# Patient Record
Sex: Female | Born: 1950 | Race: White | Hispanic: No | Smoking: Former smoker
Health system: Southern US, Community
[De-identification: ages and names within clinical notes are randomized; demographics above are authoritative.]

## PROBLEM LIST (undated history)

## (undated) DIAGNOSIS — K5792 Diverticulitis of intestine, part unspecified, without perforation or abscess without bleeding: Secondary | ICD-10-CM

## (undated) DIAGNOSIS — I739 Peripheral vascular disease, unspecified: Secondary | ICD-10-CM

## (undated) DIAGNOSIS — I5033 Acute on chronic diastolic (congestive) heart failure: Secondary | ICD-10-CM

## (undated) DIAGNOSIS — J449 Chronic obstructive pulmonary disease, unspecified: Secondary | ICD-10-CM

## (undated) DIAGNOSIS — Z902 Acquired absence of lung [part of]: Secondary | ICD-10-CM

## (undated) DIAGNOSIS — I10 Essential (primary) hypertension: Secondary | ICD-10-CM

## (undated) DIAGNOSIS — Z9289 Personal history of other medical treatment: Secondary | ICD-10-CM

## (undated) DIAGNOSIS — Z720 Tobacco use: Secondary | ICD-10-CM

## (undated) DIAGNOSIS — J45909 Unspecified asthma, uncomplicated: Secondary | ICD-10-CM

## (undated) DIAGNOSIS — E785 Hyperlipidemia, unspecified: Secondary | ICD-10-CM

## (undated) DIAGNOSIS — I214 Non-ST elevation (NSTEMI) myocardial infarction: Secondary | ICD-10-CM

## (undated) DIAGNOSIS — M797 Fibromyalgia: Secondary | ICD-10-CM

## (undated) DIAGNOSIS — G458 Other transient cerebral ischemic attacks and related syndromes: Secondary | ICD-10-CM

## (undated) DIAGNOSIS — N824 Other female intestinal-genital tract fistulae: Secondary | ICD-10-CM

## (undated) DIAGNOSIS — I639 Cerebral infarction, unspecified: Secondary | ICD-10-CM

## (undated) DIAGNOSIS — I2510 Atherosclerotic heart disease of native coronary artery without angina pectoris: Secondary | ICD-10-CM

## (undated) DIAGNOSIS — K567 Ileus, unspecified: Secondary | ICD-10-CM

## (undated) DIAGNOSIS — Z9889 Other specified postprocedural states: Secondary | ICD-10-CM

## (undated) DIAGNOSIS — K219 Gastro-esophageal reflux disease without esophagitis: Secondary | ICD-10-CM

## (undated) DIAGNOSIS — R9430 Abnormal result of cardiovascular function study, unspecified: Secondary | ICD-10-CM

## (undated) DIAGNOSIS — E119 Type 2 diabetes mellitus without complications: Secondary | ICD-10-CM

## (undated) DIAGNOSIS — F419 Anxiety disorder, unspecified: Secondary | ICD-10-CM

## (undated) DIAGNOSIS — R112 Nausea with vomiting, unspecified: Secondary | ICD-10-CM

## (undated) HISTORY — PX: CORONARY ANGIOPLASTY: SHX604

## (undated) HISTORY — DX: Non-ST elevation (NSTEMI) myocardial infarction: I21.4

## (undated) HISTORY — PX: BACK SURGERY: SHX140

## (undated) HISTORY — DX: Other transient cerebral ischemic attacks and related syndromes: G45.8

## (undated) HISTORY — DX: Acute on chronic diastolic (congestive) heart failure: I50.33

## (undated) HISTORY — PX: OTHER SURGICAL HISTORY: SHX169

## (undated) HISTORY — DX: Ileus, unspecified: K56.7

## (undated) HISTORY — DX: Other female intestinal-genital tract fistulae: N82.4

## (undated) HISTORY — PX: CARPAL TUNNEL RELEASE: SHX101

## (undated) HISTORY — PX: ABDOMINAL HYSTERECTOMY: SHX81

## (undated) HISTORY — PX: LUMBAR SPINE SURGERY: SHX701

---

## 1995-05-03 DIAGNOSIS — Z902 Acquired absence of lung [part of]: Secondary | ICD-10-CM

## 1995-05-03 HISTORY — DX: Acquired absence of lung (part of): Z90.2

## 1996-05-02 HISTORY — PX: OTHER SURGICAL HISTORY: SHX169

## 1997-09-24 ENCOUNTER — Ambulatory Visit (HOSPITAL_COMMUNITY): Admission: RE | Admit: 1997-09-24 | Discharge: 1997-09-24 | Payer: Self-pay | Admitting: Neurosurgery

## 2001-01-02 ENCOUNTER — Ambulatory Visit (HOSPITAL_COMMUNITY): Admission: RE | Admit: 2001-01-02 | Discharge: 2001-01-02 | Payer: Self-pay | Admitting: Family Medicine

## 2001-01-02 ENCOUNTER — Encounter: Payer: Self-pay | Admitting: Family Medicine

## 2002-05-02 DIAGNOSIS — G458 Other transient cerebral ischemic attacks and related syndromes: Secondary | ICD-10-CM

## 2002-05-02 HISTORY — DX: Other transient cerebral ischemic attacks and related syndromes: G45.8

## 2002-05-16 ENCOUNTER — Encounter: Payer: Self-pay | Admitting: Emergency Medicine

## 2002-05-16 ENCOUNTER — Inpatient Hospital Stay (HOSPITAL_COMMUNITY): Admission: EM | Admit: 2002-05-16 | Discharge: 2002-05-17 | Payer: Self-pay | Admitting: Emergency Medicine

## 2002-05-17 ENCOUNTER — Encounter: Payer: Self-pay | Admitting: Pediatrics

## 2002-06-07 ENCOUNTER — Ambulatory Visit (HOSPITAL_COMMUNITY): Admission: RE | Admit: 2002-06-07 | Discharge: 2002-06-07 | Payer: Self-pay | Admitting: Vascular Surgery

## 2002-06-10 ENCOUNTER — Inpatient Hospital Stay (HOSPITAL_COMMUNITY): Admission: RE | Admit: 2002-06-10 | Discharge: 2002-06-12 | Payer: Self-pay | Admitting: Vascular Surgery

## 2005-02-28 ENCOUNTER — Encounter: Admission: RE | Admit: 2005-02-28 | Discharge: 2005-02-28 | Payer: Self-pay | Admitting: Neurosurgery

## 2005-05-19 ENCOUNTER — Ambulatory Visit (HOSPITAL_COMMUNITY): Admission: RE | Admit: 2005-05-19 | Discharge: 2005-05-19 | Payer: Self-pay | Admitting: Family Medicine

## 2005-07-25 ENCOUNTER — Ambulatory Visit (HOSPITAL_COMMUNITY): Admission: RE | Admit: 2005-07-25 | Discharge: 2005-07-25 | Payer: Self-pay | Admitting: Family Medicine

## 2005-08-21 ENCOUNTER — Emergency Department (HOSPITAL_COMMUNITY): Admission: EM | Admit: 2005-08-21 | Discharge: 2005-08-21 | Payer: Self-pay | Admitting: Emergency Medicine

## 2006-05-19 ENCOUNTER — Inpatient Hospital Stay (HOSPITAL_COMMUNITY): Admission: EM | Admit: 2006-05-19 | Discharge: 2006-05-22 | Payer: Self-pay | Admitting: Emergency Medicine

## 2012-08-04 DIAGNOSIS — K5792 Diverticulitis of intestine, part unspecified, without perforation or abscess without bleeding: Secondary | ICD-10-CM

## 2012-08-04 HISTORY — DX: Diverticulitis of intestine, part unspecified, without perforation or abscess without bleeding: K57.92

## 2012-08-06 ENCOUNTER — Other Ambulatory Visit: Payer: Self-pay | Admitting: Physician Assistant

## 2012-08-06 DIAGNOSIS — I214 Non-ST elevation (NSTEMI) myocardial infarction: Secondary | ICD-10-CM

## 2012-08-07 ENCOUNTER — Other Ambulatory Visit: Payer: Self-pay | Admitting: Physician Assistant

## 2012-08-07 ENCOUNTER — Encounter (HOSPITAL_COMMUNITY): Payer: Self-pay | Admitting: Pulmonary Disease

## 2012-08-07 ENCOUNTER — Inpatient Hospital Stay (HOSPITAL_COMMUNITY): Payer: Medicare Other

## 2012-08-07 ENCOUNTER — Inpatient Hospital Stay (HOSPITAL_COMMUNITY)
Admission: AD | Admit: 2012-08-07 | Discharge: 2012-08-12 | DRG: 871 | Disposition: A | Discharge status: Home / Self Care | Payer: Medicare Other | Source: Other Acute Inpatient Hospital | Attending: Internal Medicine | Admitting: Internal Medicine

## 2012-08-07 DIAGNOSIS — Z880 Allergy status to penicillin: Secondary | ICD-10-CM

## 2012-08-07 DIAGNOSIS — N170 Acute kidney failure with tubular necrosis: Secondary | ICD-10-CM | POA: Diagnosis present

## 2012-08-07 DIAGNOSIS — Z8673 Personal history of transient ischemic attack (TIA), and cerebral infarction without residual deficits: Secondary | ICD-10-CM

## 2012-08-07 DIAGNOSIS — I369 Nonrheumatic tricuspid valve disorder, unspecified: Secondary | ICD-10-CM

## 2012-08-07 DIAGNOSIS — N179 Acute kidney failure, unspecified: Secondary | ICD-10-CM | POA: Diagnosis present

## 2012-08-07 DIAGNOSIS — N10 Acute tubulo-interstitial nephritis: Secondary | ICD-10-CM | POA: Diagnosis present

## 2012-08-07 DIAGNOSIS — I214 Non-ST elevation (NSTEMI) myocardial infarction: Secondary | ICD-10-CM | POA: Diagnosis present

## 2012-08-07 DIAGNOSIS — R6521 Severe sepsis with septic shock: Secondary | ICD-10-CM

## 2012-08-07 DIAGNOSIS — J4489 Other specified chronic obstructive pulmonary disease: Secondary | ICD-10-CM | POA: Diagnosis present

## 2012-08-07 DIAGNOSIS — D649 Anemia, unspecified: Secondary | ICD-10-CM | POA: Diagnosis present

## 2012-08-07 DIAGNOSIS — N1330 Unspecified hydronephrosis: Secondary | ICD-10-CM | POA: Diagnosis present

## 2012-08-07 DIAGNOSIS — R6520 Severe sepsis without septic shock: Secondary | ICD-10-CM | POA: Diagnosis present

## 2012-08-07 DIAGNOSIS — Z8711 Personal history of peptic ulcer disease: Secondary | ICD-10-CM

## 2012-08-07 DIAGNOSIS — E871 Hypo-osmolality and hyponatremia: Secondary | ICD-10-CM | POA: Diagnosis present

## 2012-08-07 DIAGNOSIS — Z9861 Coronary angioplasty status: Secondary | ICD-10-CM

## 2012-08-07 DIAGNOSIS — I248 Other forms of acute ischemic heart disease: Secondary | ICD-10-CM | POA: Diagnosis present

## 2012-08-07 DIAGNOSIS — R404 Transient alteration of awareness: Secondary | ICD-10-CM | POA: Diagnosis present

## 2012-08-07 DIAGNOSIS — I129 Hypertensive chronic kidney disease with stage 1 through stage 4 chronic kidney disease, or unspecified chronic kidney disease: Secondary | ICD-10-CM | POA: Diagnosis present

## 2012-08-07 DIAGNOSIS — E119 Type 2 diabetes mellitus without complications: Secondary | ICD-10-CM | POA: Diagnosis present

## 2012-08-07 DIAGNOSIS — I739 Peripheral vascular disease, unspecified: Secondary | ICD-10-CM | POA: Diagnosis present

## 2012-08-07 DIAGNOSIS — D638 Anemia in other chronic diseases classified elsewhere: Secondary | ICD-10-CM | POA: Diagnosis present

## 2012-08-07 DIAGNOSIS — Z902 Acquired absence of lung [part of]: Secondary | ICD-10-CM

## 2012-08-07 DIAGNOSIS — I2489 Other forms of acute ischemic heart disease: Secondary | ICD-10-CM | POA: Diagnosis present

## 2012-08-07 DIAGNOSIS — M545 Low back pain, unspecified: Secondary | ICD-10-CM | POA: Diagnosis present

## 2012-08-07 DIAGNOSIS — IMO0001 Reserved for inherently not codable concepts without codable children: Secondary | ICD-10-CM | POA: Diagnosis present

## 2012-08-07 DIAGNOSIS — Z79899 Other long term (current) drug therapy: Secondary | ICD-10-CM

## 2012-08-07 DIAGNOSIS — Z882 Allergy status to sulfonamides status: Secondary | ICD-10-CM

## 2012-08-07 DIAGNOSIS — I519 Heart disease, unspecified: Secondary | ICD-10-CM | POA: Diagnosis present

## 2012-08-07 DIAGNOSIS — N200 Calculus of kidney: Secondary | ICD-10-CM | POA: Diagnosis present

## 2012-08-07 DIAGNOSIS — J449 Chronic obstructive pulmonary disease, unspecified: Secondary | ICD-10-CM | POA: Diagnosis present

## 2012-08-07 DIAGNOSIS — N189 Chronic kidney disease, unspecified: Secondary | ICD-10-CM | POA: Diagnosis present

## 2012-08-07 DIAGNOSIS — G8929 Other chronic pain: Secondary | ICD-10-CM | POA: Diagnosis present

## 2012-08-07 DIAGNOSIS — E669 Obesity, unspecified: Secondary | ICD-10-CM | POA: Diagnosis present

## 2012-08-07 DIAGNOSIS — Z6830 Body mass index (BMI) 30.0-30.9, adult: Secondary | ICD-10-CM

## 2012-08-07 DIAGNOSIS — J9600 Acute respiratory failure, unspecified whether with hypoxia or hypercapnia: Secondary | ICD-10-CM | POA: Diagnosis present

## 2012-08-07 DIAGNOSIS — Z7982 Long term (current) use of aspirin: Secondary | ICD-10-CM

## 2012-08-07 DIAGNOSIS — A419 Sepsis, unspecified organism: Principal | ICD-10-CM | POA: Diagnosis present

## 2012-08-07 DIAGNOSIS — I2510 Atherosclerotic heart disease of native coronary artery without angina pectoris: Secondary | ICD-10-CM | POA: Diagnosis present

## 2012-08-07 DIAGNOSIS — K219 Gastro-esophageal reflux disease without esophagitis: Secondary | ICD-10-CM | POA: Diagnosis present

## 2012-08-07 DIAGNOSIS — F17200 Nicotine dependence, unspecified, uncomplicated: Secondary | ICD-10-CM | POA: Diagnosis present

## 2012-08-07 DIAGNOSIS — K5732 Diverticulitis of large intestine without perforation or abscess without bleeding: Secondary | ICD-10-CM | POA: Diagnosis present

## 2012-08-07 HISTORY — DX: Chronic obstructive pulmonary disease, unspecified: J44.9

## 2012-08-07 HISTORY — DX: Hyperlipidemia, unspecified: E78.5

## 2012-08-07 HISTORY — DX: Non-ST elevation (NSTEMI) myocardial infarction: I21.4

## 2012-08-07 HISTORY — DX: Tobacco use: Z72.0

## 2012-08-07 HISTORY — DX: Cerebral infarction, unspecified: I63.9

## 2012-08-07 HISTORY — DX: Essential (primary) hypertension: I10

## 2012-08-07 HISTORY — DX: Fibromyalgia: M79.7

## 2012-08-07 HISTORY — DX: Peripheral vascular disease, unspecified: I73.9

## 2012-08-07 HISTORY — DX: Atherosclerotic heart disease of native coronary artery without angina pectoris: I25.10

## 2012-08-07 HISTORY — DX: Acquired absence of lung (part of): Z90.2

## 2012-08-07 HISTORY — DX: Diverticulitis of intestine, part unspecified, without perforation or abscess without bleeding: K57.92

## 2012-08-07 LAB — RENAL FUNCTION PANEL
BUN: 58 mg/dL — ABNORMAL HIGH (ref 6–23)
CO2: 15 mEq/L — ABNORMAL LOW (ref 19–32)
Calcium: 7 mg/dL — ABNORMAL LOW (ref 8.4–10.5)
Creatinine, Ser: 4.69 mg/dL — ABNORMAL HIGH (ref 0.50–1.10)
GFR calc Af Amer: 10 mL/min — ABNORMAL LOW (ref >90)
Glucose, Bld: 95 mg/dL (ref 70–99)
Phosphorus: 7 mg/dL — ABNORMAL HIGH (ref 2.3–4.6)
Sodium: 133 mEq/L — ABNORMAL LOW (ref 135–145)

## 2012-08-07 LAB — CBC
HCT: 31.8 % — ABNORMAL LOW (ref 36.0–46.0)
Hemoglobin: 10.5 g/dL — ABNORMAL LOW (ref 12.0–15.0)
MCH: 31.5 pg (ref 26.0–34.0)
MCHC: 33 g/dL (ref 30.0–36.0)
MCV: 95.5 fL (ref 78.0–100.0)
RDW: 14.7 % (ref 11.5–15.5)

## 2012-08-07 LAB — SODIUM, URINE, RANDOM: Sodium, Ur: 53 mEq/L

## 2012-08-07 LAB — VANCOMYCIN, RANDOM: Vancomycin Rm: 15.8 ug/mL

## 2012-08-07 LAB — HEPARIN LEVEL (UNFRACTIONATED): Heparin Unfractionated: 0.19 IU/mL — ABNORMAL LOW (ref 0.30–0.70)

## 2012-08-07 MED ORDER — SODIUM CHLORIDE 0.45 % IV SOLN
INTRAVENOUS | Status: DC
  Administered 2012-08-07: 13:00:00 via INTRAVENOUS

## 2012-08-07 MED ORDER — IPRATROPIUM BROMIDE 0.02 % IN SOLN
0.5000 mg | Freq: Four times a day (QID) | RESPIRATORY_TRACT | Status: DC
  Administered 2012-08-07 – 2012-08-10 (×10): 0.5 mg via RESPIRATORY_TRACT
  Filled 2012-08-07 (×11): qty 2.5

## 2012-08-07 MED ORDER — INSULIN ASPART 100 UNIT/ML ~~LOC~~ SOLN: 0.0000 [IU] | SUBCUTANEOUS | Status: DC

## 2012-08-07 MED ORDER — SODIUM BICARBONATE 8.4 % IV SOLN
INTRAVENOUS | Status: DC
  Administered 2012-08-07 – 2012-08-08 (×2): via INTRAVENOUS
  Filled 2012-08-07 (×3): qty 150

## 2012-08-07 MED ORDER — SIMVASTATIN 20 MG PO TABS
20.0000 mg | ORAL_TABLET | Freq: Every day | ORAL | Status: DC
  Administered 2012-08-07 – 2012-08-11 (×5): 20 mg via ORAL
  Filled 2012-08-07 (×7): qty 1

## 2012-08-07 MED ORDER — ONDANSETRON HCL 4 MG/2ML IJ SOLN: 4.0000 mg | Freq: Four times a day (QID) | INTRAMUSCULAR | Status: DC | PRN

## 2012-08-07 MED ORDER — ASPIRIN EC 325 MG PO TBEC
325.0000 mg | DELAYED_RELEASE_TABLET | Freq: Every day | ORAL | Status: DC
  Administered 2012-08-08 – 2012-08-12 (×5): 325 mg via ORAL
  Filled 2012-08-07 (×5): qty 1

## 2012-08-07 MED ORDER — PANTOPRAZOLE SODIUM 40 MG IV SOLR
40.0000 mg | INTRAVENOUS | Status: DC
  Administered 2012-08-08: 40 mg via INTRAVENOUS
  Filled 2012-08-07 (×3): qty 40

## 2012-08-07 MED ORDER — ACETAMINOPHEN 325 MG PO TABS
650.0000 mg | ORAL_TABLET | ORAL | Status: DC | PRN
  Administered 2012-08-08 – 2012-08-10 (×6): 650 mg via ORAL
  Filled 2012-08-07 (×3): qty 2

## 2012-08-07 MED ORDER — HEPARIN (PORCINE) IN NACL 100-0.45 UNIT/ML-% IJ SOLN
2000.0000 [IU]/h | INTRAMUSCULAR | Status: DC
  Administered 2012-08-07: 1200 [IU]/h via INTRAVENOUS
  Administered 2012-08-08: 1600 [IU]/h via INTRAVENOUS
  Filled 2012-08-07 (×8): qty 250

## 2012-08-07 MED ORDER — SODIUM CHLORIDE 0.9 % IV SOLN
1.0000 g | INTRAVENOUS | Status: DC
  Administered 2012-08-07: 1 g via INTRAVENOUS
  Filled 2012-08-07 (×2): qty 1

## 2012-08-07 MED ORDER — MORPHINE SULFATE 2 MG/ML IJ SOLN
1.0000 mg | INTRAMUSCULAR | Status: DC | PRN
  Filled 2012-08-07: qty 1

## 2012-08-07 MED ORDER — METOPROLOL TARTRATE 25 MG PO TABS
25.0000 mg | ORAL_TABLET | Freq: Two times a day (BID) | ORAL | Status: DC
  Filled 2012-08-07 (×2): qty 1

## 2012-08-07 MED ORDER — ALBUTEROL SULFATE (5 MG/ML) 0.5% IN NEBU
2.5000 mg | INHALATION_SOLUTION | Freq: Four times a day (QID) | RESPIRATORY_TRACT | Status: DC
  Administered 2012-08-07 – 2012-08-10 (×10): 2.5 mg via RESPIRATORY_TRACT
  Filled 2012-08-07 (×11): qty 0.5

## 2012-08-07 MED ORDER — MORPHINE SULFATE 2 MG/ML IJ SOLN
1.0000 mg | INTRAMUSCULAR | Status: DC | PRN
  Administered 2012-08-07: 1 mg via INTRAVENOUS
  Administered 2012-08-08 – 2012-08-09 (×4): 2 mg via INTRAVENOUS
  Filled 2012-08-07 (×6): qty 1

## 2012-08-07 MED ORDER — NITROGLYCERIN 0.4 MG SL SUBL: 0.4000 mg | SUBLINGUAL_TABLET | SUBLINGUAL | Status: DC | PRN

## 2012-08-07 MED ORDER — CIPROFLOXACIN HCL 500 MG PO TABS
500.0000 mg | ORAL_TABLET | Freq: Two times a day (BID) | ORAL | Status: DC
  Filled 2012-08-07 (×2): qty 1

## 2012-08-07 MED ORDER — HEPARIN BOLUS VIA INFUSION
1500.0000 [IU] | Freq: Once | INTRAVENOUS | Status: AC
  Administered 2012-08-07: 1500 [IU] via INTRAVENOUS
  Filled 2012-08-07: qty 1500

## 2012-08-07 NOTE — H&P (Signed)
See Cardiology Consult Note dated 08/06/12 in the shadow chart. This is a 62 y/o F with history of CAD with remote angioplasty, history of CVA/TIA, DM, HTN, HL who was admitted at [facility] 08/05/12 with flank pain & acute renal failure and underwent recent placement of R ureteral stent for treatment of recurrent nephrolithiasis. She subsequently developed a hypoxemia requiring bipap. Cardiac markers showed elevated troponin of 3.3->4.1, with EKG showing some ST depression. She has had low blood pressures as well. She was started on ASA, heparin, BB as blood pressure allows, and statin. She was subsequently transferred to [facility] for ICU care including respiratory management, hypotension, and acute renal failure. She was started on antibiotics at [facility] before arrival for ?sepsis given hypotension, also ?of cardiac involvement. Patient received to 2100 in stable condition.  PCCM has been consulted for assistance. Renal consult requested - BUN/Cr 49/4.5 at [facility] today previously 34/3.3 (was 1.4 in the past). Per today's cardiology rounding note, her echo was done at [facility] and is pending at this time.  Orders written by the [facility] team that saw her today. Javionna Leder PA-C

## 2012-08-07 NOTE — Progress Notes (Signed)
Came to evaluate patient for possible delirium. Pt trying to get out of bed.  Vitals stable.  A&O to place and person. Describes pain in her back and discomfort in the bed.  A:Possible mild ICU delirium 2/2 multiple medical/surgical problems, recent urologic procedure and possible pain from it.  Plan: will give IV morphine- 1-2 mg q3 hrs PRN for pain. - If she gets worse- will consider BZD's as her Qtc is 488 on monitor.

## 2012-08-07 NOTE — Consult Note (Addendum)
Requesting Physician:  [facility] Reason for Consult:  Acute kidney injury HPI: The patient is a 62 y.o. year-old transferred from [facility] for further management of AKI and NSTEMI.  She has a background of NIDDM, HTN, hyperlipidemia, prior CVA'swith no residual, PAD with prior left carotid to subclavian anastamosis in 2004 [facility]) for subclavian steal.    She presented to [facility] on 08/05/12 with acute right flank pain and a creatinine on 1.4 (historical creatinines prior to this not known).  She was found to have right hydronephrosis and underwent placement of a right ureteral stent, bypassing 2 stones, with prurulent urine behind the stones.  Post procedure she developed respiratory distress requiring BIPAP, ECG changes, and troponin elevation consistent with NSTEMI.    She received several fluid boluses for hypotension.  Was on ARB which was discontinued.  Creatinine steadily rose despite ureteral stenting, and was up to 4.5 today, with a bicarb of 16, pH of 7.22, Hb down into the 9's from around 12.5, WBC up to 29,800 . Received several fluid boluses for oliguria and hypotension.  Was treated with 1 dose of vanco, rocephin for 2 days, meropenem and cipro.  No urine cultures were done. Transferred here for further management  Family states pt has never seen a urologist (although had appt last year with [facility] that she did not keep) or a nephrologist.   Past Medical History:  Past Medical History  Diagnosis Date  . CAD (coronary artery disease)   . PVD (peripheral vascular disease)   . CVA (cerebral infarction)     TIAx2  . HTN (hypertension)   . HLD (hyperlipidemia)   . Tobacco abuse   . Diverticulitis 08/04/12  . S/P partial lobectomy of lung 1997    per [name], incidental finding, path c/w benign lesion    Past Surgical History:  Past Surgical History  Procedure Laterality Date  . Hysterectomoy    . Lumbar spine surgery    . Carpal tunnel release      Family  History: No family history on file. Mother died when pt was 62 years old of some sort of kidney problem Social History:  reports that she has been smoking Cigarettes.  She has a 49 pack-year smoking history. She does not have any smokeless tobacco history on file. Her alcohol and drug histories are not on file.  Allergies:  Allergies  Allergen Reactions  . Penicillins Rash  . Sulfa Antibiotics Rash    Home medications: Prior to Admission medications   Medication Sig Start Date End Date Taking? Authorizing Provider  fenofibrate 160 MG tablet TAKE ONE TABLET BY MOUTH ONE TIME DAILY 08/06/12   Ileana Ladd, MD  Cozaar 100 mg/day ASA 325/day Omeprazole 20 mg/day Pravachol 40 mg Qhs  Inpatient medications: . albuterol  2.5 mg Nebulization Q6H  . [START ON 08/08/2012] aspirin EC  325 mg Oral Daily  . insulin aspart  0-9 Units Subcutaneous Q4H  . ipratropium  0.5 mg Nebulization Q6H  . meropenem (MERREM) IV  1 g Intravenous QAC supper  . metoprolol tartrate  25 mg Oral BID  . [START ON 08/08/2012] pantoprazole (PROTONIX) IV  40 mg Intravenous Q24H  . simvastatin  20 mg Oral q1800    Review of Systems Somewhat difficult due to patient discomfort Denies chills, dysuria or change in UOP PTA Flank pain present on adm to [facility] has improved  Dyspneic No abdominal pain, diarrhea or constipation No chronic or acute le swelling No tea or coca  colored or bloody urine  Physical Exam:  Blood pressure 93/64, pulse 56, temperature 97.4 F (36.3 C), temperature source Oral, height 5\' 1"  (1.549 m), weight 68.1 kg (150 lb 2.1 oz).  Gen: Uncomfortable app WF Somewhat hyperpneic Skin: warm, dry Neck veins difficult to visualize Chest: Expiratory wheezes, no definite crackles Heart: regular, s1s2 no audible s3 Abdomen: soft, no focal abdominal tenderness Ext: no appreciable LE edema Neuro: awake, oriented X 3  Keeps eyes close most of the time and doesn't make much eye contact  Labs - all  pending from this admission  From [facility] today: Na 136   K 5.0 Cl 107 CO2 16 BUN 49 Creatinine 4.5  (baseline 4/6 1.4) Lactic acid 1.9 Trop 4.11 WBC 29,800 Hb 9.7 Hct 30.5 Plts 131 ABG 4/7  7.27/102/31 (BIPAP 10/5 3 LPM)  Impression/Plan 62 y.o. year-old WF with a background of NIDDM, HTN, hyperlipidemia, prior CVA'swith no residual, PAD with prior left carotid to subclavian anastamosis in 2004 [facility]) for subclavian steal who presented to [facility] on 08/05/12 with acute right flank pain and a creatinine on 1.4 (historical creatinines prior to this not known).  Right hydronephrosis was treated with ureteral stenting,  bypassing 2 stones, with prurulent urine behind the stones.  Post procedure she developed respiratory distress requiring BIPAP, ECG changes, and troponin elevation consistent with NSTEMI, hypotension (sepsis vs cardiac).  Was on ARB PTA.  Has developed acute renal failure and metabolic acidosis in this setting with creatinine up to 4.5 today  AKI S/p ureteral stent for right hydro Baseline prior to procedures 1.4   More historical creatinines not known Hypotension, outpt ARB, acute NSTEMI with resp failure contributing to hemodynamic component Oliguric Intravascular volume difficult to assess Hyperpneic secondary to anion gap metabolic acidosis No indications as of yet for acute dialysis  REC: Renal ultrasound to assess whether stent has relieved obstruction Urine pro/creat/Na Change IVF to isotonic sodium bicarbonate May require RRT in the next 24 hours if UOP/acidosis/BP do not respond to aggressive Rx with fluids, bicarb, antibiotics. May need central line to assist with volume assessment/monitoring  Thanks for the consult. Will follow with you.   [name],  MD [facility] [phone] pager 08/07/2012, 2:51 PM

## 2012-08-07 NOTE — Progress Notes (Signed)
  Echocardiogram 2D Echocardiogram has been performed.  Georgian Co 08/07/2012, 4:07 PM

## 2012-08-07 NOTE — Progress Notes (Signed)
ANTICOAGULATION CONSULT NOTE - Initial Consult  Pharmacy Consult for Heparin Indication: chest pain/ACS  Allergies  Allergen Reactions  . Penicillins Rash  . Sulfa Antibiotics Rash    Patient Measurements: Height: 5\' 1"  (154.9 cm) Weight: 150 lb 2.1 oz (68.1 kg) IBW/kg (Calculated) : 47.8 Heparin Dosing Weight: 62.3 kg  Vital Signs: Temp: 97.4 F (36.3 C) (04/08 1300) Temp src: Oral (04/08 1300) BP: 91/75 mmHg (04/08 1238)  Labs: No results found for this basename: HGB, HCT, PLT, APTT, LABPROT, INR, HEPARINUNFRC, CREATININE, CKTOTAL, CKMB, TROPONINI,  in the last 72 hours  CrCl is unknown because no creatinine reading has been taken.   Medical History: No past medical history on file.  Medications:  Heparin @ 1200 units/hr  Assessment: 62 y.o. F transferred from [facility] to [facility] with heparin drip already infusing for CP/ACS sx. This morning the patient had a SUBtherapeutic heparin level of 0.19 and around 0830 orders were written for a heparin 1000 unit bolus x 1 followed by an increase in the drip rate to 1200 units/hr (12 ml/hr). Will continue the heparin drip at the current drip rate for now and will enter a heparin level to be drawn around 1700 today (~ 8 hours after bolus and adjustment in drip rate at [facility]).   Goal of Therapy:  Heparin level 0.3-0.7 units/ml Monitor platelets by anticoagulation protocol: Yes   Plan:  1. Continue heparin drip at current rate of 1200 units/hr (12 ml/hr) 2. Daily heparin levels 3. Will continue to monitor for any signs/symptoms of bleeding and will follow up with heparin level in 8 hours after the drip was increased this morning (around 1700 today)  Georgina Pillion, PharmD, BCPS Clinical Pharmacist Pager: [phone] 08/07/2012 1:30 PM

## 2012-08-07 NOTE — Consult Note (Addendum)
PULMONARY  / CRITICAL CARE MEDICINE  Name: Alexa Key MRN: [id number] DOB: 20-Jun-1950    ADMISSION DATE:  08/07/2012 CONSULTATION DATE:  08/07/2012  REFERRING MD :  [name] PRIMARY SERVICE: Bensimhon  CHIEF COMPLAINT:  Sepsis, renal failure  BRIEF PATIENT DESCRIPTION: 62 y/o F admitted to [facility] hospital on 4/5 with lower abdominal pain and UTI.  Found to have diverticulitis on abdominal CT and question of renal calculi and R hydronephrosis.  Underwent ureteral stent placement 4/7.  Post procedure was found to have hypoxia / resp distress requiring bipap, acute renal failure & elevated troponin c/w NSTEMI. Tx to [facility] on 4/8 in setting of NSTEMI, acute renal failure & resp distress.   SIGNIFICANT EVENTS / STUDIES:  4/05 - Admit to [facility] with proximal ureterolithiasis with renal colic 4/06 - CT ABD/Pelvis w/o>>> mild to mod R hydro, multiple calculi, largest 7mm 4/07 - underwent R ureteral stent via cystoscopy 4/08 - Tx to [facility] in setting of NSTEMI, resp fx  LINES / TUBES: PIV  CULTURES: (called [facility] 4/8 with no culture data drawn per lab tech) 4/8 BCx2>>> 4/8 UC>>>  ANTIBIOTICS:  PCN / SULFA ALLERGIC Rocephin 4/6>>>4/8 Vanco >> OSH Cipro 4/7>>>4/8  Meropenem 4/8>>>  HISTORY OF PRESENT ILLNESS:  62 y/o F, smoker, with PMH of HTN, DM, HLD, Fibromyalgia, CVAx2, Bleeding Ulcers, previously on prednisone in late 90's for chronic back pain / inflammatory response, and kidney stones admitted to [facility] on 4/5 with lower abdominal pain and UTI.  CT ABD performed with findings consistent with sigmoid diverticulitis, multiple renal calculi and R hydronephrosis.  Underwent ureteral stent placement 4/7 - noted pus behind obstruction.  Post procedure developed respiratory distress / hypoxia requiring bipap,  elevated troponin consistent with NSTEMI, hypotension and acute renal failure. Of note, required norepi during procedure due to hypotension.  Tx to [facility] on 4/8 in  setting of NSTEMI, acute renal failure & resp distress.     PAST MEDICAL HISTORY :  Past Medical History  Diagnosis Date  . CAD (coronary artery disease)   . PVD (peripheral vascular disease)   . CVA (cerebral infarction)     TIAx2  . HTN (hypertension)   . HLD (hyperlipidemia)   . Tobacco abuse   . Diverticulitis 08/04/12  . S/P partial lobectomy of lung 1997    per [name], incidental finding, path c/w benign lesion   Past Surgical History  Procedure Laterality Date  . Hysterectomoy    . Lumbar spine surgery    . Carpal tunnel release     Prior to Admission medications   Medication Sig Start Date End Date Taking? Authorizing Provider  fenofibrate 160 MG tablet TAKE ONE TABLET BY MOUTH ONE TIME DAILY 08/06/12   Ileana Ladd, MD   Allergies  Allergen Reactions  . Penicillins Rash  . Sulfa Antibiotics Rash    FAMILY HISTORY:  No family history on file. SOCIAL HISTORY:  reports that she has been smoking Cigarettes.  She has a 49 pack-year smoking history. She does not have any smokeless tobacco history on file. Her alcohol and drug histories are not on file.  REVIEW OF SYSTEMS:   Gen: Denies fever, chills, weight change, fatigue, night sweats HEENT: Denies blurred vision, double vision, hearing loss, tinnitus, sinus congestion, rhinorrhea, sore throat, neck stiffness, dysphagia PULM: Denies cough, sputum production, hemoptysis, wheezing.  Reports shortness of breath.  CV: Denies chest pain, edema, orthopnea, paroxysmal nocturnal dyspnea, palpitations GI: Denies nausea, vomiting, diarrhea, hematochezia, melena, constipation,  change in bowel habits.  Complains of abdominal pain, R flank area GU: Denies dysuria, hematuria, polyuria, oliguria, urethral discharge Endocrine: Denies hot or cold intolerance, polyuria, polyphagia or appetite change Derm: Denies rash, dry skin, scaling or peeling skin change Heme: Denies easy bruising, bleeding, bleeding gums Neuro: Denies headache,  numbness, weakness, slurred speech, loss of memory or consciousness   SUBJECTIVE:  C/o flank pain Denies SOB currently  VITAL SIGNS: Temp:  [97.4 F (36.3 C)] 97.4 F (36.3 C) (04/08 1300) Pulse Rate:  [56] 56 (04/08 1420) BP: (91-93)/(64-75) 93/64 mmHg (04/08 1420) Weight:  [150 lb 2.1 oz (68.1 kg)] 150 lb 2.1 oz (68.1 kg) (04/08 1300)  HEMODYNAMICS:    VENTILATOR SETTINGS:    INTAKE / OUTPUT: Intake/Output     04/07 0701 - 04/08 0700 04/08 0701 - 04/09 0700   I.V. (mL/kg)  324 (4.8)   Total Intake(mL/kg)  324 (4.8)   Urine (mL/kg/hr)  180   Total Output   180   Net   +144          PHYSICAL EXAMINATION: General:  wdwn adult female in NAD Neuro:  AAOx4, speech clear, MAE HEENT:  Mm pink/moist, short / thick neck Cardiovascular:  s1s2 rrr, no m/r/g Lungs:  Grunting respirations, lungs clear bilaterally  Abdomen:  Obese/soft, bsx4 active3 Musculoskeletal:  No acute deformities Skin:  Cool, clammy, no edema  LABS: [facility] Labs:  4/8:  Na 136, K 5.0, Cl 107, Cr 4.50, Glu 116, Lactic Acid 1.9 , CK 2966, BNP 1278, WBC 29.8, Hgb 9.7   No results found for this basename: HGB, WBC, PLT, NA, K, CL, CO2, GLUCOSE, BUN, CREATININE, CALCIUM, MG, PHOS, AST, ALT, ALKPHOS, BILITOT, PROT, ALBUMIN, APTT, INR, LATICACIDVEN, TROPONINI, PROCALCITON, PROBNP, O2SATVEN, PHART, PCO2ART, PO2ART,  in the last 168 hours No results found for this basename: GLUCAP,  in the last 168 hours   CXR: 4/8 - right hemi-diaphragm elevation (appears chronic), cardiomegaly, no acute infiltrate  ASSESSMENT / PLAN:  PULMONARY A: Acute Respiratory Distress - baseline tobacco abuse (49 pk yr hx).  Suspect transient decompensation 2/2 progressive ARF and metabolic disarray. Also NSTEMI, consider possible influence sedation given for renal stent placement, Required bipap at [facility].  Currently on [place] O2.  Presumed COPD based on tobacco hx Hx of RLL Lobectomy - per Dr. [name] in 1997 for benign  lesion Restrictive lung disease due to obesity and lobectomy P:   -O2 to support sats 90-95% -BiPap PRN, not requiring on admit -f/u CXR in am -PRN albuterol  CARDIOVASCULAR A:  Severe sepsis/Septic shock- Resolving, in setting NSTEMI, Source appears to be pyelonephritic/complicated UTI.  Noted intra-operative hypotension.  NSTEMI - positive troponin at [facility] 3.3->4.1 P:  -appreciate Cardiology assistance -ASA -no plavix with renal failure -restart Lopressor when BP can tolerate -full dose heparin gtt in setting of NSTEMI, will defer to Dr [name] regarding duration -likely needs L cath, unable at this time due to renal fxn -assess ECHO, EKG -assess cortisol  -abx as per ID section  RENAL A:   Hydronephrosis - secondary to recurrent calculi.  S/P R ureteral stent via cystoscopy 4/7 Acute Renal Failure - Concern for obstructive nephropathy + ATN in setting of ARB, hypotension Anion Gap Acidosis due to renal failure P:   -Nephrology Consult, may require HD this admission but no urgent need now -bicarb gtt 4/8 per nephrology recs -assess renal ultrasound in setting of rising S Cr despite ureteral stent.   -foley, I/O's  GASTROINTESTINAL A:  Sigmoid Diverticulitis - seen on CT at [facility] GERD Hx of Bleeding Ulcers  Chronic R elevated Hemi-Diaphragm P:   -protonix -heart healthy diet diet  HEMATOLOGIC A:   DVT Proph Anemia  P:  -full dose heparin as above  INFECTIOUS A:   Pyelo/UTI  Diverticulitis  P:   -changed to meropenem 4/8, total abx day #3 -assess urine culture (none drawn at [facility]) -assess vanco level as she was receiving at OSH  ENDOCRINE A:   DM    P:   -SSI  NEUROLOGIC A:   Hx of TIA  P:   -supportive care, no acute concerns    I have personally obtained a history, examined the patient, evaluated laboratory and imaging results, formulated the assessment and plan and placed orders.  CRITICAL CARE: The patient is critically  ill with multiple organ systems failure and requires high complexity decision making for assessment and support, frequent evaluation and titration of therapies, application of advanced monitoring technologies and extensive interpretation of multiple databases. Critical Care Time devoted to patient care services described in this note is 60 minutes.   Canary Brim, NP-C [facility] Pgr: [phone] or [phone]   08/07/2012, 2:38 PM  Levy Pupa, MD, PhD 08/07/2012, 3:28 PM [facility] [phone] or if no answer [phone]

## 2012-08-07 NOTE — Progress Notes (Signed)
ANTICOAGULATION CONSULT NOTE - Follow Up Consult  Pharmacy Consult for Heparin Indication: chest pain/ACS  Allergies  Allergen Reactions  . Penicillins Rash  . Sulfa Antibiotics Rash    Patient Measurements: Height: 5\' 1"  (154.9 cm) Weight: 150 lb 2.1 oz (68.1 kg) IBW/kg (Calculated) : 47.8 Heparin Dosing Weight: 62.3kg  Vital Signs: Temp: 97.4 F (36.3 C) (04/08 1521) Temp src: Axillary (04/08 1521) BP: 93/64 mmHg (04/08 1420) Pulse Rate: 56 (04/08 1420)  Labs:  Recent Labs  08/07/12 1625  HGB 10.5*  HCT 31.8*  PLT 144*  HEPARINUNFRC 0.19*  CREATININE 4.69*    Estimated Creatinine Clearance: 10.7 ml/min (by C-G formula based on Cr of 4.69).   Medications:  Heparin 1200 units/hr  Assessment: 62yof continues on heparin for suspected NSTEMI. Heparin level (0.19) remains subtherapeutic - will bolus and increase heparin drip rate. - Hg 10.5, Plts 144 - No significant bleeding reported - No problems with line/infusion per RN  Goal of Therapy:  Heparin level 0.3-0.7 units/ml Monitor platelets by anticoagulation protocol: Yes   Plan:  1. Heparin IV bolus 1500 units x 1 2. Increase heparin drip to 1400 units/hr (14 ml/hr) 3. Check heparin level 8 hours after rate increase   Cleon Dew [phone] 08/07/2012,5:40 PM

## 2012-08-08 ENCOUNTER — Inpatient Hospital Stay (HOSPITAL_COMMUNITY): Payer: Medicare Other

## 2012-08-08 ENCOUNTER — Encounter (HOSPITAL_COMMUNITY): Payer: Self-pay | Admitting: Nurse Practitioner

## 2012-08-08 LAB — URINALYSIS, ROUTINE W REFLEX MICROSCOPIC
Glucose, UA: NEGATIVE mg/dL
Nitrite: NEGATIVE
Specific Gravity, Urine: 1.014 (ref 1.005–1.030)
pH: 5 (ref 5.0–8.0)

## 2012-08-08 LAB — CBC WITH DIFFERENTIAL/PLATELET
Eosinophils Absolute: 0.1 10*3/uL (ref 0.0–0.7)
Eosinophils Relative: 0 % (ref 0–5)
HCT: 29.1 % — ABNORMAL LOW (ref 36.0–46.0)
Lymphocytes Relative: 6 % — ABNORMAL LOW (ref 12–46)
Lymphs Abs: 1.6 10*3/uL (ref 0.7–4.0)
MCH: 31.4 pg (ref 26.0–34.0)
MCV: 94.2 fL (ref 78.0–100.0)
Monocytes Absolute: 0.7 10*3/uL (ref 0.1–1.0)
Platelets: 141 10*3/uL — ABNORMAL LOW (ref 150–400)
RBC: 3.09 MIL/uL — ABNORMAL LOW (ref 3.87–5.11)
RDW: 14.4 % (ref 11.5–15.5)
WBC: 26.7 10*3/uL — ABNORMAL HIGH (ref 4.0–10.5)

## 2012-08-08 LAB — BASIC METABOLIC PANEL
CO2: 17 mEq/L — ABNORMAL LOW (ref 19–32)
Calcium: 6.6 mg/dL — ABNORMAL LOW (ref 8.4–10.5)
Chloride: 97 mEq/L (ref 96–112)
Creatinine, Ser: 4.79 mg/dL — ABNORMAL HIGH (ref 0.50–1.10)
Glucose, Bld: 96 mg/dL (ref 70–99)

## 2012-08-08 LAB — GLUCOSE, CAPILLARY
Glucose-Capillary: 100 mg/dL — ABNORMAL HIGH (ref 70–99)
Glucose-Capillary: 99 mg/dL (ref 70–99)
Glucose-Capillary: 102 mg/dL — ABNORMAL HIGH (ref 70–99)

## 2012-08-08 LAB — RENAL FUNCTION PANEL
Albumin: 2.3 g/dL — ABNORMAL LOW (ref 3.5–5.2)
CO2: 21 mEq/L (ref 19–32)
Chloride: 95 mEq/L — ABNORMAL LOW (ref 96–112)
Creatinine, Ser: 4.58 mg/dL — ABNORMAL HIGH (ref 0.50–1.10)
GFR calc Af Amer: 11 mL/min — ABNORMAL LOW (ref >90)
GFR calc non Af Amer: 9 mL/min — ABNORMAL LOW (ref >90)
Potassium: 4.3 mEq/L (ref 3.5–5.1)
Sodium: 129 mEq/L — ABNORMAL LOW (ref 135–145)

## 2012-08-08 LAB — LIPID PANEL
Cholesterol: 85 mg/dL (ref 0–200)
Total CHOL/HDL Ratio: 21.3 RATIO
VLDL: 64 mg/dL — ABNORMAL HIGH (ref 0–40)

## 2012-08-08 LAB — PROCALCITONIN: Procalcitonin: 159.42 ng/mL

## 2012-08-08 LAB — HEPARIN LEVEL (UNFRACTIONATED): Heparin Unfractionated: 0.38 IU/mL (ref 0.30–0.70)

## 2012-08-08 LAB — URINE CULTURE

## 2012-08-08 LAB — URINE MICROSCOPIC-ADD ON

## 2012-08-08 LAB — CORTISOL: Cortisol, Plasma: 23.6 ug/dL

## 2012-08-08 MED ORDER — CALCIUM ACETATE 667 MG PO CAPS
667.0000 mg | ORAL_CAPSULE | Freq: Three times a day (TID) | ORAL | Status: DC
  Administered 2012-08-08 – 2012-08-10 (×5): 667 mg via ORAL
  Filled 2012-08-08 (×9): qty 1

## 2012-08-08 MED ORDER — HEPARIN BOLUS VIA INFUSION
2000.0000 [IU] | Freq: Once | INTRAVENOUS | Status: AC
  Administered 2012-08-08: 2000 [IU] via INTRAVENOUS
  Filled 2012-08-08: qty 2000

## 2012-08-08 MED ORDER — SODIUM CHLORIDE 0.9 % IV SOLN
500.0000 mg | INTRAVENOUS | Status: DC
  Administered 2012-08-08 – 2012-08-09 (×2): 500 mg via INTRAVENOUS
  Filled 2012-08-08 (×5): qty 0.5

## 2012-08-08 MED ORDER — ASPIRIN 325 MG PO TABS
325.0000 mg | ORAL_TABLET | Freq: Every day | ORAL | Status: DC
  Filled 2012-08-08 (×2): qty 1

## 2012-08-08 MED ORDER — SODIUM BICARBONATE 8.4 % IV SOLN
INTRAVENOUS | Status: DC
  Administered 2012-08-08 – 2012-08-09 (×2): via INTRAVENOUS
  Filled 2012-08-08 (×4): qty 50

## 2012-08-08 MED FILL — Heparin Sodium (Porcine) 100 Unt/ML in Sodium Chloride 0.45%: INTRAMUSCULAR | Qty: 250 | Status: AC

## 2012-08-08 NOTE — Progress Notes (Signed)
ANTICOAGULATION CONSULT NOTE - Follow Up Consult  Pharmacy Consult for Heparin Indication: chest pain/ACS  Allergies  Allergen Reactions  . Penicillins Rash  . Sulfa Antibiotics Rash    Patient Measurements: Height: 5\' 1"  (154.9 cm) Weight: 161 lb (73.029 kg) IBW/kg (Calculated) : 47.8 Heparin Dosing Weight: 62.3, kg  Vital Signs: Temp: 98.2 F (36.8 C) (04/09 2023) Temp src: Axillary (04/09 2023) BP: 130/60 mmHg (04/09 2023) Pulse Rate: 61 (04/09 2045)  Labs:  Recent Labs  08/07/12 1625 08/08/12 0147 08/08/12 1030 08/08/12 2105  HGB 10.5* 9.7*  --   --   HCT 31.8* 29.1*  --   --   PLT 144* 141*  --   --   HEPARINUNFRC 0.19* 0.21* 0.14* 0.38  CREATININE 4.69* 4.79* 4.58*  --     Estimated Creatinine Clearance: 11.3 ml/min (by C-G formula based on Cr of 4.58).   Medications:  Heparin @ 1800 units/hr  Assessment: 62 y.o. F who continues on heparin for ACS sx while awaiting further cardiology evaluation. Heparin level tonight is now therapeutic after a rate increase this afternoon (HL 0.38, goal of 0.3-0.7). Hgb/Hct/Plt slight drop this am. No overt s/sx of bleeding noted. Heparin infusing at the appropriate rate.   Goal of Therapy:  Heparin level 0.3-0.7 units/ml Monitor platelets by anticoagulation protocol: Yes   Plan:  1. Continue heparin drip rate to 1800 units/hr (18 ml/hr) 2. Will continue to monitor for any signs/symptoms of bleeding, continue daily labs  Sheppard Coil PharmD., BCPS Clinical Pharmacist Pager [phone] 08/08/2012 10:02 PM

## 2012-08-08 NOTE — Progress Notes (Signed)
Subjective:  States she feels much better today No pain and not SOB (although still somewhat hyperpneic d/t acidosis)  Objective Vital signs in last 24 hours: Filed Vitals:   08/08/12 0700 08/08/12 0723 08/08/12 0800 08/08/12 0900  BP: 106/54  103/59 137/88  Pulse: 55  55 56  Temp:   97.9 F (36.6 C)   TempSrc:   Oral   Resp: 16  15 16   Height:      Weight:      SpO2: 99% 99% 98% 98%   Weight change:   Intake/Output Summary (Last 24 hours) at 08/08/12 1007 Last data filed at 08/08/12 0800  Gross per 24 hour  Intake   2057 ml  Output    401 ml  Net   1656 ml   Physical Exam:  Blood pressure 137/88, pulse 56, temperature 97.9 F (36.6 C), temperature source Oral, resp. rate 16, height 5\' 1"  (1.549 m), weight 68.1 kg (150 lb 2.1 oz), SpO2 98.00%. More awake and alert Mild hyperpnea No rales No pericardial rub Abdomen soft No CVAT No edema 100 cc yellow urine with sediment in the foley  Labs: Basic Metabolic Panel:  Recent Labs Lab 08/07/12 1625 08/08/12 0147  NA 133* 131*  K 4.9 4.5  CL 101 97  CO2 15* 17*  GLUCOSE 95 96  BUN 58* 64*  CREATININE 4.69* 4.79*  CALCIUM 7.0* 6.6*  PHOS 7.0*  --    Liver Function Tests:  Recent Labs Lab 08/07/12 1625  ALBUMIN 2.3*   No results found for this basename: LIPASE, AMYLASE,  in the last 168 hours No results found for this basename: AMMONIA,  in the last 168 hours CBC:  Recent Labs Lab 08/07/12 1625 08/08/12 0147  WBC 29.4* 26.7*  NEUTROABS  --  24.3*  HGB 10.5* 9.7*  HCT 31.8* 29.1*  MCV 95.5 94.2  PLT 144* 141*  CBG:  Recent Labs Lab 08/07/12 1517 08/07/12 2059 08/08/12 0019 08/08/12 0352 08/08/12 0759  GLUCAP 84 100* 98 106* 99  Results for LANNY, DONOSO (MRN [id number]) as of 08/08/2012 09:58  Ref. Range 08/07/2012 15:00 08/07/2012 17:53  PROTEIN CREATININE RATIO Latest Range: 0.00-0.15   4.45 (H)   Studies/Results: US Renal Port  08/07/2012  *RADIOLOGY REPORT*  Clinical Data:  Recent right  renal stent placement  RENAL/URINARY TRACT ULTRASOUND COMPLETE  Comparison:  None.  Findings:  Right Kidney:  Measures 11.6 cm.  Normal in size and parenchymal echogenicity.  There is a 13 mm stone within the mid pole collecting system of the right kidney.  Right-sided Nephro ureteral stent is in place without evidence for hydronephrosis.  Left Kidney:  Left kidney measures 11.2 cm. A left- sidedpelvocaliectasis noted without evidence for hydronephrosis.  7 mm stone is noted within the midpole.  Bladder:  Collapsed around a Foley catheter balloon.  Other:  Ascites noted in the right lower quadrant.  IMPRESSION:  1.  No evidence for recurrent hydronephrosis after placement of right ureteral stent   Original Report Authenticated By: Signa Kell, M.D.    Dg Chest Port 1 View  08/08/2012  *RADIOLOGY REPORT*  Clinical Data: Shortness of breath, possible edema  PORTABLE CHEST - 1 VIEW  Comparison: Portable chest x-ray of 08/06/2012  Findings: Volume loss at the right lung base and blunting of the right costophrenic angle appears stable most consistent with pleuroparenchymal scarring.  The left lung is clear.  Cardiomegaly is stable.  No acute bony abnormality is seen.  IMPRESSION: Stable cardiomegaly.  Stable scarring at the right lung base.   Original Report Authenticated By: Dwyane Dee, M.D.    Medications: . heparin 1,600 Units/hr (08/08/12 0718)  .  sodium bicarbonate  infusion 1000 mL 75 mL/hr at 08/08/12 0518   . albuterol  2.5 mg Nebulization Q6H  . aspirin EC  325 mg Oral Daily  . insulin aspart  0-9 Units Subcutaneous Q4H  . ipratropium  0.5 mg Nebulization Q6H  . meropenem (MERREM) IV  500 mg Intravenous Q24H  . pantoprazole (PROTONIX) IV  40 mg Intravenous Q24H  . simvastatin  20 mg Oral q1800   . heparin 1,600 Units/hr (08/08/12 0718)  .  sodium bicarbonate  infusion 1000 mL 75 mL/hr at 08/08/12 0518    I  have reviewed scheduled and prn medications.  ASSESSMENT/RECOMMENDATIONS  62  y.o. year-old WF with a background of NIDDM, HTN, hyperlipidemia, prior CVA'swith no residual, PAD with prior left carotid to subclavian anastamosis in 2004 [facility]) for subclavian steal who presented to [facility] on 08/05/12 with acute right flank pain and a creatinine on 1.4 (historical creatinines prior to this not known). Right hydronephrosis was treated with ureteral stenting, bypassing 2 stones, with prurulent urine behind the stones. Post procedure developed respiratory distress requiring BIPAP, ECG changes, and troponin elevation consistent with NSTEMI, hypotension (sepsis vs cardiac). On ARB PTA. Has developed acute renal failure and metabolic acidosis in this setting with creatinine up to 4.5 on admission  AKI  S/p ureteral stent for right hydro  UPC 4.4 gm c/w diab disease Baseline prior to procedures 1.4  More historical creatinines not known  Hypotension, outpt ARB, acute NSTEMI with resp failure contributing to hemodynamic component of AKI FeNA 1.6 c/w ATN Urine output picking up a little Hyperpneic secondary to anion gap metabolic acidosis  No indications for acute dialysis yet Potassium and CO2 improving Repeat renal panel  Metabolic acidosis Continue isotonic bicarb at 75 cc/hour  Mild hyponatremia Likely some free water excess No specific Rx required at this time (Bicarb drip is isotonic so not contributing)  Hyperphosphatemia, hypocalcemia Add phoslo 667 TID with meals If renal recovers should not need long term  Right renal stones with obstruction s/p ureteral stent at outside hosp 4/6 Renal US resolved hydro  Presumed urosepsis On antibiotics (meropenem) Cultures pending (none done prior to ATB's at outside hospital)  NSTEMI Heparin Per cards  Hypotension Resolved No pressors required  Resp distress/COPD/H/O RLL lobectomy/restrictive lung ds Appears stable pulm wise  Anemia Progressive since admit to outside hospital (Hb 12.5 on adm) Check iron studies  and stools   [name], MD [facility] [phone] pager 08/08/2012, 10:07 AM

## 2012-08-08 NOTE — Progress Notes (Signed)
PULMONARY  / CRITICAL CARE MEDICINE  Name: Alexa Key MRN: [id number] DOB: [birth year]    ADMISSION DATE:  08/07/2012 CONSULTATION DATE:  08/07/2012  REFERRING MD :  [name] PRIMARY SERVICE: Bensimhon  CHIEF COMPLAINT:  Sepsis, renal failure  BRIEF PATIENT DESCRIPTION: 62 y/o F admitted to [facility] hospital on 4/5 with lower abdominal pain and UTI.  Found to have diverticulitis on abdominal CT and question of renal calculi and R hydronephrosis.  Underwent ureteral stent placement 4/7.  Post procedure was found to have hypoxia / resp distress requiring bipap, acute renal failure & elevated troponin c/w NSTEMI. Tx to [facility] on 4/8 in setting of NSTEMI, acute renal failure & resp distress.   SIGNIFICANT EVENTS / STUDIES:  4/05 - Admit to [facility] with proximal ureterolithiasis with renal colic 4/06 - CT ABD/Pelvis w/o>>> mild to mod R hydro, multiple calculi, largest 7mm 4/07 - underwent R ureteral stent via cystoscopy 4/08 - Tx to [facility] in setting of NSTEMI, resp fx 4/09 - Persistent oliguria, renal US with no evidence of hydronephrosis after placement of right ureteral stent  LINES / TUBES: PIV  CULTURES: (called [facility] 4/8 with no culture data drawn per lab tech) 4/8 BCx2>>> 4/8 UC>>> PCR neg MRSA  ANTIBIOTICS:  PCN / SULFA ALLERGIC Rocephin 4/6>>>4/8 Vanco >> OSH Cipro 4/7>>>4/8  Meropenem 4/8>>>  HISTORY OF PRESENT ILLNESS:  62 y/o F, smoker, with PMH of HTN, DM, HLD, Fibromyalgia, CVAx2, Bleeding Ulcers, previously on prednisone in late 90's for chronic back pain / inflammatory response, and kidney stones admitted to [facility] on 4/5 with lower abdominal pain and UTI.  CT ABD performed with findings consistent with sigmoid diverticulitis, multiple renal calculi and R hydronephrosis.  Underwent ureteral stent placement 4/7 - noted pus behind obstruction.  Post procedure developed respiratory distress / hypoxia requiring bipap,  elevated troponin consistent with  NSTEMI, hypotension and acute renal failure. Of note, required norepi during procedure due to hypotension.  Tx to [facility] on 4/8 in setting of NSTEMI, acute renal failure & resp distress.   SUBJECTIVE:  C/o flank pain Denies SOB currently Has chronic low back pain that improved with morphine Dis not require BiPAP overnight UOP increasing last 24 h  VITAL SIGNS: Temp:  [97.4 F (36.3 C)-98.1 F (36.7 C)] 97.9 F (36.6 C) (04/09 0800) Pulse Rate:  [44-113] 55 (04/09 0800) Resp:  [13-22] 15 (04/09 0800) BP: (82-122)/(39-90) 103/59 mmHg (04/09 0800) SpO2:  [92 %-100 %] 98 % (04/09 0800) Weight:  [150 lb 2.1 oz (68.1 kg)] 150 lb 2.1 oz (68.1 kg) (04/08 1300)  HEMODYNAMICS:    VENTILATOR SETTINGS:    INTAKE / OUTPUT: Intake/Output     04/08 0701 - 04/09 0700 04/09 0701 - 04/10 0700   P.O. 30    I.V. (mL/kg) 1836 (27) 91 (1.3)   IV Piggyback 100    Total Intake(mL/kg) 1966 (28.9) 91 (1.3)   Urine (mL/kg/hr) 340 60 (0.6)   Stool  1 (0)   Total Output 340 61   Net +1626 +30          PHYSICAL EXAMINATION: General:  wdwn adult female in NAD Neuro:  Alert and oriented x3, speech clear HEENT:  Mm pink/moist, short / thick neck Cardiovascular:  s1s2 rrr, no m/r/g Lungs:  Bilateral lung crackles up to her mid lung fields, shallow breaths, poor respiratory effort but no increase WOB Abdomen:  Obese/soft, bsx4 active3 Musculoskeletal:  No acute deformities Skin:  Warn, no edema  LABS:  Recent Labs Lab 08/07/12 1508 08/07/12 1625 08/08/12 0147  HGB  --  10.5* 9.7*  WBC  --  29.4* 26.7*  PLT  --  144* 141*  NA  --  133* 131*  K  --  4.9 4.5  CL  --  101 97  CO2  --  15* 17*  GLUCOSE  --  95 96  BUN  --  58* 64*  CREATININE  --  4.69* 4.79*  CALCIUM  --  7.0* 6.6*  PHOS  --  7.0*  --   ALBUMIN  --  2.3*  --   PROCALCITON 177.04  --  159.42    Recent Labs Lab 08/07/12 1517 08/07/12 2059 08/08/12 0019 08/08/12 0352 08/08/12 0759  GLUCAP 84 100* 98 106* 99      CXR: 4/8 - right hemi-diaphragm elevation (appears chronic), cardiomegaly, no acute infiltrate  ASSESSMENT / PLAN:  PULMONARY A: Acute Respiratory Distress - baseline tobacco abuse (49 pk yr hx).  Suspect transient decompensation 2/2 progressive ARF and metabolic disarray. Also NSTEMI, consider possible influence sedation given for renal stent placement, Required bipap at [facility].  Pulse Ox 98% on 2L Vera Presumed COPD based on tobacco hx Hx of RLL Lobectomy - per Dr. [name] in 1997 for benign lesion Restrictive lung disease due to obesity and lobectomy P:   -O2 to support sats 90-95% -BiPap PRN, not requiring since admit -f/u CXR -PRN albuterol -bronchodilators q6h  CARDIOVASCULAR A:  Severe sepsis/Septic shock- Resolving, in setting NSTEMI, Source appears to be pyelonephritic/complicated UTI.  Noted intra-operative hypotension.  NSTEMI - positive troponin at [facility] 3.3->4.1. EKG with no q waves, non- specific T wave flattening. 2D echo pending 4/9 ?CAD: likely needs L cath, unable at this time due to renal fxn. LDL of 17, TG 321, TChol 85 P:  -ASA, full dose -Morphine PRN -full dose heparin gtt in setting of NSTEMI, will defer to Dr [name] regarding duration -no plavix with renal failure -restart Lopressor when BP can tolerate, will defer to cardiology  RENAL A:   Hydronephrosis - Resolved. Was secondary to recurrent calculi.  S/P R ureteral stent via cystoscopy 4/7- Renal US with no recurrent hydronephrosis Oliguria- Likely 2/2 to ARF, pyelo- Resolving, UOP increasing this AM Acute Renal Failure - Concern for obstructive nephropathy + ATN in setting of ARB, hypotension- FeNa 1.6%, with uNa of 53, likely intrinsic Anion Gap Acidosis due to renal failure Hyponatremia - Na trended down to 131, likely 2/2 aggressive hydration P:   -Nephrology Consult, may require HD this admission but no urgent need- UOP increasing to ~0.47ml/kg/hr this AM but Cr and BUN trending up  (probably nearing plateau)_ -Continue bicarb gtt per Nephrology, decrease rate to 50-cc/h -foley, I/O's -UA pending -f/u repeat renal function panel  GASTROINTESTINAL A:   Sigmoid Diverticulitis - seen on CT at [facility]- NO abdominal pain, NO signs of bleeding GERD Hx of Bleeding Ulcers  Chronic R elevated Hemi-Diaphragm P:   -protonix -heart healthy diet diet  HEMATOLOGIC A:   DVT Proph Anemia - Normocytic. Likely anemia of chronic disease. Hg drop but stable after aggressive hydration, NO overt bleed.  P:  -full dose heparin as above  INFECTIOUS A:   Pyelo/UTI - s/p R ureteral stent via cystoscopy 4/7- Renal US with no recurrent hydronephrosis -Vanc stopped but level still therapeutic on 4/8  Diverticulitis  Leucocytosis- WBC trending down, no fever Procalcitonin trending down P:   -Continue meropenem, consider narrow next 24h -UA ordered (but after Abx)  ENDOCRINE A:   DM Cortisol 23 P:   -SSI  NEUROLOGIC A:   Hx of TIA  P:   -supportive care, no acute concerns  Today's summary: Continue bicarb drip per Nephrology, UOP slowly increasing. Reassess volume status and cardiac function with recent NSTEMI. Appreciate Cardiology recommendations.   Transfer to telemetry bed 4/9 Levy Pupa, MD, PhD 08/08/2012, 8:24 AM [facility] [phone] or if no answer [phone]

## 2012-08-08 NOTE — Progress Notes (Addendum)
ANTICOAGULATION CONSULT NOTE - Follow Up Consult  Pharmacy Consult for Heparin Indication: chest pain/ACS  Allergies  Allergen Reactions  . Penicillins Rash  . Sulfa Antibiotics Rash    Patient Measurements: Height: 5\' 1"  (154.9 cm) Weight: 150 lb 2.1 oz (68.1 kg) IBW/kg (Calculated) : 47.8 Heparin Dosing Weight: 62.3, kg  Vital Signs: Temp: 97.9 F (36.6 C) (04/09 0800) Temp src: Oral (04/09 0800) BP: 128/65 mmHg (04/09 1100) Pulse Rate: 62 (04/09 1100)  Labs:  Recent Labs  08/07/12 1625 08/08/12 0147 08/08/12 1030  HGB 10.5* 9.7*  --   HCT 31.8* 29.1*  --   PLT 144* 141*  --   HEPARINUNFRC 0.19* 0.21* 0.14*  CREATININE 4.69* 4.79* 4.58*    Estimated Creatinine Clearance: 11 ml/min (by C-G formula based on Cr of 4.58).   Medications:  Heparin @ 1600 units/hr  Assessment: 62 y.o. F who continues on heparin for ACS sx while awaiting further cardiology evaluation. Heparin level this afternoon is SUBtherapeutic after a rate increase this morning (HL 0.14, goal of 0.3-0.7). Hgb/Hct/Plt slight drop. No overt s/sx of bleeding noted. Heparin infusing at the appropriate rate.   The patient is also on Meropenem for empiric UTI/pyelo coverage. SCr 4.58, CrCl~10 ml/min, UOP low. Will adjust Meropenem dose today for reduced renal function.  Goal of Therapy:  Heparin level 0.3-0.7 units/ml Monitor platelets by anticoagulation protocol: Yes   Plan:  1. Heparin bolus of 2000 units x 1 2. Increase heparin drip rate to 1800 units/hr (18 ml/hr) 3. Adjust Meropenem to 500 mg IV every 24 hours 4. Will continue to monitor for any signs/symptoms of bleeding and will follow up with heparin level in 8 hours   Georgina Pillion, PharmD, BCPS Clinical Pharmacist Pager: [phone] 08/08/2012 1:09 PM

## 2012-08-08 NOTE — Progress Notes (Signed)
ANTICOAGULATION CONSULT NOTE Pharmacy Consult for Heparin Indication: chest pain/ACS  Allergies  Allergen Reactions  . Penicillins Rash  . Sulfa Antibiotics Rash    Patient Measurements: Height: 5\' 1"  (154.9 cm) Weight: 150 lb 2.1 oz (68.1 kg) IBW/kg (Calculated) : 47.8 Heparin Dosing Weight: 62.3kg  Vital Signs: Temp: 97.9 F (36.6 C) (04/09 0020) Temp src: Oral (04/09 0020) BP: 89/42 mmHg (04/09 0212) Pulse Rate: 54 (04/09 0212)  Labs:  Recent Labs  08/07/12 1625 08/08/12 0147  HGB 10.5* 9.7*  HCT 31.8* 29.1*  PLT 144* 141*  HEPARINUNFRC 0.19* 0.21*  CREATININE 4.69* 4.79*    Estimated Creatinine Clearance: 10.5 ml/min (by C-G formula based on Cr of 4.79).  Assessment: 62 yo female with NSTEMI for heparin  Goal of Therapy:  Heparin level 0.3-0.7 units/ml Monitor platelets by anticoagulation protocol: Yes   Plan:  Heparin 2000 units IV bolus, then increase heparin 1600 units/hr Check heparin level in 6 hours.   Eddie Candle 08/08/2012,3:10 AM

## 2012-08-09 ENCOUNTER — Inpatient Hospital Stay (HOSPITAL_COMMUNITY): Payer: Medicare Other

## 2012-08-09 LAB — URINE CULTURE: Colony Count: NO GROWTH

## 2012-08-09 LAB — CBC WITH DIFFERENTIAL/PLATELET
Basophils Relative: 0 % (ref 0–1)
Eosinophils Absolute: 0 10*3/uL (ref 0.0–0.7)
Eosinophils Relative: 0 % (ref 0–5)
HCT: 28.8 % — ABNORMAL LOW (ref 36.0–46.0)
Hemoglobin: 9.7 g/dL — ABNORMAL LOW (ref 12.0–15.0)
MCH: 30.9 pg (ref 26.0–34.0)
MCHC: 33.7 g/dL (ref 30.0–36.0)
MCV: 91.7 fL (ref 78.0–100.0)
Monocytes Absolute: 1.2 10*3/uL — ABNORMAL HIGH (ref 0.1–1.0)
Monocytes Relative: 7 % (ref 3–12)

## 2012-08-09 LAB — CBC
HCT: 28.4 % — ABNORMAL LOW (ref 36.0–46.0)
Hemoglobin: 9.6 g/dL — ABNORMAL LOW (ref 12.0–15.0)
MCH: 31.3 pg (ref 26.0–34.0)
MCHC: 33.8 g/dL (ref 30.0–36.0)

## 2012-08-09 LAB — RENAL FUNCTION PANEL
CO2: 26 mEq/L (ref 19–32)
Calcium: 7 mg/dL — ABNORMAL LOW (ref 8.4–10.5)
Creatinine, Ser: 3.69 mg/dL — ABNORMAL HIGH (ref 0.50–1.10)
GFR calc non Af Amer: 12 mL/min — ABNORMAL LOW (ref >90)

## 2012-08-09 LAB — HEPARIN LEVEL (UNFRACTIONATED): Heparin Unfractionated: 0.22 IU/mL — ABNORMAL LOW (ref 0.30–0.70)

## 2012-08-09 MED ORDER — HEPARIN SODIUM (PORCINE) 5000 UNIT/ML IJ SOLN
5000.0000 [IU] | Freq: Three times a day (TID) | INTRAMUSCULAR | Status: DC
  Administered 2012-08-09 – 2012-08-12 (×9): 5000 [IU] via SUBCUTANEOUS
  Filled 2012-08-09 (×12): qty 1

## 2012-08-09 MED ORDER — PANTOPRAZOLE SODIUM 40 MG PO TBEC
40.0000 mg | DELAYED_RELEASE_TABLET | Freq: Every day | ORAL | Status: DC
  Administered 2012-08-09 – 2012-08-12 (×4): 40 mg via ORAL
  Filled 2012-08-09 (×4): qty 1

## 2012-08-09 NOTE — Progress Notes (Signed)
ANTICOAGULATION CONSULT NOTE - Follow Up Consult  Pharmacy Consult for heparin Indication: chest pain/ACS  Labs:  Recent Labs  08/07/12 1625 08/08/12 0147 08/08/12 1030 08/08/12 2105 08/09/12 0540  HGB 10.5* 9.7*  --   --  9.6*  HCT 31.8* 29.1*  --   --  28.4*  PLT 144* 141*  --   --  143*  HEPARINUNFRC 0.19* 0.21* 0.14* 0.38 0.22*  CREATININE 4.69* 4.79* 4.58*  --   --     Assessment: 63yo female now subtherapeutic on heparin after one level at goal; no gtt issues per RN.  Goal of Therapy:  Heparin level 0.3-0.7 units/ml   Plan:  Will increase heparin gtt by 2-3 units/kg/hr to 2000 units/hr and check level in 8hr.  Vernard Gambles, PharmD, BCPS  08/09/2012,6:59 AM

## 2012-08-09 NOTE — Progress Notes (Addendum)
TELEMETRY: Reviewed telemetry pt in NSR: Filed Vitals:   08/08/12 2045 08/09/12 0236 08/09/12 0404 08/09/12 0952  BP:   132/60   Pulse: 61 62 75   Temp:   98.4 F (36.9 C)   TempSrc:   Oral   Resp: 19 21    Height:      Weight:   163 lb (73.936 kg)   SpO2: 99% 99% 98% 98%    Intake/Output Summary (Last 24 hours) at 08/09/12 1644 Last data filed at 08/09/12 1000  Gross per 24 hour  Intake      0 ml  Output   1350 ml  Net  -1350 ml    SUBJECTIVE Patient transferred from [facility] 08/07/12 with septic shock, ARF, and respiratory failure. Troponin elevated to 4 consistent with NSTEMI. Patient denies any chest pain. Breathing well today. Patient reports she is passing gas from her vagina.  LABS: Basic Metabolic Panel:  Recent Labs  14/78/29 1030 08/09/12 0540  NA 129* 139  K 4.3 4.7  CL 95* 101  CO2 21 26  GLUCOSE 100* 105*  BUN 69* 64*  CREATININE 4.58* 3.69*  CALCIUM 6.5* 7.0*  PHOS 7.2* 6.7*   Liver Function Tests:  Recent Labs  08/08/12 1030 08/09/12 0540  ALBUMIN 2.3* 2.3*   CBC:  Recent Labs  08/08/12 0147 08/09/12 0540 08/09/12 1446  WBC 26.7* 17.5* 15.8*  NEUTROABS 24.3*  --  13.4*  HGB 9.7* 9.6* 9.7*  HCT 29.1* 28.4* 28.8*  MCV 94.2 92.5 91.7  PLT 141* 143* 121*   Fasting Lipid Panel:  Recent Labs  08/08/12 0147  CHOL 85  HDL 4*  LDLCALC 17  TRIG 321*  CHOLHDL 21.3   TRadiology/Studies:  Dg Chest 1 View  08/09/2012  *RADIOLOGY REPORT*  Clinical Data: Weakness and shortness of breath.  Evaluate for pulmonary edema.  CHEST - 1 VIEW  Comparison: Chest x-ray 08/08/2012.  Findings: Mild elevation of the right hemidiaphragm is unchanged. Bibasilar opacities (right greater than left), favored to predominately reflect subsegmental atelectasis.  Small right pleural effusion appears slightly increased compared to the prior study. There is cephalization of the pulmonary vasculature and slight indistinctness of the interstitial markings  suggestive of mild pulmonary edema.  Mild cardiomegaly. The patient is rotated to the right on today's exam, resulting in distortion of the mediastinal contours and reduced diagnostic sensitivity and specificity for mediastinal pathology.  Atherosclerosis in the thoracic aorta.  IMPRESSION: 1.  Cardiomegaly with mild interstitial pulmonary edema. 2.  Bibasilar opacities favored to reflect subsegmental atelectasis.  Small right pleural effusion and chronic elevation of the right hemidiaphragm.   Original Report Authenticated By: Trudie Reed, M.D.    US Renal Port  08/07/2012  *RADIOLOGY REPORT*  Clinical Data:  Recent right renal stent placement  RENAL/URINARY TRACT ULTRASOUND COMPLETE  Comparison:  None.  Findings:  Right Kidney:  Measures 11.6 cm.  Normal in size and parenchymal echogenicity.  There is a 13 mm stone within the mid pole collecting system of the right kidney.  Right-sided Nephro ureteral stent is in place without evidence for hydronephrosis.  Left Kidney:  Left kidney measures 11.2 cm. A left- sidedpelvocaliectasis noted without evidence for hydronephrosis.  7 mm stone is noted within the midpole.  Bladder:  Collapsed around a Foley catheter balloon.  Other:  Ascites noted in the right lower quadrant.  IMPRESSION:  1.  No evidence for recurrent hydronephrosis after placement of right ureteral stent   Original Report Authenticated By:  Signa Kell, M.D.    Dg Chest Port 1 View  08/08/2012  *RADIOLOGY REPORT*  Clinical Data: Shortness of breath, possible edema  PORTABLE CHEST - 1 VIEW  Comparison: Portable chest x-ray of 08/06/2012  Findings: Volume loss at the right lung base and blunting of the right costophrenic angle appears stable most consistent with pleuroparenchymal scarring.  The left lung is clear.  Cardiomegaly is stable.  No acute bony abnormality is seen.  IMPRESSION: Stable cardiomegaly.  Stable scarring at the right lung base.   Original Report Authenticated By: Dwyane Dee, M.D.     Echo shows EF 50-55% without regional wall motion abnormality. Valves OK.  PHYSICAL EXAM General: Well developed, well nourished, in no acute distress. Head: Normal Neck: Negative for carotid bruits. JVD not elevated. Lungs: Clear Heart: RRR S1 S2 without murmurs, rubs, or gallops.  Abdomen: Soft, non-tender, non-distended with normoactive bowel sounds. No hepatomegaly.  Extremities: No edema.  Distal pedal pulses are 2+ and equal bilaterally. Neuro: Alert and oriented X 3.   ASSESSMENT AND PLAN: 1. NSTEMI type 2 related to septic shock, hypotension, and ARF. No symptoms of angina. Normal LV function. Patient apparently had a remote PCI. I am unable to locate records of this. No further cardiac evaluation recommended this admission. She is a poor candidate for cardiac cath with renal failure. Consider nuclear stress testing later as an outpatient once acute medical problems resolved. Can DC IV heparin and switch to Sq dose for DVT prophylaxis. Not a candidate for beta blocker due to low pulse. Continue ASA.   2. Acute on chronic renal failure  3. UTI/pyelonephritis s/p ureteral stent.  4. Diverticulitis.   Active Problems:   Pyelonephritis, acute   Septic shock(785.52)   NSTEMI (non-ST elevated myocardial infarction)   Acute respiratory failure   Acute renal failure    Signed, Shaheen Star [place] MD,FACC 08/09/2012 4:54 PM

## 2012-08-09 NOTE — Progress Notes (Addendum)
PULMONARY  / CRITICAL CARE MEDICINE  Name: Alexa Key MRN: [id number] DOB: 21-Nov-1950    ADMISSION DATE:  08/07/2012 CONSULTATION DATE:  08/07/2012  REFERRING MD :  Bensimhon PRIMARY SERVICE: PCCM  CHIEF COMPLAINT:  Sepsis, renal failure  BRIEF PATIENT DESCRIPTION: 62 y/o F admitted to [facility] hospital on 4/5 with lower abdominal pain and UTI.  Found to have diverticulitis on abdominal CT and question of renal calculi and R hydronephrosis.  Underwent ureteral stent placement 4/7.  Post procedure was found to have hypoxia / resp distress requiring bipap, acute renal failure & elevated troponin c/w NSTEMI. Tx to [facility] on 4/8 in setting of NSTEMI, acute renal failure & resp distress.   SIGNIFICANT EVENTS / STUDIES:  4/05 - Admit to [facility] with proximal ureterolithiasis with renal colic 4/06 - CT ABD/Pelvis w/o>>> mild to mod R hydro, multiple calculi, largest 7mm 4/07 - underwent R ureteral stent via cystoscopy 4/08 - Tx to [facility] in setting of NSTEMI, resp fx 4/09 - Persistent oliguria, renal US with no evidence of hydronephrosis after placement of right ureteral stent  LINES / TUBES: PIV  CULTURES: (called [facility] 4/8 with no culture data drawn per lab tech) 4/8 BCx2>>> 4/8 UC>>> negative 4/9 UC>>>negative 4/8 PCR neg MRSA  ANTIBIOTICS:  PCN / SULFA ALLERGIC Rocephin 4/6>>>4/8 Vanco >> OSH Cipro 4/7>>>4/8  Meropenem 4/8>>>  SUBJECTIVE:  Denies acute complaints.  Reports no urinary sx.  Afebrile.   VITAL SIGNS: Temp:  [97.6 F (36.4 C)-98.4 F (36.9 C)] 98.4 F (36.9 C) (04/10 0404) Pulse Rate:  [60-75] 75 (04/10 0404) Resp:  [19-22] 21 (04/10 0236) BP: (123-132)/(60-65) 132/60 mmHg (04/10 0404) SpO2:  [98 %-99 %] 98 % (04/10 0952) Weight:  [161 lb (73.029 kg)-163 lb (73.936 kg)] 163 lb (73.936 kg) (04/10 0404)   INTAKE / OUTPUT: Intake/Output     04/09 0701 - 04/10 0700 04/10 0701 - 04/11 0700   P.O. 120    I.V. (mL/kg) 625.4 (8.5)    IV Piggyback      Total Intake(mL/kg) 745.4 (10.1)    Urine (mL/kg/hr) 350 (0.2) 1350 (2.6)   Stool 1 (0)    Total Output 351 1350   Net +394.4 -1350          PHYSICAL EXAMINATION: General:  wdwn adult female in NAD Neuro:  Alert and oriented x3, speech clear HEENT:  Mm pink/moist, short / thick neck Cardiovascular:  s1s2 rrr, no m/r/g Lungs:  resp's even/non-labored, lungs bilaterally clear anterior, few faint crackles lower posterior Abdomen:  Obese/soft, bsx4 active Musculoskeletal:  No acute deformities Skin:  Warn, no edema  LABS:   Recent Labs Lab 08/07/12 1508  08/07/12 1625 08/08/12 0147 08/08/12 1030 08/09/12 0540  HGB  --   --  10.5* 9.7*  --  9.6*  WBC  --   --  29.4* 26.7*  --  17.5*  PLT  --   --  144* 141*  --  143*  NA  --   < > 133* 131* 129* 139  K  --   < > 4.9 4.5 4.3 4.7  CL  --   < > 101 97 95* 101  CO2  --   < > 15* 17* 21 26  GLUCOSE  --   < > 95 96 100* 105*  BUN  --   < > 58* 64* 69* 64*  CREATININE  --   < > 4.69* 4.79* 4.58* 3.69*  CALCIUM  --   < >  7.0* 6.6* 6.5* 7.0*  PHOS  --   --  7.0*  --  7.2* 6.7*  ALBUMIN  --   --  2.3*  --  2.3* 2.3*  PROCALCITON >175.00  --   --  159.42  --   --   < > = values in this interval not displayed.  Recent Labs Lab 08/07/12 2059 08/08/12 0019 08/08/12 0352 08/08/12 0759 08/08/12 1122  GLUCAP 100* 98 106* 99 102*   CXR: 4/10 - rotated, small R effusion / atx, cardiomegaly   ASSESSMENT / PLAN:  PULMONARY A: Acute Respiratory Distress - baseline tobacco abuse (49 pk yr hx).  Suspect transient decompensation 2/2 progressive ARF and metabolic disarray. Also NSTEMI, consider possible influence sedation given for renal stent placement, Required bipap at [facility].  Pulse Ox 98% on 2L [place].  Resolved (4/9) Presumed COPD based on tobacco hx Hx of RLL Lobectomy - per Dr. [name] in 1997 for benign lesion Restrictive lung disease due to obesity and lobectomy  P:   -O2 to support sats 90-95% -f/u CXR -bronchodilators  q6  CARDIOVASCULAR A:  Severe sepsis/Septic shock - Resolving.  Source appears to be pyelonephritic /complicated UTI.  Noted intra-operative hypotension.  NSTEMI - positive troponin at [facility] 3.3->4.1. EKG with no q waves, non- specific T wave flattening. 2D echo pending 4/9 ?CAD: likely needs L cath, unable at this time due to renal fxn. LDL of 17, TG 321, TChol 85 Chronic diastolic dysfunction.  P:  -ASA, full dose -Morphine PRN -full dose heparin gtt in setting of NSTEMI, will defer to Dr [name] regarding duration -no plavix with renal failure -defer adding beta blocker to cardiology  RENAL A:   Hydronephrosis - Resolved. Was secondary to recurrent calculi.  S/P R ureteral stent via cystoscopy 4/7- Renal US with no recurrent hydronephrosis Oliguria- Likely 2/2 to ARF, pyelo- Resolving, UOP increasing this AM Acute Renal Failure - Concern for obstructive nephropathy + ATN in setting of ARB, hypotension - FeNa 1.6%, with uNa of 53, likely intrinsic Anion Gap Acidosis due to renal failure  P:   -Nephrology following -foley, I/O's -trend sr cr  GASTROINTESTINAL A:   Sigmoid Diverticulitis - seen on CT at [facility]- NO abdominal pain, NO signs of bleeding GERD Hx of Bleeding Ulcers  Chronic R elevated Hemi-Diaphragm  P:   -protonix -heart healthy diet diet  HEMATOLOGIC A:   DVT Proph Anemia - Normocytic. Likely anemia of chronic disease. Hg drop but stable after aggressive hydration, No overt bleed.   P:  -full dose heparin as above  INFECTIOUS A:   Pyelo/UTI - s/p R ureteral stent via cystoscopy 4/7- Renal US with no recurrent hydronephrosis  Diverticulitis - noted on CT abd from [facility] Leucocytosis - WBC trending down, no fever.  PCT trending down  P:   -Continue meropenem, consider narrow oral abx next 24h -->consider cipro as culture negative -UA ordered on admit (but after Abx)  ENDOCRINE A:   DM  P:   -SSI  NEUROLOGIC A:   Hx of TIA  P:    -supportive care, no acute concerns    PCCM will transfer to [facility] for am 4/11 0700.     Canary Brim, NP-C [facility] Pgr: [phone] or [phone]   Reviewed above, examined pt, and agree with assessment/plan.  [facility] to assume care from 4/11 and PCCM sign off.  Coralyn Helling, MD [facility] Pulmonary/Critical Care 08/09/2012, 3:03 PM Pager:  [phone] After 3pm call: [phone]

## 2012-08-09 NOTE — Progress Notes (Signed)
Subjective:  Feeling much better No pain SOB "about like usual for me"  Objective Vital signs in last 24 hours: Filed Vitals:   08/08/12 2023 08/08/12 2045 08/09/12 0236 08/09/12 0404  BP: 130/60   132/60  Pulse: 62 61 62 75  Temp: 98.2 F (36.8 C)   98.4 F (36.9 C)  TempSrc: Axillary   Oral  Resp:  19 21   Height:      Weight:    73.936 kg (163 lb)  SpO2: 99% 99% 99% 98%   Weight change: 4.929 kg (10 lb 13.9 oz)  1300 of urine in bag, 350 in urinometer right now Has not actually been recorded in the computer since yesterday at 1PM  Physical Exam:  Blood pressure 137/88, pulse 56, temperature 97.9 F (36.6 C), temperature source Oral, resp. rate 16, height 5\' 1"  (1.549 m), weight 68.1 kg (150 lb 2.1 oz), SpO2 98.00%. Awake and alert Lungs grossly clear ant 1/6 murmur ULSB no radiation Abdomen soft without tenderness 1+edema LE's  Labs: Basic Metabolic Panel:  Recent Labs Lab 08/07/12 1625 08/08/12 0147 08/08/12 1030 08/09/12 0540  NA 133* 131* 129* 139  K 4.9 4.5 4.3 4.7  CL 101 97 95* 101  CO2 15* 17* 21 26  GLUCOSE 95 96 100* 105*  BUN 58* 64* 69* 64*  CREATININE 4.69* 4.79* 4.58* 3.69*  CALCIUM 7.0* 6.6* 6.5* 7.0*  PHOS 7.0*  --  7.2* 6.7*   Liver Function Tests:  Recent Labs Lab 08/07/12 1625 08/08/12 1030 08/09/12 0540  ALBUMIN 2.3* 2.3* 2.3*   Recent Labs Lab 08/07/12 1625 08/08/12 0147 08/09/12 0540  WBC 29.4* 26.7* 17.5*  NEUTROABS  --  24.3*  --   HGB 10.5* 9.7* 9.6*  HCT 31.8* 29.1* 28.4*  MCV 95.5 94.2 92.5  PLT 144* 141* 143*  CBG:  Recent Labs Lab 08/07/12 2059 08/08/12 0019 08/08/12 0352 08/08/12 0759 08/08/12 1122  GLUCAP 100* 98 106* 99 102*   Results for Alexa Key, HITCH (MRN [id number]) as of 08/08/2012 09:58  Ref. Range 08/07/2012 15:00 08/07/2012 17:53  PROTEIN CREATININE RATIO Latest Range: 0.00-0.15   4.45 (H)   Studies/Results: US Renal Port  08/07/2012  *RADIOLOGY REPORT*  Clinical Data:  Recent right renal stent  placement  RENAL/URINARY TRACT ULTRASOUND COMPLETE  Comparison:  None.  Findings:  Right Kidney:  Measures 11.6 cm.  Normal in size and parenchymal echogenicity.  There is a 13 mm stone within the mid pole collecting system of the right kidney.  Right-sided Nephro ureteral stent is in place without evidence for hydronephrosis.  Left Kidney:  Left kidney measures 11.2 cm. A left- sidedpelvocaliectasis noted without evidence for hydronephrosis.  7 mm stone is noted within the midpole.  Bladder:  Collapsed around a Foley catheter balloon.  Other:  Ascites noted in the right lower quadrant.  IMPRESSION:  1.  No evidence for recurrent hydronephrosis after placement of right ureteral stent   Original Report Authenticated By: Signa Kell, M.D.    Dg Chest Port 1 View  08/08/2012  *RADIOLOGY REPORT*  Clinical Data: Shortness of breath, possible edema  PORTABLE CHEST - 1 VIEW  Comparison: Portable chest x-ray of 08/06/2012  Findings: Volume loss at the right lung base and blunting of the right costophrenic angle appears stable most consistent with pleuroparenchymal scarring.  The left lung is clear.  Cardiomegaly is stable.  No acute bony abnormality is seen.  IMPRESSION: Stable cardiomegaly.  Stable scarring at the right lung base.  Original Report Authenticated By: Dwyane Dee, M.D.    Medications: . heparin 2,000 Units/hr (08/09/12 0700)  .  sodium bicarbonate  infusion 1000 mL 75 mL/hr at 08/09/12 0500   . albuterol  2.5 mg Nebulization Q6H  . aspirin EC  325 mg Oral Daily  . aspirin  325 mg Oral Daily  . calcium acetate  667 mg Oral TID WC  . ipratropium  0.5 mg Nebulization Q6H  . meropenem (MERREM) IV  500 mg Intravenous Q24H  . pantoprazole (PROTONIX) IV  40 mg Intravenous Q24H  . simvastatin  20 mg Oral q1800   . heparin 2,000 Units/hr (08/09/12 0700)  .  sodium bicarbonate  infusion 1000 mL 75 mL/hr at 08/09/12 0500    I  have reviewed scheduled and prn  medications.  ASSESSMENT/RECOMMENDATIONS  62 y.o. year-old WF with a background of NIDDM, HTN, hyperlipidemia, prior CVA'swith no residual, PAD with prior left carotid to subclavian anastamosis in 2004 [facility]) for subclavian steal who presented to [facility] on 08/05/12 with acute right flank pain and a creatinine on 1.4 (historical creatinines prior to this not known). Right hydronephrosis was treated with ureteral stenting, bypassing 2 stones, with prurulent urine behind the stones. Post procedure developed respiratory distress requiring BIPAP, ECG changes, and troponin elevation consistent with NSTEMI, hypotension (sepsis vs cardiac). On ARB PTA. Has developed acute renal failure and metabolic acidosis in this setting with creatinine up to 4.5 on admission  1.  AKI multifactorial as above (sepsis, hypotension, obstruction, contrast, ARB, in diabetic pt)  S/p ureteral stent for right hydro d/t stones  UPC 4.4 gm c/w diab disease Baseline prior to procedures 1.4  More historical creatinines not known  Hypotension, outpt ARB, acute NSTEMI with resp failure contributing to hemodynamic component of AKI Initial FeNA 1.6 c/w ATN Creatinine starting to decline UOP clearly better - just not getting recorded - I spoke with her nurse  2.  Right renal stones with obstruction s/p ureteral stent at outside hosp 4/6 Renal US resolved hydro  3.  Presumed urosepsis On antibiotics (meropenem) Cultures pending (none done prior to ATB's at outside hospital) [facility] neg  4.  NSTEMI EF 50-55 Heparin Per cards Anticipate cath once renal recovery  5.  Hypotension/shock Resolved No pressors required  6.  Metabolic acidosis Resolved Stop bicarb drip  7.  Mild hyponatremia Resolved  8.  Hyperphosphatemia, hypocalcemia On Phoslo 667 TID with meals If renal recovers should not need long term  9.  Resp distress/COPD/H/O RLL lobectomy/restrictive lung ds Appears stable pulm wise  10.  Anemia Progressive  since admit to outside hospital (Hb 12.5 on adm) but stabilizing Check iron studies and stools   [name], MD [facility] [phone] pager 08/09/2012, 9:52 AM

## 2012-08-09 NOTE — Progress Notes (Signed)
ANTICOAGULATION CONSULT NOTE - Follow Up Consult  Pharmacy Consult for heparin Indication: chest pain/ACS  Allergies  Allergen Reactions  . Penicillins Rash  . Sulfa Antibiotics Rash    Patient Measurements: Height: 5\' 1"  (154.9 cm) Weight: 163 lb (73.936 kg) IBW/kg (Calculated) : 47.8 Heparin Dosing Weight:   Vital Signs: Temp: 98.4 F (36.9 C) (04/10 0404) Temp src: Oral (04/10 0404) BP: 132/60 mmHg (04/10 0404) Pulse Rate: 75 (04/10 0404)  Labs:  Recent Labs  08/08/12 0147 08/08/12 1030 08/08/12 2105 08/09/12 0540 08/09/12 1446  HGB 9.7*  --   --  9.6* 9.7*  HCT 29.1*  --   --  28.4* 28.8*  PLT 141*  --   --  143* 121*  HEPARINUNFRC 0.21* 0.14* 0.38 0.22* 0.41  CREATININE 4.79* 4.58*  --  3.69*  --     Estimated Creatinine Clearance: 14.2 ml/min (by C-G formula based on Cr of 3.69).   Medications:  Scheduled:  . albuterol  2.5 mg Nebulization Q6H  . aspirin EC  325 mg Oral Daily  . calcium acetate  667 mg Oral TID WC  . ipratropium  0.5 mg Nebulization Q6H  . meropenem (MERREM) IV  500 mg Intravenous Q24H  . pantoprazole  40 mg Oral Daily  . simvastatin  20 mg Oral q1800  . [DISCONTINUED] aspirin  325 mg Oral Daily  . [DISCONTINUED] pantoprazole (PROTONIX) IV  40 mg Intravenous Q24H   Infusions:  . heparin 2,000 Units/hr (08/09/12 0700)  . [DISCONTINUED]  sodium bicarbonate  infusion 1000 mL Stopped (08/09/12 1015)    Assessment: 62 yo female with ACS is currently on therapeutic heparin.  Heparin level is 0.41.   Goal of Therapy:  Heparin level 0.3-0.7 units/ml Monitor platelets by anticoagulation protocol: Yes   Plan:  1) Continue heparin at 2000 units/hr 2) Heparin level in am  Gilbert Manolis, Tsz-Yin 08/09/2012,3:56 PM

## 2012-08-10 LAB — RENAL FUNCTION PANEL
Albumin: 2.2 g/dL — ABNORMAL LOW (ref 3.5–5.2)
BUN: 50 mg/dL — ABNORMAL HIGH (ref 6–23)
Creatinine, Ser: 2.26 mg/dL — ABNORMAL HIGH (ref 0.50–1.10)
Phosphorus: 4.6 mg/dL (ref 2.3–4.6)

## 2012-08-10 LAB — CBC
HCT: 29.9 % — ABNORMAL LOW (ref 36.0–46.0)
MCH: 30.9 pg (ref 26.0–34.0)
MCV: 94.3 fL (ref 78.0–100.0)
Platelets: 139 10*3/uL — ABNORMAL LOW (ref 150–400)
RDW: 14.4 % (ref 11.5–15.5)

## 2012-08-10 MED ORDER — SODIUM CHLORIDE 0.9 % IV SOLN
500.0000 mg | Freq: Two times a day (BID) | INTRAVENOUS | Status: AC
  Administered 2012-08-10 – 2012-08-11 (×4): 500 mg via INTRAVENOUS
  Filled 2012-08-10 (×4): qty 0.5

## 2012-08-10 MED ORDER — ALBUTEROL SULFATE (5 MG/ML) 0.5% IN NEBU: 2.5000 mg | INHALATION_SOLUTION | RESPIRATORY_TRACT | Status: DC | PRN

## 2012-08-10 MED ORDER — METOPROLOL TARTRATE 25 MG PO TABS
25.0000 mg | ORAL_TABLET | Freq: Two times a day (BID) | ORAL | Status: DC
  Administered 2012-08-10 – 2012-08-12 (×5): 25 mg via ORAL
  Filled 2012-08-10 (×7): qty 1

## 2012-08-10 MED ORDER — IPRATROPIUM BROMIDE 0.02 % IN SOLN: 0.5000 mg | RESPIRATORY_TRACT | Status: DC | PRN

## 2012-08-10 NOTE — Progress Notes (Signed)
SUBJECTIVE:  No chest pain.  No SOB   PHYSICAL EXAM Filed Vitals:   08/09/12 0952 08/09/12 1949 08/09/12 2115 08/10/12 0510  BP:  131/94  180/82  Pulse:  79  74  Temp:  98.5 F (36.9 C)  97.9 F (36.6 C)  TempSrc:  Oral  Oral  Resp:      Height:      Weight:    159 lb (72.122 kg)  SpO2: 98% 98% 97% 99%   General:  No distress Lungs:  Clear Heart:  RRR Abdomen:  Positive bowel sounds, no rebound no guarding Extremities:  No edema  LABS: No results found for this basename: CKTOTAL, CKMB, CKMBINDEX, TROPONINI   Results for orders placed during the hospital encounter of 08/07/12 (from the past 24 hour(s))  HEPARIN LEVEL (UNFRACTIONATED)     Status: None   Collection Time    08/09/12  2:46 PM      Result Value Range   Heparin Unfractionated 0.41  0.30 - 0.70 IU/mL  CBC WITH DIFFERENTIAL     Status: Abnormal   Collection Time    08/09/12  2:46 PM      Result Value Range   WBC 15.8 (*) 4.0 - 10.5 K/uL   RBC 3.14 (*) 3.87 - 5.11 MIL/uL   Hemoglobin 9.7 (*) 12.0 - 15.0 g/dL   HCT 65.7 (*) 84.6 - 96.2 %   MCV 91.7  78.0 - 100.0 fL   MCH 30.9  26.0 - 34.0 pg   MCHC 33.7  30.0 - 36.0 g/dL   RDW 95.2  84.1 - 32.4 %   Platelets 121 (*) 150 - 400 K/uL   Neutrophils Relative 85 (*) 43 - 77 %   Neutro Abs 13.4 (*) 1.7 - 7.7 K/uL   Lymphocytes Relative 8 (*) 12 - 46 %   Lymphs Abs 1.2  0.7 - 4.0 K/uL   Monocytes Relative 7  3 - 12 %   Monocytes Absolute 1.2 (*) 0.1 - 1.0 K/uL   Eosinophils Relative 0  0 - 5 %   Eosinophils Absolute 0.0  0.0 - 0.7 K/uL   Basophils Relative 0  0 - 1 %   Basophils Absolute 0.0  0.0 - 0.1 K/uL  CBC     Status: Abnormal   Collection Time    08/10/12  5:43 AM      Result Value Range   WBC 11.4 (*) 4.0 - 10.5 K/uL   RBC 3.17 (*) 3.87 - 5.11 MIL/uL   Hemoglobin 9.8 (*) 12.0 - 15.0 g/dL   HCT 40.1 (*) 02.7 - 25.3 %   MCV 94.3  78.0 - 100.0 fL   MCH 30.9  26.0 - 34.0 pg   MCHC 32.8  30.0 - 36.0 g/dL   RDW 66.4  40.3 - 47.4 %   Platelets 139  (*) 150 - 400 K/uL  RENAL FUNCTION PANEL     Status: Abnormal   Collection Time    08/10/12  5:44 AM      Result Value Range   Sodium 144  135 - 145 mEq/L   Potassium 4.1  3.5 - 5.1 mEq/L   Chloride 107  96 - 112 mEq/L   CO2 29  19 - 32 mEq/L   Glucose, Bld 93  70 - 99 mg/dL   BUN 50 (*) 6 - 23 mg/dL   Creatinine, Ser 2.59 (*) 0.50 - 1.10 mg/dL   Calcium 8.2 (*) 8.4 - 10.5 mg/dL   Phosphorus  4.6  2.3 - 4.6 mg/dL   Albumin 2.2 (*) 3.5 - 5.2 g/dL   GFR calc non Af Amer 22 (*) >90 mL/min   GFR calc Af Amer 25 (*) >90 mL/min    Intake/Output Summary (Last 24 hours) at 08/10/12 0843 Last data filed at 08/10/12 0617  Gross per 24 hour  Intake      0 ml  Output   4550 ml  Net  -4550 ml    ASSESSMENT AND PLAN:  NQWMI:  Likely related to supply demand mismatch related to stress of her urologic procedure and her sepsis.  I spoke with the patient and her daugher yesterday and updated them.  She is not a cath candidate.  We will likely follow up in the future with a probable outpatient stress test.  However, no further in patient eval [place] planned with her renal insufficiency.   EF is lower limits of normal. She is off full dose heparin.  Continue ASA.  I will start a low dose beta blocker.    CKD:  Creat continues to improve.   Fayrene Fearing [facility] 08/10/2012 8:43 AM

## 2012-08-10 NOTE — Progress Notes (Signed)
[facility] PROGRESS NOTE  ESBEYDI MANAGO ZOX:[id number] DOB: 1950/11/07 DOA: 08/07/2012 PCP: No primary provider on file.  Assessment/Plan: Active Problems:   Pyelonephritis, acute   Septic shock(785.52)   NSTEMI (non-ST elevated myocardial infarction)   Acute respiratory failure   Acute renal failure   1. Acute pyelonephritis/Severe sepsis/Septic shock: Patient was transferred from [facility], with hypoxia, hypotension, and features of septic shock, in the setting of UTI, right hydronephrosis and right uteteral stent placement. Source appears to be pyelonephritis/complicated UTI. Noted intra-operative hypotension. Managed with broad spectrum antibiotic and sesis protocol, with satisfactory clinical response. Sepsis has now resolved. Patient is on Meropenem, day# 4. Blood and urine cultures have remained negative. Will aim total of 10 days antibiotic therapy, and transition to oral monotherapy, after discussion with ID.  2. Obstructive uropathy/Hydronephrosis: This was secondary to recurrent calculi. At [facility], patient was found to have right hydronephrosis and underwent placement of a right ureteral stent on 08/06/12, bypassing 2 stones, with prurulent urine behind the stones. This is now resolved, Renal US revealed no recurrent hydronephrosis.  3. AKI/ARF: Baseline creatinine is 1.4, and at the time of transfer to [facility], creatinine was 4.5. This was multifactorial as above (sepsis, hypotension, obstruction, contrast, ARB, in diabetic pt). Dr Jeanice Lim provided nephrology consultation, and patient was managed as recommended. Fortunately, she has managed to avoid HD, and creatinine plateaued and started trending down. Today, creatinine is 2.26. Following renal indices. Oliguria has resolved, and urine out is much improved. Have discontinued Foley catheter today.  4. Acute Respiratory Distress/Hypoxic respiratory failure. This was evident on transfer. Patient had required BIPAP  pre-transfer, and is felt to be multifactorial, due to transient decompensation secondary to progressive ARF and metabolic disarray. Also NSTEMI, consider possible influence sedation given for renal stent placement. As of 08/08/12, respiratory failure had resolved Patient is now saturating at 99% on 2L.  5. NSTEMI/Chronic diastolic dysfunction: Patient had elevated troponin at [facility] 3.3->4.1, consistent with NSTEMI. EKG with no q waves, non- specific T wave flattening. Initially managed with iv Heparin infusion. 2D echo revealed EF of 50% to 55% and no regional wall motion abnormalities. Dr Rollene Rotunda provided cardiology consultation and has opined that this is likely related to supply demand mismatch, related to stress of her urologic procedure and sepsis. She is not considered a cath candidate. A probable outpatient stress test may be arranged in due course. On ASA and beta-blocker, per cardiology. CXR today showed cardiomegaly with mild interstitial pulmonary edema. Will need to observe closely.  6. Sigmoid Diverticulitis: This was noted on CT at [facility]. Patient has no abdominal pain, or tenderness.  7. GERD/Hx of Bleeding Ulcers: No active issues from this viewpoint.    Code Status: Full Code.  Family Communication:  Disposition Plan: to be determined.    Brief narrative: 62 y/o female smoker, with PMH of HTN, DM, HLD, Fibromyalgia, CVA x 2, bleeding Ulcers, PVD, CAD, s/p partial lung lobectomy for benign lesion, previously on Prednisone in late 90's for chronic back pain, kidney stones, admitted to [facility] on 08/04/12, with lower abdominal pain and UTI. Found to have diverticulitis on abdominal CT scan and question of renal calculi, as well as right hydronephrosis. Underwent ureteral stent placement 08/06/12. Post procedure was found to have hypoxia, resppiratory distress requiring bipap, acute renal failure & elevated troponin c/w NSTEMI. Transferred to [facility] on 08/07/12 in setting of  NSTEMI, acute renal failure & respiratory distress. Admitted to ICU, by Dr Levy Pupa, PCCM., and  her clinical course was notable for sepsis/septic shock, managed with Meropenem and iv fluid resuscitation, remained oliguric for a while, although imaging studies demonstrated absence of right hydronephrosis. Dr [name] provided nephrology consultation. Dr Peter [place] provided cardiology consultation. Patient was transferred to [facility] on 08/10/12.    Consultants:  Dr [name], nephrologist.  Procedures:  CXRs.  Renal U/S.  Antibiotics:  Meropenem 08/07/12>>>  HPI/Subjective: No new issue today. Feels better.   Objective: Vital signs in last 24 hours: Temp:  [97.9 F (36.6 C)-98.5 F (36.9 C)] 97.9 F (36.6 C) (04/11 0510) Pulse Rate:  [74-80] 80 (04/11 1018) BP: (131-183)/(67-94) 183/67 mmHg (04/11 1018) SpO2:  [97 %-99 %] 99 % (04/11 0510) Weight:  [72.122 kg (159 lb)] 72.122 kg (159 lb) (04/11 0510) Weight change: -0.907 kg (-2 lb) Last BM Date: 08/08/12  Intake/Output from previous day: 04/10 0701 - 04/11 0700 In: -  Out: 4550 [Urine:4550] Total I/O In: 120 [P.O.:120] Out: -    Physical Exam: General: Comfortable, alert, communicative, fully oriented, not short of breath at rest.  HEENT:  Mild clinical pallor, no jaundice, no conjunctival injection or discharge. NECK:  Supple, JVP not seen, no carotid bruits, no palpable lymphadenopathy, no palpable goiter. CHEST:  Clinically clear to auscultation, no wheezes, no crackles. HEART:  Sounds 1 and 2 heard, normal, regular, no murmurs. ABDOMEN:  Full, soft, non-tender, no palpable organomegaly, no palpable masses, normal bowel sounds. GENITALIA:  Not examined. LOWER EXTREMITIES:  No pitting edema, palpable peripheral pulses. MUSCULOSKELETAL SYSTEM:  Generalized osteoarthritic changes, otherwise, normal. CENTRAL NERVOUS SYSTEM:  No focal neurologic deficit on gross examination.  Lab Results:  Recent  Labs  08/09/12 1446 08/10/12 0543  WBC 15.8* 11.4*  HGB 9.7* 9.8*  HCT 28.8* 29.9*  PLT 121* 139*    Recent Labs  08/09/12 0540 08/10/12 0544  NA 139 144  K 4.7 4.1  CL 101 107  CO2 26 29  GLUCOSE 105* 93  BUN 64* 50*  CREATININE 3.69* 2.26*  CALCIUM 7.0* 8.2*   Recent Results (from the past 240 hour(s))  URINE CULTURE     Status: None   Collection Time    08/07/12  1:28 PM      Result Value Range Status   Specimen Description URINE, CATHETERIZED   Final   Special Requests NONE   Final   Culture  Setup Time 08/07/2012 14:02   Final   Colony Count NO GROWTH   Final   Culture NO GROWTH   Final   Report Status 08/08/2012 FINAL   Final  MRSA PCR SCREENING     Status: None   Collection Time    08/07/12  1:30 PM      Result Value Range Status   MRSA by PCR NEGATIVE  NEGATIVE Final   Comment:            The GeneXpert MRSA Assay (FDA     approved for NASAL specimens     only), is one component of a     comprehensive MRSA colonization     surveillance program. It is not     intended to diagnose MRSA     infection nor to guide or     monitor treatment for     MRSA infections.  CULTURE, BLOOD (ROUTINE X 2)     Status: None   Collection Time    08/07/12  1:45 PM      Result Value Range Status   Specimen Description  BLOOD LEFT ANTECUBITAL   Final   Special Requests BOTTLES DRAWN AEROBIC AND ANAEROBIC 10CC   Final   Culture  Setup Time 08/07/2012 17:25   Final   Culture     Final   Value:        BLOOD CULTURE RECEIVED NO GROWTH TO DATE CULTURE WILL BE HELD FOR 5 DAYS BEFORE ISSUING A FINAL NEGATIVE REPORT   Report Status PENDING   Incomplete  CULTURE, BLOOD (ROUTINE X 2)     Status: None   Collection Time    08/07/12  1:50 PM      Result Value Range Status   Specimen Description BLOOD LEFT HAND   Final   Special Requests BOTTLES DRAWN AEROBIC ONLY 2CC   Final   Culture  Setup Time 08/07/2012 17:25   Final   Culture     Final   Value:        BLOOD CULTURE RECEIVED  NO GROWTH TO DATE CULTURE WILL BE HELD FOR 5 DAYS BEFORE ISSUING A FINAL NEGATIVE REPORT   Report Status PENDING   Incomplete  URINE CULTURE     Status: None   Collection Time    08/08/12  9:30 AM      Result Value Range Status   Specimen Description URINE, CLEAN CATCH   Final   Special Requests NONE   Final   Culture  Setup Time 08/08/2012 13:47   Final   Colony Count NO GROWTH   Final   Culture NO GROWTH   Final   Report Status 08/09/2012 FINAL   Final     Studies/Results: Dg Chest 1 View  08/09/2012  *RADIOLOGY REPORT*  Clinical Data: Weakness and shortness of breath.  Evaluate for pulmonary edema.  CHEST - 1 VIEW  Comparison: Chest x-ray 08/08/2012.  Findings: Mild elevation of the right hemidiaphragm is unchanged. Bibasilar opacities (right greater than left), favored to predominately reflect subsegmental atelectasis.  Small right pleural effusion appears slightly increased compared to the prior study. There is cephalization of the pulmonary vasculature and slight indistinctness of the interstitial markings suggestive of mild pulmonary edema.  Mild cardiomegaly. The patient is rotated to the right on today's exam, resulting in distortion of the mediastinal contours and reduced diagnostic sensitivity and specificity for mediastinal pathology.  Atherosclerosis in the thoracic aorta.  IMPRESSION: 1.  Cardiomegaly with mild interstitial pulmonary edema. 2.  Bibasilar opacities favored to reflect subsegmental atelectasis.  Small right pleural effusion and chronic elevation of the right hemidiaphragm.   Original Report Authenticated By: Trudie Reed, M.D.     Medications: Scheduled Meds: . albuterol  2.5 mg Nebulization Q6H  . aspirin EC  325 mg Oral Daily  . calcium acetate  667 mg Oral TID WC  . heparin subcutaneous  5,000 Units Subcutaneous Q8H  . ipratropium  0.5 mg Nebulization Q6H  . meropenem (MERREM) IV  500 mg Intravenous Q12H  . metoprolol tartrate  25 mg Oral BID  .  pantoprazole  40 mg Oral Daily  . simvastatin  20 mg Oral q1800   Continuous Infusions:  PRN Meds:.acetaminophen, morphine injection, nitroGLYCERIN, ondansetron (ZOFRAN) IV    LOS: 3 days   Shaft Corigliano,CHRISTOPHER  [facility] Pager [phone]. If 8PM-8AM, please contact night-coverage at [email or web address], password [facility] 08/10/2012, 12:10 PM  LOS: 3 days

## 2012-08-10 NOTE — Progress Notes (Signed)
Subjective:  Continued improvement in renal function No invasive cardia workup planned Objective Vital signs in last 24 hours: Filed Vitals:   08/09/12 1949 08/09/12 2115 08/10/12 0510 08/10/12 1018  BP: 131/94  180/82 183/67  Pulse: 79  74 80  Temp: 98.5 F (36.9 C)  97.9 F (36.6 C)   TempSrc: Oral  Oral   Resp:      Height:      Weight:   72.122 kg (159 lb)   SpO2: 98% 97% 99%    I/O last 3 completed shifts: In: -  Out: 6350 [Urine:6350] Total I/O In: 120 [P.O.:120] Out: -    Physical Exam:  Blood pressure 137/88, pulse 56, temperature 97.9 F (36.6 C), temperature source Oral, resp. rate 16, height 5\' 1"  (1.549 m), weight 68.1 kg (150 lb 2.1 oz), SpO2 98.00%. Awake and alert Lungs grossly clear anteiorly 1/6 murmur ULSB no radiation.  No rub Abdomen soft without focal  tenderness Min edema LE's  Labs: Basic Metabolic Panel:  Recent Labs Lab 08/07/12 1625 08/08/12 0147 08/08/12 1030 08/09/12 0540 08/10/12 0544  NA 133* 131* 129* 139 144  K 4.9 4.5 4.3 4.7 4.1  CL 101 97 95* 101 107  CO2 15* 17* 21 26 29   GLUCOSE 95 96 100* 105* 93  BUN 58* 64* 69* 64* 50*  CREATININE 4.69* 4.79* 4.58* 3.69* 2.26*  CALCIUM 7.0* 6.6* 6.5* 7.0* 8.2*  PHOS 7.0*  --  7.2* 6.7* 4.6   Liver Function Tests:  Recent Labs Lab 08/08/12 1030 08/09/12 0540 08/10/12 0544  ALBUMIN 2.3* 2.3* 2.2*    Recent Labs Lab 08/07/12 1625 08/08/12 0147 08/09/12 0540 08/09/12 1446 08/10/12 0543  WBC 29.4* 26.7* 17.5* 15.8* 11.4*  NEUTROABS  --  24.3*  --  13.4*  --   HGB 10.5* 9.7* 9.6* 9.7* 9.8*  HCT 31.8* 29.1* 28.4* 28.8* 29.9*  MCV 95.5 94.2 92.5 91.7 94.3  PLT 144* 141* 143* 121* 139*  CBG:  Recent Labs Lab 08/07/12 2059 08/08/12 0019 08/08/12 0352 08/08/12 0759 08/08/12 1122  GLUCAP 100* 98 106* 99 102*   Results for ZENIYA, LAPIDUS (MRN [id number]) as of 08/08/2012 09:58  Ref. Range 08/07/2012 15:00 08/07/2012 17:53  PROTEIN CREATININE RATIO Latest Range: 0.00-0.15    4.45 (H)   Studies/Results: Dg Chest 1 View  08/09/2012  *RADIOLOGY REPORT*  Clinical Data: Weakness and shortness of breath.  Evaluate for pulmonary edema.  CHEST - 1 VIEW  Comparison: Chest x-ray 08/08/2012.  Findings: Mild elevation of the right hemidiaphragm is unchanged. Bibasilar opacities (right greater than left), favored to predominately reflect subsegmental atelectasis.  Small right pleural effusion appears slightly increased compared to the prior study. There is cephalization of the pulmonary vasculature and slight indistinctness of the interstitial markings suggestive of mild pulmonary edema.  Mild cardiomegaly. The patient is rotated to the right on today's exam, resulting in distortion of the mediastinal contours and reduced diagnostic sensitivity and specificity for mediastinal pathology.  Atherosclerosis in the thoracic aorta.  IMPRESSION: 1.  Cardiomegaly with mild interstitial pulmonary edema. 2.  Bibasilar opacities favored to reflect subsegmental atelectasis.  Small right pleural effusion and chronic elevation of the right hemidiaphragm.   Original Report Authenticated By: Trudie Reed, M.D.    Medications:   . albuterol  2.5 mg Nebulization Q6H  . aspirin EC  325 mg Oral Daily  . calcium acetate  667 mg Oral TID WC  . heparin subcutaneous  5,000 Units Subcutaneous Q8H  . ipratropium  0.5 mg Nebulization Q6H  . meropenem (MERREM) IV  500 mg Intravenous Q12H  . metoprolol tartrate  25 mg Oral BID  . pantoprazole  40 mg Oral Daily  . simvastatin  20 mg Oral q1800       I  have reviewed scheduled and prn medications.  ASSESSMENT/RECOMMENDATIONS  62 y.o. year-old WF with a background of NIDDM, HTN, hyperlipidemia, prior CVA'swith no residual, PAD with prior left carotid to subclavian anastamosis in 2004 [facility]) for subclavian steal who presented to [facility] on 08/05/12 with acute right flank pain and a creatinine on 1.4 (historical creatinines prior to this not known). Right  hydronephrosis was treated with ureteral stenting, bypassing 2 stones, with prurulent urine behind the stones. Post procedure developed respiratory distress requiring BIPAP, ECG changes, and troponin elevation consistent with NSTEMI, hypotension (sepsis vs cardiac). On ARB PTA. Has developed acute renal failure and metabolic acidosis in this setting with creatinine up to 4.5 on admission  1.  AKI multifactorial as above (sepsis, hypotension, obstruction, contrast, ARB, in diabetic pt)  S/p ureteral stent for right hydro d/t stones  UPC 4.4 gm c/w diab disease Baseline prior to procedures 1.4  More historical creatinines not known  Hypotension, outpt ARB, acute NSTEMI with resp failure contributing to hemodynamic component of AKI Initial FeNA 1.6 c/w ATN Renal function clearly improving at this time  2.  Right renal stones with obstruction s/p ureteral stent at outside hosp 4/6 Renal US resolved hydro  3.  Presumed urosepsis On antibiotics (meropenem) Cultures pending (none done prior to ATB's at outside hospital) [facility] neg  4.  NSTEMI EF 50-55 No cath planned Off heparin/on ASA and for BB  5.  Hypotension/shock Resolved  6.  Metabolic acidosis Resolved  7.  Mild hyponatremia Resolved  8.  Hyperphosphatemia, hypocalcemia On Phoslo 667 TID with meals With renal recovery will not need so will stop  9.  Resp distress/COPD/H/O RLL lobectomy/restrictive lung ds Appears stable pulm wise   At this point in time, with excellent diuresis (spontaneous), daily improvement in renal function, and resolution of electrolyte abnormalities, will sign off.  Please call if questions.  Will need followup with urology (re recently placed stent) but no nephrology f/u needed post hospitalization.  Please call for questions.   [name], MD [facility] [phone] pager 08/10/2012, 12:11 PM

## 2012-08-10 NOTE — Progress Notes (Signed)
Pt is resting in bed. Pt's BP is 180/82. Pt denies any headache or blurred vision at this time. Pt refuses for BP to be rechecked. Md on call made aware. Will cont to monitor pt.

## 2012-08-11 LAB — RENAL FUNCTION PANEL
Albumin: 2.4 g/dL — ABNORMAL LOW (ref 3.5–5.2)
BUN: 36 mg/dL — ABNORMAL HIGH (ref 6–23)
CO2: 31 mEq/L (ref 19–32)
Calcium: 8.8 mg/dL (ref 8.4–10.5)
Creatinine, Ser: 1.58 mg/dL — ABNORMAL HIGH (ref 0.50–1.10)
GFR calc non Af Amer: 34 mL/min — ABNORMAL LOW (ref >90)

## 2012-08-11 LAB — CBC
Hemoglobin: 10 g/dL — ABNORMAL LOW (ref 12.0–15.0)
MCH: 31.5 pg (ref 26.0–34.0)
MCHC: 32.6 g/dL (ref 30.0–36.0)
MCV: 96.8 fL (ref 78.0–100.0)

## 2012-08-11 MED ORDER — AMLODIPINE BESYLATE 2.5 MG PO TABS
2.5000 mg | ORAL_TABLET | Freq: Every day | ORAL | Status: DC
  Administered 2012-08-11 – 2012-08-12 (×2): 2.5 mg via ORAL
  Filled 2012-08-11 (×2): qty 1

## 2012-08-11 MED ORDER — NYSTATIN 100000 UNIT/ML MT SUSP
5.0000 mL | Freq: Four times a day (QID) | OROMUCOSAL | Status: DC
  Administered 2012-08-11 – 2012-08-12 (×3): 500000 [IU] via ORAL
  Filled 2012-08-11 (×6): qty 5

## 2012-08-11 MED ORDER — LEVOFLOXACIN 250 MG PO TABS
250.0000 mg | ORAL_TABLET | Freq: Every day | ORAL | Status: DC
  Administered 2012-08-12: 250 mg via ORAL
  Filled 2012-08-11: qty 1

## 2012-08-11 NOTE — Progress Notes (Signed)
   Subjective:  The patient denies any chest discomfort or shortness of breath.  She is tolerating low dose beta blocker.  Her systolic blood pressure remains elevated.  Her renal function continues to improve.  Objective:  Vital Signs in the last 24 hours: Temp:  [98.1 F (36.7 C)-98.5 F (36.9 C)] 98.1 F (36.7 C) (04/12 0410) Pulse Rate:  [62-72] 72 (04/12 1001) Resp:  [20] 20 (04/11 1417) BP: (152-185)/(64-94) 185/94 mmHg (04/12 1001) SpO2:  [95 %-98 %] 97 % (04/12 0410) Weight:  [159 lb (72.122 kg)] 159 lb (72.122 kg) (04/12 0410)  Intake/Output from previous day: 04/11 0701 - 04/12 0700 In: 360 [P.O.:360] Out: 1150 [Urine:1150] Intake/Output from this shift:    . amLODipine  2.5 mg Oral Daily  . aspirin EC  325 mg Oral Daily  . heparin subcutaneous  5,000 Units Subcutaneous Q8H  . meropenem (MERREM) IV  500 mg Intravenous Q12H  . metoprolol tartrate  25 mg Oral BID  . pantoprazole  40 mg Oral Daily  . simvastatin  20 mg Oral q1800      Physical Exam: The patient appears to be in no distress.  Head and neck exam reveals that the pupils are equal and reactive.  The extraocular movements are full.  There is no scleral icterus.  Mouth and pharynx are benign.  No lymphadenopathy.  No carotid bruits.  The jugular venous pressure is normal.  Thyroid is not enlarged or tender.  Chest reveals mild expiratory wheezes.  Heart reveals no abnormal lift or heave.  First and second heart sounds are normal.  There is no murmur gallop rub or click.  The abdomen is soft and nontender.  Bowel sounds are normoactive.  There is no hepatosplenomegaly or mass.  There are no abdominal bruits.  Extremities reveal no phlebitis or edema.  Pedal pulses are good.  There is no cyanosis or clubbing.  Neurologic exam is normal strength and no lateralizing weakness.  No sensory deficits.  Integument reveals no rash  Lab Results:  Recent Labs  08/10/12 0543 08/11/12 0425  WBC 11.4* 10.0    HGB 9.8* 10.0*  PLT 139* 144*    Recent Labs  08/10/12 0544 08/11/12 0425  NA 144 145  K 4.1 4.3  CL 107 106  CO2 29 31  GLUCOSE 93 101*  BUN 50* 36*  CREATININE 2.26* 1.58*   No results found for this basename: TROPONINI, CK, MB,  in the last 72 hours Hepatic Function Panel  Recent Labs  08/11/12 0425  ALBUMIN 2.4*   No results found for this basename: CHOL,  in the last 72 hours No results found for this basename: PROTIME,  in the last 72 hours  Imaging: No results found.  Cardiac Studies: Telemetry shows sinus bradycardia Assessment/Plan:  Non-Q wave myocardial infarction secondary to supply demand mismatch secondary to stress of her urologic procedure and her sepsis. Acute renal failure, improving Systolic hypertension  Plan: Continue current dose of beta blocker and will restart home dose of amlodipine 2.5 mg daily.  Continue aspirin  LOS: 4 days    Cassell Clement 08/11/2012, 11:05 AM

## 2012-08-11 NOTE — Evaluation (Signed)
Physical Therapy Evaluation Patient Details Name: Alexa Key MRN: [id number] DOB: 11-12-1950 Today's Date: 08/11/2012 Time: 4098-1191 PT Time Calculation (min): 15 min  PT Assessment / Plan / Recommendation Clinical Impression  Patient is a 61 yo female admitted with pyelonephritis, hypotension, NSTEMI, acute renal failure, and resp failure.  Patient with general weakness and DOE.  Will benefit from acute PT to maximize independence prior to discharge home with daughter.    PT Assessment  Patient needs continued PT services    Follow Up Recommendations  Home health PT;Supervision/Assistance - 24 hour    Does the patient have the potential to tolerate intense rehabilitation      Barriers to Discharge None Patient staying with daughter - available 24 hours    Equipment Recommendations  Rolling walker with 5" wheels    Recommendations for Other Services     Frequency Min 3X/week    Precautions / Restrictions Precautions Precautions: Fall Restrictions Weight Bearing Restrictions: No   Pertinent Vitals/Pain       Mobility  Bed Mobility Bed Mobility: Not assessed Transfers Transfers: Sit to Stand;Stand to Sit Sit to Stand: 4: Min guard;With upper extremity assist;With armrests;From chair/3-in-1 Stand to Sit: 4: Min guard;With upper extremity assist;With armrests;To chair/3-in-1 Details for Transfer Assistance: Verbal cues for hand placement.  Assist for balance/safety Ambulation/Gait Ambulation/Gait Assistance: 4: Min assist;4: Min guard Ambulation Distance (Feet): 116 Feet Assistive device: None;Rolling walker Ambulation/Gait Assistance Details: Patient ambulated 20' with no AD with decreased balance, and reaching for furniture and walls for stability.  Patient then ambulated 86' with RW with improved balance.  Verbal cues on safe use of RW, and stand upright. Gait Pattern: Step-through pattern;Decreased step length - right;Decreased step length - left;Decreased stride  length;Trunk flexed (Left foot turned outward) Gait velocity: Slow gait speed    Exercises     PT Diagnosis: Difficulty walking;Abnormality of gait;Generalized weakness  PT Problem List: Decreased strength;Decreased activity tolerance;Decreased balance;Decreased mobility;Decreased knowledge of use of DME;Cardiopulmonary status limiting activity PT Treatment Interventions: DME instruction;Gait training;Stair training;Functional mobility training;Patient/family education   PT Goals Acute Rehab PT Goals PT Goal Formulation: With patient Time For Goal Achievement: 08/18/12 Potential to Achieve Goals: Good Pt will go Supine/Side to Sit: Independently;with HOB 0 degrees PT Goal: Supine/Side to Sit - Progress: Goal set today Pt will go Sit to Supine/Side: Independently;with HOB 0 degrees PT Goal: Sit to Supine/Side - Progress: Goal set today Pt will go Sit to Stand: with supervision;with upper extremity assist PT Goal: Sit to Stand - Progress: Goal set today Pt will Ambulate: 51 - 150 feet;with supervision;with rolling walker (with good balance) PT Goal: Ambulate - Progress: Goal set today Pt will Go Up / Down Stairs: 3-5 stairs;with supervision;with rail(s);with least restrictive assistive device PT Goal: Up/Down Stairs - Progress: Goal set today  Visit Information  Last PT Received On: 08/11/12 Assistance Needed: +1    Subjective Data  Subjective: "I'm going to my daughter's house when I leave the hospital" Patient Stated Goal: To leave hospital soon   Prior Functioning  Home Living Lives With: Alone Available Help at Discharge: Family;Available 24 hours/day (staying with daughter at discharge) Type of Home: House Home Access: Stairs to enter [employer] of Steps: 3 Entrance Stairs-Rails: Right;Left Home Layout: One level Bathroom Shower/Tub: Health visitor: Standard Home Adaptive Equipment: Shower chair with back Prior Function Level of  Independence: Independent Able to Take Stairs?: Yes Driving: Yes Vocation: On disability Communication Communication: No difficulties    Cognition  Cognition Overall Cognitive Status: Appears within functional limits for tasks assessed/performed Arousal/Alertness: Awake/alert Orientation Level: Appears intact for tasks assessed Behavior During Session: [facility] for tasks performed    Extremity/Trunk Assessment Right Upper Extremity Assessment RUE ROM/Strength/Tone: WFL for tasks assessed RUE Sensation: WFL - Light Touch Left Upper Extremity Assessment LUE ROM/Strength/Tone: WFL for tasks assessed LUE Sensation: WFL - Light Touch Right Lower Extremity Assessment RLE ROM/Strength/Tone: Deficits RLE ROM/Strength/Tone Deficits: General weakness 4/5 RLE Sensation: WFL - Light Touch RLE Coordination: WFL - gross motor Left Lower Extremity Assessment LLE ROM/Strength/Tone: Deficits LLE ROM/Strength/Tone Deficits: General weakness 4/5 LLE Sensation: WFL - Light Touch LLE Coordination: WFL - gross motor   Balance    End of Session PT - End of Session Equipment Utilized During Treatment: Gait belt Activity Tolerance: Patient limited by fatigue Patient left: in chair;with call bell/phone within reach Nurse Communication: Mobility status  GP     Vena Austria 08/11/2012, 4:12 PM Durenda Hurt. Renaldo Fiddler, [facility] Acute Rehab Services Pager [phone]

## 2012-08-11 NOTE — Progress Notes (Signed)
[facility] PROGRESS NOTE  Alexa Key QQV:[id number] DOB: May 02, 1951 DOA: 08/07/2012 PCP: No primary provider on file.  Assessment/Plan: Active Problems:   Pyelonephritis, acute   Septic shock(785.52)   NSTEMI (non-ST elevated myocardial infarction)   Acute respiratory failure   Acute renal failure   1. Acute pyelonephritis/Severe sepsis/Septic shock: Patient was transferred from [facility], with hypoxia, hypotension, and features of septic shock, in the setting of UTI, right hydronephrosis and right uteteral stent placement. Source appears to be pyelonephritis/complicated UTI. Noted intra-operative hypotension. Managed with broad spectrum antibiotic and sesis protocol, with satisfactory clinical response. Sepsis has now resolved. Patient is on Meropenem, day# 5. Blood and urine cultures have remained negative. Will aim total of 10 days antibiotic therapy, and transition to oral Levaquin monotherapy from 08/12/12, for another 5 days, to be concluded on 08/16/12. 2. Obstructive uropathy/Hydronephrosis: This was secondary to recurrent calculi. At [facility], patient was found to have right hydronephrosis and underwent placement of a right ureteral stent on 08/06/12, bypassing 2 stones, with prurulent urine behind the stones. This is now resolved, Renal US revealed no recurrent hydronephrosis. Patient will need urology follow up, on discharge.  3. AKI/ARF: Baseline creatinine is 1.4, and at the time of transfer to [facility], creatinine was 4.5. This was multifactorial as above (sepsis, hypotension, obstruction, contrast, ARB, in diabetic pt). Dr Jeanice Lim provided nephrology consultation, and patient was managed as recommended. Fortunately, she has managed to avoid HD, and creatinine plateaued and started trending down. Following renal indices. Today, creatinine is 1.58, ie, close to baseline. Oliguria has resolved, and urine out is much improved. Have discontinued Foley catheter on 08/10/12. .   4. Acute Respiratory Distress/Hypoxic respiratory failure. This was evident on transfer. Patient had required BIPAP pre-transfer, and is felt to be multifactorial, due to transient decompensation secondary to progressive ARF and metabolic disarray. Also NSTEMI, consider possible influence sedation given for renal stent placement. As of 08/08/12, respiratory failure had resolved Patient is now saturating at 94%-97% on 2L. Have requested evaluation for possible short-term home O2.  5. NSTEMI/Chronic diastolic dysfunction/HTN: Patient had elevated troponin at [facility] 3.3->4.1, consistent with NSTEMI. EKG with no q waves, non- specific T wave flattening. Initially managed with iv Heparin infusion. 2D echo revealed EF of 50% to 55% and no regional wall motion abnormalities. Dr Rollene Rotunda provided cardiology consultation and has opined that this is likely related to supply demand mismatch, related to stress of her urologic procedure and sepsis. She is not considered a cath candidate. A probable outpatient stress test may be arranged in due course. On ASA and beta-blocker, per cardiology. CXR on 08/10/12 showed cardiomegaly with mild interstitial pulmonary edema. Will need to observe closely. BP i sub-optimally controlled and cardiology made some adjustments today.  6. Sigmoid Diverticulitis: This was noted on CT at [facility]. Patient has no abdominal pain, or tenderness.  7. GERD/Hx of Bleeding Ulcers: No active issues from this viewpoint.    Code Status: Full Code.  Family Communication:  Disposition Plan: to be determined. Nearing discharge. Awaiting PT/OT eval and recommendations. May need home O2, and need urology follow up for urolithiasis/stent.    Brief narrative: 62 y/o female smoker, with PMH of HTN, DM, HLD, Fibromyalgia, CVA x 2, bleeding Ulcers, PVD, CAD, s/p partial lung lobectomy for benign lesion, previously on Prednisone in late 90's for chronic back pain, kidney stones, admitted to  [facility] on 08/04/12, with lower abdominal pain and UTI. Found to have diverticulitis on abdominal CT scan  and question of renal calculi, as well as right hydronephrosis. Underwent ureteral stent placement 08/06/12. Post procedure was found to have hypoxia, resppiratory distress requiring bipap, acute renal failure & elevated troponin c/w NSTEMI. Transferred to [facility] on 08/07/12 in setting of NSTEMI, acute renal failure & respiratory distress. Admitted to ICU, by Dr Levy Pupa, PCCM., and her clinical course was notable for sepsis/septic shock, managed with Meropenem and iv fluid resuscitation, remained oliguric for a while, although imaging studies demonstrated absence of right hydronephrosis. Dr [name] provided nephrology consultation. Dr Peter [place] provided cardiology consultation. Patient was transferred to [facility] on 08/10/12.    Consultants:  Dr [name], nephrologist.  Procedures:  CXRs.  Renal U/S.  Antibiotics:  Meropenem 08/07/12-08/11/12.  Levaquin 08/12/12>>>  HPI/Subjective: Feels much better.   Objective: Vital signs in last 24 hours: Temp:  [98.1 F (36.7 C)-98.4 F (36.9 C)] 98.2 F (36.8 C) (04/12 1317) Pulse Rate:  [62-72] 66 (04/12 1317) Resp:  [20] 20 (04/12 1317) BP: (143-185)/(48-94) 143/48 mmHg (04/12 1317) SpO2:  [89 %-97 %] 94 % (04/12 1322) Weight:  [72.122 kg (159 lb)] 72.122 kg (159 lb) (04/12 0410) Weight change: 0 kg (0 lb) Last BM Date: 08/10/12  Intake/Output from previous day: 04/11 0701 - 04/12 0700 In: 360 [P.O.:360] Out: 1150 [Urine:1150]     Physical Exam: General: Comfortable, alert, communicative, fully oriented, not short of breath at rest.  HEENT:  Mild clinical pallor, no jaundice, no conjunctival injection or discharge. NECK:  Supple, JVP not seen, no carotid bruits, no palpable lymphadenopathy, no palpable goiter. CHEST:  Clinically clear to auscultation, no wheezes, no crackles. HEART:  Sounds 1 and 2 heard,  normal, regular, no murmurs. ABDOMEN:  Full, soft, non-tender, no palpable organomegaly, no palpable masses, normal bowel sounds. GENITALIA:  Not examined. LOWER EXTREMITIES:  No pitting edema, palpable peripheral pulses. MUSCULOSKELETAL SYSTEM:  Generalized osteoarthritic changes, otherwise, normal. CENTRAL NERVOUS SYSTEM:  No focal neurologic deficit on gross examination.  Lab Results:  Recent Labs  08/10/12 0543 08/11/12 0425  WBC 11.4* 10.0  HGB 9.8* 10.0*  HCT 29.9* 30.7*  PLT 139* 144*    Recent Labs  08/10/12 0544 08/11/12 0425  NA 144 145  K 4.1 4.3  CL 107 106  CO2 29 31  GLUCOSE 93 101*  BUN 50* 36*  CREATININE 2.26* 1.58*  CALCIUM 8.2* 8.8   Recent Results (from the past 240 hour(s))  URINE CULTURE     Status: None   Collection Time    08/07/12  1:28 PM      Result Value Range Status   Specimen Description URINE, CATHETERIZED   Final   Special Requests NONE   Final   Culture  Setup Time 08/07/2012 14:02   Final   Colony Count NO GROWTH   Final   Culture NO GROWTH   Final   Report Status 08/08/2012 FINAL   Final  MRSA PCR SCREENING     Status: None   Collection Time    08/07/12  1:30 PM      Result Value Range Status   MRSA by PCR NEGATIVE  NEGATIVE Final   Comment:            The GeneXpert MRSA Assay (FDA     approved for NASAL specimens     only), is one component of a     comprehensive MRSA colonization     surveillance program. It is not     intended to diagnose MRSA  infection nor to guide or     monitor treatment for     MRSA infections.  CULTURE, BLOOD (ROUTINE X 2)     Status: None   Collection Time    08/07/12  1:45 PM      Result Value Range Status   Specimen Description BLOOD LEFT ANTECUBITAL   Final   Special Requests BOTTLES DRAWN AEROBIC AND ANAEROBIC 10CC   Final   Culture  Setup Time 08/07/2012 17:25   Final   Culture     Final   Value:        BLOOD CULTURE RECEIVED NO GROWTH TO DATE CULTURE WILL BE HELD FOR 5 DAYS BEFORE  ISSUING A FINAL NEGATIVE REPORT   Report Status PENDING   Incomplete  CULTURE, BLOOD (ROUTINE X 2)     Status: None   Collection Time    08/07/12  1:50 PM      Result Value Range Status   Specimen Description BLOOD LEFT HAND   Final   Special Requests BOTTLES DRAWN AEROBIC ONLY 2CC   Final   Culture  Setup Time 08/07/2012 17:25   Final   Culture     Final   Value:        BLOOD CULTURE RECEIVED NO GROWTH TO DATE CULTURE WILL BE HELD FOR 5 DAYS BEFORE ISSUING A FINAL NEGATIVE REPORT   Report Status PENDING   Incomplete  URINE CULTURE     Status: None   Collection Time    08/08/12  9:30 AM      Result Value Range Status   Specimen Description URINE, CLEAN CATCH   Final   Special Requests NONE   Final   Culture  Setup Time 08/08/2012 13:47   Final   Colony Count NO GROWTH   Final   Culture NO GROWTH   Final   Report Status 08/09/2012 FINAL   Final     Studies/Results: No results found.  Medications: Scheduled Meds: . amLODipine  2.5 mg Oral Daily  . aspirin EC  325 mg Oral Daily  . heparin subcutaneous  5,000 Units Subcutaneous Q8H  . meropenem (MERREM) IV  500 mg Intravenous Q12H  . metoprolol tartrate  25 mg Oral BID  . pantoprazole  40 mg Oral Daily  . simvastatin  20 mg Oral q1800   Continuous Infusions:  PRN Meds:.acetaminophen, albuterol, ipratropium, morphine injection, nitroGLYCERIN, ondansetron (ZOFRAN) IV    LOS: 4 days   Chandlar Guice,CHRISTOPHER  [facility] Pager [phone]. If 8PM-8AM, please contact night-coverage at [email or web address], password [facility] 08/11/2012, 3:58 PM  LOS: 4 days

## 2012-08-12 LAB — CBC
HCT: 33 % — ABNORMAL LOW (ref 36.0–46.0)
Hemoglobin: 10.8 g/dL — ABNORMAL LOW (ref 12.0–15.0)
WBC: 12.1 10*3/uL — ABNORMAL HIGH (ref 4.0–10.5)

## 2012-08-12 LAB — BASIC METABOLIC PANEL
BUN: 27 mg/dL — ABNORMAL HIGH (ref 6–23)
CO2: 29 mEq/L (ref 19–32)
Chloride: 107 mEq/L (ref 96–112)
Glucose, Bld: 120 mg/dL — ABNORMAL HIGH (ref 70–99)
Potassium: 4 mEq/L (ref 3.5–5.1)

## 2012-08-12 MED ORDER — LEVOFLOXACIN 250 MG PO TABS: 500.0000 mg | ORAL_TABLET | Freq: Every day | ORAL | Status: DC

## 2012-08-12 MED ORDER — NICOTINE 21 MG/24HR TD PT24: 1.0000 | MEDICATED_PATCH | TRANSDERMAL | Status: DC

## 2012-08-12 MED ORDER — METOPROLOL TARTRATE 25 MG PO TABS: 25.0000 mg | ORAL_TABLET | Freq: Two times a day (BID) | ORAL | Status: DC

## 2012-08-12 NOTE — Discharge Summary (Addendum)
Physician Discharge Summary  Alexa Key ZOX:[id number] DOB: 1951-03-29 DOA: 08/07/2012  PCP: No primary provider on file.  Admit date: 08/07/2012 Discharge date: 08/12/2012  Time spent: 30 minutes  Recommendations for Outpatient Follow-up:  1. Follow up with urology  Discharge Diagnoses:  Active Problems:   Pyelonephritis, acute   Septic shock(785.52)   NSTEMI (non-ST elevated myocardial infarction)   Acute respiratory failure   Acute renal failure   Discharge Condition: stabel  Diet recommendation: regular  Filed Weights   08/10/12 0510 08/11/12 0410 08/12/12 0500  Weight: 72.122 kg (159 lb) 72.122 kg (159 lb) 72 kg (158 lb 11.7 oz)    History of present illness:  62 y/o F admitted to [facility] hospital on 4/5 with lower abdominal pain and UTI. Found to have diverticulitis on abdominal CT and question of renal calculi and R hydronephrosis. Underwent ureteral stent placement 4/7. Post procedure was found to have hypoxia / resp distress requiring bipap, acute renal failure & elevated troponin c/w NSTEMI. Tx to [facility] on 4/8 in setting of NSTEMI, acute renal failure & resp distress.      Hospital Course:  Pyelonephritis, acute/ Septic shock:  - Tansferred from [facility] hospital, with hypoxia, hypotension, and features of septic shock, in the setting of UTI  - Patient is on Meropenem, day# 6.  - Blood and urine cultures have remained negative.  - Transition to oral Levaquin monotherapy from 08/12/12, for another 5 days.  Obstructive uropathy/Hydronephrosis:  - Secondary to recurrent calculi. At [facility], patient was found to have right hydronephrosis and underwent placement of a right ureteral stent on 08/06/12, bypassing 2 stones, with prurulent urine behind the stones.  - This is now resolved, Renal US revealed no recurrent hydronephrosis. Patient will need urology follow up, on discharge .  AKI/ARF:  - Baseline creatinine is 1.4, and at the time of transfer to [facility],  creatinine was 4.5.  - This was multifactorial (sepsis, hypotension, obstruction, contrast, ARB, in diabetic pt).  - Today, creatinine is 1.58, ie, close to baseline.   NSTEMI (non-ST elevated myocardial infarction)/Chronic diastolic dysfunction/HTN  - Cardiology consultation, likely related to supply demand mismatch, related to stress of her urologic procedure and sepsis.  - She is not considered a cath candidate.  - A probable outpatient stress test may be arranged in due course. On ASA and beta-blocker, per cardiology   Acute Respiratory Distress/Hypoxic respiratory failure:  - On transfer pient had required BIPAP pre-transfer, and is felt to be multifactorial.  - lso NSTEMI, consider possible influence sedation given for renal stent placement.  - As of 08/08/12, respiratory failure had resolved Patient is now saturating at 94%-97% on 2L.   Consultation:  Dr [name], nephrologist.  Cardiology  Procedures:  CXRs.  Renal U/S.  Antibiotics:  Meropenem 08/07/12-08/11/12.  Levaquin 08/12/12>>>4.18.2014   Discharge Exam: Filed Vitals:   08/11/12 2138 08/11/12 2300 08/12/12 0500 08/12/12 1008  BP: 152/82  147/89 154/62  Pulse: 57 60 59 57  Temp: 98.3 F (36.8 C)  98 F (36.7 C)   TempSrc: Oral  Oral   Resp: 18  18   Height:      Weight:   72 kg (158 lb 11.7 oz)   SpO2: 100%  98%     General: See progress note   Discharge Instructions      Discharge Orders   Future Orders Complete By Expires     Diet - low sodium heart healthy  As directed  Increase activity slowly  As directed         Medication List    TAKE these medications       amLODipine 2.5 MG tablet  Commonly known as:  NORVASC  Take 2.5 mg by mouth daily.     aspirin 325 MG tablet  Take 325 mg by mouth daily.     cholecalciferol 1000 UNITS tablet  Commonly known as:  VITAMIN D  Take 2,000 Units by mouth daily.     fenofibrate 160 MG tablet  Take 160 mg by mouth daily.     levofloxacin 250  MG tablet  Commonly known as:  LEVAQUIN  Take 2 tablets (500 mg total) by mouth daily.     losartan 100 MG tablet  Commonly known as:  COZAAR  Take 100 mg by mouth daily.     metoprolol tartrate 25 MG tablet  Commonly known as:  LOPRESSOR  Take 1 tablet (25 mg total) by mouth 2 (two) times daily.     nicotine 21 mg/24hr patch  Commonly known as:  [employer] NICOTINE  Place 1 patch onto the skin daily.     omeprazole 20 MG capsule  Commonly known as:  PRILOSEC  Take 20 mg by mouth daily.     pravastatin 40 MG tablet  Commonly known as:  PRAVACHOL  Take 40 mg by mouth every evening.       Follow-up Information   Schedule an appointment as soon as possible for a visit with [facility].   Contact information:   [address] [place] [place] [address] [phone]       The results of significant diagnostics from this hospitalization (including imaging, microbiology, ancillary and laboratory) are listed below for reference.    Significant Diagnostic Studies: Dg Chest 1 View  08/09/2012  *RADIOLOGY REPORT*  Clinical Data: Weakness and shortness of breath.  Evaluate for pulmonary edema.  CHEST - 1 VIEW  Comparison: Chest x-ray 08/08/2012.  Findings: Mild elevation of the right hemidiaphragm is unchanged. Bibasilar opacities (right greater than left), favored to predominately reflect subsegmental atelectasis.  Small right pleural effusion appears slightly increased compared to the prior study. There is cephalization of the pulmonary vasculature and slight indistinctness of the interstitial markings suggestive of mild pulmonary edema.  Mild cardiomegaly. The patient is rotated to the right on today's exam, resulting in distortion of the mediastinal contours and reduced diagnostic sensitivity and specificity for mediastinal pathology.  Atherosclerosis in the thoracic aorta.  IMPRESSION: 1.  Cardiomegaly with mild interstitial pulmonary edema. 2.  Bibasilar opacities  favored to reflect subsegmental atelectasis.  Small right pleural effusion and chronic elevation of the right hemidiaphragm.   Original Report Authenticated By: Trudie Reed, M.D.    US Renal Port  08/07/2012  *RADIOLOGY REPORT*  Clinical Data:  Recent right renal stent placement  RENAL/URINARY TRACT ULTRASOUND COMPLETE  Comparison:  None.  Findings:  Right Kidney:  Measures 11.6 cm.  Normal in size and parenchymal echogenicity.  There is a 13 mm stone within the mid pole collecting system of the right kidney.  Right-sided Nephro ureteral stent is in place without evidence for hydronephrosis.  Left Kidney:  Left kidney measures 11.2 cm. A left- sidedpelvocaliectasis noted without evidence for hydronephrosis.  7 mm stone is noted within the midpole.  Bladder:  Collapsed around a Foley catheter balloon.  Other:  Ascites noted in the right lower quadrant.  IMPRESSION:  1.  No evidence for recurrent hydronephrosis after  placement of right ureteral stent   Original Report Authenticated By: Signa Kell, M.D.    Dg Chest Port 1 View  08/08/2012  *RADIOLOGY REPORT*  Clinical Data: Shortness of breath, possible edema  PORTABLE CHEST - 1 VIEW  Comparison: Portable chest x-ray of 08/06/2012  Findings: Volume loss at the right lung base and blunting of the right costophrenic angle appears stable most consistent with pleuroparenchymal scarring.  The left lung is clear.  Cardiomegaly is stable.  No acute bony abnormality is seen.  IMPRESSION: Stable cardiomegaly.  Stable scarring at the right lung base.   Original Report Authenticated By: Dwyane Dee, M.D.     Microbiology: Recent Results (from the past 240 hour(s))  URINE CULTURE     Status: None   Collection Time    08/07/12  1:28 PM      Result Value Range Status   Specimen Description URINE, CATHETERIZED   Final   Special Requests NONE   Final   Culture  Setup Time 08/07/2012 14:02   Final   Colony Count NO GROWTH   Final   Culture NO GROWTH   Final    Report Status 08/08/2012 FINAL   Final  MRSA PCR SCREENING     Status: None   Collection Time    08/07/12  1:30 PM      Result Value Range Status   MRSA by PCR NEGATIVE  NEGATIVE Final   Comment:            The GeneXpert MRSA Assay (FDA     approved for NASAL specimens     only), is one component of a     comprehensive MRSA colonization     surveillance program. It is not     intended to diagnose MRSA     infection nor to guide or     monitor treatment for     MRSA infections.  CULTURE, BLOOD (ROUTINE X 2)     Status: None   Collection Time    08/07/12  1:45 PM      Result Value Range Status   Specimen Description BLOOD LEFT ANTECUBITAL   Final   Special Requests BOTTLES DRAWN AEROBIC AND ANAEROBIC 10CC   Final   Culture  Setup Time 08/07/2012 17:25   Final   Culture     Final   Value:        BLOOD CULTURE RECEIVED NO GROWTH TO DATE CULTURE WILL BE HELD FOR 5 DAYS BEFORE ISSUING A FINAL NEGATIVE REPORT   Report Status PENDING   Incomplete  CULTURE, BLOOD (ROUTINE X 2)     Status: None   Collection Time    08/07/12  1:50 PM      Result Value Range Status   Specimen Description BLOOD LEFT HAND   Final   Special Requests BOTTLES DRAWN AEROBIC ONLY 2CC   Final   Culture  Setup Time 08/07/2012 17:25   Final   Culture     Final   Value:        BLOOD CULTURE RECEIVED NO GROWTH TO DATE CULTURE WILL BE HELD FOR 5 DAYS BEFORE ISSUING A FINAL NEGATIVE REPORT   Report Status PENDING   Incomplete  URINE CULTURE     Status: None   Collection Time    08/08/12  9:30 AM      Result Value Range Status   Specimen Description URINE, CLEAN CATCH   Final   Special Requests NONE   Final   Culture  Setup Time 08/08/2012 13:47   Final   Colony Count NO GROWTH   Final   Culture NO GROWTH   Final   Report Status 08/09/2012 FINAL   Final     Labs: Basic Metabolic Panel:  Recent Labs Lab 08/07/12 1625  08/08/12 1030 08/09/12 0540 08/10/12 0544 08/11/12 0425 08/12/12 0620  NA 133*  < >  129* 139 144 145 145  K 4.9  < > 4.3 4.7 4.1 4.3 4.0  CL 101  < > 95* 101 107 106 107  CO2 15*  < > 21 26 29 31 29   GLUCOSE 95  < > 100* 105* 93 101* 120*  BUN 58*  < > 69* 64* 50* 36* 27*  CREATININE 4.69*  < > 4.58* 3.69* 2.26* 1.58* 1.21*  CALCIUM 7.0*  < > 6.5* 7.0* 8.2* 8.8 9.2  PHOS 7.0*  --  7.2* 6.7* 4.6 3.5  --   < > = values in this interval not displayed. Liver Function Tests:  Recent Labs Lab 08/07/12 1625 08/08/12 1030 08/09/12 0540 08/10/12 0544 08/11/12 0425  ALBUMIN 2.3* 2.3* 2.3* 2.2* 2.4*   No results found for this basename: LIPASE, AMYLASE,  in the last 168 hours No results found for this basename: AMMONIA,  in the last 168 hours CBC:  Recent Labs Lab 08/07/12 1625 08/08/12 0147 08/09/12 0540 08/09/12 1446 08/10/12 0543 08/11/12 0425 08/12/12 0620  WBC 29.4* 26.7* 17.5* 15.8* 11.4* 10.0 12.1*  NEUTROABS  --  24.3*  --  13.4*  --   --   --   HGB 10.5* 9.7* 9.6* 9.7* 9.8* 10.0* 10.8*  HCT 31.8* 29.1* 28.4* 28.8* 29.9* 30.7* 33.0*  MCV 95.5 94.2 92.5 91.7 94.3 96.8 96.2  PLT 144* 141* 143* 121* 139* 144* 166   Cardiac Enzymes: No results found for this basename: CKTOTAL, CKMB, CKMBINDEX, TROPONINI,  in the last 168 hours BNP: BNP (last 3 results) No results found for this basename: PROBNP,  in the last 8760 hours CBG:  Recent Labs Lab 08/07/12 2059 08/08/12 0019 08/08/12 0352 08/08/12 0759 08/08/12 1122  GLUCAP 100* 98 106* 99 102*       Signed:  FELIZ ORTIZ, Daneil Beem  [facility] 08/12/2012, 12:34 PM

## 2012-08-12 NOTE — Progress Notes (Signed)
Subjective:  The patient denies any chest discomfort or shortness of breath.  She is tolerating low dose beta blocker.  Her systolic blood pressure is improving. Amlodipine was restarted yesterday.  Her renal function continues to improve.  Objective:  Vital Signs in the last 24 hours: Temp:  [98 F (36.7 C)-98.3 F (36.8 C)] 98 F (36.7 C) (04/13 0500) Pulse Rate:  [52-72] 59 (04/13 0500) Resp:  [18-20] 18 (04/13 0500) BP: (143-185)/(48-94) 147/89 mmHg (04/13 0500) SpO2:  [89 %-100 %] 98 % (04/13 0500) Weight:  [158 lb 11.7 oz (72 kg)] 158 lb 11.7 oz (72 kg) (04/13 0500)  Intake/Output from previous day:   Intake/Output from this shift:    . amLODipine  2.5 mg Oral Daily  . aspirin EC  325 mg Oral Daily  . heparin subcutaneous  5,000 Units Subcutaneous Q8H  . levofloxacin  250 mg Oral Daily  . metoprolol tartrate  25 mg Oral BID  . nystatin  5 mL Oral QID  . pantoprazole  40 mg Oral Daily  . simvastatin  20 mg Oral q1800      Physical Exam: The patient appears to be in no distress.  Head and neck exam reveals that the pupils are equal and reactive.  The extraocular movements are full.  There is no scleral icterus.  Mouth and pharynx are benign.  No lymphadenopathy.  No carotid bruits.  The jugular venous pressure is normal.  Thyroid is not enlarged or tender.  Chest reveals mild expiratory wheezes.  Heart reveals no abnormal lift or heave.  First and second heart sounds are normal.  There is no  gallop rub or click.  There is a grade 2/6 systolic ejection murmur at left sternal edge.  The abdomen is soft and nontender.  Bowel sounds are normoactive.  There is no hepatosplenomegaly or mass.  There are no abdominal bruits.  Extremities reveal no phlebitis or edema.  Pedal pulses are good.  There is no cyanosis or clubbing.  Neurologic exam is normal strength and no lateralizing weakness.  No sensory deficits.  Integument reveals no rash  Lab Results:  Recent  Labs  08/11/12 0425 08/12/12 0620  WBC 10.0 12.1*  HGB 10.0* 10.8*  PLT 144* 166    Recent Labs  08/11/12 0425 08/12/12 0620  NA 145 145  K 4.3 4.0  CL 106 107  CO2 31 29  GLUCOSE 101* 120*  BUN 36* 27*  CREATININE 1.58* 1.21*   No results found for this basename: TROPONINI, CK, MB,  in the last 72 hours Hepatic Function Panel  Recent Labs  08/11/12 0425  ALBUMIN 2.4*   No results found for this basename: CHOL,  in the last 72 hours No results found for this basename: PROTIME,  in the last 72 hours  Imaging: No results found.  Cardiac Studies: Telemetry shows sinus bradycardia.  Short run of 5 beats of nonsustained V. tach during the night--asymptomatic  2-D echocardiogram on 08/07/12: Study Conclusions  - Left ventricle: The cavity size was normal. Wall thickness was increased in a pattern of mild LVH. Systolic function was normal. The estimated ejection fraction was in the range of 50% to 55%. Wall motion was normal; there were no regional wall motion abnormalities. Features are consistent with a pseudonormal left ventricular filling pattern, with concomitant abnormal relaxation and increased filling pressure (grade 2 diastolic dysfunction). Doppler parameters are consistent with high ventricular filling pressure. - Mitral valve: Calcified annulus. - Left atrium: The atrium  was moderately dilated. - Pulmonary arteries: Systolic pressure was mildly increased. PA peak pressure: 36mm Hg (S).   Assessment/Plan:  Non-Q wave myocardial infarction secondary to supply demand mismatch secondary to stress of her urologic procedure and her sepsis. Acute renal failure, improving Systolic hypertension improving  Plan: Continue current cardiac medication amlodipine, aspirin, metoprolol tartrate, and simvastatin   LOS: 5 days    Cassell Clement 08/12/2012, 9:25 AM

## 2012-08-12 NOTE — Plan of Care (Signed)
Problem: Discharge Progression Outcomes Goal: Home Health if indicated Outcome: Completed/Met Date Met:  08/12/12 [facility]

## 2012-08-12 NOTE — Progress Notes (Signed)
[facility] PROGRESS NOTE Assessment/Plan:  Pyelonephritis, acute/  Septic shock: - Tansferred from [facility] hospital, with hypoxia, hypotension, and features of septic shock, in the setting of UTI - Patient is on Meropenem, day# 6.  - Blood and urine cultures have remained negative.  - Transition to oral Levaquin monotherapy from 08/12/12, for another 5 days  Obstructive uropathy/Hydronephrosis: - Secondary to recurrent calculi. At [facility], patient was found to have right hydronephrosis and underwent placement of a right ureteral stent on 08/06/12, bypassing 2 stones, with prurulent urine behind the stones.  - This is now resolved, Renal US revealed no recurrent hydronephrosis. Patient will need urology follow up, on discharge  AKI/ARF:  - Baseline creatinine is 1.4, and at the time of transfer to [facility], creatinine was 4.5.  - This was multifactorial (sepsis, hypotension, obstruction, contrast, ARB, in diabetic pt).  - Today, creatinine is 1.58, ie, close to baseline.   NSTEMI (non-ST elevated myocardial infarction)/Chronic diastolic dysfunction/HTN - Cardiology consultation,  likely related to supply demand mismatch, related to stress of her urologic procedure and sepsis.  - She is not considered a cath candidate.  - A probable outpatient stress test may be arranged in due course. On ASA and beta-blocker, per cardiology  Acute Respiratory Distress/Hypoxic respiratory failure: - On transfer pient had required BIPAP pre-transfer, and is felt to be multifactorial. - lso NSTEMI, consider possible influence sedation given for renal stent placement. - As of 08/08/12, respiratory failure had resolved Patient is now saturating at 94%-97% on 2L.     Code Status: Full Code.  Family Communication:  Disposition Plan: to be determined. Nearing discharge. Awaiting PT/OT eval and recommendations. May need home O2, and need urology follow up for urolithiasis/stent.   Consultation: Dr  [name], nephrologist. Cardiology  Procedures:  CXRs.  Renal U/S.  Antibiotics:  Meropenem 08/07/12-08/11/12.  Levaquin 08/12/12>>>4.18.2014   HPI/Subjective: No compalins  Objective: Filed Vitals:   08/11/12 2138 08/11/12 2300 08/12/12 0500 08/12/12 1008  BP: 152/82  147/89 154/62  Pulse: 57 60 59 57  Temp: 98.3 F (36.8 C)  98 F (36.7 C)   TempSrc: Oral  Oral   Resp: 18  18   Height:      Weight:   72 kg (158 lb 11.7 oz)   SpO2: 100%  98%    No intake or output data in the 24 hours ending 08/12/12 1157 Filed Weights   08/10/12 0510 08/11/12 0410 08/12/12 0500  Weight: 72.122 kg (159 lb) 72.122 kg (159 lb) 72 kg (158 lb 11.7 oz)    Exam:  General: Alert, awake, oriented x3, in no acute distress.  HEENT: No bruits, no goiter.  Heart: Regular rate and rhythm, without murmurs, rubs, gallops.  Lungs: Good air movement, clear to auscultation  Abdomen: Soft, nontender, nondistended, positive bowel sounds.  Neuro: Grossly intact, nonfocal.   Data Reviewed: Basic Metabolic Panel:  Recent Labs Lab 08/07/12 1625  08/08/12 1030 08/09/12 0540 08/10/12 0544 08/11/12 0425 08/12/12 0620  NA 133*  < > 129* 139 144 145 145  K 4.9  < > 4.3 4.7 4.1 4.3 4.0  CL 101  < > 95* 101 107 106 107  CO2 15*  < > 21 26 29 31 29   GLUCOSE 95  < > 100* 105* 93 101* 120*  BUN 58*  < > 69* 64* 50* 36* 27*  CREATININE 4.69*  < > 4.58* 3.69* 2.26* 1.58* 1.21*  CALCIUM 7.0*  < > 6.5* 7.0* 8.2* 8.8  9.2  PHOS 7.0*  --  7.2* 6.7* 4.6 3.5  --   < > = values in this interval not displayed. Liver Function Tests:  Recent Labs Lab 08/07/12 1625 08/08/12 1030 08/09/12 0540 08/10/12 0544 08/11/12 0425  ALBUMIN 2.3* 2.3* 2.3* 2.2* 2.4*   No results found for this basename: LIPASE, AMYLASE,  in the last 168 hours No results found for this basename: AMMONIA,  in the last 168 hours CBC:  Recent Labs Lab 08/07/12 1625 08/08/12 0147 08/09/12 0540 08/09/12 1446 08/10/12 0543  08/11/12 0425 08/12/12 0620  WBC 29.4* 26.7* 17.5* 15.8* 11.4* 10.0 12.1*  NEUTROABS  --  24.3*  --  13.4*  --   --   --   HGB 10.5* 9.7* 9.6* 9.7* 9.8* 10.0* 10.8*  HCT 31.8* 29.1* 28.4* 28.8* 29.9* 30.7* 33.0*  MCV 95.5 94.2 92.5 91.7 94.3 96.8 96.2  PLT 144* 141* 143* 121* 139* 144* 166   Cardiac Enzymes: No results found for this basename: CKTOTAL, CKMB, CKMBINDEX, TROPONINI,  in the last 168 hours BNP (last 3 results) No results found for this basename: PROBNP,  in the last 8760 hours CBG:  Recent Labs Lab 08/07/12 2059 08/08/12 0019 08/08/12 0352 08/08/12 0759 08/08/12 1122  GLUCAP 100* 98 106* 99 102*    Recent Results (from the past 240 hour(s))  URINE CULTURE     Status: None   Collection Time    08/07/12  1:28 PM      Result Value Range Status   Specimen Description URINE, CATHETERIZED   Final   Special Requests NONE   Final   Culture  Setup Time 08/07/2012 14:02   Final   Colony Count NO GROWTH   Final   Culture NO GROWTH   Final   Report Status 08/08/2012 FINAL   Final  MRSA PCR SCREENING     Status: None   Collection Time    08/07/12  1:30 PM      Result Value Range Status   MRSA by PCR NEGATIVE  NEGATIVE Final   Comment:            The GeneXpert MRSA Assay (FDA     approved for NASAL specimens     only), is one component of a     comprehensive MRSA colonization     surveillance program. It is not     intended to diagnose MRSA     infection nor to guide or     monitor treatment for     MRSA infections.  CULTURE, BLOOD (ROUTINE X 2)     Status: None   Collection Time    08/07/12  1:45 PM      Result Value Range Status   Specimen Description BLOOD LEFT ANTECUBITAL   Final   Special Requests BOTTLES DRAWN AEROBIC AND ANAEROBIC 10CC   Final   Culture  Setup Time 08/07/2012 17:25   Final   Culture     Final   Value:        BLOOD CULTURE RECEIVED NO GROWTH TO DATE CULTURE WILL BE HELD FOR 5 DAYS BEFORE ISSUING A FINAL NEGATIVE REPORT   Report Status  PENDING   Incomplete  CULTURE, BLOOD (ROUTINE X 2)     Status: None   Collection Time    08/07/12  1:50 PM      Result Value Range Status   Specimen Description BLOOD LEFT HAND   Final   Special Requests BOTTLES DRAWN AEROBIC ONLY 2CC  Final   Culture  Setup Time 08/07/2012 17:25   Final   Culture     Final   Value:        BLOOD CULTURE RECEIVED NO GROWTH TO DATE CULTURE WILL BE HELD FOR 5 DAYS BEFORE ISSUING A FINAL NEGATIVE REPORT   Report Status PENDING   Incomplete  URINE CULTURE     Status: None   Collection Time    08/08/12  9:30 AM      Result Value Range Status   Specimen Description URINE, CLEAN CATCH   Final   Special Requests NONE   Final   Culture  Setup Time 08/08/2012 13:47   Final   Colony Count NO GROWTH   Final   Culture NO GROWTH   Final   Report Status 08/09/2012 FINAL   Final     Studies: No results found.  Scheduled Meds: . amLODipine  2.5 mg Oral Daily  . aspirin EC  325 mg Oral Daily  . heparin subcutaneous  5,000 Units Subcutaneous Q8H  . levofloxacin  250 mg Oral Daily  . metoprolol tartrate  25 mg Oral BID  . nystatin  5 mL Oral QID  . pantoprazole  40 mg Oral Daily  . simvastatin  20 mg Oral q1800   Continuous Infusions:    Marinda Elk  [facility] Pager [phone]. If 8PM-8AM, please contact night-coverage at [email or web address], password [facility] 08/12/2012, 11:57 AM  LOS: 5 days

## 2012-08-13 LAB — CULTURE, BLOOD (ROUTINE X 2): Culture: NO GROWTH

## 2012-08-15 ENCOUNTER — Other Ambulatory Visit: Payer: Self-pay | Admitting: Family Medicine

## 2012-08-15 DIAGNOSIS — Z0289 Encounter for other administrative examinations: Secondary | ICD-10-CM

## 2012-09-10 ENCOUNTER — Encounter: Payer: Self-pay | Admitting: *Deleted

## 2012-09-10 NOTE — Progress Notes (Signed)
PT'S DAUGHTER CALLED TRIAGE QUESTIONING WHAT SHE SHOULD DO ABOUT HER MOTHER. SHE WAS IN HOSP ([facility]) RECENTLY FOR A KIDNEY STENT AND THEN A HEART ATTACK. SHE NOW IS HAVING NAUSEA, SOME VOMITING, AND DIARRHEA. POSSIBLE FEVER- HAS HAD CHILLS OFF AND ON. TRIAGE INFORMED DAUGHTER OF RECENT C/O STOMACH BUG IN THE COMMUNITY AND ADVISED HER TO HAVE ONLY CLEAR LIQUIDS FOR THE REST OF THE EVENING AND POSSIBLY TRY IMODIUM FOR THE DIARRHEA IF NEEDED. IF SHE IS NO BETTER BY THE MORNING TO CALL FOR A APPT ON 5/13.-JHB

## 2012-09-25 ENCOUNTER — Encounter: Payer: Self-pay | Admitting: Family Medicine

## 2012-09-25 ENCOUNTER — Ambulatory Visit (INDEPENDENT_AMBULATORY_CARE_PROVIDER_SITE_OTHER): Payer: Medicare Other | Admitting: Family Medicine

## 2012-09-25 VITALS — BP 126/70 | HR 67 | Temp 98.2°F | Ht 61.0 in | Wt 134.2 lb

## 2012-09-25 DIAGNOSIS — F17200 Nicotine dependence, unspecified, uncomplicated: Secondary | ICD-10-CM

## 2012-09-25 DIAGNOSIS — J9600 Acute respiratory failure, unspecified whether with hypoxia or hypercapnia: Secondary | ICD-10-CM

## 2012-09-25 DIAGNOSIS — N179 Acute kidney failure, unspecified: Secondary | ICD-10-CM

## 2012-09-25 DIAGNOSIS — A419 Sepsis, unspecified organism: Secondary | ICD-10-CM | POA: Insufficient documentation

## 2012-09-25 DIAGNOSIS — N200 Calculus of kidney: Secondary | ICD-10-CM

## 2012-09-25 DIAGNOSIS — Z720 Tobacco use: Secondary | ICD-10-CM | POA: Insufficient documentation

## 2012-09-25 DIAGNOSIS — I214 Non-ST elevation (NSTEMI) myocardial infarction: Secondary | ICD-10-CM

## 2012-09-25 DIAGNOSIS — N390 Urinary tract infection, site not specified: Secondary | ICD-10-CM

## 2012-09-25 DIAGNOSIS — Z8742 Personal history of other diseases of the female genital tract: Secondary | ICD-10-CM | POA: Insufficient documentation

## 2012-09-25 DIAGNOSIS — R197 Diarrhea, unspecified: Secondary | ICD-10-CM

## 2012-09-25 DIAGNOSIS — R1115 Cyclical vomiting syndrome unrelated to migraine: Secondary | ICD-10-CM | POA: Insufficient documentation

## 2012-09-25 DIAGNOSIS — R350 Frequency of micturition: Secondary | ICD-10-CM

## 2012-09-25 DIAGNOSIS — IMO0001 Reserved for inherently not codable concepts without codable children: Secondary | ICD-10-CM | POA: Insufficient documentation

## 2012-09-25 LAB — POCT URINALYSIS DIPSTICK
Bilirubin, UA: NEGATIVE
Glucose, UA: NEGATIVE
Ketones, UA: NEGATIVE
Nitrite, UA: POSITIVE
Spec Grav, UA: 1.01
Urobilinogen, UA: NEGATIVE
pH, UA: 5

## 2012-09-25 LAB — POCT CBC
Granulocyte percent: 75.7 %G (ref 37–80)
HCT, POC: 32.6 % — AB (ref 37.7–47.9)
Hemoglobin: 10.7 g/dL — AB (ref 12.2–16.2)
Lymph, poc: 3 (ref 0.6–3.4)
MCH, POC: 30.7 pg (ref 27–31.2)
MCHC: 32.9 g/dL (ref 31.8–35.4)
MCV: 93.3 fL (ref 80–97)
MPV: 7.2 fL (ref 0–99.8)
POC Granulocyte: 11.3 — AB (ref 2–6.9)
POC LYMPH PERCENT: 20.2 %L (ref 10–50)
Platelet Count, POC: 512 10*3/uL — AB (ref 142–424)
RBC: 3.5 M/uL — AB (ref 4.04–5.48)
RDW, POC: 15.1 %
WBC: 14.9 10*3/uL — AB (ref 4.6–10.2)

## 2012-09-25 LAB — COMPLETE METABOLIC PANEL WITH GFR
ALT: 10 U/L (ref 0–35)
AST: 15 U/L (ref 0–37)
Albumin: 3 g/dL — ABNORMAL LOW (ref 3.5–5.2)
Alkaline Phosphatase: 89 U/L (ref 39–117)
BUN: 20 mg/dL (ref 6–23)
CO2: 22 mEq/L (ref 19–32)
Calcium: 8.9 mg/dL (ref 8.4–10.5)
Chloride: 107 mEq/L (ref 96–112)
Creat: 1.44 mg/dL — ABNORMAL HIGH (ref 0.50–1.10)
GFR, Est African American: 44 mL/min — ABNORMAL LOW
GFR, Est Non African American: 38 mL/min — ABNORMAL LOW
Glucose, Bld: 96 mg/dL (ref 70–99)
Potassium: 4.5 mEq/L (ref 3.5–5.3)
Sodium: 141 mEq/L (ref 135–145)
Total Bilirubin: 0.5 mg/dL (ref 0.3–1.2)
Total Protein: 6.7 g/dL (ref 6.0–8.3)

## 2012-09-25 LAB — LIPASE: Lipase: 21 U/L (ref 0–75)

## 2012-09-25 LAB — POCT UA - MICROSCOPIC ONLY
Casts, Ur, LPF, POC: NEGATIVE
Crystals, Ur, HPF, POC: NEGATIVE
Yeast, UA: NEGATIVE

## 2012-09-25 LAB — AMYLASE: Amylase: 30 U/L (ref 0–105)

## 2012-09-25 MED ORDER — LEVOFLOXACIN 250 MG PO TABS: 500.0000 mg | ORAL_TABLET | Freq: Every day | ORAL | Status: DC

## 2012-09-25 MED ORDER — ONDANSETRON HCL 4 MG PO TABS: 4.0000 mg | ORAL_TABLET | Freq: Three times a day (TID) | ORAL | Status: DC | PRN

## 2012-09-25 NOTE — Progress Notes (Signed)
Patient ID: Alexa Key, female   DOB: Oct 26, 1950, 62 y.o.   MRN: [id number] GYN exam. PELVIC EXAM:  EXTERNAL Vulva:: Normal  SPECULUM: Vagina:abnormal with yeloow discharge and bubbles and actual fistulous opening could not be discerned Cervix: Normal appearing  BIMANUAL: CMT: Negative Adnexae: Normal     No massesUterus: Anteverted/ retroverted?  Normal Size no Masses Rectovaginal: Normal Hemoccult: Negative  Alexa Key, M.D.

## 2012-09-25 NOTE — Progress Notes (Signed)
Patient ID: Alexa Key, female   DOB: 1951-03-25, 62 y.o.   MRN: [id number] SUBJECTIVE: HPI: She had a kidney stone attack 08/05/2012. Cystoscopy performed 08/06/2012. Had a stent put in and pus came out and kidney failure. And the sepsis caused renal failure, an acute MI and Diverticulitis. Was d/c from [facility] 4 /8 / 2014 Went to live with a family member. Sees Dr Antoine Poche tomorrow. Then if and when cleared the next step is lithotripsy due to a 7 mm stone. Went to her own home on 09/07/2012. Cannot control her BM defecates on herself. Diarrhea. Has dry heaves sometimes. Also for several months has had gas coming out of the vagina and a discharge at times.   PMH/PSH: reviewed/updated in Epic  SH/FH: reviewed/updated in Epic  Allergies: reviewed/updated in Epic  Medications: reviewed/updated in Epic  Immunizations: reviewed/updated in Epic  ROS: As above in the HPI. All other systems are stable or negative.  OBJECTIVE: APPEARANCE:  Patient in no acute distress.The patient appeared well nourished and normally developed. Acyanotic. Waist: VITAL SIGNS:BP 126/70  Pulse 67  Temp(Src) 98.2 F (36.8 C) (Oral)  Ht 5\' 1"  (1.549 m)  Wt 134 lb 3.2 oz (60.873 kg)  BMI 25.37 kg/m2 WF  SKIN: warm and  Dry without overt rashes, tattoos and scars  HEAD and Neck: without JVD, Head and scalp: normal Eyes:No scleral icterus. Fundi normal, eye movements normal. Ears: Auricle normal, canal normal, Tympanic membranes normal, insufflation normal. Nose: normal Throat: normal Neck & thyroid: normal  CHEST & LUNGS: Chest wall: normal Lungs: Clear  CVS: Reveals the PMI to be normally located. Regular rhythm, First and Second Heart sounds are normal,  absence of murmurs, rubs or gallops. Peripheral vasculature: Radial pulses: normal Dorsal pedis pulses: normal Posterior pulses: normal  ABDOMEN:  Appearance: normal Benign,, no organomegaly, no masses, no Abdominal Aortic enlargement. No  Guarding , no rebound. No Bruits. Bowel sounds: normal  RECTAL: N/A GU: N/A  EXTREMETIES: nonedematous. Both Femoral and Pedal pulses are normal.  MUSCULOSKELETAL:  Spine: normal Joints: intact  NEUROLOGIC: oriented to time,place and person; nonfocal. Strength is normal Sensory is normal Reflexes are normal Cranial Nerves are normal.  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 Frequency - Plan: POCT urinalysis dipstick, POCT UA - Microscopic Only, CANCELED: Urine culture  Emesis, persistent - Plan: POCT CBC, COMPLETE METABOLIC PANEL WITH GFR, Urine culture, Amylase, Lipase, ondansetron (ZOFRAN) 4 MG tablet, CANCELED: POCT UA - Microscopic Only, CANCELED: POCT urinalysis dipstick, DISCONTINUED: ondansetron (ZOFRAN) 4 MG tablet  Kidney stone  Urosepsis - Plan: POCT CBC, Urine culture, levofloxacin (LEVAQUIN) 250 MG tablet, CANCELED: POCT UA - Microscopic Only, CANCELED: POCT urinalysis dipstick  Septic shock(785.52) - Plan: POCT CBC, COMPLETE METABOLIC PANEL WITH GFR, Urine culture  NSTEMI (non-ST elevated myocardial infarction)  Acute respiratory failure  Acute renal failure - Plan: COMPLETE METABOLIC PANEL WITH GFR, Urine culture  Tobacco user  History of vaginal discharge - Plan: Ambulatory referral to Gynecology  Diarrhea - Plan: Clostridium difficile toxin, Ova and parasite screen, Stool culture    PLAN: Orders Placed This Encounter  Procedures  . Urine culture  . Clostridium difficile toxin  . Ova and parasite screen  . Stool culture  . COMPLETE METABOLIC PANEL WITH GFR  . Amylase  . Lipase  . Ambulatory referral to Gynecology    Referral Priority:  Routine    Referral Type:  Consultation    Referral Reason:  Specialty Services Required    Requested Specialty:  Gynecology  Number of Visits Requested:  1  . POCT urinalysis dipstick  . POCT UA - Microscopic Only  . POCT CBC     Meds ordered this encounter  Medications  . DISCONTD:  ondansetron (ZOFRAN) 4 MG tablet    Sig: Take 1 tablet (4 mg total) by mouth every 8 (eight) hours as needed for nausea.    Dispense:  20 tablet    Refill:  0  . levofloxacin (LEVAQUIN) 250 MG tablet    Sig: Take 2 tablets (500 mg total) by mouth daily.    Dispense:  5 tablet    Refill:  0  . ondansetron (ZOFRAN) 4 MG tablet    Sig: Take 1 tablet (4 mg total) by mouth every 8 (eight) hours as needed for nausea.    Dispense:  20 tablet    Refill:  0    Results for orders placed in visit on 09/25/12  POCT URINALYSIS DIPSTICK      Result Value Range   Color, UA gold     Clarity, UA cloudy     Glucose, UA negative     Bilirubin, UA negative     Ketones, UA negative     Spec Grav, UA 1.010     Blood, UA large     pH, UA 5.0     Protein, UA 4+     Urobilinogen, UA negative     Nitrite, UA positive     Leukocytes, UA large (3+)    POCT UA - MICROSCOPIC ONLY      Result Value Range   WBC, Ur, HPF, POC tntc     RBC, urine, microscopic tntc     Bacteria, U Microscopic many     Mucus, UA few     Epithelial cells, urine per micros few     Crystals, Ur, HPF, POC negative     Casts, Ur, LPF, POC negative     Yeast, UA negative    POCT CBC      Result Value Range   WBC 14.9 (*) 4.6 - 10.2 K/uL   Lymph, poc 3.0  0.6 - 3.4   POC LYMPH PERCENT 20.2  10 - 50 %L   POC Granulocyte 11.3 (*) 2 - 6.9   Granulocyte percent 75.7  37 - 80 %G   RBC 3.5 (*) 4.04 - 5.48 M/uL   Hemoglobin 10.7 (*) 12.2 - 16.2 g/dL   HCT, POC 16.1 (*) 09.6 - 47.9 %   MCV 93.3  80 - 97 fL   MCH, POC 30.7  27 - 31.2 pg   MCHC 32.9  31.8 - 35.4 g/dL   RDW, POC 04.5     Platelet Count, POC 512.0 (*) 142 - 424 K/uL   MPV 7.2  0 - 99.8 fL    Discussed with patient and  Daughter. Patient's complex medical problems. Recent obstructive nephrolithiasis, with pyelonephritis, complicated by an AMI. And having symptoms  Suggesting rectovaginal fistula.  Smoking cessation imperative. Counselled.  RTc in  1  week. Refer to GYN. Keep specialty appointments.  Reviewed records from [facility].  Micco P. Modesto Charon, M.D.

## 2012-09-25 NOTE — Patient Instructions (Addendum)
Smoking Cessation Quitting smoking is important to your health and has many advantages. However, it is not always easy to quit since nicotine is a very addictive drug. Often times, people try 3 times or more before being able to quit. This document explains the best ways for you to prepare to quit smoking. Quitting takes hard work and a lot of effort, but you can do it. ADVANTAGES OF QUITTING SMOKING  You will live longer, feel better, and live better.  Your body will feel the impact of quitting smoking almost immediately.  Within 20 minutes, blood pressure decreases. Your pulse returns to its normal level.  After 8 hours, carbon monoxide levels in the blood return to normal. Your oxygen level increases.  After 24 hours, the chance of having a heart attack starts to decrease. Your breath, hair, and body stop smelling like smoke.  After 48 hours, damaged nerve endings begin to recover. Your sense of taste and smell improve.  After 72 hours, the body is virtually free of nicotine. Your bronchial tubes relax and breathing becomes easier.  After 2 to 12 weeks, lungs can hold more air. Exercise becomes easier and circulation improves.  The risk of having a heart attack, stroke, cancer, or lung disease is greatly reduced.  After 1 year, the risk of coronary heart disease is cut in half.  After 5 years, the risk of stroke falls to the same as a nonsmoker.  After 10 years, the risk of lung cancer is cut in half and the risk of other cancers decreases significantly.  After 15 years, the risk of coronary heart disease drops, usually to the level of a nonsmoker.  If you are pregnant, quitting smoking will improve your chances of having a healthy baby.  The people you live with, especially any children, will be healthier.  You will have extra money to spend on things other than cigarettes. QUESTIONS TO THINK ABOUT BEFORE ATTEMPTING TO QUIT You may want to talk about your answers with your  caregiver.  Why do you want to quit?  If you tried to quit in the past, what helped and what did not?  What will be the most difficult situations for you after you quit? How will you plan to handle them?  Who can help you through the tough times? Your family? Friends? A caregiver?  What pleasures do you get from smoking? What ways can you still get pleasure if you quit? Here are some questions to ask your caregiver:  How can you help me to be successful at quitting?  What medicine do you think would be best for me and how should I take it?  What should I do if I need more help?  What is smoking withdrawal like? How can I get information on withdrawal? GET READY  Set a quit date.  Change your environment by getting rid of all cigarettes, ashtrays, matches, and lighters in your home, car, or work. Do not let people smoke in your home.  Review your past attempts to quit. Think about what worked and what did not. GET SUPPORT AND ENCOURAGEMENT You have a better chance of being successful if you have help. You can get support in many ways.  Tell your family, friends, and co-workers that you are going to quit and need their support. Ask them not to smoke around you.  Get individual, group, or telephone counseling and support. Programs are available at local hospitals and health centers. Call your local health department for   information about programs in your area.  Spiritual beliefs and practices may help some smokers quit.  Download a "quit meter" on your computer to keep track of quit statistics, such as how long you have gone without smoking, cigarettes not smoked, and money saved.  Get a self-help book about quitting smoking and staying off of tobacco. LEARN NEW SKILLS AND BEHAVIORS  Distract yourself from urges to smoke. Talk to someone, go for a walk, or occupy your time with a task.  Change your normal routine. Take a different route to work. Drink tea instead of coffee.  Eat breakfast in a different place.  Reduce your stress. Take a hot bath, exercise, or read a book.  Plan something enjoyable to do every day. Reward yourself for not smoking.  Explore interactive web-based programs that specialize in helping you quit. GET MEDICINE AND USE IT CORRECTLY Medicines can help you stop smoking and decrease the urge to smoke. Combining medicine with the above behavioral methods and support can greatly increase your chances of successfully quitting smoking.  Nicotine replacement therapy helps deliver nicotine to your body without the negative effects and risks of smoking. Nicotine replacement therapy includes nicotine gum, lozenges, inhalers, nasal sprays, and skin patches. Some may be available over-the-counter and others require a prescription.  Antidepressant medicine helps people abstain from smoking, but how this works is unknown. This medicine is available by prescription.  Nicotinic receptor partial agonist medicine simulates the effect of nicotine in your brain. This medicine is available by prescription. Ask your caregiver for advice about which medicines to use and how to use them based on your health history. Your caregiver will tell you what side effects to look out for if you choose to be on a medicine or therapy. Carefully read the information on the package. Do not use any other product containing nicotine while using a nicotine replacement product.  RELAPSE OR DIFFICULT SITUATIONS Most relapses occur within the first 3 months after quitting. Do not be discouraged if you start smoking again. Remember, most people try several times before finally quitting. You may have symptoms of withdrawal because your body is used to nicotine. You may crave cigarettes, be irritable, feel very hungry, cough often, get headaches, or have difficulty concentrating. The withdrawal symptoms are only temporary. They are strongest when you first quit, but they will go away within  10 14 days. To reduce the chances of relapse, try to:  Avoid drinking alcohol. Drinking lowers your chances of successfully quitting.  Reduce the amount of caffeine you consume. Once you quit smoking, the amount of caffeine in your body increases and can give you symptoms, such as a rapid heartbeat, sweating, and anxiety.  Avoid smokers because they can make you want to smoke.  Do not let weight gain distract you. Many smokers will gain weight when they quit, usually less than 10 pounds. Eat a healthy diet and stay active. You can always lose the weight gained after you quit.  Find ways to improve your mood other than smoking. FOR MORE INFORMATION  www.smokefree.gov  Document Released: 04/12/2001 Document Revised: 10/18/2011 Document Reviewed: 07/28/2011 [employer] Patient Information 2014 [employer].  

## 2012-09-26 ENCOUNTER — Ambulatory Visit (INDEPENDENT_AMBULATORY_CARE_PROVIDER_SITE_OTHER): Payer: Medicare Other | Admitting: Cardiology

## 2012-09-26 ENCOUNTER — Other Ambulatory Visit: Payer: Self-pay

## 2012-09-26 ENCOUNTER — Encounter: Payer: Self-pay | Admitting: Cardiology

## 2012-09-26 ENCOUNTER — Other Ambulatory Visit (INDEPENDENT_AMBULATORY_CARE_PROVIDER_SITE_OTHER): Payer: Medicare Other | Admitting: Family Medicine

## 2012-09-26 VITALS — BP 160/83 | HR 65 | Ht 61.0 in | Wt 136.8 lb

## 2012-09-26 DIAGNOSIS — N390 Urinary tract infection, site not specified: Secondary | ICD-10-CM

## 2012-09-26 DIAGNOSIS — A419 Sepsis, unspecified organism: Secondary | ICD-10-CM

## 2012-09-26 DIAGNOSIS — I219 Acute myocardial infarction, unspecified: Secondary | ICD-10-CM

## 2012-09-26 MED ORDER — LEVOFLOXACIN 500 MG PO TABS: 500.0000 mg | ORAL_TABLET | Freq: Every day | ORAL | Status: DC

## 2012-09-26 NOTE — Patient Instructions (Addendum)
The current medical regimen is effective;  continue present plan and medications.  Your physician has requested that you have a lexiscan myoview. For further information please visit www.cardiosmart.org. Please follow instruction sheet, as given.  Follow up in 1 year with Dr Hochrein.  You will receive a letter in the mail 2 months before you are due.  Please call us when you receive this letter to schedule your follow up appointment.  

## 2012-09-26 NOTE — Progress Notes (Signed)
HPI The patient presents for followup after a hospitalization earlier this year. She was admitted with urinary infection and septic shock. She had an elevation felt to be secondary to the acute illness. However, because of renal insufficiency she was managed conservatively.  Since going home though she has had no further cardiovascular symptoms. She says she is back home. She denies any ongoing symptoms such as chest pressure, neck or arm discomfort. She has had no palpitations, presyncope or syncope. She has had no weight gain or edema. She did think about stopping smoking but she is back to smoking cigarettes. She does have another urinary infection and was just started back on antibiotics. She is wanting to have further urologic procedure to treat his stent that is in place in her right ureter.  Of note she has a report of previous PCI or coronary lesion. However, I have yet to find details on this.  Allergies  Allergen Reactions  . Penicillins Rash  . Sulfa Antibiotics Rash    Current Outpatient Prescriptions  Medication Sig Dispense Refill  . amLODipine (NORVASC) 2.5 MG tablet Take 2.5 mg by mouth daily.      Marland Kitchen aspirin 325 MG tablet Take 325 mg by mouth daily.      . cholecalciferol (VITAMIN D) 1000 UNITS tablet Take 2,000 Units by mouth daily.      . fenofibrate 160 MG tablet Take 160 mg by mouth daily.      Marland Kitchen levofloxacin (LEVAQUIN) 500 MG tablet Take 1 tablet (500 mg total) by mouth daily.  7 tablet  0  . losartan (COZAAR) 100 MG tablet Take 100 mg by mouth daily.      . metoprolol tartrate (LOPRESSOR) 25 MG tablet Take 1 tablet (25 mg total) by mouth 2 (two) times daily.  30 tablet  0  . nicotine ([facility] NICOTINE) 21 mg/24hr patch Place 1 patch onto the skin daily.  28 patch  0  . omeprazole (PRILOSEC) 20 MG capsule TAKE  ONE CAPSULE BY MOUTH EVERY MORNING  30 capsule  1  . ondansetron (ZOFRAN) 4 MG tablet Take 1 tablet (4 mg total) by mouth every 8 (eight) hours as needed for nausea.   20 tablet  0  . pravastatin (PRAVACHOL) 40 MG tablet Take 40 mg by mouth every evening.       No current facility-administered medications for this visit.    Past Medical History  Diagnosis Date  . CAD (coronary artery disease)   . PVD (peripheral vascular disease)   . CVA (cerebral infarction)     TIAx2  . HTN (hypertension)   . HLD (hyperlipidemia)   . Tobacco abuse   . Diverticulitis 08/04/12  . S/P partial lobectomy of lung 1997    per [facility], incidental finding, path c/w benign lesion  . Stroke   . COPD (chronic obstructive pulmonary disease)   . Shortness of breath   . Fibromyalgia     Past Surgical History  Procedure Laterality Date  . Hysterectomoy    . Lumbar spine surgery    . Carpal tunnel release      ROS:  As stated in the HPI and negative for all other systems.  PHYSICAL EXAM BP 160/83  Pulse 65  Ht 5\' 1"  (1.549 m)  Wt 136 lb 12.8 oz (62.052 kg)  BMI 25.86 kg/m2 GENERAL:  Well appearing HEENT:  Pupils equal round and reactive, fundi not visualized, oral mucosa unremarkable NECK:  No jugular venous distention, waveform within normal limits,  carotid upstroke brisk and symmetric, slight right carotid bruit, no thyromegaly LYMPHATICS:  No cervical, inguinal adenopathy LUNGS:  Clear to auscultation bilaterally BACK:  No CVA tenderness CHEST:  Unremarkable HEART:  PMI not displaced or sustained,S1 and S2 within normal limits, no S3, no S4, no clicks, no rubs, no murmurs ABD:  Flat, positive bowel sounds normal in frequency in pitch, no bruits, no rebound, no guarding, no midline pulsatile mass, no hepatomegaly, no splenomegaly EXT:  2 plus pulses throughout, no edema, no cyanosis no clubbing SKIN:  No rashes no nodules NEURO:  Cranial nerves II through XII grossly intact, motor grossly intact throughout PSYCH:  Cognitively intact, oriented to person place and time  ASSESSMENT AND PLAN  CAD:  The patient reluctantly agrees to consent to a stress perfusion  imaging which would be necessary as a preprocedural clearance prior to any further urologic procedures. She will have to have a Lexiscan Myoview as I am not sure she could complete a treadmill.  TOBACCO:  We talked at length about this and she's not committed to stopping smoking.  HTN:  Her blood pressure is well controlled. She will continue the meds as listed.  CAROTID BRUIT:  The patient has slight bruits and she will have a carotid Doppler.

## 2012-09-27 LAB — URINE CULTURE: Colony Count: >=100000

## 2012-09-28 ENCOUNTER — Other Ambulatory Visit: Payer: Self-pay | Admitting: Family Medicine

## 2012-09-28 ENCOUNTER — Other Ambulatory Visit (INDEPENDENT_AMBULATORY_CARE_PROVIDER_SITE_OTHER): Payer: Medicare Other

## 2012-09-28 DIAGNOSIS — R197 Diarrhea, unspecified: Secondary | ICD-10-CM

## 2012-09-30 ENCOUNTER — Telehealth: Payer: Self-pay | Admitting: Family Medicine

## 2012-09-30 ENCOUNTER — Other Ambulatory Visit: Payer: Self-pay | Admitting: Family Medicine

## 2012-09-30 DIAGNOSIS — N390 Urinary tract infection, site not specified: Secondary | ICD-10-CM

## 2012-09-30 DIAGNOSIS — A419 Sepsis, unspecified organism: Secondary | ICD-10-CM

## 2012-09-30 MED ORDER — CEFDINIR 300 MG PO CAPS: 300.0000 mg | ORAL_CAPSULE | Freq: Two times a day (BID) | ORAL | Status: DC

## 2012-09-30 NOTE — Telephone Encounter (Signed)
Called patient about her labs WBC elevated and urine grew resistant e coli.  Plan D/C the levaquin and change to omnicef 300 mg twice a day for 10 days . Follow up after antibiotic completed.Marland Kitchen

## 2012-10-01 ENCOUNTER — Other Ambulatory Visit: Payer: Self-pay | Admitting: Family Medicine

## 2012-10-01 ENCOUNTER — Telehealth: Payer: Self-pay | Admitting: Family Medicine

## 2012-10-01 LAB — OVA AND PARASITE SCREEN: OP: NONE SEEN

## 2012-10-01 NOTE — Telephone Encounter (Signed)
Spoke with pt and she is concerned about cardiac procedure Would like to speak with dr Modesto Charon

## 2012-10-02 ENCOUNTER — Telehealth: Payer: Self-pay | Admitting: Family Medicine

## 2012-10-02 LAB — STOOL CULTURE

## 2012-10-02 LAB — CLOSTRIDIUM DIFFICILE EIA: CDIFTX: POSITIVE

## 2012-10-02 NOTE — Telephone Encounter (Signed)
Spoke with patient : 1)UTI with resistant E coli presently treated with omnicef based on cultures. She is doing well with this. 2) has C. Diff + stools as part of her work up last week. However her diarrhea has totally stopped. The  Result was called and came in today. Patient informed of test result. But since the diarrhea has resolved will observe for now. 3) Scheduled for Lexiscan myoview test in 2 days. Called Dr Hochrein's assistance for FYI to give input on patient's  Status in case there is a contraindication for the cardiac procedure. 4) GYN in [place] has been calling patient but the patient has no answering machine. 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 See note of last Fam Med visit. 5) follow up with Dr Modesto Charon next week to recheck UA , Ucx and CBC. And to follow through on the work up and procedures completed.  Sumayya Muha P. Modesto Charon, M.D.

## 2012-10-02 NOTE — Telephone Encounter (Signed)
Discussed with patient today . See telephone documentation. Alexa Key P. Modesto Charon, M.D.

## 2012-10-02 NOTE — Telephone Encounter (Signed)
Please advise 

## 2012-10-04 ENCOUNTER — Ambulatory Visit (HOSPITAL_COMMUNITY): Payer: Medicare Other | Attending: Cardiology | Admitting: Radiology

## 2012-10-04 VITALS — BP 179/77 | HR 56 | Ht 61.0 in | Wt 134.0 lb

## 2012-10-04 DIAGNOSIS — F17200 Nicotine dependence, unspecified, uncomplicated: Secondary | ICD-10-CM | POA: Insufficient documentation

## 2012-10-04 DIAGNOSIS — I219 Acute myocardial infarction, unspecified: Secondary | ICD-10-CM

## 2012-10-04 DIAGNOSIS — I779 Disorder of arteries and arterioles, unspecified: Secondary | ICD-10-CM | POA: Insufficient documentation

## 2012-10-04 DIAGNOSIS — E119 Type 2 diabetes mellitus without complications: Secondary | ICD-10-CM | POA: Insufficient documentation

## 2012-10-04 DIAGNOSIS — R0609 Other forms of dyspnea: Secondary | ICD-10-CM | POA: Insufficient documentation

## 2012-10-04 DIAGNOSIS — R0989 Other specified symptoms and signs involving the circulatory and respiratory systems: Secondary | ICD-10-CM | POA: Insufficient documentation

## 2012-10-04 DIAGNOSIS — I214 Non-ST elevation (NSTEMI) myocardial infarction: Secondary | ICD-10-CM

## 2012-10-04 DIAGNOSIS — Z9861 Coronary angioplasty status: Secondary | ICD-10-CM | POA: Insufficient documentation

## 2012-10-04 DIAGNOSIS — Z8673 Personal history of transient ischemic attack (TIA), and cerebral infarction without residual deficits: Secondary | ICD-10-CM | POA: Insufficient documentation

## 2012-10-04 DIAGNOSIS — I739 Peripheral vascular disease, unspecified: Secondary | ICD-10-CM | POA: Insufficient documentation

## 2012-10-04 DIAGNOSIS — E785 Hyperlipidemia, unspecified: Secondary | ICD-10-CM | POA: Insufficient documentation

## 2012-10-04 DIAGNOSIS — R0602 Shortness of breath: Secondary | ICD-10-CM

## 2012-10-04 DIAGNOSIS — I10 Essential (primary) hypertension: Secondary | ICD-10-CM | POA: Insufficient documentation

## 2012-10-04 DIAGNOSIS — I252 Old myocardial infarction: Secondary | ICD-10-CM | POA: Insufficient documentation

## 2012-10-04 MED ORDER — TECHNETIUM TC 99M SESTAMIBI GENERIC - CARDIOLITE
10.0000 | Freq: Once | INTRAVENOUS | Status: AC | PRN
  Administered 2012-10-04: 10 via INTRAVENOUS

## 2012-10-04 MED ORDER — TECHNETIUM TC 99M SESTAMIBI GENERIC - CARDIOLITE
30.0000 | Freq: Once | INTRAVENOUS | Status: AC | PRN
  Administered 2012-10-04: 30 via INTRAVENOUS

## 2012-10-04 MED ORDER — REGADENOSON 0.4 MG/5ML IV SOLN
0.4000 mg | Freq: Once | INTRAVENOUS | Status: AC
  Administered 2012-10-04: 0.4 mg via INTRAVENOUS

## 2012-10-04 NOTE — Progress Notes (Addendum)
[facility] SITE 3 NUCLEAR MED [address] [place], [place] [address] [phone]    Cardiology Nuclear Med Study  Alexa Key is a 62 y.o. female     MRN : [id number]     DOB: [birth year]  Procedure Date: 10/04/2012  Nuclear Med Background Indication for Stress Test:  Evaluation for Ischemia, PTCA Patency and Pending Surgical Clearance for Urology Procedure  History:  h/o PTCA (no records); s/p (L) CEA; 4/14 Hospital:UTI with secondary NSTEMI; 4/14 Echo:EF=55% Cardiac Risk Factors: Carotid Disease, CVA, Hypertension, Lipids, NIDDM, PVD, Smoker and TIA  Symptoms:  DOE   Nuclear Pre-Procedure Caffeine/Decaff Intake:  None> 12 hrs NPO After: 12:00am   Lungs:  (R) Lung diminished, no wheezes. O2 Sat: 96% on room air. IV 0.9% NS with Angio Cath:  22g  IV Site: L Antecubital x 1, tolerated well IV Started by:  Irean Hong, RN  Chest Size (in):  40 Cup Size: B  Height: 5\' 1"  (1.549 m)  Weight:  134 lb (60.782 kg)  BMI:  Body mass index is 25.33 kg/(m^2). Tech Comments:  Metoprolol taken upon arrival    Nuclear Med Study 1 or 2 day study: 1 day  Stress Test Type:  Eugenie Birks  Reading MD: Dietrich Pates, MD  Order Authorizing Provider:  Rollene Rotunda, MD  Resting Radionuclide: Technetium 30m Sestamibi  Resting Radionuclide Dose: 11.0 mCi   Stress Radionuclide:  Technetium 110m Sestamibi  Stress Radionuclide Dose: 33.0 mCi           Stress Protocol Rest HR: 56 Stress HR: 66  Rest BP: 179/77 Stress BP: 183/73  Exercise Time (min): n/a METS: n/a   Predicted Max HR: 156 bpm % Max HR: 42.31 bpm Rate Pressure Product: 02725   Dose of Adenosine (mg):  n/a Dose of Lexiscan: 0.4 mg  Dose of Atropine (mg): n/a Dose of Dobutamine: n/a mcg/kg/min (at max HR)  Stress Test Technologist: Smiley Houseman, CMA-N  Nuclear Technologist:  Domenic Polite, CNMT     Rest Procedure:  Myocardial perfusion imaging was performed at rest 45 minutes following the intravenous administration  of Technetium 61m Sestamibi.  Rest ECG: NSR with non-specific ST-T wave changes  Stress Procedure:  The patient received IV Lexiscan 0.4 mg over 15-seconds.  Technetium 79m Sestamibi injected at 30-seconds.  Quantitative spect images were obtained after a 45 minute delay.  Stress ECG: No significant change from baseline ECG  QPS Raw Data Images:  SOft tissue (diaphragm, breast, bowel activity) surround heart. Stress Images:   Mild thinning in atherolateral wall (mid, distal)  Best observed  in short axis views  Otherwise normal perfusion.  Rest Images:  Normalization of counts in short axis images. Subtraction (SDS): Mild ischemia. Transient Ischemic Dilatation (Normal <1.22):  1.07 Lung/Heart Ratio (Normal <0.45):  0.26  Quantitative Gated Spect Images QGS EDV:  89 ml QGS ESV:  34 ml  Impression Exercise Capacity:  Lexiscan with no exercise. BP Response:  Normal blood pressure response. Clinical Symptoms:  No chest pain. ECG Impression:  No significant ST segment change suggestive of ischemia. Comparison with Prior Nuclear Study: No previous nuclear study performed  Overall Impression: Anterolateral thinning that improves in recovery.  Otherwise normal perfusion.  May represent small area of mild anterolateral ischemia.  After review of raw images, cannot completely exclude shifting soft tissue (breast).  LV Ejection Fraction: 62%.  LV Wall Motion:  NL LV Function; NL Wall Motion  Dietrich Pates

## 2012-10-11 ENCOUNTER — Ambulatory Visit (INDEPENDENT_AMBULATORY_CARE_PROVIDER_SITE_OTHER): Payer: Medicare Other | Admitting: Family Medicine

## 2012-10-11 ENCOUNTER — Encounter: Payer: Self-pay | Admitting: Family Medicine

## 2012-10-11 VITALS — BP 162/71 | HR 65 | Temp 98.2°F | Wt 137.0 lb

## 2012-10-11 DIAGNOSIS — Z720 Tobacco use: Secondary | ICD-10-CM

## 2012-10-11 DIAGNOSIS — N23 Unspecified renal colic: Secondary | ICD-10-CM

## 2012-10-11 DIAGNOSIS — N200 Calculus of kidney: Secondary | ICD-10-CM

## 2012-10-11 DIAGNOSIS — N760 Acute vaginitis: Secondary | ICD-10-CM

## 2012-10-11 DIAGNOSIS — N179 Acute kidney failure, unspecified: Secondary | ICD-10-CM

## 2012-10-11 DIAGNOSIS — J9600 Acute respiratory failure, unspecified whether with hypoxia or hypercapnia: Secondary | ICD-10-CM

## 2012-10-11 DIAGNOSIS — R309 Painful micturition, unspecified: Secondary | ICD-10-CM

## 2012-10-11 DIAGNOSIS — I214 Non-ST elevation (NSTEMI) myocardial infarction: Secondary | ICD-10-CM

## 2012-10-11 DIAGNOSIS — A419 Sepsis, unspecified organism: Secondary | ICD-10-CM

## 2012-10-11 DIAGNOSIS — F17200 Nicotine dependence, unspecified, uncomplicated: Secondary | ICD-10-CM

## 2012-10-11 DIAGNOSIS — N390 Urinary tract infection, site not specified: Secondary | ICD-10-CM

## 2012-10-11 LAB — POCT URINALYSIS DIPSTICK
Bilirubin, UA: NEGATIVE
Glucose, UA: NEGATIVE
Ketones, UA: NEGATIVE
Nitrite, UA: NEGATIVE
Spec Grav, UA: 1.02
Urobilinogen, UA: NEGATIVE
pH, UA: 6

## 2012-10-11 LAB — POCT CBC
Granulocyte percent: 71.8 %G (ref 37–80)
HCT, POC: 31.9 % — AB (ref 37.7–47.9)
Hemoglobin: 10.8 g/dL — AB (ref 12.2–16.2)
Lymph, poc: 2.8 (ref 0.6–3.4)
MCH, POC: 31.6 pg — AB (ref 27–31.2)
MCHC: 33.9 g/dL (ref 31.8–35.4)
MCV: 93.3 fL (ref 80–97)
MPV: 6.7 fL (ref 0–99.8)
POC Granulocyte: 9 — AB (ref 2–6.9)
POC LYMPH PERCENT: 21.9 %L (ref 10–50)
Platelet Count, POC: 341 10*3/uL (ref 142–424)
RBC: 3.4 M/uL — AB (ref 4.04–5.48)
RDW, POC: 17 %
WBC: 12.6 10*3/uL — AB (ref 4.6–10.2)

## 2012-10-11 LAB — POCT UA - MICROSCOPIC ONLY
Casts, Ur, LPF, POC: NEGATIVE
Crystals, Ur, HPF, POC: NEGATIVE

## 2012-10-11 MED ORDER — NITROFURANTOIN MONOHYD MACRO 100 MG PO CAPS: 100.0000 mg | ORAL_CAPSULE | Freq: Four times a day (QID) | ORAL | Status: DC

## 2012-10-11 NOTE — Progress Notes (Signed)
Patient ID: Alexa Key, female   DOB: 1951/01/21, 62 y.o.   MRN: [id number] SUBJECTIVE: CC: Chief Complaint  Patient presents with  . Follow-up    reck urine    HPI: Doing better. Was taking the antibiotic once daily instead of twice a day. Urine turned milky white again. Had cardiac evaluation and told it was okay. No fevers , no back pain. Has not had the referral letter at home.  PMH/PSH: reviewed/updated in Epic  SH/FH: reviewed/updated in Epic  Allergies: reviewed/updated in Epic  Medications: reviewed/updated in Epic  Immunizations: reviewed/updated in Epic  ROS: As above in the HPI. All other systems are stable or negative.  OBJECTIVE: APPEARANCE:  Patient in no acute distress.The patient appeared well nourished and normally developed. Acyanotic. Waist: VITAL SIGNS:BP 162/71  Pulse 65  Temp(Src) 98.2 F (36.8 C) (Oral)  Wt 137 lb (62.143 kg)  BMI 25.9 kg/m2 WF  SKIN: warm and  Dry without overt rashes, tattoos and scars  HEAD and Neck: without JVD, Head and scalp: normal Eyes:No scleral icterus. Fundi normal, eye movements normal. Ears: Auricle normal, canal normal, Tympanic membranes normal, insufflation normal. Nose: normal Throat: normal Neck & thyroid: normal  CHEST & LUNGS: Chest wall: normal Lungs: Clear  CVS: Reveals the PMI to be normally located. Regular rhythm, First and Second Heart sounds are normal,  absence of murmurs, rubs or gallops. Peripheral vasculature: Radial pulses: normal Dorsal pedis pulses: normal Posterior pulses: normal  ABDOMEN:  Appearance: normal Benign, no organomegaly, no masses, no Abdominal Aortic enlargement. No Guarding , no rebound. No Bruits. Bowel sounds: normal  RECTAL: N/A GU: N/A  NEUROLOGIC: oriented to time,place and person; nonfocal. Results for orders placed in visit on 10/11/12  POCT URINALYSIS DIPSTICK      Result Value Range   Color, UA turbid     Clarity, UA cloudy     Glucose, UA  neg     Bilirubin, UA neg     Ketones, UA neg     Spec Grav, UA 1.020     Blood, UA large     pH, UA 6.0     Protein, UA large     Urobilinogen, UA negative     Nitrite, UA neg     Leukocytes, UA large (3+)    POCT UA - MICROSCOPIC ONLY      Result Value Range   WBC, Ur, HPF, POC 100-150     RBC, urine, microscopic 50-75     Bacteria, U Microscopic mod     Mucus, UA mod     Epithelial cells, urine per micros occ     Crystals, Ur, HPF, POC neg     Casts, Ur, LPF, POC neg    POCT CBC      Result Value Range   WBC 12.6 (*) 4.6 - 10.2 K/uL   Lymph, poc 2.8  0.6 - 3.4   POC LYMPH PERCENT 21.9  10 - 50 %L   POC Granulocyte 9.0 (*) 2 - 6.9   Granulocyte percent 71.8  37 - 80 %G   RBC 3.4 (*) 4.04 - 5.48 M/uL   Hemoglobin 10.8 (*) 12.2 - 16.2 g/dL   HCT, POC 40.9 (*) 81.1 - 47.9 %   MCV 93.3  80 - 97 fL   MCH, POC 31.6 (*) 27 - 31.2 pg   MCHC 33.9  31.8 - 35.4 g/dL   RDW, POC 91.4     Platelet Count, POC 341.0  142 -  424 K/uL   MPV 6.7  0 - 99.8 fL     ASSESSMENT: Pain with urination - Plan: POCT urinalysis dipstick, POCT UA - Microscopic Only, Urine culture  UTI (urinary tract infection) - Plan: nitrofurantoin, macrocrystal-monohydrate, (MACROBID) 100 MG capsule, Urine culture, POCT CBC  Tobacco user  Kidney stone  NSTEMI (non-ST elevated myocardial infarction)  Acute renal failure  Acute respiratory failure  Urosepsis    PLAN:  Meds ordered this encounter  Medications  . nitrofurantoin, macrocrystal-monohydrate, (MACROBID) 100 MG capsule    Sig: Take 1 capsule (100 mg total) by mouth 4 (four) times daily.    Dispense:  40 capsule    Refill:  1   Return in about 3 weeks (around 11/01/2012) for Recheck medical problems.  Merrie Epler P. Modesto Charon, M.D.

## 2012-10-13 LAB — URINE CULTURE: Colony Count: 75000

## 2012-10-17 NOTE — Progress Notes (Signed)
Quick Note:  Labs abnormal. Urine culture grew yeast. Use OTC Gynelotrimin in the vagina to help with this infection and include a yoghurt daily and probiotics daily.  ______

## 2012-10-21 ENCOUNTER — Other Ambulatory Visit: Payer: Self-pay | Admitting: Family Medicine

## 2012-10-24 ENCOUNTER — Other Ambulatory Visit: Payer: Self-pay | Admitting: *Deleted

## 2012-10-24 MED ORDER — METOPROLOL TARTRATE 25 MG PO TABS: 25.0000 mg | ORAL_TABLET | Freq: Two times a day (BID) | ORAL | Status: DC

## 2012-10-29 ENCOUNTER — Telehealth: Payer: Self-pay | Admitting: Family Medicine

## 2012-10-29 NOTE — Telephone Encounter (Signed)
Per daughter saw gyn Dr Delena Bali and they did labs and want to see her back this week due to the kidney function test. She will be seeing Dr Delena Bali tomorrow October 30 2012.  Has appt with DrWong .

## 2012-10-29 NOTE — Telephone Encounter (Signed)
Keep appointment with Dr. Modesto Charon

## 2012-10-30 NOTE — Telephone Encounter (Signed)
Grand daughter will relay message to have pt keep appt with Dr. Modesto Charon.

## 2012-11-05 ENCOUNTER — Telehealth: Payer: Self-pay | Admitting: Family Medicine

## 2012-11-05 ENCOUNTER — Encounter: Payer: Self-pay | Admitting: Family Medicine

## 2012-11-05 ENCOUNTER — Ambulatory Visit (INDEPENDENT_AMBULATORY_CARE_PROVIDER_SITE_OTHER): Payer: Medicare Other | Admitting: Family Medicine

## 2012-11-05 VITALS — BP 145/72 | HR 57 | Temp 97.5°F | Wt 133.8 lb

## 2012-11-05 DIAGNOSIS — I214 Non-ST elevation (NSTEMI) myocardial infarction: Secondary | ICD-10-CM

## 2012-11-05 DIAGNOSIS — Z720 Tobacco use: Secondary | ICD-10-CM

## 2012-11-05 DIAGNOSIS — N390 Urinary tract infection, site not specified: Secondary | ICD-10-CM

## 2012-11-05 DIAGNOSIS — B3731 Acute candidiasis of vulva and vagina: Secondary | ICD-10-CM

## 2012-11-05 DIAGNOSIS — N179 Acute kidney failure, unspecified: Secondary | ICD-10-CM

## 2012-11-05 DIAGNOSIS — N200 Calculus of kidney: Secondary | ICD-10-CM

## 2012-11-05 DIAGNOSIS — B373 Candidiasis of vulva and vagina: Secondary | ICD-10-CM

## 2012-11-05 DIAGNOSIS — A419 Sepsis, unspecified organism: Secondary | ICD-10-CM

## 2012-11-05 DIAGNOSIS — N10 Acute tubulo-interstitial nephritis: Secondary | ICD-10-CM

## 2012-11-05 DIAGNOSIS — F17200 Nicotine dependence, unspecified, uncomplicated: Secondary | ICD-10-CM

## 2012-11-05 LAB — POCT URINALYSIS DIPSTICK
Bilirubin, UA: NEGATIVE
Glucose, UA: NEGATIVE
Ketones, UA: NEGATIVE
Nitrite, UA: NEGATIVE
Protein, UA: 300
Spec Grav, UA: 1.025
Urobilinogen, UA: NEGATIVE
pH, UA: 6.5

## 2012-11-05 LAB — POCT CBC
Granulocyte percent: 75.3 %G (ref 37–80)
HCT, POC: 39.5 % (ref 37.7–47.9)
Hemoglobin: 12.9 g/dL (ref 12.2–16.2)
Lymph, poc: 2.5 (ref 0.6–3.4)
MCH, POC: 31.6 pg — AB (ref 27–31.2)
MCHC: 32.8 g/dL (ref 31.8–35.4)
MCV: 96.4 fL (ref 80–97)
MPV: 7.9 fL (ref 0–99.8)
POC Granulocyte: 9 — AB (ref 2–6.9)
POC LYMPH PERCENT: 21.1 %L (ref 10–50)
Platelet Count, POC: 300 10*3/uL (ref 142–424)
RBC: 4.1 M/uL (ref 4.04–5.48)
RDW, POC: 18.8 %
WBC: 12 10*3/uL — AB (ref 4.6–10.2)

## 2012-11-05 MED ORDER — FLUCONAZOLE 100 MG PO TABS: 100.0000 mg | ORAL_TABLET | Freq: Every day | ORAL | Status: DC

## 2012-11-05 MED ORDER — CIPROFLOXACIN HCL 250 MG PO TABS: 250.0000 mg | ORAL_TABLET | Freq: Every day | ORAL | Status: DC

## 2012-11-05 NOTE — Telephone Encounter (Signed)
FYI pt had ultrasound and may be few minutes late for appt

## 2012-11-05 NOTE — Progress Notes (Signed)
Patient ID: Alexa Key, female   DOB: 25-Dec-1950, 62 y.o.   MRN: [id number] SUBJECTIVE: CC: Chief Complaint  Patient presents with  . Follow-up    3 wk follow up states had ultrasound for kidneys sees Dr Eliott Nine  Kidney function test was up . 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 has stent in right kidney     HPI: Came in for follow up. 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 She has had a Ultrasound of the kidneys today and  Awaiting that result. This was ordered by Dr Eliott Nine.  Patient would like  A referral to [facility] instead for further treatment. Does not feel comfortable going back to [facility] because she had a acute myocardial infarction during her stenting procedure and was septic from the obstructive uropathy, even though all the steps taken were correct and her illness and comorbid conditions and smoking habits were the causes for her events. She also continues to smoke.  Past Medical History  Diagnosis Date  . CAD (coronary artery disease)   . PVD (peripheral vascular disease)   . CVA (cerebral infarction)     TIAx2  . HTN (hypertension)   . HLD (hyperlipidemia)   . Tobacco abuse   . Diverticulitis 08/04/12  . S/P partial lobectomy of lung 1997    per [facility], incidental finding, path c/w benign lesion  . Stroke   . COPD (chronic obstructive pulmonary disease)   . Shortness of breath   . Fibromyalgia    Past Surgical History  Procedure Laterality Date  . Hysterectomoy    . Lumbar spine surgery    . Carpal tunnel release     History   Social History  . Marital Status: Divorced    Spouse Name: N/A    Number of Children: N/A  . Years of Education: N/A   Occupational History  . Not on file.   Social History Main Topics  . Smoking status: Current Every Day Smoker -- 1.00 packs/day for 49 years     Types: Cigarettes  . Smokeless tobacco: Not on file  . Alcohol Use: Not on file  . Drug Use: Not on file  . Sexually Active: Not Currently   Other Topics Concern  . Not on file   Social History Narrative  . No narrative on file   No family history on file. Current Outpatient Prescriptions on File Prior to Visit  Medication Sig Dispense Refill  . amLODipine (NORVASC) 2.5 MG tablet Take 2.5 mg by mouth daily.      Marland Kitchen aspirin 325 MG tablet Take 325 mg by mouth daily.      . cefdinir (OMNICEF) 300 MG capsule Take 1 capsule (300 mg total) by mouth 2 (two) times daily.  20 capsule  0  . cholecalciferol (VITAMIN D) 1000 UNITS tablet Take 2,000 Units by mouth daily.      . fenofibrate 160 MG tablet TAKE ONE TABLET BY MOUTH ONE TIME DAILY  30 tablet  4  . losartan (COZAAR) 100 MG tablet Take 100 mg by mouth daily.      . metoprolol tartrate (LOPRESSOR) 25 MG tablet Take 1 tablet (25 mg total) by mouth 2 (two) times daily.  30 tablet  0  . nitrofurantoin, macrocrystal-monohydrate, (MACROBID) 100 MG capsule Take 1 capsule (100 mg total) by mouth 4 (four)  times daily.  40 capsule  1  . omeprazole (PRILOSEC) 20 MG capsule TAKE  ONE CAPSULE BY MOUTH EVERY MORNING  30 capsule  1  . ondansetron (ZOFRAN) 4 MG tablet Take 1 tablet (4 mg total) by mouth every 8 (eight) hours as needed for nausea.  20 tablet  0  . pravastatin (PRAVACHOL) 40 MG tablet Take 40 mg by mouth every evening.       No current facility-administered medications on file prior to visit.   Allergies  Allergen Reactions  . Penicillins Rash  . Sulfa Antibiotics Rash    There is no immunization history on file for this patient. Prior to Admission medications   Medication Sig Start Date End Date Taking? Authorizing Provider  amLODipine (NORVASC) 2.5 MG tablet Take 2.5 mg by mouth daily.    Historical Provider, MD  aspirin 325 MG tablet Take 325 mg by mouth daily.    Historical Provider, MD  cefdinir (OMNICEF) 300 MG capsule  Take 1 capsule (300 mg total) by mouth 2 (two) times daily. 09/30/12   Ileana Ladd, MD  cholecalciferol (VITAMIN D) 1000 UNITS tablet Take 2,000 Units by mouth daily.    Historical Provider, MD  ciprofloxacin (CIPRO) 250 MG tablet Take 1 tablet (250 mg total) by mouth daily. 11/05/12   Ileana Ladd, MD  fenofibrate 160 MG tablet TAKE ONE TABLET BY MOUTH ONE TIME DAILY 10/21/12   Ileana Ladd, MD  fluconazole (DIFLUCAN) 100 MG tablet Take 1 tablet (100 mg total) by mouth daily. once 11/05/12   Ileana Ladd, MD  losartan (COZAAR) 100 MG tablet Take 100 mg by mouth daily.    Historical Provider, MD  metoprolol tartrate (LOPRESSOR) 25 MG tablet Take 1 tablet (25 mg total) by mouth 2 (two) times daily. 10/24/12   Ileana Ladd, MD  nitrofurantoin, macrocrystal-monohydrate, (MACROBID) 100 MG capsule Take 1 capsule (100 mg total) by mouth 4 (four) times daily. 10/11/12   Ileana Ladd, MD  omeprazole (PRILOSEC) 20 MG capsule TAKE  ONE CAPSULE BY MOUTH EVERY MORNING 08/15/12   Ernestina Penna, MD  ondansetron (ZOFRAN) 4 MG tablet Take 1 tablet (4 mg total) by mouth every 8 (eight) hours as needed for nausea. 09/25/12   Ileana Ladd, MD  pravastatin (PRAVACHOL) 40 MG tablet Take 40 mg by mouth every evening.    Historical Provider, MD     ROS: As above in the HPI. All other systems are stable or negative.  OBJECTIVE: APPEARANCE:  Patient in no acute distress.The patient appeared well nourished and normally developed. Acyanotic. Waist: VITAL SIGNS:BP 145/72  Pulse 57  Temp(Src) 97.5 F (36.4 C) (Oral)  Wt 133 lb 12.8 oz (60.691 kg)  BMI 25.29 kg/m2  WF, non toxic. Mild kyphosis  SKIN: warm and  Dry without overt rashes, tattoos and scars  HEAD and Neck: without JVD, Head and scalp: normal Eyes:No scleral icterus. Fundi normal, eye movements normal. Ears: Auricle normal, canal normal, Tympanic membranes normal, insufflation normal. Nose: normal Throat: normal Neck & thyroid:  normal  CHEST & LUNGS: Chest wall: normal Lungs: Clear  CVS: Reveals the PMI to be normally located. Regular rhythm, First and Second Heart sounds are normal,  absence of murmurs, rubs or gallops. Peripheral vasculature: Radial pulses: normal Dorsal pedis pulses: normal Posterior pulses: normal  ABDOMEN:  Appearance: normal Benign, no organomegaly, no masses, no Abdominal Aortic enlargement. No Guarding , no rebound. No Bruits. Bowel sounds: normal  RECTAL: N/A GU:  N/A  EXTREMETIES: nonedematous. Both Femoral and Pedal pulses are normal.  MUSCULOSKELETAL:  Spine: normal Joints: intact  NEUROLOGIC: oriented to time,place and person; nonfocal. Strength is normal Sensory is normal Reflexes are normal Cranial Nerves are normal.  Results for orders placed in visit on 11/05/12  POCT CBC      Result Value Range   WBC 12.0 (*) 4.6 - 10.2 K/uL   Lymph, poc 2.5  0.6 - 3.4   POC LYMPH PERCENT 21.1  10 - 50 %L   POC Granulocyte 9.0 (*) 2 - 6.9   Granulocyte percent 75.3  37 - 80 %G   RBC 4.1  4.04 - 5.48 M/uL   Hemoglobin 12.9  12.2 - 16.2 g/dL   HCT, POC 16.1  09.6 - 47.9 %   MCV 96.4  80 - 97 fL   MCH, POC 31.6 (*) 27 - 31.2 pg   MCHC 32.8  31.8 - 35.4 g/dL   RDW, POC 04.5     Platelet Count, POC 300.0  142 - 424 K/uL   MPV 7.9  0 - 99.8 fL  POCT URINALYSIS DIPSTICK      Result Value Range   Color, UA amber     Clarity, UA cloudy     Glucose, UA neg     Bilirubin, UA neg     Ketones, UA neg     Spec Grav, UA 1.025     Blood, UA large     pH, UA 6.5     Protein, UA 300     Urobilinogen, UA negative     Nitrite, UA neg     Leukocytes, UA large (3+)      ASSESSMENT: Urosepsis - Plan: POCT CBC, POCT urinalysis dipstick, Urine culture, Ambulatory referral to Urology, ciprofloxacin (CIPRO) 250 MG tablet  Kidney stone - Plan: Ambulatory referral to Urology  Acute renal failure - Plan: COMPLETE METABOLIC PANEL WITH GFR, Ambulatory referral to Urology  NSTEMI  (non-ST elevated myocardial infarction)  Pyelonephritis, acute - Plan: Ambulatory referral to Urology  Tobacco user  Yeast vaginitis - Plan: fluconazole (DIFLUCAN) 100 MG tablet diflucan Rx because patient  Tend to have  Yeast vaginitis with cipro. Her urine was very thick with greenish color. Will adjust the dose of the cipro for the known GFR.  PLAN: Orders Placed This Encounter  Procedures  . Urine culture  . COMPLETE METABOLIC PANEL WITH GFR  . Ambulatory referral to Urology    Referral Priority:  Routine    Referral Type:  Consultation    Referral Reason:  Specialty Services Required    Requested Specialty:  Urology    Number of Visits Requested:  1  . POCT CBC  . POCT urinalysis dipstick   Meds ordered this encounter  Medications  . DISCONTD: nitrofurantoin (MACRODANTIN) 100 MG capsule    Sig:   . fluconazole (DIFLUCAN) 100 MG tablet    Sig: Take 1 tablet (100 mg total) by mouth daily. once    Dispense:  1 tablet    Refill:  0  . ciprofloxacin (CIPRO) 250 MG tablet    Sig: Take 1 tablet (250 mg total) by mouth daily.    Dispense:  5 tablet    Refill:  0   Await the culture in the next 3 days.  Counseled on smoking cessation in the face of her recent AMI. Spent 10 minutes.  Await the Renal U/S report. Discussed with the patient the reasons for delays in procedures by the  specialists is because of the recent MI and a surgical procedure is high risk at this point. In the meantime will try and control the UTIs and await the input by the Urologist and renal.  Return in about 10 days (around 11/15/2012) for Recheck medical problems.  Kolbey Teichert P. Modesto Charon, M.D.

## 2012-11-06 LAB — COMPLETE METABOLIC PANEL WITH GFR
ALT: <8 U/L (ref 0–35)
AST: 12 U/L (ref 0–37)
Albumin: 3.5 g/dL (ref 3.5–5.2)
Alkaline Phosphatase: 60 U/L (ref 39–117)
BUN: 22 mg/dL (ref 6–23)
CO2: 21 mEq/L (ref 19–32)
Calcium: 9.2 mg/dL (ref 8.4–10.5)
Chloride: 114 mEq/L — ABNORMAL HIGH (ref 96–112)
Creat: 1.69 mg/dL — ABNORMAL HIGH (ref 0.50–1.10)
GFR, Est African American: 36 mL/min — ABNORMAL LOW
GFR, Est Non African American: 32 mL/min — ABNORMAL LOW
Glucose, Bld: 122 mg/dL — ABNORMAL HIGH (ref 70–99)
Potassium: 4.8 mEq/L (ref 3.5–5.3)
Sodium: 142 mEq/L (ref 135–145)
Total Bilirubin: 0.4 mg/dL (ref 0.3–1.2)
Total Protein: 6.9 g/dL (ref 6.0–8.3)

## 2012-11-10 LAB — URINE CULTURE: Colony Count: >=100000

## 2012-11-11 ENCOUNTER — Other Ambulatory Visit: Payer: Self-pay | Admitting: Family Medicine

## 2012-11-11 DIAGNOSIS — N390 Urinary tract infection, site not specified: Secondary | ICD-10-CM

## 2012-11-11 MED ORDER — CEFDINIR 300 MG PO CAPS: 300.0000 mg | ORAL_CAPSULE | Freq: Two times a day (BID) | ORAL | Status: DC

## 2012-11-11 NOTE — Progress Notes (Signed)
Quick Note:  Labs abnormal. Urine Culture grew strept bovis. Will need to change antibiotics. However, she may have completed the cipro. Will need to do another course of omnicef. Ordered in EPIC. ______

## 2012-11-15 ENCOUNTER — Ambulatory Visit (INDEPENDENT_AMBULATORY_CARE_PROVIDER_SITE_OTHER): Payer: Medicare Other | Admitting: Family Medicine

## 2012-11-15 ENCOUNTER — Encounter: Payer: Self-pay | Admitting: Family Medicine

## 2012-11-15 VITALS — BP 170/75 | HR 49 | Temp 97.6°F | Wt 131.8 lb

## 2012-11-15 DIAGNOSIS — N824 Other female intestinal-genital tract fistulae: Secondary | ICD-10-CM

## 2012-11-15 DIAGNOSIS — N823 Fistula of vagina to large intestine: Secondary | ICD-10-CM

## 2012-11-15 DIAGNOSIS — D72829 Elevated white blood cell count, unspecified: Secondary | ICD-10-CM

## 2012-11-15 DIAGNOSIS — N390 Urinary tract infection, site not specified: Secondary | ICD-10-CM

## 2012-11-15 DIAGNOSIS — F17200 Nicotine dependence, unspecified, uncomplicated: Secondary | ICD-10-CM

## 2012-11-15 DIAGNOSIS — Z720 Tobacco use: Secondary | ICD-10-CM

## 2012-11-15 DIAGNOSIS — A419 Sepsis, unspecified organism: Secondary | ICD-10-CM

## 2012-11-15 DIAGNOSIS — N179 Acute kidney failure, unspecified: Secondary | ICD-10-CM

## 2012-11-15 DIAGNOSIS — N10 Acute tubulo-interstitial nephritis: Secondary | ICD-10-CM

## 2012-11-15 DIAGNOSIS — I214 Non-ST elevation (NSTEMI) myocardial infarction: Secondary | ICD-10-CM

## 2012-11-15 DIAGNOSIS — N200 Calculus of kidney: Secondary | ICD-10-CM

## 2012-11-15 HISTORY — DX: Other female intestinal-genital tract fistulae: N82.4

## 2012-11-15 LAB — POCT CBC
Granulocyte percent: 62.4 %G (ref 37–80)
HCT, POC: 31.1 % — AB (ref 37.7–47.9)
Hemoglobin: 11.1 g/dL — AB (ref 12.2–16.2)
Lymph, poc: 2.9 (ref 0.6–3.4)
MCH, POC: 34.3 pg — AB (ref 27–31.2)
MCHC: 35.6 g/dL — AB (ref 31.8–35.4)
MCV: 96.4 fL (ref 80–97)
MPV: 7.6 fL (ref 0–99.8)
POC Granulocyte: 5.9 (ref 2–6.9)
POC LYMPH PERCENT: 30.5 %L (ref 10–50)
Platelet Count, POC: 259 10*3/uL (ref 142–424)
RBC: 3.2 M/uL — AB (ref 4.04–5.48)
RDW, POC: 19 %
WBC: 9.4 10*3/uL (ref 4.6–10.2)

## 2012-11-15 NOTE — Progress Notes (Signed)
Patient ID: Alexa Key, female   DOB: March 07, 1951, 62 y.o.   MRN: [id number] SUBJECTIVE: CC: Chief Complaint  Patient presents with  . Follow-up    1 week follow up     HPI:  Here for  Recheck of urosepsis. No fever. Saw Dr Delorse Lek.Has a second antibiotic(doxycycline) Rx for her to use.  He went on vacation and when he gets back he is contemplating surgery to remove the stones. Still smoking.  Past Medical History  Diagnosis Date  . CAD (coronary artery disease)   . PVD (peripheral vascular disease)   . CVA (cerebral infarction)     TIAx2  . HTN (hypertension)   . HLD (hyperlipidemia)   . Tobacco abuse   . Diverticulitis 08/04/12  . S/P partial lobectomy of lung 1997    per [facility], incidental finding, path c/w benign lesion  . Stroke   . COPD (chronic obstructive pulmonary disease)   . Shortness of breath   . Fibromyalgia    Past Surgical History  Procedure Laterality Date  . Hysterectomoy    . Lumbar spine surgery    . Carpal tunnel release     History   Social History  . Marital Status: Divorced    Spouse Name: N/A    Number of Children: N/A  . Years of Education: N/A   Occupational History  . Not on file.   Social History Main Topics  . Smoking status: Current Every Day Smoker -- 1.00 packs/day for 49 years    Types: Cigarettes  . Smokeless tobacco: Not on file  . Alcohol Use: Not on file  . Drug Use: Not on file  . Sexually Active: Not Currently   Other Topics Concern  . Not on file   Social History Narrative  . No narrative on file   No family history on file. Current Outpatient Prescriptions on File Prior to Visit  Medication Sig Dispense Refill  . amLODipine (NORVASC) 2.5 MG tablet Take 2.5 mg by mouth daily.      Marland Kitchen aspirin 325 MG tablet Take 325 mg by mouth daily.      . cefdinir (OMNICEF) 300 MG capsule Take 1 capsule (300 mg total) by mouth 2 (two) times daily.  20 capsule  0  . cholecalciferol (VITAMIN D) 1000 UNITS tablet Take 2,000 Units  by mouth daily.      . ciprofloxacin (CIPRO) 250 MG tablet Take 1 tablet (250 mg total) by mouth daily.  5 tablet  0  . fenofibrate 160 MG tablet TAKE ONE TABLET BY MOUTH ONE TIME DAILY  30 tablet  4  . fluconazole (DIFLUCAN) 100 MG tablet Take 1 tablet (100 mg total) by mouth daily. once  1 tablet  0  . losartan (COZAAR) 100 MG tablet Take 100 mg by mouth daily.      . metoprolol tartrate (LOPRESSOR) 25 MG tablet Take 1 tablet (25 mg total) by mouth 2 (two) times daily.  30 tablet  0  . nitrofurantoin, macrocrystal-monohydrate, (MACROBID) 100 MG capsule Take 1 capsule (100 mg total) by mouth 4 (four) times daily.  40 capsule  1  . omeprazole (PRILOSEC) 20 MG capsule TAKE  ONE CAPSULE BY MOUTH EVERY MORNING  30 capsule  1  . ondansetron (ZOFRAN) 4 MG tablet Take 1 tablet (4 mg total) by mouth every 8 (eight) hours as needed for nausea.  20 tablet  0  . pravastatin (PRAVACHOL) 40 MG tablet Take 40 mg by mouth every evening.  No current facility-administered medications on file prior to visit.   Allergies  Allergen Reactions  . Penicillins Rash  . Sulfa Antibiotics Rash    There is no immunization history on file for this patient. Prior to Admission medications   Medication Sig Start Date End Date Taking? Authorizing Provider  amLODipine (NORVASC) 2.5 MG tablet Take 2.5 mg by mouth daily.   Yes Historical Provider, MD  aspirin 325 MG tablet Take 325 mg by mouth daily.   Yes Historical Provider, MD  cefdinir (OMNICEF) 300 MG capsule Take 1 capsule (300 mg total) by mouth 2 (two) times daily. 11/11/12   Ileana Ladd, MD  cholecalciferol (VITAMIN D) 1000 UNITS tablet Take 2,000 Units by mouth daily.    Historical Provider, MD  ciprofloxacin (CIPRO) 250 MG tablet Take 1 tablet (250 mg total) by mouth daily. 11/05/12   Ileana Ladd, MD  diazepam (VALIUM) 10 MG tablet  11/12/12   Historical Provider, MD  doxycycline (VIBRA-TABS) 100 MG tablet Take 100 mg by mouth daily.  11/12/12   Historical  Provider, MD  fenofibrate 160 MG tablet TAKE ONE TABLET BY MOUTH ONE TIME DAILY 10/21/12   Ileana Ladd, MD  fluconazole (DIFLUCAN) 100 MG tablet Take 1 tablet (100 mg total) by mouth daily. once 11/05/12   Ileana Ladd, MD  losartan (COZAAR) 100 MG tablet Take 100 mg by mouth daily.    Historical Provider, MD  metoprolol tartrate (LOPRESSOR) 25 MG tablet Take 1 tablet (25 mg total) by mouth 2 (two) times daily. 10/24/12   Ileana Ladd, MD  nitrofurantoin, macrocrystal-monohydrate, (MACROBID) 100 MG capsule Take 1 capsule (100 mg total) by mouth 4 (four) times daily. 10/11/12   Ileana Ladd, MD  omeprazole (PRILOSEC) 20 MG capsule TAKE  ONE CAPSULE BY MOUTH EVERY MORNING 08/15/12   Ernestina Penna, MD  ondansetron (ZOFRAN) 4 MG tablet Take 1 tablet (4 mg total) by mouth every 8 (eight) hours as needed for nausea. 09/25/12   Ileana Ladd, MD  pravastatin (PRAVACHOL) 40 MG tablet Take 40 mg by mouth every evening.    Historical Provider, MD    ROS: As above in the HPI. All other systems are stable or negative.  OBJECTIVE: APPEARANCE:  Patient in no acute distress.The patient appeared well nourished and normally developed. Acyanotic. Waist: VITAL SIGNS:BP 170/75  Pulse 49  Temp(Src) 97.6 F (36.4 C) (Oral)  Wt 131 lb 12.8 oz (59.784 kg)  BMI 24.92 kg/m2 Recheck 130/70 WF SKIN: warm and  Dry without overt rashes, tattoos and scars  HEAD and Neck: without JVD, Head and scalp: normal Eyes:No scleral icterus. Fundi normal, eye movements normal. Ears: Auricle normal, canal normal, Tympanic membranes normal, insufflation normal. Nose: normal Throat: normal Neck & thyroid: normal  CHEST & LUNGS: Chest wall: normal Lungs: Clear  CVS: Reveals the PMI to be normally located. Regular rhythm, First and Second Heart sounds are normal,  absence of murmurs, rubs or gallops. Peripheral vasculature: Radial pulses: normal  ABDOMEN:  Appearance: normal Benign, no organomegaly, no  masses, no Abdominal Aortic enlargement. No Guarding , no rebound. No Bruits. Bowel sounds: normal  RECTAL: N/A GU: N/A  EXTREMETIES: nonedematous. Both Femoral and Pedal pulses are normal.  MUSCULOSKELETAL:  Spine: normal Joints: intact  NEUROLOGIC: oriented to time,place and person; nonfocal.  ASSESSMENT: Leucocytosis - Plan: POCT CBC  Urosepsis  Tobacco user  Pyelonephritis, acute  NSTEMI (non-ST elevated myocardial infarction)  Kidney stone  Rectovaginal fistula  Acute  renal failure  PLAN:  Orders Placed This Encounter  Procedures  . POCT CBC   Results for orders placed in visit on 11/15/12  POCT CBC      Result Value Range   WBC 9.4  4.6 - 10.2 K/uL   Lymph, poc 2.9  0.6 - 3.4   POC LYMPH PERCENT 30.5  10 - 50 %L   POC Granulocyte 5.9  2 - 6.9   Granulocyte percent 62.4  37 - 80 %G   RBC 3.2 (*) 4.04 - 5.48 M/uL   Hemoglobin 11.1 (*) 12.2 - 16.2 g/dL   HCT, POC 40.9 (*) 81.1 - 47.9 %   MCV 96.4  80 - 97 fL   MCH, POC 34.3 (*) 27 - 31.2 pg   MCHC 35.6 (*) 31.8 - 35.4 g/dL   RDW, POC 91.4     Platelet Count, POC 259.0  142 - 424 K/uL   MPV 7.6  0 - 99.8 fL   Meds ordered this encounter  Medications  . diazepam (VALIUM) 10 MG tablet    Sig:   . doxycycline (VIBRA-TABS) 100 MG tablet    Sig: Take 100 mg by mouth daily.    meds above Rx by Dr Retta Diones.  Smoking cessation counselling for 10 minutes especially in preparation for future surgery and improved help.  Return in about 4 weeks (around 12/13/2012).  Carmalita Wakefield P. Modesto Charon, M.D.

## 2012-11-16 ENCOUNTER — Other Ambulatory Visit: Payer: Self-pay | Admitting: Urology

## 2012-11-18 ENCOUNTER — Other Ambulatory Visit: Payer: Self-pay | Admitting: Family Medicine

## 2012-11-20 ENCOUNTER — Other Ambulatory Visit: Payer: Self-pay | Admitting: Urology

## 2012-11-20 DIAGNOSIS — N200 Calculus of kidney: Secondary | ICD-10-CM

## 2012-11-21 ENCOUNTER — Other Ambulatory Visit: Payer: Self-pay | Admitting: Radiology

## 2012-11-23 ENCOUNTER — Ambulatory Visit (HOSPITAL_COMMUNITY): Payer: Medicare Other

## 2012-11-26 ENCOUNTER — Telehealth: Payer: Self-pay | Admitting: Family Medicine

## 2012-11-26 ENCOUNTER — Encounter: Payer: Self-pay | Admitting: Family Medicine

## 2012-11-26 ENCOUNTER — Ambulatory Visit (INDEPENDENT_AMBULATORY_CARE_PROVIDER_SITE_OTHER): Payer: Medicare Other | Admitting: Family Medicine

## 2012-11-26 VITALS — BP 167/77 | HR 71 | Temp 99.3°F | Ht 61.0 in | Wt 131.0 lb

## 2012-11-26 DIAGNOSIS — J209 Acute bronchitis, unspecified: Secondary | ICD-10-CM

## 2012-11-26 MED ORDER — AZITHROMYCIN 250 MG PO TABS: ORAL_TABLET | ORAL | Status: DC

## 2012-11-26 NOTE — Progress Notes (Signed)
  Subjective:    Patient ID: Alexa Key, female    DOB: January 28, 1951, 62 y.o.   MRN: [id number]  HPI Patient comes in this afternoon with a history of cough and congestion for the past 3-4 days. She says her sputum is gray. She's had a slight fever. She denies headache. She still smokes about one pack every 2 days.   Review of Systems  Constitutional: Positive for fatigue. Negative for chills.  HENT: Positive for congestion and postnasal drip. Negative for ear pain.   Respiratory: Positive for cough.   Cardiovascular: Negative.   Gastrointestinal: Negative.   Genitourinary: Negative.   Musculoskeletal: Negative.  Negative for myalgias and arthralgias.  Neurological: Positive for dizziness (one episode yesterday). Negative for headaches.       Objective:   Physical Exam  Vitals reviewed. Constitutional: She is oriented to person, place, and time. No distress.  Somewhat frail looking for her stated age.  HENT:  Head: Normocephalic and atraumatic.  Right Ear: External ear normal.  Left Ear: External ear normal.  Nose: Nose normal.  Mouth/Throat: Oropharynx is clear and moist. No oropharyngeal exudate.  Eyes: Conjunctivae are normal. Right eye exhibits no discharge. Left eye exhibits discharge.  Neck: Normal range of motion. Neck supple. No thyromegaly present.  Cardiovascular: Normal rate, regular rhythm and normal heart sounds.  Exam reveals no gallop and no friction rub.   No murmur heard. At 72 per minute  Pulmonary/Chest: Effort normal. No respiratory distress. She has wheezes. She has no rales.  Decreased breath sounds bilaterally and some wheezes on deep inspiration  Musculoskeletal: Normal range of motion.  Lymphadenopathy:    She has no cervical adenopathy.  Neurological: She is alert and oriented to person, place, and time.  Skin: Skin is warm and dry. No rash noted. She is not diaphoretic. No erythema. No pallor.  Psychiatric: She has a normal mood and affect. Her  behavior is normal. Judgment and thought content normal.          Assessment & Plan:  1. Acute bronchitis - azithromycin (ZITHROMAX) 250 MG tablet; Take 2 tablets the first day then one daily for infection until complete  Dispense: 6 tablet; Refill: 0  Patient Instructions  Drink plenty of fluid Take medication as directed Take Mucinex one twice daily for cough and congestion If right ear lesion does not get better after this round of antibiotics, call us back and we will set up a visit with the dermatologist   Stop smoking!!!!  Nyra Capes MD

## 2012-11-26 NOTE — Telephone Encounter (Signed)
Pt needs to be seen before antibiotic  appt given

## 2012-11-26 NOTE — Patient Instructions (Signed)
Drink plenty of fluid Take medication as directed Take Mucinex one twice daily for cough and congestion If right ear lesion does not get better after this round of antibiotics, call us back and we will set up a visit with the dermatologist

## 2012-11-26 NOTE — Telephone Encounter (Signed)
Spoke with pt's daughter. Pt will need to be seen for assessment for and antibiotic.

## 2012-11-29 ENCOUNTER — Other Ambulatory Visit: Payer: Self-pay | Admitting: Family Medicine

## 2012-11-29 ENCOUNTER — Telehealth: Payer: Self-pay | Admitting: Family Medicine

## 2012-11-29 DIAGNOSIS — B373 Candidiasis of vulva and vagina: Secondary | ICD-10-CM

## 2012-11-29 DIAGNOSIS — B3731 Acute candidiasis of vulva and vagina: Secondary | ICD-10-CM

## 2012-11-29 MED ORDER — FLUCONAZOLE 100 MG PO TABS: 100.0000 mg | ORAL_TABLET | Freq: Every day | ORAL | Status: DC

## 2012-11-29 NOTE — Telephone Encounter (Signed)
Pt aware rx sent to [facility] for diflucan

## 2012-11-29 NOTE — Telephone Encounter (Signed)
Will order Diflucan in [facility].

## 2012-12-04 ENCOUNTER — Telehealth: Payer: Self-pay | Admitting: Family Medicine

## 2012-12-04 ENCOUNTER — Ambulatory Visit (INDEPENDENT_AMBULATORY_CARE_PROVIDER_SITE_OTHER): Payer: Medicare Other | Admitting: Urology

## 2012-12-04 DIAGNOSIS — N200 Calculus of kidney: Secondary | ICD-10-CM

## 2012-12-04 MED ORDER — LOSARTAN POTASSIUM 100 MG PO TABS: 100.0000 mg | ORAL_TABLET | Freq: Every day | ORAL | Status: DC

## 2012-12-04 MED ORDER — AMLODIPINE BESYLATE 2.5 MG PO TABS: 2.5000 mg | ORAL_TABLET | Freq: Every day | ORAL | Status: DC

## 2012-12-04 NOTE — Telephone Encounter (Signed)
meds sent to [facility]  No answer at pt home phone will request [facility] to call her when meds are filled

## 2012-12-04 NOTE — Telephone Encounter (Signed)
BP is high. Needs to be on her bp medications. Both of them. Thanks.

## 2012-12-13 ENCOUNTER — Encounter: Payer: Self-pay | Admitting: Family Medicine

## 2012-12-13 ENCOUNTER — Ambulatory Visit (INDEPENDENT_AMBULATORY_CARE_PROVIDER_SITE_OTHER): Payer: Medicare Other | Admitting: Family Medicine

## 2012-12-13 VITALS — BP 118/66 | HR 77 | Temp 98.2°F | Wt 127.0 lb

## 2012-12-13 DIAGNOSIS — N10 Acute tubulo-interstitial nephritis: Secondary | ICD-10-CM

## 2012-12-13 DIAGNOSIS — J9600 Acute respiratory failure, unspecified whether with hypoxia or hypercapnia: Secondary | ICD-10-CM

## 2012-12-13 DIAGNOSIS — Z720 Tobacco use: Secondary | ICD-10-CM

## 2012-12-13 DIAGNOSIS — E785 Hyperlipidemia, unspecified: Secondary | ICD-10-CM | POA: Insufficient documentation

## 2012-12-13 DIAGNOSIS — N200 Calculus of kidney: Secondary | ICD-10-CM

## 2012-12-13 DIAGNOSIS — Z8742 Personal history of other diseases of the female genital tract: Secondary | ICD-10-CM

## 2012-12-13 DIAGNOSIS — N390 Urinary tract infection, site not specified: Secondary | ICD-10-CM

## 2012-12-13 DIAGNOSIS — N823 Fistula of vagina to large intestine: Secondary | ICD-10-CM

## 2012-12-13 DIAGNOSIS — I214 Non-ST elevation (NSTEMI) myocardial infarction: Secondary | ICD-10-CM

## 2012-12-13 DIAGNOSIS — N179 Acute kidney failure, unspecified: Secondary | ICD-10-CM

## 2012-12-13 DIAGNOSIS — IMO0001 Reserved for inherently not codable concepts without codable children: Secondary | ICD-10-CM

## 2012-12-13 DIAGNOSIS — A419 Sepsis, unspecified organism: Secondary | ICD-10-CM

## 2012-12-13 DIAGNOSIS — R350 Frequency of micturition: Secondary | ICD-10-CM

## 2012-12-13 DIAGNOSIS — N824 Other female intestinal-genital tract fistulae: Secondary | ICD-10-CM

## 2012-12-13 DIAGNOSIS — F17200 Nicotine dependence, unspecified, uncomplicated: Secondary | ICD-10-CM

## 2012-12-13 LAB — POCT URINALYSIS DIPSTICK
Bilirubin, UA: NEGATIVE
Glucose, UA: NEGATIVE
Ketones, UA: NEGATIVE
Nitrite, UA: NEGATIVE
Spec Grav, UA: 1.025
Urobilinogen, UA: NEGATIVE
pH, UA: 6

## 2012-12-13 LAB — POCT UA - MICROSCOPIC ONLY
Casts, Ur, LPF, POC: NEGATIVE
Crystals, Ur, HPF, POC: NEGATIVE
Yeast, UA: NEGATIVE

## 2012-12-13 MED ORDER — FLUTICASONE PROPIONATE 50 MCG/ACT NA SUSP: 2.0000 | Freq: Every day | NASAL | Status: DC

## 2012-12-13 MED ORDER — LOSARTAN POTASSIUM 100 MG PO TABS: 100.0000 mg | ORAL_TABLET | Freq: Every day | ORAL | Status: DC

## 2012-12-13 MED ORDER — BUDESONIDE-FORMOTEROL FUMARATE 160-4.5 MCG/ACT IN AERO: 2.0000 | INHALATION_SPRAY | Freq: Two times a day (BID) | RESPIRATORY_TRACT | Status: DC

## 2012-12-13 MED ORDER — METOPROLOL TARTRATE 25 MG PO TABS: 25.0000 mg | ORAL_TABLET | Freq: Two times a day (BID) | ORAL | Status: DC

## 2012-12-13 NOTE — Progress Notes (Signed)
Patient ID: Alexa Key, female   DOB: 09-Aug-1950, 62 y.o.   MRN: [id number] SUBJECTIVE: CC: Chief Complaint  Patient presents with  . Follow-up    1 month follow up  urinary frequency  c/o nausea with eating thinks having anxiety issuees  . Medication Refill    refill toprolol and losartan    HPI: Here for follow up of her medical problems. denies Headache;denies Chest Pain;denies weakness;denies Shortness of Breath and orthopnea;denies Visual changes;denies palpitations;denies cough;denies pedal edema;denies symptoms of TIA or stroke;deniesClaudication symptoms. admits to Compliance with medications; denies Problems with medications.  Has Urologic surgery planned. will have pre-admit labs soon. Patient is on suppressive antibiotics by Dr Delorse Lek.  Needs refill of medications.  Patient says she has been cleared by the cardiologist.   Past Medical History  Diagnosis Date  . CAD (coronary artery disease)   . PVD (peripheral vascular disease)   . CVA (cerebral infarction)     TIAx2  . HTN (hypertension)   . HLD (hyperlipidemia)   . Tobacco abuse   . Diverticulitis 08/04/12  . S/P partial lobectomy of lung 1997    per [facility], incidental finding, path c/w benign lesion  . Stroke   . COPD (chronic obstructive pulmonary disease)   . Shortness of breath   . Fibromyalgia    Past Surgical History  Procedure Laterality Date  . Hysterectomoy    . Lumbar spine surgery    . Carpal tunnel release     History   Social History  . Marital Status: Divorced    Spouse Name: N/A    Number of Children: N/A  . Years of Education: N/A   Occupational History  . Not on file.   Social History Main Topics  . Smoking status: Current Every Day Smoker -- 1.00 packs/day for 49 years    Types: Cigarettes  . Smokeless tobacco: Not on file  . Alcohol Use: Not on file  . Drug Use: Not on file  . Sexual Activity: Not Currently   Other Topics Concern  . Not on file   Social History  Narrative  . No narrative on file   No family history on file. Current Outpatient Prescriptions on File Prior to Visit  Medication Sig Dispense Refill  . omeprazole (PRILOSEC) 20 MG capsule TAKE  ONE CAPSULE BY MOUTH EVERY MORNING  30 capsule  2  . amLODipine (NORVASC) 2.5 MG tablet Take 1 tablet (2.5 mg total) by mouth daily.  30 tablet  1  . aspirin 325 MG tablet Take 325 mg by mouth daily.      . cholecalciferol (VITAMIN D) 1000 UNITS tablet Take 2,000 Units by mouth daily.      . diazepam (VALIUM) 10 MG tablet Take 10 mg by mouth as needed.       . fenofibrate 160 MG tablet TAKE ONE TABLET BY MOUTH ONE TIME DAILY  30 tablet  4  . fluconazole (DIFLUCAN) 100 MG tablet Take 1 tablet (100 mg total) by mouth daily. once  1 tablet  0  . pravastatin (PRAVACHOL) 40 MG tablet Take 40 mg by mouth every evening.       No current facility-administered medications on file prior to visit.   Allergies  Allergen Reactions  . Penicillins Rash  . Sulfa Antibiotics Rash    There is no immunization history on file for this patient. Prior to Admission medications   Medication Sig Start Date End Date Taking? Authorizing Provider  metoprolol tartrate (LOPRESSOR) 25 MG  tablet Take 1 tablet (25 mg total) by mouth 2 (two) times daily. 12/13/12  Yes Ileana Ladd, MD  omeprazole (PRILOSEC) 20 MG capsule TAKE  ONE CAPSULE BY MOUTH EVERY MORNING 11/18/12  Yes Ileana Ladd, MD  amLODipine (NORVASC) 2.5 MG tablet Take 1 tablet (2.5 mg total) by mouth daily. 12/04/12   Ileana Ladd, MD  aspirin 325 MG tablet Take 325 mg by mouth daily.    Historical Provider, MD  budesonide-formoterol (SYMBICORT) 160-4.5 MCG/ACT inhaler Inhale 2 puffs into the lungs 2 (two) times daily. 12/13/12   Ileana Ladd, MD  cefdinir (OMNICEF) 300 MG capsule  11/11/12   Historical Provider, MD  cholecalciferol (VITAMIN D) 1000 UNITS tablet Take 2,000 Units by mouth daily.    Historical Provider, MD  diazepam (VALIUM) 10 MG tablet Take  10 mg by mouth as needed.  11/12/12   Historical Provider, MD  fenofibrate 160 MG tablet TAKE ONE TABLET BY MOUTH ONE TIME DAILY 10/21/12   Ileana Ladd, MD  fluconazole (DIFLUCAN) 100 MG tablet Take 1 tablet (100 mg total) by mouth daily. once 11/29/12   Ileana Ladd, MD  fluticasone (FLONASE) 50 MCG/ACT nasal spray Place 2 sprays into the nose daily. 12/13/12   Ileana Ladd, MD  losartan (COZAAR) 100 MG tablet Take 1 tablet (100 mg total) by mouth daily. 12/13/12   Ileana Ladd, MD  pravastatin (PRAVACHOL) 40 MG tablet Take 40 mg by mouth every evening.    Historical Provider, MD      ROS: As above in the HPI. All other systems are stable or negative.  OBJECTIVE: APPEARANCE:  Patient in no acute distress.The patient appeared well nourished and normally developed. Acyanotic. Waist: VITAL SIGNS:BP 118/66  Pulse 77  Temp(Src) 98.2 F (36.8 C) (Oral)  Wt 127 lb (57.607 kg)  BMI 24.01 kg/m2 WF Smells of tobacco  SKIN: warm and  Dry without overt rashes, tattoos and scars  HEAD and Neck: without JVD, Head and scalp: normal Eyes:No scleral icterus. Fundi normal, eye movements normal. Ears: Auricle normal, canal normal, Tympanic membranes normal, insufflation normal. Nose: normal Throat: normal Neck & thyroid: normal  CHEST & LUNGS: Chest wall: normal Lungs: Clear  CVS: Reveals the PMI to be normally located. Regular rhythm, First and Second Heart sounds are normal,  absence of murmurs, rubs or gallops. Peripheral vasculature: Radial pulses: normal Dorsal pedis pulses: normal Posterior pulses: normal  ABDOMEN:  Appearance: normal Benign, no organomegaly, no masses, no Abdominal Aortic enlargement. No Guarding , no rebound. No Bruits. Bowel sounds: normal  RECTAL: N/A GU: N/A  EXTREMETIES: nonedematous..  ASSESSMENT: Frequency - Plan: POCT UA - Microscopic Only, POCT urinalysis dipstick, Urine culture  NSTEMI (non-ST elevated myocardial  infarction)  Pyelonephritis, acute  Tobacco user  Urosepsis  Acute renal failure  Acute respiratory failure  Kidney stone  Rectovaginal fistula  History of vaginal discharge  HLD (hyperlipidemia)    PLAN: Orders Placed This Encounter  Procedures  . Urine culture  . POCT UA - Microscopic Only  . POCT urinalysis dipstick    Results for orders placed in visit on 12/13/12  POCT UA - MICROSCOPIC ONLY      Result Value Range   WBC, Ur, HPF, POC tntc     RBC, urine, microscopic tntc     Bacteria, U Microscopic many     Mucus, UA mod     Epithelial cells, urine per micros few     Crystals, Ur, HPF,  POC neg     Casts, Ur, LPF, POC neg     Yeast, UA neg    POCT URINALYSIS DIPSTICK      Result Value Range   Color, UA yellow     Clarity, UA cloudy     Glucose, UA neg     Bilirubin, UA neg     Ketones, UA neg     Spec Grav, UA 1.025     Blood, UA 3+     pH, UA 6.0     Protein, UA 2+     Urobilinogen, UA negative     Nitrite, UA neg     Leukocytes, UA large (3+)     Meds ordered this encounter  Medications  . cefdinir (OMNICEF) 300 MG capsule    Sig:   . losartan (COZAAR) 100 MG tablet    Sig: Take 1 tablet (100 mg total) by mouth daily.    Dispense:  30 tablet    Refill:  1  . metoprolol tartrate (LOPRESSOR) 25 MG tablet    Sig: Take 1 tablet (25 mg total) by mouth 2 (two) times daily.    Dispense:  30 tablet    Refill:  0  . fluticasone (FLONASE) 50 MCG/ACT nasal spray    Sig: Place 2 sprays into the nose daily.    Dispense:  16 g    Refill:  6  . budesonide-formoterol (SYMBICORT) 160-4.5 MCG/ACT inhaler    Sig: Inhale 2 puffs into the lungs 2 (two) times daily.    Dispense:  1 Inhaler    Refill:  12   Medications Discontinued During This Encounter  Medication Reason  . azithromycin (ZITHROMAX) 250 MG tablet Completed Course  . doxycycline (VIBRA-TABS) 100 MG tablet Completed Course  . ondansetron (ZOFRAN) 4 MG tablet Completed Course  . losartan  (COZAAR) 100 MG tablet Reorder  . metoprolol tartrate (LOPRESSOR) 25 MG tablet Reorder    Smoking cessation counseling for 7 minutes again, especially in preparation of surgery.  Return in about 4 weeks (around 01/10/2013) for Recheck medical problems.  Jong Rickman P. Modesto Charon, M.D.

## 2012-12-14 ENCOUNTER — Encounter (HOSPITAL_COMMUNITY): Payer: Self-pay | Admitting: Pharmacy Technician

## 2012-12-18 NOTE — Progress Notes (Signed)
Urine culture results

## 2012-12-19 NOTE — Patient Instructions (Signed)
Alexa Key  12/19/2012   Your procedure is scheduled on:  12/24/12  REport to RADIOLOGY at __________             Procedure time at   Call this number if you have problems the morning of surgery: [phone]   Remember:   Do not eat food or drink liquids after midnight.   Take these medicines the morning of surgery with A SIP OF WATER:    Do not wear jewelry, make-up or nail polish.  Do not wear lotions, powders, or perfumes.  Do not shave 48 hours prior to surgery.   Do not bring valuables to the hospital.  Contacts, dentures or bridgework may not be worn into surgery.  Leave suitcase in the car. After surgery it may be brought to your room.  For patients admitted to the hospital, checkout time is 11:00 AM the day of  discharge.      SEE CHG INSTRUCTION SHEET    Please read over the following fact sheets that you were given: , coughing and deep breathing exercises, leg exercises               Failure to comply with these instructions may result in cancellation of your surgery.                Patient Signature ____________________________              Nurse Signature _____________________________

## 2012-12-20 ENCOUNTER — Encounter (HOSPITAL_COMMUNITY)
Admission: RE | Admit: 2012-12-20 | Discharge: 2012-12-20 | Disposition: A | Discharge status: Home / Self Care | Payer: Medicare Other | Source: Ambulatory Visit | Attending: Urology | Admitting: Urology

## 2012-12-20 ENCOUNTER — Other Ambulatory Visit: Payer: Self-pay

## 2012-12-20 ENCOUNTER — Other Ambulatory Visit: Payer: Self-pay | Admitting: Radiology

## 2012-12-20 ENCOUNTER — Encounter (HOSPITAL_COMMUNITY): Payer: Self-pay | Admitting: *Deleted

## 2012-12-20 ENCOUNTER — Emergency Department (HOSPITAL_COMMUNITY)
Admission: EM | Admit: 2012-12-20 | Discharge: 2012-12-20 | Disposition: A | Discharge status: Home / Self Care | Payer: Medicare Other | Attending: Emergency Medicine | Admitting: Emergency Medicine

## 2012-12-20 ENCOUNTER — Telehealth: Payer: Self-pay | Admitting: Family Medicine

## 2012-12-20 ENCOUNTER — Ambulatory Visit (HOSPITAL_COMMUNITY)
Admission: RE | Admit: 2012-12-20 | Discharge: 2012-12-20 | Disposition: A | Discharge status: Home / Self Care | Payer: Medicare Other | Source: Ambulatory Visit | Attending: Urology | Admitting: Urology

## 2012-12-20 ENCOUNTER — Encounter (HOSPITAL_COMMUNITY): Payer: Self-pay

## 2012-12-20 ENCOUNTER — Telehealth: Payer: Self-pay

## 2012-12-20 DIAGNOSIS — Z9861 Coronary angioplasty status: Secondary | ICD-10-CM | POA: Insufficient documentation

## 2012-12-20 DIAGNOSIS — I252 Old myocardial infarction: Secondary | ICD-10-CM | POA: Insufficient documentation

## 2012-12-20 DIAGNOSIS — I2510 Atherosclerotic heart disease of native coronary artery without angina pectoris: Secondary | ICD-10-CM | POA: Insufficient documentation

## 2012-12-20 DIAGNOSIS — Z8679 Personal history of other diseases of the circulatory system: Secondary | ICD-10-CM | POA: Insufficient documentation

## 2012-12-20 DIAGNOSIS — E785 Hyperlipidemia, unspecified: Secondary | ICD-10-CM | POA: Insufficient documentation

## 2012-12-20 DIAGNOSIS — Z8673 Personal history of transient ischemic attack (TIA), and cerebral infarction without residual deficits: Secondary | ICD-10-CM | POA: Insufficient documentation

## 2012-12-20 DIAGNOSIS — Z8719 Personal history of other diseases of the digestive system: Secondary | ICD-10-CM | POA: Insufficient documentation

## 2012-12-20 DIAGNOSIS — K219 Gastro-esophageal reflux disease without esophagitis: Secondary | ICD-10-CM | POA: Insufficient documentation

## 2012-12-20 DIAGNOSIS — IMO0001 Reserved for inherently not codable concepts without codable children: Secondary | ICD-10-CM | POA: Insufficient documentation

## 2012-12-20 DIAGNOSIS — R110 Nausea: Secondary | ICD-10-CM | POA: Insufficient documentation

## 2012-12-20 DIAGNOSIS — I10 Essential (primary) hypertension: Secondary | ICD-10-CM | POA: Insufficient documentation

## 2012-12-20 DIAGNOSIS — Z01812 Encounter for preprocedural laboratory examination: Secondary | ICD-10-CM | POA: Insufficient documentation

## 2012-12-20 DIAGNOSIS — Z01818 Encounter for other preprocedural examination: Secondary | ICD-10-CM | POA: Insufficient documentation

## 2012-12-20 DIAGNOSIS — F411 Generalized anxiety disorder: Secondary | ICD-10-CM | POA: Insufficient documentation

## 2012-12-20 DIAGNOSIS — Z880 Allergy status to penicillin: Secondary | ICD-10-CM | POA: Insufficient documentation

## 2012-12-20 DIAGNOSIS — I498 Other specified cardiac arrhythmias: Secondary | ICD-10-CM | POA: Insufficient documentation

## 2012-12-20 DIAGNOSIS — Z01810 Encounter for preprocedural cardiovascular examination: Secondary | ICD-10-CM | POA: Insufficient documentation

## 2012-12-20 DIAGNOSIS — J441 Chronic obstructive pulmonary disease with (acute) exacerbation: Secondary | ICD-10-CM | POA: Insufficient documentation

## 2012-12-20 DIAGNOSIS — R9431 Abnormal electrocardiogram [ECG] [EKG]: Secondary | ICD-10-CM | POA: Insufficient documentation

## 2012-12-20 DIAGNOSIS — E119 Type 2 diabetes mellitus without complications: Secondary | ICD-10-CM | POA: Insufficient documentation

## 2012-12-20 DIAGNOSIS — Z79899 Other long term (current) drug therapy: Secondary | ICD-10-CM | POA: Insufficient documentation

## 2012-12-20 DIAGNOSIS — N200 Calculus of kidney: Secondary | ICD-10-CM | POA: Insufficient documentation

## 2012-12-20 DIAGNOSIS — F17200 Nicotine dependence, unspecified, uncomplicated: Secondary | ICD-10-CM | POA: Insufficient documentation

## 2012-12-20 DIAGNOSIS — R42 Dizziness and giddiness: Secondary | ICD-10-CM | POA: Insufficient documentation

## 2012-12-20 DIAGNOSIS — Z7982 Long term (current) use of aspirin: Secondary | ICD-10-CM | POA: Insufficient documentation

## 2012-12-20 DIAGNOSIS — R5381 Other malaise: Secondary | ICD-10-CM | POA: Insufficient documentation

## 2012-12-20 DIAGNOSIS — R001 Bradycardia, unspecified: Secondary | ICD-10-CM

## 2012-12-20 HISTORY — DX: Other specified postprocedural states: Z98.890

## 2012-12-20 HISTORY — DX: Anxiety disorder, unspecified: F41.9

## 2012-12-20 HISTORY — DX: Gastro-esophageal reflux disease without esophagitis: K21.9

## 2012-12-20 HISTORY — DX: Other specified postprocedural states: R11.2

## 2012-12-20 HISTORY — DX: Type 2 diabetes mellitus without complications: E11.9

## 2012-12-20 LAB — DIFFERENTIAL
Eosinophils Absolute: 0.1 10*3/uL (ref 0.0–0.7)
Eosinophils Relative: 1 % (ref 0–5)
Lymphocytes Relative: 25 % (ref 12–46)
Lymphs Abs: 2.9 10*3/uL (ref 0.7–4.0)
Monocytes Absolute: 0.6 10*3/uL (ref 0.1–1.0)

## 2012-12-20 LAB — CBC
Hemoglobin: 12.7 g/dL (ref 12.0–15.0)
MCH: 32.6 pg (ref 26.0–34.0)
MCHC: 31.8 g/dL (ref 30.0–36.0)
RDW: 14.7 % (ref 11.5–15.5)

## 2012-12-20 LAB — BASIC METABOLIC PANEL
Calcium: 10 mg/dL (ref 8.4–10.5)
GFR calc Af Amer: 38 mL/min — ABNORMAL LOW (ref >90)
GFR calc non Af Amer: 33 mL/min — ABNORMAL LOW (ref >90)
Glucose, Bld: 97 mg/dL (ref 70–99)
Potassium: 5.1 mEq/L (ref 3.5–5.1)
Sodium: 144 mEq/L (ref 135–145)

## 2012-12-20 LAB — PROTIME-INR: INR: 1 (ref 0.00–1.49)

## 2012-12-20 NOTE — Progress Notes (Signed)
ECHO 07/2012 in EPIC Stress 6/14 in EPIC LOV with Dr Antoine Poche 08/2012 in [facility] CATH 4/14 in EPIC STENT 4/14 [facility]  Last office visit with Dr Modesto Charon - 12/13/12.  In EPIC.

## 2012-12-20 NOTE — Telephone Encounter (Signed)
Per lab only preliminary report back --per April in lab "culture been plated 3x"  Daughter aware and will call her when final results in.

## 2012-12-20 NOTE — Progress Notes (Addendum)
Patient came in for preop appointment today at 1430pm.  Patient states she has been having dry heaves, dizziness on 12/19/12 while lying in bed , not eating and sinus drainage.  Pulse on dinemapp was 44, manual check was 48 and ekg was 46.  Called office of Dr Modesto Charon( PCP) and spoke with Almira Coaster, nurse.  Almira Coaster spoke with Dr Modesto Charon.  Daughter here with patient.  Patient and daughter informed that Dr Modesto Charon will see her on 12/21/12 in office and they will call her with time.  Patient was instructed that if anything chagnes in her condition to call the office and Dr Modesto Charon.  Both patient and daughter voice understanding.  EKG was faxed with vital signs noted to office of Dr Modesto Charon.  Called office of Dr Modesto Charon and they have received EKG.  Patient denied any dizzines at time of preop appointment.

## 2012-12-20 NOTE — Telephone Encounter (Signed)
Spoke with Paula Compton the preop surgical center and she reports pt HR is 44on the dyanmap and 48 manually  C/o light-headed in be yest. Felt sick to stomach --no vomiting not eating and dry heaves . bp 172/52 temp 98.2 HR 44 and sats @100 %. Reports some sinus drainage. .    Per Dr Modesto Charon go to ER to get evaluated to make sure not dehydrated--BJ daughter aware to take her to [facility]

## 2012-12-20 NOTE — Progress Notes (Signed)
Recent bronchitis on 11/26/12 with Zpack per patient

## 2012-12-20 NOTE — ED Provider Notes (Signed)
I saw and evaluated the patient, reviewed the resident's note and I agree with the findings and plan.  Patient sent here for concerns of bradycardia. Noted to be bradycardic in her preop screening for a urologic procedure. Patient denies any other systemic symptoms. She did not increase her take any extra beta blockers today. Patient denies any major issues other than dealing with her UTIs for which she started on antibiotics. EKG initially with sinus rhythm with rate in the 40s, however my exam, patient's heart rate stayed in the 50s and entire time. I do not like her bradycardic symptomatic. She does not want to stay in the hospital but agrees to labs checked labs are normal. Patient stable for discharge.   Dagmar Hait, MD 12/20/12 [phone]

## 2012-12-20 NOTE — ED Provider Notes (Signed)
CSN: [id number]     Arrival date & time 12/20/12  1623 History     First MD Initiated Contact with Patient 12/20/12 1641     Chief Complaint  Patient presents with  . Bradycardia  . Nausea   (Consider location/radiation/quality/duration/timing/severity/associated sxs/prior Treatment) Patient is a 62 y.o. female presenting with general illness. The history is provided by the patient and a relative.  Illness Quality:  Feels intermittently dizzy and weak along with some recurring nausea Severity:  Moderate Onset quality:  Gradual Duration:  1 week Timing:  Intermittent Progression:  Unchanged Chronicity:  New Context:  At rest and sporatic Relieved by:  Rest Worsened by:  Exertion Ineffective treatments:  None tried Associated symptoms: fatigue and nausea   Associated symptoms: no abdominal pain, no chest pain, no congestion, no cough, no diarrhea, no ear pain, no fever, no headaches, no rash, no shortness of breath, no sore throat, no vomiting and no wheezing     Past Medical History  Diagnosis Date  . CAD (coronary artery disease)   . PVD (peripheral vascular disease)   . CVA (cerebral infarction)     TIAx2  . HTN (hypertension)   . HLD (hyperlipidemia)   . Tobacco abuse   . Diverticulitis 08/04/12  . S/P partial lobectomy of lung 1997    per [facility], incidental finding, path c/w benign lesion  . Stroke   . COPD (chronic obstructive pulmonary disease)   . Shortness of breath   . Fibromyalgia   . PONV (postoperative nausea and vomiting)   . Myocardial infarction     07/2012   . Anxiety   . Diabetes mellitus without complication     on no meds   . GERD (gastroesophageal reflux disease)    Past Surgical History  Procedure Laterality Date  . Hysterectomoy    . Lumbar spine surgery    . Carpal tunnel release    . Coronary angioplasty    . Coronary stents     . Partial lobectomy of lung     . Abdominal hysterectomy    . Back surgery     History reviewed. No  pertinent family history. History  Substance Use Topics  . Smoking status: Current Every Day Smoker -- 0.25 packs/day for 49 years    Types: Cigarettes  . Smokeless tobacco: Never Used  . Alcohol Use: No   OB History   Grav Para Term Preterm Abortions TAB SAB Ect Mult Living                 Review of Systems  Constitutional: Positive for fatigue. Negative for fever, chills and diaphoresis.  HENT: Negative for ear pain, congestion, sore throat, facial swelling, mouth sores, trouble swallowing, neck pain and neck stiffness.   Eyes: Negative.   Respiratory: Negative for apnea, cough, chest tightness, shortness of breath and wheezing.   Cardiovascular: Negative for chest pain, palpitations and leg swelling.  Gastrointestinal: Positive for nausea. Negative for vomiting, abdominal pain, diarrhea and abdominal distention.  Genitourinary: Negative for hematuria, flank pain, vaginal discharge, difficulty urinating and menstrual problem.  Musculoskeletal: Negative for back pain and gait problem.  Skin: Negative for rash and wound.  Neurological: Positive for dizziness and weakness. Negative for tremors, seizures, syncope, facial asymmetry, numbness and headaches.  Psychiatric/Behavioral: Negative.   All other systems reviewed and are negative.    Allergies  Penicillins and Sulfa antibiotics  Home Medications   Current Outpatient Rx  Name  Route  Sig  Dispense  Refill  . acetaminophen (TYLENOL) 500 MG tablet   Oral   Take 1,000 mg by mouth every 6 (six) hours as needed for pain.         Marland Kitchen amLODipine (NORVASC) 2.5 MG tablet   Oral   Take 2.5 mg by mouth every morning.         Marland Kitchen aspirin 325 MG tablet   Oral   Take 325 mg by mouth daily.         . budesonide-formoterol (SYMBICORT) 160-4.5 MCG/ACT inhaler   Inhalation   Inhale 2 puffs into the lungs 2 (two) times daily.   1 Inhaler   12   . cholecalciferol (VITAMIN D) 1000 UNITS tablet   Oral   Take 2,000 Units by mouth  at bedtime.          Marland Kitchen doxycycline (VIBRA-TABS) 100 MG tablet   Oral   Take 100 mg by mouth daily.         . fenofibrate 160 MG tablet   Oral   Take 160 mg by mouth every morning.         Marland Kitchen losartan (COZAAR) 100 MG tablet   Oral   Take 100 mg by mouth daily.         . metoprolol tartrate (LOPRESSOR) 25 MG tablet   Oral   Take 1 tablet (25 mg total) by mouth 2 (two) times daily.   30 tablet   0   . omeprazole (PRILOSEC) 20 MG capsule   Oral   Take 20 mg by mouth daily.         . pravastatin (PRAVACHOL) 40 MG tablet   Oral   Take 40 mg by mouth at bedtime.          . diazepam (VALIUM) 10 MG tablet   Oral   Take 10 mg by mouth as needed (for upcoming surgery).           BP 152/82  Pulse 57  Temp(Src) 98.2 F (36.8 C) (Oral)  Resp 20  SpO2 100% Physical Exam  Nursing note and vitals reviewed. Constitutional: She is oriented to person, place, and time. She appears well-developed and well-nourished. No distress.  HENT:  Head: Normocephalic and atraumatic.  Right Ear: External ear normal.  Left Ear: External ear normal.  Nose: Nose normal.  Mouth/Throat: Oropharynx is clear and moist. No oropharyngeal exudate.  Eyes: Conjunctivae and EOM are normal. Pupils are equal, round, and reactive to light. Right eye exhibits no discharge. Left eye exhibits no discharge.  Neck: Normal range of motion. Neck supple. No JVD present. No tracheal deviation present. No thyromegaly present.  Cardiovascular: Regular rhythm, normal heart sounds and intact distal pulses.  Bradycardia present.  Exam reveals no gallop and no friction rub.   No murmur heard. Pulmonary/Chest: Effort normal and breath sounds normal. No respiratory distress. She has no wheezes. She has no rales. She exhibits no tenderness.  Abdominal: Soft. Bowel sounds are normal. She exhibits no distension. There is no tenderness. There is no rebound and no guarding.  Musculoskeletal: Normal range of motion.   Lymphadenopathy:    She has no cervical adenopathy.  Neurological: She is alert and oriented to person, place, and time. No cranial nerve deficit. Coordination normal.  Skin: Skin is warm. No rash noted. She is not diaphoretic.  Psychiatric: She has a normal mood and affect. Her behavior is normal. Judgment and thought content normal.    ED Course   Procedures (including critical care  time)  Labs Reviewed  POCT I-STAT TROPONIN I   Dg Chest 2 View  12/20/2012   CLINICAL DATA:  Kidney stone removal, preop.  EXAM: CHEST  2 VIEW  COMPARISON:  08/09/2012  FINDINGS: Mild cardiomegaly. Small chronic right pleural effusion versus pleural thickening. Probable scarring at the right lung base. Postoperative changes on the right. Left lung is clear. No acute bony abnormality.  Partially imaged is a right ureteral stent and right nephrolithiasis.  IMPRESSION: Postoperative changes in the right with small chronic right pleural effusion versus pleural thickening. Right basilar scarring.  Mild cardiomegaly.   Electronically Signed   By: Charlett Nose   On: 12/20/2012 15:52   1. Bradycardia     MDM  62 yr old F pt w/ PMH of CAD recurring UTI's and kidney stones here with bradycardia and dizziness and nausea. Patient says she has been intermittently feeling this way for the past week. She says it will happen at rest. She denies any pain. No CP, SOB, abdominal pain, vomiting, diarrhea. She says she has been treated for constant UTI for months now and has a uretral stent in place and was supposed to have stones removed by urologist today when they noticed that she was bradycardic. The PCP thought the bradycardia was concerning and then sent her here for evaluation. Patient again with no CP so doubt ACS. Since it has been going on for one week will get CXR, EKG and troponin. It could be from BB toxicity.   Normal labs done at other office visit today. Reassuring EKG and normal CXR done at previous visit today.  Normal troponin. Not likely ACS as symptoms have been ongoing for a week and seem unrelated to ACS. Could be from BB. Will have patient not take BB tonight or tomorrow. Patient will be able to see PCP tomorrow for re-check and would like to go home. She is currently hemodynamically stable with no new complaints and would like to follow up with PCP tomorrow.   Date: 12/20/2012  Rate: 44  Rhythm: sinus bradycardia  QRS Axis: normal  Intervals: normal  ST/T Wave abnormalities: very mild ST depressions laterally.  Conduction Disutrbances:none  Narrative Interpretation: sinus bradycardia  Old EKG Reviewed: unchanged  Patient HR improved to the low 60's at discharge  Case discussed with Dr. Murlean Caller, MD 12/20/12 [phone]

## 2012-12-20 NOTE — ED Notes (Signed)
Pt reports having preop today for kidney stone surgery and sent here due to HR 47. Pt reports not feeling well x 1 week, having dizziness and nausea, no appetite. ekg done at triage.

## 2012-12-21 ENCOUNTER — Encounter: Payer: Self-pay | Admitting: Family Medicine

## 2012-12-21 ENCOUNTER — Ambulatory Visit (INDEPENDENT_AMBULATORY_CARE_PROVIDER_SITE_OTHER): Payer: Medicare Other | Admitting: Family Medicine

## 2012-12-21 ENCOUNTER — Telehealth: Payer: Self-pay | Admitting: Family Medicine

## 2012-12-21 VITALS — BP 156/65 | HR 48 | Temp 97.3°F | Wt 124.4 lb

## 2012-12-21 DIAGNOSIS — R001 Bradycardia, unspecified: Secondary | ICD-10-CM

## 2012-12-21 DIAGNOSIS — I214 Non-ST elevation (NSTEMI) myocardial infarction: Secondary | ICD-10-CM

## 2012-12-21 DIAGNOSIS — N200 Calculus of kidney: Secondary | ICD-10-CM

## 2012-12-21 DIAGNOSIS — I498 Other specified cardiac arrhythmias: Secondary | ICD-10-CM

## 2012-12-21 DIAGNOSIS — N10 Acute tubulo-interstitial nephritis: Secondary | ICD-10-CM

## 2012-12-21 DIAGNOSIS — N179 Acute kidney failure, unspecified: Secondary | ICD-10-CM

## 2012-12-21 DIAGNOSIS — E785 Hyperlipidemia, unspecified: Secondary | ICD-10-CM

## 2012-12-21 DIAGNOSIS — Z720 Tobacco use: Secondary | ICD-10-CM

## 2012-12-21 DIAGNOSIS — F17200 Nicotine dependence, unspecified, uncomplicated: Secondary | ICD-10-CM

## 2012-12-21 LAB — URINE CULTURE

## 2012-12-21 LAB — ABO/RH

## 2012-12-21 NOTE — Patient Instructions (Signed)
Restart  A half a tablet of metoprolol twice a day if heart rate comes up to 60 per minute.  Return in 1 week for  Recheck.

## 2012-12-21 NOTE — Progress Notes (Signed)
Patient ID: Alexa Key, female   DOB: [birth year], 62 y.o.   MRN: [id number] SUBJECTIVE: CC: Chief Complaint  Patient presents with  . Hospitalization Follow-up    low heart rate ranging in the 40's sch for surgery for kidney stones     HPI: Seen in the ED yesterday. ewas in pre-op yesterday for prepping for Urologic surgery next week. She was nauseaous , having dry heaves and had Bradycardia. Feeling better now. Nausea, bradycardia from the beta blocker. Has held last night and this am dose of Beta blocker. Better today . Came for follow up.  Past Medical History  Diagnosis Date  . CAD (coronary artery disease)   . PVD (peripheral vascular disease)   . CVA (cerebral infarction)     TIAx2  . HTN (hypertension)   . HLD (hyperlipidemia)   . Tobacco abuse   . Diverticulitis 08/04/12  . S/P partial lobectomy of lung 1997    per [facility], incidental finding, path c/w benign lesion  . Stroke   . COPD (chronic obstructive pulmonary disease)   . Shortness of breath   . Fibromyalgia   . PONV (postoperative nausea and vomiting)   . Myocardial infarction     07/2012   . Anxiety   . Diabetes mellitus without complication     on no meds   . GERD (gastroesophageal reflux disease)    Past Surgical History  Procedure Laterality Date  . Hysterectomoy    . Lumbar spine surgery    . Carpal tunnel release    . Coronary angioplasty    . Coronary stents     . Partial lobectomy of lung     . Abdominal hysterectomy    . Back surgery     History   Social History  . Marital Status: Divorced    Spouse Name: N/A    Number of Children: N/A  . Years of Education: N/A   Occupational History  . Not on file.   Social History Main Topics  . Smoking status: Current Every Day Smoker -- 0.25 packs/day for 49 years    Types: Cigarettes  . Smokeless tobacco: Never Used  . Alcohol Use: No  . Drug Use: No  . Sexual Activity: Not Currently   Other Topics Concern  . Not on file   Social  History Narrative  . No narrative on file   No family history on file. Current Outpatient Prescriptions on File Prior to Visit  Medication Sig Dispense Refill  . acetaminophen (TYLENOL) 500 MG tablet Take 1,000 mg by mouth every 6 (six) hours as needed for pain.      Marland Kitchen amLODipine (NORVASC) 2.5 MG tablet Take 2.5 mg by mouth every morning.      Marland Kitchen aspirin 325 MG tablet Take 325 mg by mouth daily.      . budesonide-formoterol (SYMBICORT) 160-4.5 MCG/ACT inhaler Inhale 2 puffs into the lungs 2 (two) times daily.  1 Inhaler  12  . cholecalciferol (VITAMIN D) 1000 UNITS tablet Take 2,000 Units by mouth at bedtime.       . diazepam (VALIUM) 10 MG tablet Take 10 mg by mouth as needed (for upcoming surgery).       Marland Kitchen doxycycline (VIBRA-TABS) 100 MG tablet Take 100 mg by mouth daily.      . fenofibrate 160 MG tablet Take 160 mg by mouth every morning.      Marland Kitchen losartan (COZAAR) 100 MG tablet Take 100 mg by mouth daily.      Marland Kitchen  omeprazole (PRILOSEC) 20 MG capsule Take 20 mg by mouth daily.      . pravastatin (PRAVACHOL) 40 MG tablet Take 40 mg by mouth at bedtime.        No current facility-administered medications on file prior to visit.   Allergies  Allergen Reactions  . Penicillins Rash  . Sulfa Antibiotics Rash    There is no immunization history on file for this patient. Prior to Admission medications   Medication Sig Start Date End Date Taking? Authorizing Provider  acetaminophen (TYLENOL) 500 MG tablet Take 1,000 mg by mouth every 6 (six) hours as needed for pain.   Yes Historical Provider, MD  amLODipine (NORVASC) 2.5 MG tablet Take 2.5 mg by mouth every morning.   Yes Historical Provider, MD  aspirin 325 MG tablet Take 325 mg by mouth daily.   Yes Historical Provider, MD  budesonide-formoterol (SYMBICORT) 160-4.5 MCG/ACT inhaler Inhale 2 puffs into the lungs 2 (two) times daily. 12/13/12  Yes Ileana Ladd, MD  cholecalciferol (VITAMIN D) 1000 UNITS tablet Take 2,000 Units by mouth at  bedtime.    Yes Historical Provider, MD  diazepam (VALIUM) 10 MG tablet Take 10 mg by mouth as needed (for upcoming surgery).  11/12/12  Yes Historical Provider, MD  doxycycline (VIBRA-TABS) 100 MG tablet Take 100 mg by mouth daily.   Yes Historical Provider, MD  fenofibrate 160 MG tablet Take 160 mg by mouth every morning.   Yes Historical Provider, MD  losartan (COZAAR) 100 MG tablet Take 100 mg by mouth daily.   Yes Historical Provider, MD  metoprolol tartrate (LOPRESSOR) 12.5 mg TABS tablet Take 0.5 tablets (12.5 mg total) by mouth 2 (two) times daily. Start 12/22/2012 if heart rate comes up to 60/minute. 12/21/12  Yes Ileana Ladd, MD  omeprazole (PRILOSEC) 20 MG capsule Take 20 mg by mouth daily.   Yes Historical Provider, MD  pravastatin (PRAVACHOL) 40 MG tablet Take 40 mg by mouth at bedtime.    Yes Historical Provider, MD     ROS: As above in the HPI. All other systems are stable or negative.  OBJECTIVE: APPEARANCE:  Patient in no acute distress.The patient appeared well nourished and normally developed. Acyanotic. Waist: VITAL SIGNS:BP 156/65  Pulse 48  Temp(Src) 97.3 F (36.3 C) (Oral)  Wt 124 lb 6.4 oz (56.427 kg)  BMI 23.52 kg/m2 Recheck BP 142/70 HR 50 WF  SKIN: warm and  Dry without overt rashes, tattoos and scars  HEAD and Neck: without JVD, Head and scalp: normal Eyes:No scleral icterus. Fundi normal, eye movements normal. Ears: Auricle normal, canal normal, Tympanic membranes normal, insufflation normal. Nose: normal Throat: normal Neck & thyroid: normal  CHEST & LUNGS: Chest wall: normal Lungs: Clear  CVS: Reveals the PMI to be normally located. Regular rhythm, First and Second Heart sounds are normal,  absence of murmurs, rubs or gallops. Peripheral vasculature: Radial pulses: normal Dorsal pedis pulses: normal Posterior pulses: normal  ABDOMEN:  Appearance: obese Benign, no organomegaly, no masses, no Abdominal Aortic enlargement. No Guarding  , no rebound. No Bruits. Bowel sounds: normal  RECTAL: N/A GU: N/A  EXTREMETIES: nonedematous.  MUSCULOSKELETAL:  Spine: normal Joints: intact  NEUROLOGIC: oriented to time,place and person; nonfocal. Strength is normal Sensory is normal Reflexes are normal Cranial Nerves are normal.  ASSESSMENT: HLD (hyperlipidemia)  NSTEMI (non-ST elevated myocardial infarction)  Pyelonephritis, acute  Tobacco user  Acute renal failure  Kidney stone  Bradycardia  PLAN:  Meds ordered this encounter  Medications  .  metoprolol tartrate (LOPRESSOR) 12.5 mg TABS tablet    Sig: Take 0.5 tablets (12.5 mg total) by mouth 2 (two) times daily. Start 12/22/2012 if heart rate comes up to 60/minute.    Dispense:  30 tablet    Refill:  0    Results for orders placed during the hospital encounter of 12/20/12  POCT I-STAT TROPONIN I      Result Value Range   Troponin i, poc 0.03  0.00 - 0.08 ng/mL   Comment 3             Medications Discontinued During This Encounter  Medication Reason  . metoprolol tartrate (LOPRESSOR) 25 MG tablet Reorder    Return in about 1 week (around 12/28/2012) for recheck BP.  Thom Ollinger P. Modesto Charon, M.D.

## 2012-12-21 NOTE — Progress Notes (Signed)
Patient seen in ED on 12/20/12 related to issues of bradycardia at preop appointment.    Dr Modesto Charon office had patient go to ED to be evaluated.  Patient discharged from ED.   Left Yates Decamp a message on voice mail regarding the issue of bradycardia on 12/20/12 and nausea, dry heaves and dizziness.  Left on voice mail to note encounters in EPIC along with progress notes.

## 2012-12-21 NOTE — Telephone Encounter (Signed)
Pt.notified

## 2012-12-23 MED ORDER — GENTAMICIN SULFATE 40 MG/ML IJ SOLN
280.0000 mg | INTRAVENOUS | Status: AC
  Administered 2012-12-24: 280 mg via INTRAVENOUS
  Filled 2012-12-23: qty 7

## 2012-12-23 NOTE — H&P (Signed)
H&P  Chief Complaint: Kidney stones  History of Present Illness: Alexa Key is a 62 y.o. year old who presents for elective R PCNL. Her history follows:  This 62 year old female comes in today for treatment of urolithiasis. I do not have any old records on her referring to her hospitalization in April, 2014 in [place], [place]. She apparently presented with sudden onset of flank pain and was found to have a right-sided kidney stone as well as a urinary tract infection. She was urgently stented by Dr. Nechama Guard, but by history had an intraoperative myocardial infarction. The patient also suffered postoperative renal insufficiency secondary to sepsis, which was treated eventually with transfer to [facility] in [place]. Her myocardial infarction was treated medically, she did not have stents placed by report. Currently, she is on no antiplatelet therapy. She does have her indwelling stent. She has not been able to have definitive treatment of her urolithiasis up to this point. Dr. Angelina Sheriff is her cardiologist. She has seen Dr. Eliott Nine while in the hospital for her perioperative myocardial infarction. By history, her creatinine has improved down to 1.4-1.6.  By history, she has had quite a few urinary infections since the stent was placed. 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     Past Medical History  Diagnosis Date  . CAD (coronary artery disease)   . PVD (peripheral vascular disease)   . CVA (cerebral infarction)     TIAx2  . HTN (hypertension)   . HLD (hyperlipidemia)   . Tobacco abuse   . Diverticulitis 08/04/12  . S/P partial lobectomy of lung 1997    per [facility], incidental finding, path c/w benign lesion  . Stroke   . COPD (chronic obstructive pulmonary disease)   . Shortness of breath   . Fibromyalgia   . PONV (postoperative nausea and vomiting)   . Myocardial infarction     07/2012   . Anxiety   . Diabetes mellitus without complication     on no meds   . GERD (gastroesophageal reflux disease)     Past Surgical History  Procedure Laterality Date  . Hysterectomoy    . Lumbar spine surgery    . Carpal tunnel release    . Coronary angioplasty    . Coronary stents     . Partial lobectomy of lung     . Abdominal hysterectomy    . Back surgery      Home Medications:  No prescriptions prior to admission    Allergies:  Allergies  Allergen Reactions  . Penicillins Rash  . Sulfa Antibiotics Rash    No family history on file.  Social History:  reports that she has been smoking Cigarettes.  She has a 12.25 pack-year smoking history. She has never used smokeless tobacco. She reports that she does not drink alcohol or use illicit drugs.  ROS: A complete review of systems was performed.  All systems are negative except for pertinent findings as noted. Recent nausea and weakness attributed to bradycardia secondary to beta blockers.  Physical Exam:  Vital signs in last 24 hours:   General:  Alert and oriented, No acute distress HEENT: Normocephalic, atraumatic Neck: No JVD or lymphadenopathy Cardiovascular: Regular rate and rhythm Lungs: Clear bilaterally Abdomen: Soft, nontender, nondistended, no abdominal masses Back: No CVA tenderness Extremities: No edema Neurologic: Grossly intact  Laboratory Data:  No results found for this or  any previous visit (from the past 24 hour(s)). No results found for this or any previous visit (from the past 240 hour(s)). Creatinine:  Recent Labs  12/20/12 1515  CREATININE 1.62*    Radiologic Imaging: No results found.  Impression/Assessment:  Large right renal stone  Plan:  Right PCNL  Chelsea Aus 12/23/2012, 8:02 PM  Bertram Millard. Valerio Pinard MD

## 2012-12-24 ENCOUNTER — Other Ambulatory Visit: Payer: Self-pay | Admitting: Urology

## 2012-12-24 ENCOUNTER — Encounter (HOSPITAL_COMMUNITY): Payer: Self-pay

## 2012-12-24 ENCOUNTER — Ambulatory Visit (HOSPITAL_COMMUNITY)
Admission: RE | Admit: 2012-12-24 | Discharge: 2012-12-24 | Disposition: A | Discharge status: Home / Self Care | Payer: Medicare Other | Source: Ambulatory Visit | Attending: Urology | Admitting: Urology

## 2012-12-24 ENCOUNTER — Observation Stay (HOSPITAL_COMMUNITY): Payer: Medicare Other

## 2012-12-24 ENCOUNTER — Encounter (HOSPITAL_COMMUNITY): Payer: Self-pay | Admitting: Anesthesiology

## 2012-12-24 ENCOUNTER — Ambulatory Visit (HOSPITAL_COMMUNITY): Payer: Medicare Other | Admitting: Anesthesiology

## 2012-12-24 ENCOUNTER — Observation Stay (HOSPITAL_COMMUNITY)
Admission: RE | Admit: 2012-12-24 | Discharge: 2012-12-25 | Disposition: A | Discharge status: Home / Self Care | Payer: Medicare Other | Source: Ambulatory Visit | Attending: Urology | Admitting: Urology

## 2012-12-24 ENCOUNTER — Encounter (HOSPITAL_COMMUNITY)
Admission: RE | Disposition: A | Discharge status: Home / Self Care | Payer: Self-pay | Source: Ambulatory Visit | Attending: Urology

## 2012-12-24 DIAGNOSIS — N289 Disorder of kidney and ureter, unspecified: Secondary | ICD-10-CM | POA: Insufficient documentation

## 2012-12-24 DIAGNOSIS — Z9861 Coronary angioplasty status: Secondary | ICD-10-CM | POA: Insufficient documentation

## 2012-12-24 DIAGNOSIS — E785 Hyperlipidemia, unspecified: Secondary | ICD-10-CM | POA: Insufficient documentation

## 2012-12-24 DIAGNOSIS — E119 Type 2 diabetes mellitus without complications: Secondary | ICD-10-CM | POA: Insufficient documentation

## 2012-12-24 DIAGNOSIS — N200 Calculus of kidney: Secondary | ICD-10-CM

## 2012-12-24 DIAGNOSIS — K219 Gastro-esophageal reflux disease without esophagitis: Secondary | ICD-10-CM | POA: Insufficient documentation

## 2012-12-24 DIAGNOSIS — R110 Nausea: Secondary | ICD-10-CM | POA: Insufficient documentation

## 2012-12-24 DIAGNOSIS — I10 Essential (primary) hypertension: Secondary | ICD-10-CM | POA: Insufficient documentation

## 2012-12-24 DIAGNOSIS — I739 Peripheral vascular disease, unspecified: Secondary | ICD-10-CM | POA: Insufficient documentation

## 2012-12-24 DIAGNOSIS — Z902 Acquired absence of lung [part of]: Secondary | ICD-10-CM | POA: Insufficient documentation

## 2012-12-24 DIAGNOSIS — J438 Other emphysema: Secondary | ICD-10-CM | POA: Insufficient documentation

## 2012-12-24 DIAGNOSIS — Z8673 Personal history of transient ischemic attack (TIA), and cerebral infarction without residual deficits: Secondary | ICD-10-CM | POA: Insufficient documentation

## 2012-12-24 DIAGNOSIS — I2510 Atherosclerotic heart disease of native coronary artery without angina pectoris: Secondary | ICD-10-CM | POA: Insufficient documentation

## 2012-12-24 DIAGNOSIS — J9589 Other postprocedural complications and disorders of respiratory system, not elsewhere classified: Secondary | ICD-10-CM | POA: Insufficient documentation

## 2012-12-24 DIAGNOSIS — IMO0001 Reserved for inherently not codable concepts without codable children: Secondary | ICD-10-CM | POA: Insufficient documentation

## 2012-12-24 DIAGNOSIS — Z79899 Other long term (current) drug therapy: Secondary | ICD-10-CM | POA: Insufficient documentation

## 2012-12-24 DIAGNOSIS — I252 Old myocardial infarction: Secondary | ICD-10-CM | POA: Insufficient documentation

## 2012-12-24 HISTORY — PX: NEPHROLITHOTOMY: SHX5134

## 2012-12-24 LAB — TYPE AND SCREEN
ABO/RH(D): A NEG
Antibody Screen: NEGATIVE

## 2012-12-24 LAB — GLUCOSE, CAPILLARY
Glucose-Capillary: 83 mg/dL (ref 70–99)
Glucose-Capillary: 108 mg/dL — ABNORMAL HIGH (ref 70–99)

## 2012-12-24 LAB — HEMOGLOBIN AND HEMATOCRIT, BLOOD: HCT: 36.9 % (ref 36.0–46.0)

## 2012-12-24 SURGERY — NEPHROLITHOTOMY PERCUTANEOUS
Anesthesia: General | Laterality: Right | Wound class: Clean Contaminated

## 2012-12-24 MED ORDER — ONDANSETRON HCL 4 MG/2ML IJ SOLN: 4.0000 mg | INTRAMUSCULAR | Status: DC | PRN

## 2012-12-24 MED ORDER — FENTANYL CITRATE 0.05 MG/ML IJ SOLN
INTRAMUSCULAR | Status: AC | PRN
  Administered 2012-12-24 (×2): 50 ug via INTRAVENOUS
  Administered 2012-12-24: 25 ug via INTRAVENOUS
  Administered 2012-12-24: 50 ug via INTRAVENOUS
  Administered 2012-12-24: 25 ug via INTRAVENOUS

## 2012-12-24 MED ORDER — MIDAZOLAM HCL 2 MG/2ML IJ SOLN
INTRAMUSCULAR | Status: AC
  Filled 2012-12-24: qty 6

## 2012-12-24 MED ORDER — ROCURONIUM BROMIDE 100 MG/10ML IV SOLN
INTRAVENOUS | Status: DC | PRN
  Administered 2012-12-24: 30 mg via INTRAVENOUS

## 2012-12-24 MED ORDER — LOSARTAN POTASSIUM 50 MG PO TABS
100.0000 mg | ORAL_TABLET | Freq: Every day | ORAL | Status: DC
  Administered 2012-12-24 – 2012-12-25 (×2): 100 mg via ORAL
  Filled 2012-12-24 (×2): qty 2

## 2012-12-24 MED ORDER — PNEUMOCOCCAL VAC POLYVALENT 25 MCG/0.5ML IJ INJ
0.5000 mL | INJECTION | INTRAMUSCULAR | Status: DC
  Filled 2012-12-24 (×2): qty 0.5

## 2012-12-24 MED ORDER — SODIUM CHLORIDE 0.9 % IV BOLUS (SEPSIS)
500.0000 mL | Freq: Once | INTRAVENOUS | Status: AC
  Administered 2012-12-24: 250 mL via INTRAVENOUS

## 2012-12-24 MED ORDER — GLYCOPYRROLATE 0.2 MG/ML IJ SOLN
INTRAMUSCULAR | Status: DC | PRN
  Administered 2012-12-24: 0.6 mg via INTRAVENOUS

## 2012-12-24 MED ORDER — ZOLPIDEM TARTRATE 5 MG PO TABS: 5.0000 mg | ORAL_TABLET | Freq: Every evening | ORAL | Status: DC | PRN

## 2012-12-24 MED ORDER — PROPOFOL 10 MG/ML IV BOLUS
INTRAVENOUS | Status: DC | PRN
  Administered 2012-12-24: 120 mg via INTRAVENOUS

## 2012-12-24 MED ORDER — IOHEXOL 300 MG/ML  SOLN
INTRAMUSCULAR | Status: AC
  Filled 2012-12-24: qty 2

## 2012-12-24 MED ORDER — LABETALOL HCL 5 MG/ML IV SOLN
INTRAVENOUS | Status: AC
  Filled 2012-12-24: qty 4

## 2012-12-24 MED ORDER — SODIUM CHLORIDE 0.9 % IR SOLN
Status: DC | PRN
  Administered 2012-12-24: 6000 mL

## 2012-12-24 MED ORDER — HYDROMORPHONE HCL PF 1 MG/ML IJ SOLN: 0.5000 mg | INTRAMUSCULAR | Status: DC | PRN

## 2012-12-24 MED ORDER — IOHEXOL 300 MG/ML  SOLN
10.0000 mL | Freq: Once | INTRAMUSCULAR | Status: AC | PRN
  Administered 2012-12-24: 40 mL

## 2012-12-24 MED ORDER — CIPROFLOXACIN IN D5W 400 MG/200ML IV SOLN
INTRAVENOUS | Status: AC
  Filled 2012-12-24: qty 200

## 2012-12-24 MED ORDER — FENOFIBRATE 160 MG PO TABS
160.0000 mg | ORAL_TABLET | Freq: Every morning | ORAL | Status: DC
  Administered 2012-12-25: 160 mg via ORAL
  Filled 2012-12-24: qty 1

## 2012-12-24 MED ORDER — KETOROLAC TROMETHAMINE 15 MG/ML IJ SOLN
15.0000 mg | Freq: Four times a day (QID) | INTRAMUSCULAR | Status: DC
  Administered 2012-12-24 – 2012-12-25 (×3): 15 mg via INTRAVENOUS
  Filled 2012-12-24 (×7): qty 1

## 2012-12-24 MED ORDER — LACTATED RINGERS IV SOLN: INTRAVENOUS | Status: DC

## 2012-12-24 MED ORDER — DIAZEPAM 5 MG PO TABS: 10.0000 mg | ORAL_TABLET | Freq: Four times a day (QID) | ORAL | Status: DC | PRN

## 2012-12-24 MED ORDER — HYDROMORPHONE HCL PF 1 MG/ML IJ SOLN: 0.2500 mg | INTRAMUSCULAR | Status: DC | PRN

## 2012-12-24 MED ORDER — DOCUSATE SODIUM 100 MG PO CAPS
100.0000 mg | ORAL_CAPSULE | Freq: Two times a day (BID) | ORAL | Status: DC
  Administered 2012-12-24 – 2012-12-25 (×2): 100 mg via ORAL
  Filled 2012-12-24 (×3): qty 1

## 2012-12-24 MED ORDER — FENTANYL CITRATE 0.05 MG/ML IJ SOLN
INTRAMUSCULAR | Status: AC
  Filled 2012-12-24: qty 6

## 2012-12-24 MED ORDER — SIMVASTATIN 5 MG PO TABS
5.0000 mg | ORAL_TABLET | Freq: Every day | ORAL | Status: DC
  Administered 2012-12-24: 5 mg via ORAL
  Filled 2012-12-24 (×2): qty 1

## 2012-12-24 MED ORDER — MIDAZOLAM HCL 2 MG/2ML IJ SOLN
INTRAMUSCULAR | Status: AC | PRN
  Administered 2012-12-24 (×2): 0.5 mg via INTRAVENOUS
  Administered 2012-12-24 (×3): 1 mg via INTRAVENOUS

## 2012-12-24 MED ORDER — ONDANSETRON HCL 4 MG/2ML IJ SOLN
INTRAMUSCULAR | Status: AC
  Filled 2012-12-24: qty 2

## 2012-12-24 MED ORDER — NEOSTIGMINE METHYLSULFATE 1 MG/ML IJ SOLN
INTRAMUSCULAR | Status: DC | PRN
  Administered 2012-12-24: 4 mg via INTRAVENOUS

## 2012-12-24 MED ORDER — SUCCINYLCHOLINE CHLORIDE 20 MG/ML IJ SOLN
INTRAMUSCULAR | Status: DC | PRN
  Administered 2012-12-24: 100 mg via INTRAVENOUS

## 2012-12-24 MED ORDER — SODIUM CHLORIDE 0.9 % IV SOLN
INTRAVENOUS | Status: DC
  Administered 2012-12-24: 11:00:00 via INTRAVENOUS
  Administered 2012-12-24: 20 mL/h via INTRAVENOUS
  Administered 2012-12-24: 13:00:00 via INTRAVENOUS

## 2012-12-24 MED ORDER — LABETALOL HCL 5 MG/ML IV SOLN
10.0000 mg | Freq: Once | INTRAVENOUS | Status: AC
  Administered 2012-12-24: 10 mg via INTRAVENOUS

## 2012-12-24 MED ORDER — FENTANYL CITRATE 0.05 MG/ML IJ SOLN
INTRAMUSCULAR | Status: DC | PRN
  Administered 2012-12-24 (×2): 50 ug via INTRAVENOUS
  Administered 2012-12-24: 100 ug via INTRAVENOUS

## 2012-12-24 MED ORDER — LIDOCAINE HCL (PF) 2 % IJ SOLN
INTRAMUSCULAR | Status: DC | PRN
  Administered 2012-12-24: 75 mg

## 2012-12-24 MED ORDER — BUDESONIDE-FORMOTEROL FUMARATE 160-4.5 MCG/ACT IN AERO
2.0000 | INHALATION_SPRAY | Freq: Two times a day (BID) | RESPIRATORY_TRACT | Status: DC
  Administered 2012-12-24 – 2012-12-25 (×2): 2 via RESPIRATORY_TRACT
  Filled 2012-12-24: qty 6

## 2012-12-24 MED ORDER — CIPROFLOXACIN IN D5W 400 MG/200ML IV SOLN
400.0000 mg | Freq: Once | INTRAVENOUS | Status: AC
  Administered 2012-12-24: 400 mg via INTRAVENOUS

## 2012-12-24 MED ORDER — IOHEXOL 300 MG/ML  SOLN
INTRAMUSCULAR | Status: DC | PRN
  Administered 2012-12-24: 25 mL

## 2012-12-24 MED ORDER — CIPROFLOXACIN HCL 500 MG PO TABS
500.0000 mg | ORAL_TABLET | Freq: Two times a day (BID) | ORAL | Status: DC
  Administered 2012-12-24 – 2012-12-25 (×2): 500 mg via ORAL
  Filled 2012-12-24 (×4): qty 1

## 2012-12-24 MED ORDER — PANTOPRAZOLE SODIUM 40 MG PO TBEC
40.0000 mg | DELAYED_RELEASE_TABLET | Freq: Every day | ORAL | Status: DC
  Administered 2012-12-24 – 2012-12-25 (×2): 40 mg via ORAL
  Filled 2012-12-24 (×2): qty 1

## 2012-12-24 MED ORDER — AMLODIPINE BESYLATE 2.5 MG PO TABS
2.5000 mg | ORAL_TABLET | Freq: Every morning | ORAL | Status: DC
  Administered 2012-12-25: 2.5 mg via ORAL
  Filled 2012-12-24: qty 1

## 2012-12-24 MED ORDER — ONDANSETRON HCL 4 MG/2ML IJ SOLN
INTRAMUSCULAR | Status: AC | PRN
  Administered 2012-12-24: 4 mg via INTRAVENOUS

## 2012-12-24 MED ORDER — OXYCODONE-ACETAMINOPHEN 5-325 MG PO TABS
1.0000 | ORAL_TABLET | ORAL | Status: DC | PRN
  Administered 2012-12-24 – 2012-12-25 (×2): 2 via ORAL
  Filled 2012-12-24 (×2): qty 2

## 2012-12-24 MED ORDER — LIDOCAINE HCL 1 % IJ SOLN
INTRAMUSCULAR | Status: AC
  Filled 2012-12-24: qty 20

## 2012-12-24 MED ORDER — POTASSIUM CHLORIDE IN NACL 20-0.45 MEQ/L-% IV SOLN
INTRAVENOUS | Status: DC
  Administered 2012-12-24: 21:00:00 via INTRAVENOUS
  Administered 2012-12-24: 75 mL/h via INTRAVENOUS
  Filled 2012-12-24 (×4): qty 1000

## 2012-12-24 MED ORDER — OXYCODONE-ACETAMINOPHEN 5-325 MG PO TABS: 1.0000 | ORAL_TABLET | ORAL | Status: DC | PRN

## 2012-12-24 SURGICAL SUPPLY — 47 items
APL SKNCLS STERI-STRIP NONHPOA (GAUZE/BANDAGES/DRESSINGS) ×1
BAG URINE DRAINAGE (UROLOGICAL SUPPLIES) ×2 IMPLANT
BASKET ZERO TIP NITINOL 2.4FR (BASKET) ×1 IMPLANT
BENZOIN TINCTURE PRP APPL 2/3 (GAUZE/BANDAGES/DRESSINGS) ×3 IMPLANT
BLADE SURG 15 STRL LF DISP TIS (BLADE) ×1 IMPLANT
BLADE SURG 15 STRL SS (BLADE) ×2
BSKT STON RTRVL ZERO TP 2.4FR (BASKET)
CARTRIDGE STONEBREAK CO2 KIDNE (ELECTROSURGICAL) ×1 IMPLANT
CATCHER STONE W/TUBE ADAPTER (UROLOGICAL SUPPLIES) ×1 IMPLANT
CATH FOLEY 2W COUNCIL 20FR 5CC (CATHETERS) IMPLANT
CATH ROBINSON RED A/P 20FR (CATHETERS) IMPLANT
CATH ULTRATHANE 14FR (STENTS) ×1 IMPLANT
CATH X-FORCE N30 NEPHROSTOMY (TUBING) ×2 IMPLANT
CLOTH BEACON ORANGE TIMEOUT ST (SAFETY) ×2 IMPLANT
COVER SURGICAL LIGHT HANDLE (MISCELLANEOUS) ×2 IMPLANT
DRAPE C-ARM 42X120 X-RAY (DRAPES) ×2 IMPLANT
DRAPE CAMERA CLOSED 9X96 (DRAPES) ×2 IMPLANT
DRAPE LINGEMAN PERC (DRAPES) ×2 IMPLANT
DRAPE SURG IRRIG POUCH 19X23 (DRAPES) ×2 IMPLANT
DRSG TEGADERM 8X12 (GAUZE/BANDAGES/DRESSINGS) ×4 IMPLANT
GLOVE BIOGEL M 8.0 STRL (GLOVE) ×5 IMPLANT
GOWN STRL REIN XL XLG (GOWN DISPOSABLE) ×2 IMPLANT
GUIDEWIRE AMPLAZ .035X145 (WIRE) ×2 IMPLANT
KIT BASIN OR (CUSTOM PROCEDURE TRAY) ×2 IMPLANT
LASER FIBER DISP (UROLOGICAL SUPPLIES) IMPLANT
LASER FIBER DISP 1000U (UROLOGICAL SUPPLIES) IMPLANT
MANIFOLD NEPTUNE II (INSTRUMENTS) ×2 IMPLANT
NS IRRIG 1000ML POUR BTL (IV SOLUTION) ×2 IMPLANT
PACK BASIC VI WITH GOWN DISP (CUSTOM PROCEDURE TRAY) ×2 IMPLANT
PAD ABD 7.5X8 STRL (GAUZE/BANDAGES/DRESSINGS) ×4 IMPLANT
PROBE KIDNEY STONEBRKR 2.0X425 (ELECTROSURGICAL) ×1 IMPLANT
PROBE LITHOCLAST ULTRA 3.8X403 (UROLOGICAL SUPPLIES) IMPLANT
PROBE PNEUMATIC 1.0MMX570MM (UROLOGICAL SUPPLIES) ×1 IMPLANT
SET IRRIG Y TYPE TUR BLADDER L (SET/KITS/TRAYS/PACK) ×1 IMPLANT
SET WARMING FLUID IRRIGATION (MISCELLANEOUS) ×2 IMPLANT
SHEATH PEELAWAY SET 9 (SHEATH) ×1 IMPLANT
SPONGE GAUZE 4X4 12PLY (GAUZE/BANDAGES/DRESSINGS) ×2 IMPLANT
SPONGE LAP 4X18 X RAY DECT (DISPOSABLE) ×2 IMPLANT
STONE CATCHER W/TUBE ADAPTER (UROLOGICAL SUPPLIES) IMPLANT
SUT SILK 2 0 30  PSL (SUTURE) ×1
SUT SILK 2 0 30 PSL (SUTURE) ×1 IMPLANT
SYR 20CC LL (SYRINGE) ×3 IMPLANT
SYRINGE 10CC LL (SYRINGE) ×2 IMPLANT
TRAY FOLEY BAG SILVER LF 14FR (CATHETERS) ×1 IMPLANT
TRAY FOLEY CATH 14FRSI W/METER (CATHETERS) ×2 IMPLANT
TUBING CONNECTING 10 (TUBING) ×5 IMPLANT
WATER STERILE IRR 1500ML POUR (IV SOLUTION) ×1 IMPLANT

## 2012-12-24 NOTE — Progress Notes (Signed)
Patient did not tolerate extubation and became hypercarbic and non-responsive.  Reintubated and with ventilation the patient woke up.  Will leave on a ventilator and try to wean.  Ronelle Nigh MD

## 2012-12-24 NOTE — Progress Notes (Signed)
Anesthesiology PACU Note  Extubation  Hypercapnic respiratory failure in pacu due to severe COPD/emphysema. Rapid wean of ventilator support in PACU. Pt quickly responsive to verbal stimuli after intubation once PaCO2 decreased with adequate minute ventilation. Pt SpO2 100% and VT 250-350 on 0 PS and 5 of PEEP at 30%. Pt briskly responds to verbal commands and nods appropriately to questions. Sustained head lift. Extubated to FM without issue. Continue ET CO2 in PACU.

## 2012-12-24 NOTE — Progress Notes (Signed)
Pt received from OR, arousable, sats 100%. Rapidly deteriorated to unresponsive and apnic.  Dr Renold Don at bedside [phone]. Pt receiving ambu ventilation by Eliot Ford CRNA.   VSS as documented.  Nasal airway placed and pt continued with poor resp effort(being bagged).  Dr Leta Jungling at  bedside. Pt intubated at 1315. Received labetolol 10 mg IV for Blood pressure.  VSS stabilized and weaned to PS and 40% oxygen at 1340. Pt arouses and appears comfortable.  Follows commands.  Dr Vennie Homans and [place] frequently at bedside.

## 2012-12-24 NOTE — Anesthesia Preprocedure Evaluation (Addendum)
Anesthesia Evaluation  Patient identified by MRN, date of birth, ID band Patient awake    Reviewed: Allergy & Precautions, H&P , NPO status , Patient's Chart, lab work & pertinent test results, reviewed documented beta blocker date and time   History of Anesthesia Complications (+) PONV  Airway Mallampati: II TM Distance: >3 FB Neck ROM: full    Dental  (+) Edentulous Upper, Missing and Dental Advisory Given Missing several lower front teeth too.:   Pulmonary shortness of breath and with exertion, COPDCurrent Smoker,  Severe COPD but no acute distress breath sounds clear to auscultation  Pulmonary exam normal       Cardiovascular Exercise Tolerance: Poor hypertension, Pt. on home beta blockers and Pt. on medications + CAD and + Past MI Rhythm:regular Rate:Normal  MI (NSTEMI) 4/14 medically managed.  Denies CP now.     Neuro/Psych Anxiety TIACVA, No Residual Symptoms negative psych ROS   GI/Hepatic negative GI ROS, Neg liver ROS, GERD-  Medicated and Controlled,  Endo/Other  diabetes, Well Controlled, Type 2Diet controlled DM  Renal/GU negative Renal ROSHistory acute renal failure - resolved  BUN and CR elevated but not dangerously.  negative genitourinary   Musculoskeletal   Abdominal   Peds  Hematology negative hematology ROS (+)   Anesthesia Other Findings   Reproductive/Obstetrics negative OB ROS                         Anesthesia Physical Anesthesia Plan  ASA: IV  Anesthesia Plan: General   Post-op Pain Management:    Induction: Intravenous  Airway Management Planned: Oral ETT  Additional Equipment:   Intra-op Plan:   Post-operative Plan: Extubation in OR  Informed Consent: I have reviewed the patients History and Physical, chart, labs and discussed the procedure including the risks, benefits and alternatives for the proposed anesthesia with the patient or authorized  representative who has indicated his/her understanding and acceptance.   Dental Advisory Given  Plan Discussed with: CRNA and Surgeon  Anesthesia Plan Comments:        Anesthesia Quick Evaluation

## 2012-12-24 NOTE — Op Note (Signed)
Preoperative diagnosis: 13 mm right renal calculus with smaller nearby calculus  Postoperative diagnosis: Same   Procedure: Percutaneous nephrolithotomy, antegrade nephrostogram    Surgeon: Bertram Millard. Ashey Tramontana, M.D.   Anesthesia: Gen.   Complications: None  Specimen(s): Stone fragments, the family  Drain(s): 16 French Foley catheter per urethra, 14 French pigtail catheter in right renal pelvis  Indications: 62 year old female who presents at this time for microscopic evaluation/management of asymptomatic right renal stone that has been stented. She presented in April with significant pain and sepsis. A stent was placed emergently in [place], [place] by Dr. Nechama Guard. Because of cardiac issues, she was transferred regarding her care to me. She presents at this time, following adequate cardiology clearance, for placement of a percutaneous nephrostomy access by Dr. Fredia Sorrow to be followed by percutaneous nephrostolithotomy by me. The patient is aware of the procedure as well as risks and complications. She understands these and desires to proceed.    Technique and findings: The patient was properly identified in the holding area. She was marked correctly. She had received Cipro in the radiology suite and received Jenamicin in the holding area. She was then taken to the operating room where general anesthetic was administered with endotracheal apparatus. 16 French Foley catheter was placed transurethrally. She was then flipped to the prone position, all extremities and pressure points were padded appropriately. Her right flank was then prepped and draped around the nephrostomy tube. Proper timeout was then performed.  Through the Kumpe catheter that had been positioned in the patient's bladder through the ureter, I passed a guidewire using fluoroscopic guidance. The Kumpe catheter was removed, and a second safety wire was placed alongside the primary wire using a peel-away access sheath. The skin  was then incised, and once the access sheath was removed, over the working wire nephrostomy tract was dilated with a NephroMax balloon to 18 atmospheres of pressure. I then placed a 28 French nephrostomy access sheath over top of the balloon to the renal pelvis using fluoroscopic guidance. The balloon was then removed. The nephroscope was in place. It inspected the renal pelvis. No stone was seen, a previously placed stent was noted, and eventually removed. I negotiated the nephroscope into a lower pole infundibulum, where the large stone was identified and fragmented using the stone breaker. The fragments were then totally extracted, including tiny fragments. These were saved. There was a free-floating 5-6 mm stone within the renal pelvis dated washed in this area, this was extracted as well. Using fluoroscopy and the scope is guidance, no further stones were seen. I then removed the double-J stent, and the access sheath. Over the working wire, I placed a 14 French pigtail catheter, and injecting, positioned this within the renal pelvis, where the tip was curled and 6. A nephrostogram was then performed. There was minimal extravasation within the kidney, and free egress of contrast down to the ureter. At this point I sutured the skin using 2-0 silk placed in a vertical mattress fashion in 2 separate areas, and fixed the pigtail catheter to the skin. Dry sterile dressings were placed. The pigtail catheter was hooked to dependent drainage. The patient was then awakened the PACU in stable condition. She tolerated the procedure well.

## 2012-12-24 NOTE — Preoperative (Signed)
Beta Blockers   Reason not to administer Beta Blockers:Took Metoprolol this am. 

## 2012-12-24 NOTE — H&P (Signed)
Alexa Key is an 62 y.o. female.   Chief Complaint: kidney stones HPI: Patient with history of right renal calculi presents today for placement of a right percutaneous nephrostomy prior to nephrolithotomy.  Past Medical History  Diagnosis Date  . CAD (coronary artery disease)   . PVD (peripheral vascular disease)   . CVA (cerebral infarction)     TIAx2  . HTN (hypertension)   . HLD (hyperlipidemia)   . Tobacco abuse   . Diverticulitis 08/04/12  . S/P partial lobectomy of lung 1997    per [facility], incidental finding, path c/w benign lesion  . Stroke   . COPD (chronic obstructive pulmonary disease)   . Shortness of breath   . Fibromyalgia   . PONV (postoperative nausea and vomiting)   . Myocardial infarction     07/2012   . Anxiety   . Diabetes mellitus without complication     on no meds   . GERD (gastroesophageal reflux disease)     Past Surgical History  Procedure Laterality Date  . Hysterectomoy    . Lumbar spine surgery    . Carpal tunnel release    . Coronary angioplasty    . Coronary stents     . Partial lobectomy of lung     . Abdominal hysterectomy    . Back surgery      History reviewed. No pertinent family history. Social History:  reports that she has been smoking Cigarettes.  She has a 12.25 pack-year smoking history. She has never used smokeless tobacco. She reports that she does not drink alcohol or use illicit drugs.  Allergies:  Allergies  Allergen Reactions  . Penicillins Rash  . Sulfa Antibiotics Rash    Current outpatient prescriptions:amLODipine (NORVASC) 2.5 MG tablet, Take 2.5 mg by mouth every morning., Disp: , Rfl: ;  aspirin 325 MG tablet, Take 325 mg by mouth daily., Disp: , Rfl: ;  budesonide-formoterol (SYMBICORT) 160-4.5 MCG/ACT inhaler, Inhale 2 puffs into the lungs 2 (two) times daily., Disp: 1 Inhaler, Rfl: 12;  cholecalciferol (VITAMIN D) 1000 UNITS tablet, Take 2,000 Units by mouth at bedtime. , Disp: , Rfl:  diazepam (VALIUM) 10 MG  tablet, Take 10 mg by mouth as needed (for upcoming surgery). , Disp: , Rfl: ;  doxycycline (VIBRA-TABS) 100 MG tablet, Take 100 mg by mouth daily., Disp: , Rfl: ;  fenofibrate 160 MG tablet, Take 160 mg by mouth every morning., Disp: , Rfl: ;  losartan (COZAAR) 100 MG tablet, Take 100 mg by mouth daily., Disp: , Rfl:  metoprolol tartrate (LOPRESSOR) 12.5 mg TABS tablet, Take 0.5 tablets (12.5 mg total) by mouth 2 (two) times daily. Start 12/22/2012 if heart rate comes up to 60/minute., Disp: 30 tablet, Rfl: 0;  omeprazole (PRILOSEC) 20 MG capsule, Take 20 mg by mouth daily., Disp: , Rfl: ;  pravastatin (PRAVACHOL) 40 MG tablet, Take 40 mg by mouth at bedtime. , Disp: , Rfl:  acetaminophen (TYLENOL) 500 MG tablet, Take 1,000 mg by mouth every 6 (six) hours as needed for pain., Disp: , Rfl:  Current facility-administered medications:0.9 %  sodium chloride infusion, , Intravenous, Continuous, Brayton El, PA-C, Last Rate: 20 mL/hr at 12/24/12 0728, 20 mL/hr at 12/24/12 4540;  ciprofloxacin (CIPRO) IVPB 400 mg, 400 mg, Intravenous, Once, Brayton El, PA-C Facility-Administered Medications Ordered in Other Encounters: gentamicin (GARAMYCIN) 280 mg in dextrose 5 % 100 mL IVPB, 280 mg, Intravenous, 60 min Pre-Op, Berkley Harvey, [facility]   Results for orders placed during the  hospital encounter of 12/24/12 (from the past 48 hour(s))  GLUCOSE, CAPILLARY     Status: None   Collection Time    12/24/12  7:26 AM      Result Value Range   Glucose-Capillary 83  70 - 99 mg/dL   Comment 1 Notify RN     No results found.  Review of Systems  Constitutional: Negative for fever and chills.  Respiratory: Negative for cough and shortness of breath.   Cardiovascular: Negative for chest pain.  Gastrointestinal: Negative for nausea, vomiting and abdominal pain.  Genitourinary: Negative for dysuria.       Intermittent hematuria  Musculoskeletal:       Occ low back pain  Neurological: Negative for headaches.     Blood pressure 173/86, pulse 50, temperature 97.8 F (36.6 C), temperature source Oral, resp. rate 20, height 5\' 1"  (1.549 m), weight 124 lb (56.246 kg), SpO2 100.00%. Physical Exam  Constitutional: She is oriented to person, place, and time. She appears well-developed and well-nourished.  Cardiovascular:  Bradycardic but reg rhythm  Respiratory: Effort normal.  Dim BS on right , clear on left  GI: Soft. Bowel sounds are normal. There is no tenderness.  Musculoskeletal: Normal range of motion. She exhibits no edema.  Neurological: She is alert and oriented to person, place, and time.     Assessment/Plan Pt with hx of right renal calculi. Plan is for right percutaneous nephrostomy today prior to nephrolithotomy. Details/risks of procedure d/w pt/family with their understanding and consent.  Jaylee Freeze,D KEVIN 12/24/2012, 8:50 AM

## 2012-12-24 NOTE — Transfer of Care (Signed)
Immediate Anesthesia Transfer of Care Note  Patient: Alexa Key  Procedure(s) Performed: Procedure(s): NEPHROLITHOTOMY PERCUTANEOUS (Right)  Patient Location: PACU  Anesthesia Type:General  Level of Consciousness: obtunded  Airway & Oxygen Therapy: Patient re-intubated, Patient placed on Ventilator (see vital sign flow sheet for setting) and patient initially 100% sat and would open eyes to name but then sat decreased as resp effort diminished.  Post-op Assessment: Report given to PACU RN and Labetalol given by PACU nurse per MDA's order.  Post vital signs: Reviewed  Complications: Patient re-intubated

## 2012-12-24 NOTE — Procedures (Signed)
Procedure:  Right percutaneous renal access with ureteral catheter placement Findings:  Irregular calculus impacted in lower pole infundibulum.  After access of LP calyx, could not advance a guidewire beyond the calculus.   Midpole access accomplished with 5 Fr catheter advanced into the ureter and bladder for access during percutaneous nephrolithotomy to follow.

## 2012-12-24 NOTE — Anesthesia Postprocedure Evaluation (Signed)
  Anesthesia Post-op Note  Patient: Alexa Key  Procedure(s) Performed: Procedure(s) (LRB): NEPHROLITHOTOMY PERCUTANEOUS (Right)  Patient Location: PACU  Anesthesia Type: General  Level of Consciousness: awake and alert   Airway and Oxygen Therapy: Patient Spontanous Breathing  Post-op Pain: mild  Post-op Assessment: Post-op Vital signs reviewed, Patient's Cardiovascular Status Stable, Respiratory Function Stable, Patent Airway and No signs of Nausea or vomiting  Last Vitals:  Filed Vitals:   12/24/12 1430  BP: 146/61  Pulse: 65  Temp: 36.4 C  Resp: 18    Post-op Vital Signs: stable   Complications: Patient reintubated in PACU but successfully weaned and sent to floor with pulse oximeter.  No complications noted on discharge.

## 2012-12-24 NOTE — Progress Notes (Signed)
Post-op note  Subjective: The patient has some nausea and dry heaves. She is not having severe pain.  Objective: Vital signs in last 24 hours: Temp:  [97.4 F (36.3 C)-97.8 F (36.6 C)] 97.4 F (36.3 C) (08/25 1533) Pulse Rate:  [50-103] 57 (08/25 1533) Resp:  [0-24] 20 (08/25 1533) BP: (115-206)/(45-159) 142/58 mmHg (08/25 1533) SpO2:  [33 %-100 %] 100 % (08/25 1533) FiO2 (%):  [40 %-60 %] 40 % (08/25 1400) Weight:  [56.24 kg (123 lb 15.8 oz)-56.246 kg (124 lb)] 56.24 kg (123 lb 15.8 oz) (08/25 1533)  Intake/Output from previous day:   Intake/Output this shift: Total I/O In: 300 [I.V.:300] Out: 50 [Urine:50]  Physical Exam:  General: Alert and oriented. Abdomen: Soft, Nondistended.  Urine is fairly clear  Lab Results:  Recent Labs  12/24/12 1445  HGB 11.7*  HCT 36.9    Assessment/Plan: POD#0  Urine volume is somewhat low. Hemoglobin is normal for her. 1) Continue to monitor   Bertram Millard. Meggin Ola, MD   LOS: 0 days   Chelsea Aus 12/24/2012, 6:16 PM

## 2012-12-24 NOTE — H&P (Signed)
Agree 

## 2012-12-25 ENCOUNTER — Ambulatory Visit: Payer: Medicare Other | Admitting: Family Medicine

## 2012-12-25 ENCOUNTER — Encounter (HOSPITAL_COMMUNITY): Payer: Self-pay | Admitting: Urology

## 2012-12-25 LAB — HEMOGLOBIN AND HEMATOCRIT, BLOOD: Hemoglobin: 9.5 g/dL — ABNORMAL LOW (ref 12.0–15.0)

## 2012-12-25 MED ORDER — SODIUM CHLORIDE 0.9 % IV BOLUS (SEPSIS)
500.0000 mL | Freq: Once | INTRAVENOUS | Status: AC
  Administered 2012-12-25: 500 mL via INTRAVENOUS

## 2012-12-25 NOTE — Progress Notes (Signed)
The patient's foley output for the last 6 hours was 30ml.The output for the nephrostomy tube was 5 ml. The vitals were: 134/49,HR 46,RR 18, 97.29F,100% 2 L [place]. The PCP on call was notified. Orders were given to bolus 500 ml of Normal Saline and the rate of the continuous fluids(0.45% with 20 mEq) to be increased to .  After the fluids were given. The dressing on the nephrostomy tube was saturated with fluid and there was a tube that was disconnected in the nephrostomy tube.Once the tube was reconnected and the dressing was changed. The fluid output began to increase both in the foley and the nephrostomy tube.  Will continue to monitor the patient.

## 2012-12-25 NOTE — Care Management Note (Addendum)
    Page 1 of 1   12/26/2012     11:19:42 AM   CARE MANAGEMENT NOTE 12/26/2012  Patient:  Alexa Key, Alexa Key   Account Number:  [id number]  Date Initiated:  12/25/2012  Documentation initiated by:  Lanier Clam  Subjective/Objective Assessment:   62 Y/O F ADMITTED W/R RENALCALCULUS.     Action/Plan:   FROM HOME.HAS PCP,PHARMACY.   Anticipated DC Date:  12/25/2012   Anticipated DC Plan:  HOME/SELF CARE      DC Planning Services  CM consult      Choice offered to / List presented to:             Status of service:  Completed, signed off Medicare Important Message given?   (If response is "NO", the following Medicare IM given date fields will be blank) Date Medicare IM given:   Date Additional Medicare IM given:    Discharge Disposition:  HOME/SELF CARE  Per UR Regulation:  Reviewed for med. necessity/level of care/duration of stay  If discussed at Long Length of Stay Meetings, dates discussed:    Comments:  12/25/12 Antania Hoefling RN,BSN NCM [phone] POD#1 R PCN.

## 2012-12-25 NOTE — Progress Notes (Signed)
1 Day Post-Op Subjective: Patient reports no pain.  She is not having nausea.  Objective: Vital signs in last 24 hours: Temp:  [97.4 F (36.3 C)-97.8 F (36.6 C)] 97.8 F (36.6 C) (08/26 0639) Pulse Rate:  [46-103] 52 (08/26 0639) Resp:  [0-24] 18 (08/26 0400) BP: (110-206)/(10-159) 144/49 mmHg (08/26 0639) SpO2:  [33 %-100 %] 100 % (08/26 0803) FiO2 (%):  [40 %-60 %] 40 % (08/25 1400) Weight:  [56.24 kg (123 lb 15.8 oz)] 56.24 kg (123 lb 15.8 oz) (08/25 1533)  Intake/Output from previous day: 08/25 0701 - 08/26 0700 In: 2631.3 [P.O.:240; I.V.:1336.3; IV Piggyback:1000] Out: 190 [Urine:190] Intake/Output this shift:    Physical Exam:  Constitutional: Vital signs reviewed. WD WN in NAD   Eyes: PERRL, No scleral icterus.   Cardiovascular: RRR Pulmonary/Chest: Normal effort Abdominal: Soft. Non-tender, non-distended, bowel sounds are normal, no masses, organomegaly, or guarding present. Dressing is dry, without blood.  Lab Results:  Recent Labs  12/24/12 1445 12/25/12 0455  HGB 11.7* 9.5*  HCT 36.9 30.5*   BMET No results found for this basename: NA, K, CL, CO2, GLUCOSE, BUN, CREATININE, CALCIUM,  in the last 72 hours No results found for this basename: LABPT, INR,  in the last 72 hours No results found for this basename: LABURIN,  in the last 72 hours Results for orders placed in visit on 12/13/12  URINE CULTURE     Status: None   Collection Time    12/13/12  4:29 PM      Result Value Range Status   Urine Culture, Routine Final report   Final   Result 1 Lactobacillus species   Final   Comment: Greater than 100,000 colony forming units per mL     Susceptibility not normally performed on this organism.    Studies/Results: Ir US Guide Bx Asp/drain  12/24/2012   *RADIOLOGY REPORT*  Clinical Data: Right-sided renal calculi.  The patient requires nephrostomy access and advancement of a ureteral catheter prior to planned percutaneous nephrolithotomy procedure.  1.   ULTRASOUND GUIDANCE FOR PUNCTURE OF THE RIGHT RENAL COLLECTING SYSTEM. 2.  RIGHT PERCUTANEOUS NEPHROURETERAL CATHETER PLACEMENT  Comparison:  Abdominal film on 11/12/2012 at [facility].  Sedation: 4.0 mg IV Versed; 200 mcg IV Fentanyl.  Total Moderate Sedation Time: .  Contrast:  40 ml Omnipaque 300  Additional Medications:  400 mg IV Cipro.  Ciprofloxacin was given within two hours of incision.  Fluoroscopy Time: 11 minutes and 24 seconds.  Procedure:  The procedure, risks, benefits, and alternatives were explained to the patient.  Questions regarding the procedure were encouraged and answered.  The patient understands and consents to the procedure.  The right flank region was prepped with Betadine in a sterile fashion, and a sterile drape was applied covering the operative field.  A sterile gown and sterile gloves were used for the procedure. Local anesthesia was provided with 1% Lidocaine.  Ultrasound was used to localize the right kidney.  Under direct ultrasound guidance, a 21 gauge needle was advanced into the renal collecting system.  Ultrasound image documentation was performed. Aspiration of urine sample was performed followed by contrast injection.  Additional percutaneous access was performed at the level of a lower pole calix under fluoroscopy with a 21 gauge needle. Contrast injection was performed.  A guidewire was advanced via the needle.  A 3-French transitional dilator was advanced over a wire. Additional hydrophilic guide wires were advanced through the transitional dilator.  Separate 21 gauge needle puncture was  then performed of an interpolar peripheral calix under fluoroscopy.  Contrast injection was performed.  A transitional dilator was advanced over a guidewire.  A guidewire was advanced into the renal pelvis.  A 5- French catheter was then advanced to the level of the renal pelvis and further down the ureter over a guidewire.  The catheter was advanced to the level of the  bladder.  A fluoroscopic spot image was obtained to confirm catheter position.  The catheter was capped and secured to the skin with a silk retention suture.  Complications: None  Findings:  A dominant elongated renal calculus is present in the lower aspect of the collecting system.  After initial access of the collecting system and injection of contrast, this calculus was noted to lie within a lower pole infundibulum.  A second smaller calculus is suspected in the midportion of the kidney.  After successful opacification of the collecting system, some contrast did enter a lower pole calix peripheral to the dominant calculus.  After this calix was punctured and access obtained, a guidewire would not pass beyond the calculus and into the central renal collecting system.  There was not enough guide wire purchase in the lower pole collecting system for adequate percutaneous access.  Additional interpolar access was therefore performed and secured with a 5-French catheter successfully advanced into the bladder. This access will be utilized for the nephrolithotomy procedure to be performed later today by Dr. Retta Diones.  IMPRESSION: Percutaneous access of the right kidney was performed, as above, to secure access prior to percutaneous nephrolithotomy.  Initial access via a lower pole calix was unsuccessful in advancing a guidewire beyond a calculus in a lower infundibulum. Interpolar renal access was therefore performed above the level of the dominant calculus for nephrolithotomy access.   Original Report Authenticated By: Irish Lack, M.D.   Dg C-arm 1-60 Min-no Report  12/24/2012   CLINICAL DATA: kidney stones   C-ARM 1-60 MINUTES  Fluoroscopy was utilized by the requesting physician.  No radiographic  interpretation.    Ir [place] Abts Cath Perc Right  12/24/2012   *RADIOLOGY REPORT*  Clinical Data: Right-sided renal calculi.  The patient requires nephrostomy access and advancement of a ureteral catheter prior  to planned percutaneous nephrolithotomy procedure.  1.  ULTRASOUND GUIDANCE FOR PUNCTURE OF THE RIGHT RENAL COLLECTING SYSTEM. 2.  RIGHT PERCUTANEOUS NEPHROURETERAL CATHETER PLACEMENT  Comparison:  Abdominal film on 11/12/2012 at [facility].  Sedation: 4.0 mg IV Versed; 200 mcg IV Fentanyl.  Total Moderate Sedation Time: .  Contrast:  40 ml Omnipaque 300  Additional Medications:  400 mg IV Cipro.  Ciprofloxacin was given within two hours of incision.  Fluoroscopy Time: 11 minutes and 24 seconds.  Procedure:  The procedure, risks, benefits, and alternatives were explained to the patient.  Questions regarding the procedure were encouraged and answered.  The patient understands and consents to the procedure.  The right flank region was prepped with Betadine in a sterile fashion, and a sterile drape was applied covering the operative field.  A sterile gown and sterile gloves were used for the procedure. Local anesthesia was provided with 1% Lidocaine.  Ultrasound was used to localize the right kidney.  Under direct ultrasound guidance, a 21 gauge needle was advanced into the renal collecting system.  Ultrasound image documentation was performed. Aspiration of urine sample was performed followed by contrast injection.  Additional percutaneous access was performed at the level of a lower pole calix under fluoroscopy with a 21 gauge needle.  Contrast injection was performed.  A guidewire was advanced via the needle.  A 3-French transitional dilator was advanced over a wire. Additional hydrophilic guide wires were advanced through the transitional dilator.  Separate 21 gauge needle puncture was then performed of an interpolar peripheral calix under fluoroscopy.  Contrast injection was performed.  A transitional dilator was advanced over a guidewire.  A guidewire was advanced into the renal pelvis.  A 5- French catheter was then advanced to the level of the renal pelvis and further down the ureter over a  guidewire.  The catheter was advanced to the level of the bladder.  A fluoroscopic spot image was obtained to confirm catheter position.  The catheter was capped and secured to the skin with a silk retention suture.  Complications: None  Findings:  A dominant elongated renal calculus is present in the lower aspect of the collecting system.  After initial access of the collecting system and injection of contrast, this calculus was noted to lie within a lower pole infundibulum.  A second smaller calculus is suspected in the midportion of the kidney.  After successful opacification of the collecting system, some contrast did enter a lower pole calix peripheral to the dominant calculus.  After this calix was punctured and access obtained, a guidewire would not pass beyond the calculus and into the central renal collecting system.  There was not enough guide wire purchase in the lower pole collecting system for adequate percutaneous access.  Additional interpolar access was therefore performed and secured with a 5-French catheter successfully advanced into the bladder. This access will be utilized for the nephrolithotomy procedure to be performed later today by Dr. Retta Diones.  IMPRESSION: Percutaneous access of the right kidney was performed, as above, to secure access prior to percutaneous nephrolithotomy.  Initial access via a lower pole calix was unsuccessful in advancing a guidewire beyond a calculus in a lower infundibulum. Interpolar renal access was therefore performed above the level of the dominant calculus for nephrolithotomy access.   Original Report Authenticated By: Irish Lack, M.D.    Assessment/Plan:   Postoperative day 1 right percutaneous nephrolithotomy.  She is stable at the present time.  Hematocrit is down somewhat, and urinary output has been low.  She is currently getting a bolus.  Her nephrostomy tube was plugged early this morning we will see how she does with this, and later today if  she is stable and not having pain and tolerated her nephrostomy tube being, we will remove it.  I will recheck a hematocrit at about noon.  If that is stable, patient is overall stable urinary output, we'll consider discharge.   LOS: 1 day   Marcine Matar M 12/25/2012, 8:19 AM

## 2012-12-26 ENCOUNTER — Ambulatory Visit (INDEPENDENT_AMBULATORY_CARE_PROVIDER_SITE_OTHER): Payer: Medicare Other | Admitting: Family Medicine

## 2012-12-26 ENCOUNTER — Encounter: Payer: Self-pay | Admitting: Family Medicine

## 2012-12-26 VITALS — BP 138/72 | HR 63 | Temp 97.1°F | Wt 132.2 lb

## 2012-12-26 DIAGNOSIS — E785 Hyperlipidemia, unspecified: Secondary | ICD-10-CM

## 2012-12-26 DIAGNOSIS — N10 Acute tubulo-interstitial nephritis: Secondary | ICD-10-CM

## 2012-12-26 DIAGNOSIS — N390 Urinary tract infection, site not specified: Secondary | ICD-10-CM

## 2012-12-26 DIAGNOSIS — A419 Sepsis, unspecified organism: Secondary | ICD-10-CM

## 2012-12-26 DIAGNOSIS — N200 Calculus of kidney: Secondary | ICD-10-CM

## 2012-12-26 DIAGNOSIS — N823 Fistula of vagina to large intestine: Secondary | ICD-10-CM

## 2012-12-26 DIAGNOSIS — J9600 Acute respiratory failure, unspecified whether with hypoxia or hypercapnia: Secondary | ICD-10-CM

## 2012-12-26 DIAGNOSIS — F17200 Nicotine dependence, unspecified, uncomplicated: Secondary | ICD-10-CM

## 2012-12-26 DIAGNOSIS — N824 Other female intestinal-genital tract fistulae: Secondary | ICD-10-CM

## 2012-12-26 DIAGNOSIS — Z720 Tobacco use: Secondary | ICD-10-CM

## 2012-12-26 DIAGNOSIS — I214 Non-ST elevation (NSTEMI) myocardial infarction: Secondary | ICD-10-CM

## 2012-12-26 DIAGNOSIS — N179 Acute kidney failure, unspecified: Secondary | ICD-10-CM

## 2012-12-26 NOTE — Progress Notes (Signed)
Patient ID: Alexa Key, female   DOB: 08-May-1950, 62 y.o.   MRN: [id number] SUBJECTIVE: CC: Chief Complaint  Patient presents with  . Follow-up    follow up heart rate today is 63 . states done well with surgical procedure     HPI: Camr  For follow up post percutaneous nephrostomy by Dr Lenn Sink to remove several kidney stones. Placed on Percocet and has been nauseous. The daughter has reduced the frequncy of the medication percoocet and the nausea is improving.  BP and pulse better since she held the dose today.  Next is to have the pelvic surgery for the recto-vaginal fistula.  Patient has stopped smoking.  Post op patient had respiratory difficulties in the recovery room.  Past Medical History  Diagnosis Date  . CAD (coronary artery disease)   . PVD (peripheral vascular disease)   . CVA (cerebral infarction)     TIAx2  . HTN (hypertension)   . HLD (hyperlipidemia)   . Tobacco abuse   . Diverticulitis 08/04/12  . S/P partial lobectomy of lung 1997    per [facility], incidental finding, path c/w benign lesion  . Stroke   . COPD (chronic obstructive pulmonary disease)   . Shortness of breath   . Fibromyalgia   . PONV (postoperative nausea and vomiting)   . Myocardial infarction     07/2012   . Anxiety   . Diabetes mellitus without complication     on no meds   . GERD (gastroesophageal reflux disease)    Past Surgical History  Procedure Laterality Date  . Hysterectomoy    . Lumbar spine surgery    . Carpal tunnel release    . Coronary angioplasty    . Coronary stents     . Partial lobectomy of lung     . Abdominal hysterectomy    . Back surgery    . Nephrolithotomy Right 12/24/2012    Procedure: NEPHROLITHOTOMY PERCUTANEOUS;  Surgeon: Marcine Matar, MD;  Location: [facility];  Service: Urology;  Laterality: Right;   History   Social History  . Marital Status: Divorced    Spouse Name: N/A    Number of Children: N/A  . Years of Education: N/A   Occupational  History  . Not on file.   Social History Main Topics  . Smoking status: Current Every Day Smoker -- 0.25 packs/day for 49 years    Types: Cigarettes  . Smokeless tobacco: Never Used  . Alcohol Use: No  . Drug Use: No  . Sexual Activity: Not Currently   Other Topics Concern  . Not on file   Social History Narrative  . No narrative on file   No family history on file. Current Outpatient Prescriptions on File Prior to Visit  Medication Sig Dispense Refill  . acetaminophen (TYLENOL) 500 MG tablet Take 1,000 mg by mouth every 6 (six) hours as needed for pain.      Marland Kitchen amLODipine (NORVASC) 2.5 MG tablet Take 2.5 mg by mouth every morning.      Marland Kitchen aspirin 325 MG tablet Take 325 mg by mouth daily.      . budesonide-formoterol (SYMBICORT) 160-4.5 MCG/ACT inhaler Inhale 2 puffs into the lungs 2 (two) times daily.  1 Inhaler  12  . cholecalciferol (VITAMIN D) 1000 UNITS tablet Take 2,000 Units by mouth at bedtime.       . fenofibrate 160 MG tablet Take 160 mg by mouth every morning.      Marland Kitchen losartan (COZAAR) 100 MG  tablet Take 100 mg by mouth daily.      Marland Kitchen omeprazole (PRILOSEC) 20 MG capsule Take 20 mg by mouth daily.      . pravastatin (PRAVACHOL) 40 MG tablet Take 40 mg by mouth at bedtime.        No current facility-administered medications on file prior to visit.   Allergies  Allergen Reactions  . Penicillins Rash  . Sulfa Antibiotics Rash   There is no immunization history for the selected administration types on file for this patient. Prior to Admission medications   Medication Sig Start Date End Date Taking? Authorizing Provider  acetaminophen (TYLENOL) 500 MG tablet Take 1,000 mg by mouth every 6 (six) hours as needed for pain.   Yes Historical Provider, MD  amLODipine (NORVASC) 2.5 MG tablet Take 2.5 mg by mouth every morning.   Yes Historical Provider, MD  aspirin 325 MG tablet Take 325 mg by mouth daily.   Yes Historical Provider, MD  budesonide-formoterol (SYMBICORT) 160-4.5  MCG/ACT inhaler Inhale 2 puffs into the lungs 2 (two) times daily. 12/13/12  Yes Ileana Ladd, MD  cholecalciferol (VITAMIN D) 1000 UNITS tablet Take 2,000 Units by mouth at bedtime.    Yes Historical Provider, MD  fenofibrate 160 MG tablet Take 160 mg by mouth every morning.   Yes Historical Provider, MD  losartan (COZAAR) 100 MG tablet Take 100 mg by mouth daily.   Yes Historical Provider, MD  metoprolol tartrate (LOPRESSOR) 12.5 mg TABS tablet Take 0.5 tablets (12.5 mg total) by mouth daily. Start 12/22/2012 if heart rate comes up to 60/minute. 12/26/12  Yes Ileana Ladd, MD  omeprazole (PRILOSEC) 20 MG capsule Take 20 mg by mouth daily.   Yes Historical Provider, MD  oxyCODONE-acetaminophen (ROXICET) 5-325 MG per tablet Take 10.5 tablets by mouth every 6 (six) hours as needed for pain. 12/26/12  Yes Ileana Ladd, MD  pravastatin (PRAVACHOL) 40 MG tablet Take 40 mg by mouth at bedtime.    Yes Historical Provider, MD     ROS: As above in the HPI. All other systems are stable or negative.  OBJECTIVE: APPEARANCE:  Patient in no acute distress.The patient appeared well nourished and normally developed. Acyanotic. Waist: VITAL SIGNS:BP 138/72  Pulse 63  Temp(Src) 97.1 F (36.2 C) (Oral)  Wt 132 lb 3.2 oz (59.966 kg)  BMI 24.99 kg/m2  WF Appear nauseous.  SKIN: warm and  Dry without overt rashes, tattoos and scars  HEAD and Neck: without JVD, Head and scalp: normal Eyes:No scleral icterus. Fundi normal, eye movements normal. Ears: Auricle normal, canal normal, Tympanic membranes normal, insufflation normal. Nose: normal Throat: normal Neck & thyroid: normal  CHEST & LUNGS: Chest wall: normal Lungs: Clear  CVS: Reveals the PMI to be normally located. Regular rhythm, First and Second Heart sounds are normal,  absence of murmurs, rubs or gallops. Peripheral vasculature: Radial pulses: normal Dorsal pedis pulses: normal Posterior pulses: normal  ABDOMEN:  Appearance:  bandage in the right CVA area. Benign, no organomegaly, no masses, no Abdominal Aortic enlargement. No Guarding , no rebound. No Bruits. Bowel sounds: normal  RECTAL: N/A GU: N/A  EXTREMETIES: nonedematous.  MUSCULOSKELETAL:  Spine: normal Joints: intact  NEUROLOGIC: oriented to time,place and person; nonfocal. Results for orders placed during the hospital encounter of 12/24/12  HEMOGLOBIN AND HEMATOCRIT, BLOOD      Result Value Range   Hemoglobin 11.7 (*) 12.0 - 15.0 g/dL   HCT 16.1  09.6 - 04.5 %  GLUCOSE, CAPILLARY  Result Value Range   Glucose-Capillary 141 (*) 70 - 99 mg/dL   Comment 1 Documented in Chart     Comment 2 Notify RN    HEMOGLOBIN AND HEMATOCRIT, BLOOD      Result Value Range   Hemoglobin 9.5 (*) 12.0 - 15.0 g/dL   HCT 16.1 (*) 09.6 - 04.5 %  HEMOGLOBIN AND HEMATOCRIT, BLOOD      Result Value Range   Hemoglobin 9.7 (*) 12.0 - 15.0 g/dL   HCT 40.9 (*) 81.1 - 91.4 %    ASSESSMENT: HLD (hyperlipidemia)  NSTEMI (non-ST elevated myocardial infarction)  Pyelonephritis, acute  Tobacco user  Urosepsis  Acute renal failure  Acute respiratory failure  Kidney stone  Rectovaginal fistula   PLAN: Post op doing fairly well after a brief episode of acute post op respiratory failure. Advised NOT to restart her smoking. Medications adjusted  Meds ordered this encounter  Medications  . oxyCODONE-acetaminophen (ROXICET) 5-325 MG per tablet    Sig: Take 10.5 tablets by mouth every 6 (six) hours as needed for pain.    Dispense:  30 tablet    Refill:  0  . metoprolol tartrate (LOPRESSOR) 12.5 mg TABS tablet    Sig: Take 0.5 tablets (12.5 mg total) by mouth daily. Start 12/22/2012 if heart rate comes up to 60/minute.    Dispense:  30 tablet    Refill:  0    Medications Discontinued During This Encounter  Medication Reason  . oxyCODONE-acetaminophen (ROXICET) 5-325 MG per tablet Reorder  . metoprolol tartrate (LOPRESSOR) 12.5 mg TABS tablet  Reorder    Return has appointment already in september., for Recheck medical problems.  Holger Sokolowski P. [place] Charon, M.D.

## 2012-12-27 NOTE — Discharge Summary (Signed)
Date of admission: 12/24/2012  Date of discharge: 12/25/12  Admission diagnosis: Right sided urolithiasis  Discharge diagnosis:  same  Secondary diagnoses: CAD s/p MI, PVD, CVA, HTN, COPD, rectovaginal fistula, DM  History and Physical: For full details, please see admission history and physical. Briefly, Alexa Key is a 62 y.o. year old patient with urolithiasis.  She apparently presented to hospital in [facility] 07/2012 with sudden onset of flank pain and was found to have a right-sided kidney stone as well as a urinary tract infection. She was urgently stented by Dr. Nechama Guard, but by history had an intraoperative myocardial infarction. The patient also suffered postoperative renal insufficiency secondary to sepsis, which was treated eventually with transfer to [facility] in [place]. Her myocardial infarction was treated medically and she did not have stents placed by report. Currently, she is on no antiplatelet therapy. She does have her indwelling stent. She has not been able to have definitive treatment of her urolithiasis up to this point. Dr. Angelina Sheriff is her cardiologist. She has seen Dr. Eliott Nine while in the hospital for her perioperative myocardial infarction. By history, her creatinine has improved down to 1.4-1.6. By history, she has had quite a few urinary infections since the stent was placed. She has been found to have a rectovaginal fistula and has been referred to [facility] for eventual therapy.   Hospital Course: Pt was admitted and taken to the OR on 12/24/12 for a right percutaneous nephrolithotomy.  She tolerated the procedure well and was hemodynamically stable immediately post op.  She was extubated, however, she became hypercarbic and non responsive.  She was therefore re-intubated and awakened on the vent.  After she was awake, she was successfully extubated and able to breath on her own.  Post operatively she had decreased UO which responded to  fluid boluses and increased IVF rate.  Her vitals remained stable.  Her pain was well controlled, she was ambulating well, and was tolerating a regular diet on POD 1.  Her perc tube was successfully removed on POD 1.  She was doing well and felt stable for d/c home on POD 1.    Laboratory values:  Recent Labs  12/24/12 1445 12/25/12 0455 12/25/12 0910  HGB 11.7* 9.5* 9.7*  HCT 36.9 30.5* 31.2*   Lab Results  Component Value Date   CREATININE 1.62* 12/20/2012    Disposition: Home  Discharge instruction: The patient was instructed to be ambulatory but told to refrain from heavy lifting, strenuous activity, or driving.   Discharge medications:    Medication List    STOP taking these medications       diazepam 10 MG tablet  Commonly known as:  VALIUM     doxycycline 100 MG tablet  Commonly known as:  VIBRA-TABS      TAKE these medications       acetaminophen 500 MG tablet  Commonly known as:  TYLENOL  Take 1,000 mg by mouth every 6 (six) hours as needed for pain.     amLODipine 2.5 MG tablet  Commonly known as:  NORVASC  Take 2.5 mg by mouth every morning.     aspirin 325 MG tablet  Take 325 mg by mouth daily.     budesonide-formoterol 160-4.5 MCG/ACT inhaler  Commonly known as:  SYMBICORT  Inhale 2 puffs into the lungs 2 (two) times daily.     cholecalciferol 1000 UNITS tablet  Commonly known as:  VITAMIN D  Take 2,000 Units  by mouth at bedtime.     fenofibrate 160 MG tablet  Take 160 mg by mouth every morning.     losartan 100 MG tablet  Commonly known as:  COZAAR  Take 100 mg by mouth daily.     omeprazole 20 MG capsule  Commonly known as:  PRILOSEC  Take 20 mg by mouth daily.     pravastatin 40 MG tablet  Commonly known as:  PRAVACHOL  Take 40 mg by mouth at bedtime.        Followup:      Follow-up Information   Follow up with Chelsea Aus, MD. (As scheduled)    Specialty:  Urology   Contact information:   [address] [place] [address] [phone]

## 2013-01-10 ENCOUNTER — Other Ambulatory Visit: Payer: Self-pay | Admitting: Family Medicine

## 2013-01-17 ENCOUNTER — Ambulatory Visit (INDEPENDENT_AMBULATORY_CARE_PROVIDER_SITE_OTHER): Payer: Medicare Other | Admitting: Family Medicine

## 2013-01-17 ENCOUNTER — Encounter: Payer: Self-pay | Admitting: Family Medicine

## 2013-01-17 VITALS — BP 166/71 | HR 68 | Temp 98.0°F | Wt 128.0 lb

## 2013-01-17 DIAGNOSIS — N200 Calculus of kidney: Secondary | ICD-10-CM

## 2013-01-17 DIAGNOSIS — N390 Urinary tract infection, site not specified: Secondary | ICD-10-CM

## 2013-01-17 DIAGNOSIS — Z720 Tobacco use: Secondary | ICD-10-CM

## 2013-01-17 DIAGNOSIS — F17200 Nicotine dependence, unspecified, uncomplicated: Secondary | ICD-10-CM

## 2013-01-17 DIAGNOSIS — N10 Acute tubulo-interstitial nephritis: Secondary | ICD-10-CM

## 2013-01-17 DIAGNOSIS — A419 Sepsis, unspecified organism: Secondary | ICD-10-CM

## 2013-01-17 DIAGNOSIS — D649 Anemia, unspecified: Secondary | ICD-10-CM

## 2013-01-17 DIAGNOSIS — E785 Hyperlipidemia, unspecified: Secondary | ICD-10-CM

## 2013-01-17 DIAGNOSIS — I10 Essential (primary) hypertension: Secondary | ICD-10-CM

## 2013-01-17 DIAGNOSIS — N179 Acute kidney failure, unspecified: Secondary | ICD-10-CM

## 2013-01-17 DIAGNOSIS — I214 Non-ST elevation (NSTEMI) myocardial infarction: Secondary | ICD-10-CM

## 2013-01-17 MED ORDER — AMLODIPINE BESYLATE 5 MG PO TABS: 5.0000 mg | ORAL_TABLET | Freq: Every morning | ORAL | Status: DC

## 2013-01-17 MED ORDER — LOSARTAN POTASSIUM 100 MG PO TABS: 100.0000 mg | ORAL_TABLET | Freq: Every day | ORAL | Status: DC

## 2013-01-17 NOTE — Progress Notes (Signed)
Patient ID: Alexa Key, female   DOB: 30-Jul-1950, 62 y.o.   MRN: [id number] SUBJECTIVE: CC: Chief Complaint  Patient presents with  . Follow-up    1 month     HPI:  Saw th eurologist yesterday and told no UTI. She is feeling well and  Happy and wonders if she still needs to pursue the specialty visit at [facility] for her recto-vesical fistula. No complaints today. Stopped smoking regular cigarettes but using electronic cigarettes.  Past Medical History  Diagnosis Date  . CAD (coronary artery disease)   . PVD (peripheral vascular disease)   . CVA (cerebral infarction)     TIAx2  . HTN (hypertension)   . HLD (hyperlipidemia)   . Tobacco abuse   . Diverticulitis 08/04/12  . S/P partial lobectomy of lung 1997    per [facility], incidental finding, path c/w benign lesion  . Stroke   . COPD (chronic obstructive pulmonary disease)   . Shortness of breath   . Fibromyalgia   . PONV (postoperative nausea and vomiting)   . Myocardial infarction     07/2012   . Anxiety   . Diabetes mellitus without complication     on no meds   . GERD (gastroesophageal reflux disease)    Past Surgical History  Procedure Laterality Date  . Hysterectomoy    . Lumbar spine surgery    . Carpal tunnel release    . Coronary angioplasty    . Coronary stents     . Partial lobectomy of lung     . Abdominal hysterectomy    . Back surgery    . Nephrolithotomy Right 12/24/2012    Procedure: NEPHROLITHOTOMY PERCUTANEOUS;  Surgeon: Marcine Matar, MD;  Location: [facility] ORS;  Service: Urology;  Laterality: Right;   History   Social History  . Marital Status: Divorced    Spouse Name: N/A    Number of Children: N/A  . Years of Education: N/A   Occupational History  . Not on file.   Social History Main Topics  . Smoking status: Current Every Day Smoker -- 0.25 packs/day for 49 years    Types: Cigarettes  . Smokeless tobacco: Never Used  . Alcohol Use: No  . Drug Use: No  . Sexual Activity: Not  Currently   Other Topics Concern  . Not on file   Social History Narrative  . No narrative on file   No family history on file. Current Outpatient Prescriptions on File Prior to Visit  Medication Sig Dispense Refill  . acetaminophen (TYLENOL) 500 MG tablet Take 1,000 mg by mouth every 6 (six) hours as needed for pain.      Marland Kitchen amLODipine (NORVASC) 2.5 MG tablet Take 2.5 mg by mouth every morning.      Marland Kitchen aspirin 325 MG tablet Take 325 mg by mouth daily.      . budesonide-formoterol (SYMBICORT) 160-4.5 MCG/ACT inhaler Inhale 2 puffs into the lungs 2 (two) times daily.  1 Inhaler  12  . cholecalciferol (VITAMIN D) 1000 UNITS tablet Take 2,000 Units by mouth at bedtime.       . fenofibrate 160 MG tablet Take 160 mg by mouth every morning.      Marland Kitchen losartan (COZAAR) 100 MG tablet Take 100 mg by mouth daily.      Marland Kitchen omeprazole (PRILOSEC) 20 MG capsule Take 20 mg by mouth daily.      . pravastatin (PRAVACHOL) 40 MG tablet Take 40 mg by mouth at bedtime.  No current facility-administered medications on file prior to visit.   Allergies  Allergen Reactions  . Penicillins Rash  . Sulfa Antibiotics Rash   There is no immunization history for the selected administration types on file for this patient. Prior to Admission medications   Medication Sig Start Date End Date Taking? Authorizing Provider  acetaminophen (TYLENOL) 500 MG tablet Take 1,000 mg by mouth every 6 (six) hours as needed for pain.    Historical Provider, MD  amLODipine (NORVASC) 2.5 MG tablet Take 2.5 mg by mouth every morning.    Historical Provider, MD  aspirin 325 MG tablet Take 325 mg by mouth daily.    Historical Provider, MD  budesonide-formoterol (SYMBICORT) 160-4.5 MCG/ACT inhaler Inhale 2 puffs into the lungs 2 (two) times daily. 12/13/12   Ileana Ladd, MD  cholecalciferol (VITAMIN D) 1000 UNITS tablet Take 2,000 Units by mouth at bedtime.     Historical Provider, MD  fenofibrate 160 MG tablet Take 160 mg by mouth  every morning.    Historical Provider, MD  losartan (COZAAR) 100 MG tablet Take 100 mg by mouth daily.    Historical Provider, MD  metoprolol tartrate (LOPRESSOR) 25 MG tablet  01/10/13   Ileana Ladd, MD  omeprazole (PRILOSEC) 20 MG capsule Take 20 mg by mouth daily.    Historical Provider, MD  pravastatin (PRAVACHOL) 40 MG tablet Take 40 mg by mouth at bedtime.     Historical Provider, MD     ROS: As above in the HPI. All other systems are stable or negative.  OBJECTIVE: APPEARANCE:  Patient in no acute distress.The patient appeared well nourished and normally developed. Acyanotic. Waist: VITAL SIGNS:BP 166/71  Pulse 68  Temp(Src) 98 F (36.7 C) (Oral)  Wt 128 lb (58.06 kg)  BMI 24.2 kg/m2 WF  SKIN: warm and  Dry without overt rashes, tattoos and scars  HEAD and Neck: without JVD, Head and scalp: normal Eyes:No scleral icterus. Fundi normal, eye movements normal. Ears: Auricle normal, canal normal, Tympanic membranes normal, insufflation normal. Nose: normal Throat: normal Neck & thyroid: normal  CHEST & LUNGS: Chest wall: normal Lungs: Clear  CVS: Reveals the PMI to be normally located. Regular rhythm, First and Second Heart sounds are normal,  absence of murmurs, rubs or gallops. Peripheral vasculature: Radial pulses: normal  ABDOMEN:  Appearance:normal Benign, no organomegaly, no masses, no Abdominal Aortic enlargement. No Guarding , no rebound. No Bruits. Bowel sounds: normal  RECTAL: N/A GU: N/A  EXTREMETIES: nonedematous.  MUSCULOSKELETAL:  Spine: normal Joints: intact  NEUROLOGIC: oriented to time,place and person; nonfocal.  ASSESSMENT: HLD (hyperlipidemia) - Plan: CMP14+EGFR, NMR, lipoprofile  NSTEMI (non-ST elevated myocardial infarction)  Pyelonephritis, acute  Tobacco user  Urosepsis  Acute renal failure - Plan: CMP14+EGFR  Kidney stone  Anemia - Plan: POCT CBC  HTN (hypertension) - Plan: amLODipine (NORVASC) 5 MG tablet,  losartan (COZAAR) 100 MG tablet  BP now going up and not at goal PLAN: Orders Placed This Encounter  Procedures  . CMP14+EGFR  . NMR, lipoprofile  . POCT CBC    Meds ordered this encounter  Medications  . metoprolol tartrate (LOPRESSOR) 25 MG tablet    Sig:   . amLODipine (NORVASC) 5 MG tablet    Sig: Take 1 tablet (5 mg total) by mouth every morning.    Dispense:  90 tablet    Refill:  3  . losartan (COZAAR) 100 MG tablet    Sig: Take 1 tablet (100 mg total) by mouth  daily.    Dispense:  90 tablet    Refill:  3   Results for orders placed during the hospital encounter of 12/24/12  HEMOGLOBIN AND HEMATOCRIT, BLOOD      Result Value Range   Hemoglobin 11.7 (*) 12.0 - 15.0 g/dL   HCT 16.1  09.6 - 04.5 %  GLUCOSE, CAPILLARY      Result Value Range   Glucose-Capillary 141 (*) 70 - 99 mg/dL   Comment 1 Documented in Chart     Comment 2 Notify RN    HEMOGLOBIN AND HEMATOCRIT, BLOOD      Result Value Range   Hemoglobin 9.5 (*) 12.0 - 15.0 g/dL   HCT 40.9 (*) 81.1 - 91.4 %  HEMOGLOBIN AND HEMATOCRIT, BLOOD      Result Value Range   Hemoglobin 9.7 (*) 12.0 - 15.0 g/dL   HCT 78.2 (*) 95.6 - 21.3 %    Reviewed records in [facility]  DASH Diet  Return in about 2 months (around 03/19/2013) for Recheck medical problems.especially the BP.  Noemie Devivo P. Modesto Charon, M.D.

## 2013-01-18 LAB — POCT CBC
Granulocyte percent: 67.2 %G (ref 37–80)
HCT, POC: 33.9 % — AB (ref 37.7–47.9)
Hemoglobin: 11.2 g/dL — AB (ref 12.2–16.2)
Lymph, poc: 2.5 (ref 0.6–3.4)
MCH, POC: 32.5 pg — AB (ref 27–31.2)
MCHC: 33.1 g/dL (ref 31.8–35.4)
MCV: 98.2 fL — AB (ref 80–97)
MPV: 8.1 fL (ref 0–99.8)
POC Granulocyte: 5.8 (ref 2–6.9)
POC LYMPH PERCENT: 29 %L (ref 10–50)
Platelet Count, POC: 313 10*3/uL (ref 142–424)
RBC: 3.5 M/uL — AB (ref 4.04–5.48)
RDW, POC: 14.7 %
WBC: 8.6 10*3/uL (ref 4.6–10.2)

## 2013-01-19 ENCOUNTER — Other Ambulatory Visit: Payer: Self-pay | Admitting: Family Medicine

## 2013-01-19 DIAGNOSIS — E785 Hyperlipidemia, unspecified: Secondary | ICD-10-CM

## 2013-01-19 LAB — CMP14+EGFR
ALT: 7 IU/L (ref 0–32)
AST: 11 IU/L (ref 0–40)
Albumin/Globulin Ratio: 1.5 (ref 1.1–2.5)
Albumin: 3.9 g/dL (ref 3.6–4.8)
Alkaline Phosphatase: 55 IU/L (ref 39–117)
BUN/Creatinine Ratio: 18 (ref 11–26)
BUN: 26 mg/dL (ref 8–27)
CO2: 22 mmol/L (ref 18–29)
Calcium: 9.6 mg/dL (ref 8.6–10.2)
Chloride: 108 mmol/L (ref 97–108)
Creatinine, Ser: 1.48 mg/dL — ABNORMAL HIGH (ref 0.57–1.00)
GFR calc Af Amer: 43 mL/min/{1.73_m2} — ABNORMAL LOW (ref >59)
GFR calc non Af Amer: 37 mL/min/{1.73_m2} — ABNORMAL LOW (ref >59)
Globulin, Total: 2.6 g/dL (ref 1.5–4.5)
Glucose: 82 mg/dL (ref 65–99)
Potassium: 5.1 mmol/L (ref 3.5–5.2)
Sodium: 145 mmol/L — ABNORMAL HIGH (ref 134–144)
Total Bilirubin: 0.2 mg/dL (ref 0.0–1.2)
Total Protein: 6.5 g/dL (ref 6.0–8.5)

## 2013-01-19 LAB — NMR, LIPOPROFILE
Cholesterol: 159 mg/dL (ref <200)
HDL Cholesterol by NMR: 41 mg/dL (ref >=40)
HDL Particle Number: 41.1 umol/L (ref >=30.5)
LDL Particle Number: 1565 nmol/L — ABNORMAL HIGH (ref <1000)
LDL Size: 21.1 nm (ref >20.5)
LDLC SERPL CALC-MCNC: 82 mg/dL (ref <100)
LP-IR Score: 47 — ABNORMAL HIGH (ref <=45)
Small LDL Particle Number: 963 nmol/L — ABNORMAL HIGH (ref <=527)
Triglycerides by NMR: 182 mg/dL — ABNORMAL HIGH (ref <150)

## 2013-01-19 MED ORDER — PRAVASTATIN SODIUM 80 MG PO TABS: 80.0000 mg | ORAL_TABLET | Freq: Every day | ORAL | Status: DC

## 2013-01-22 ENCOUNTER — Ambulatory Visit (INDEPENDENT_AMBULATORY_CARE_PROVIDER_SITE_OTHER): Payer: Medicare Other | Admitting: General Practice

## 2013-01-22 ENCOUNTER — Encounter: Payer: Self-pay | Admitting: General Practice

## 2013-01-22 VITALS — BP 168/82 | HR 76 | Temp 98.7°F | Ht 61.0 in | Wt 128.0 lb

## 2013-01-22 DIAGNOSIS — R350 Frequency of micturition: Secondary | ICD-10-CM

## 2013-01-22 DIAGNOSIS — IMO0001 Reserved for inherently not codable concepts without codable children: Secondary | ICD-10-CM

## 2013-01-22 DIAGNOSIS — N390 Urinary tract infection, site not specified: Secondary | ICD-10-CM

## 2013-01-22 LAB — POCT URINALYSIS DIPSTICK
Bilirubin, UA: NEGATIVE
Glucose, UA: NEGATIVE
Spec Grav, UA: 1.02

## 2013-01-22 LAB — POCT UA - MICROSCOPIC ONLY
Bacteria, U Microscopic: NEGATIVE
Crystals, Ur, HPF, POC: NEGATIVE

## 2013-01-22 MED ORDER — CIPROFLOXACIN HCL 500 MG PO TABS: 500.0000 mg | ORAL_TABLET | Freq: Two times a day (BID) | ORAL | Status: DC

## 2013-01-22 NOTE — Progress Notes (Signed)
  Subjective:    Patient ID: Alexa Key, female    DOB: 22-Apr-1951, 62 y.o.   MRN: [id number]  Urinary Tract Infection  This is a new problem. The current episode started in the past 7 days. The problem occurs every urination. The problem has been gradually worsening. The quality of the pain is described as aching and burning. The pain is at a severity of 5/10. The pain is moderate. There has been no fever. She is not sexually active. There is no history of pyelonephritis. Associated symptoms include flank pain, frequency and urgency. Pertinent negatives include no chills, hematuria, hesitancy or nausea. She has tried nothing for the symptoms.      Review of Systems  Constitutional: Negative for chills.  Gastrointestinal: Negative for nausea.  Genitourinary: Positive for dysuria, urgency, frequency and flank pain. Negative for hesitancy, hematuria and difficulty urinating.  Neurological: Negative for dizziness, weakness and headaches.       Objective:   Physical Exam  Constitutional: She is oriented to person, place, and time. She appears well-developed and well-nourished.  Cardiovascular: Normal rate, regular rhythm and normal heart sounds.   Pulmonary/Chest: Effort normal and breath sounds normal.  Abdominal: Soft. Bowel sounds are normal. She exhibits no distension. There is tenderness in the suprapubic area. There is no CVA tenderness.  Neurological: She is alert and oriented to person, place, and time.  Skin: Skin is warm and dry.  Psychiatric: She has a normal mood and affect.    Results for orders placed in visit on 01/22/13  POCT URINALYSIS DIPSTICK      Result Value Range   Color, UA yellow     Clarity, UA cloudy     Glucose, UA neg     Bilirubin, UA neg     Ketones, UA neg     Spec Grav, UA 1.020     Blood, UA mod     pH, UA 5.0     Protein, UA neg     Urobilinogen, UA negative     Nitrite, UA neg     Leukocytes, UA moderate (2+)    POCT UA - MICROSCOPIC ONLY      Result Value Range   WBC, Ur, HPF, POC 20-25     RBC, urine, microscopic 10-15     Bacteria, U Microscopic neg     Mucus, UA occ     Epithelial cells, urine per micros occ     Crystals, Ur, HPF, POC neg     Casts, Ur, LPF, POC neg     Yeast, UA neg          Assessment & Plan:  1. Frequency - POCT urinalysis dipstick - POCT UA - Microscopic Only  2. UTI (urinary tract infection) Increase fluid intake AZO over the counter X2 days Frequent voiding Proper perineal hygiene RTO prn Patient verbalized understanding [name], FNP-C

## 2013-01-22 NOTE — Patient Instructions (Signed)
Urinary Tract Infection  Urinary tract infections (UTIs) can develop anywhere along your urinary tract. Your urinary tract is your body's drainage system for removing wastes and extra water. Your urinary tract includes two kidneys, two ureters, a bladder, and a urethra. Your kidneys are a pair of bean-shaped organs. Each kidney is about the size of your fist. They are located below your ribs, one on each side of your spine.  CAUSES  Infections are caused by microbes, which are microscopic organisms, including fungi, viruses, and bacteria. These organisms are so small that they can only be seen through a microscope. Bacteria are the microbes that most commonly cause UTIs.  SYMPTOMS   Symptoms of UTIs may vary by age and gender of the patient and by the location of the infection. Symptoms in young women typically include a frequent and intense urge to urinate and a painful, burning feeling in the bladder or urethra during urination. Older women and men are more likely to be tired, shaky, and weak and have muscle aches and abdominal pain. A fever may mean the infection is in your kidneys. Other symptoms of a kidney infection include pain in your back or sides below the ribs, nausea, and vomiting.  DIAGNOSIS  To diagnose a UTI, your caregiver will ask you about your symptoms. Your caregiver also will ask to provide a urine sample. The urine sample will be tested for bacteria and white blood cells. White blood cells are made by your body to help fight infection.  TREATMENT   Typically, UTIs can be treated with medication. Because most UTIs are caused by a bacterial infection, they usually can be treated with the use of antibiotics. The choice of antibiotic and length of treatment depend on your symptoms and the type of bacteria causing your infection.  HOME CARE INSTRUCTIONS   If you were prescribed antibiotics, take them exactly as your caregiver instructs you. Finish the medication even if you feel better after you  have only taken some of the medication.   Drink enough water and fluids to keep your urine clear or pale yellow.   Avoid caffeine, tea, and carbonated beverages. They tend to irritate your bladder.   Empty your bladder often. Avoid holding urine for long periods of time.   Empty your bladder before and after sexual intercourse.   After a bowel movement, women should cleanse from front to back. Use each tissue only once.  SEEK MEDICAL CARE IF:    You have back pain.   You develop a fever.   Your symptoms do not begin to resolve within 3 days.  SEEK IMMEDIATE MEDICAL CARE IF:    You have severe back pain or lower abdominal pain.   You develop chills.   You have nausea or vomiting.   You have continued burning or discomfort with urination.  MAKE SURE YOU:    Understand these instructions.   Will watch your condition.   Will get help right away if you are not doing well or get worse.  Document Released: 01/26/2005 Document Revised: 10/18/2011 Document Reviewed: 05/27/2011  [employer] Patient Information 2014 [employer].

## 2013-04-04 ENCOUNTER — Encounter: Payer: Self-pay | Admitting: Family Medicine

## 2013-04-04 ENCOUNTER — Ambulatory Visit (INDEPENDENT_AMBULATORY_CARE_PROVIDER_SITE_OTHER): Payer: Medicare Other | Admitting: Family Medicine

## 2013-04-04 VITALS — BP 128/64 | HR 46 | Temp 98.1°F | Ht 61.0 in | Wt 135.2 lb

## 2013-04-04 DIAGNOSIS — N200 Calculus of kidney: Secondary | ICD-10-CM

## 2013-04-04 DIAGNOSIS — E785 Hyperlipidemia, unspecified: Secondary | ICD-10-CM

## 2013-04-04 DIAGNOSIS — N824 Other female intestinal-genital tract fistulae: Secondary | ICD-10-CM

## 2013-04-04 DIAGNOSIS — Z720 Tobacco use: Secondary | ICD-10-CM

## 2013-04-04 DIAGNOSIS — N823 Fistula of vagina to large intestine: Secondary | ICD-10-CM

## 2013-04-04 DIAGNOSIS — I214 Non-ST elevation (NSTEMI) myocardial infarction: Secondary | ICD-10-CM

## 2013-04-04 DIAGNOSIS — F17200 Nicotine dependence, unspecified, uncomplicated: Secondary | ICD-10-CM

## 2013-04-04 MED ORDER — FENOFIBRATE 160 MG PO TABS: 160.0000 mg | ORAL_TABLET | Freq: Every morning | ORAL | Status: DC

## 2013-04-04 NOTE — Patient Instructions (Signed)
Cut the metoprolol in 1/2  Smoking Cessation Quitting smoking is important to your health and has many advantages. However, it is not always easy to quit since nicotine is a very addictive drug. Often times, people try 3 times or more before being able to quit. This document explains the best ways for you to prepare to quit smoking. Quitting takes hard work and a lot of effort, but you can do it. ADVANTAGES OF QUITTING SMOKING  You will live longer, feel better, and live better.  Your body will feel the impact of quitting smoking almost immediately.  Within 20 minutes, blood pressure decreases. Your pulse returns to its normal level.  After 8 hours, carbon monoxide levels in the blood return to normal. Your oxygen level increases.  After 24 hours, the chance of having a heart attack starts to decrease. Your breath, hair, and body stop smelling like smoke.  After 48 hours, damaged nerve endings begin to recover. Your sense of taste and smell improve.  After 72 hours, the body is virtually free of nicotine. Your bronchial tubes relax and breathing becomes easier.  After 2 to 12 weeks, lungs can hold more air. Exercise becomes easier and circulation improves.  The risk of having a heart attack, stroke, cancer, or lung disease is greatly reduced.  After 1 year, the risk of coronary heart disease is cut in half.  After 5 years, the risk of stroke falls to the same as a nonsmoker.  After 10 years, the risk of lung cancer is cut in half and the risk of other cancers decreases significantly.  After 15 years, the risk of coronary heart disease drops, usually to the level of a nonsmoker.  If you are pregnant, quitting smoking will improve your chances of having a healthy baby.  The people you live with, especially any children, will be healthier.  You will have extra money to spend on things other than cigarettes. QUESTIONS TO THINK ABOUT BEFORE ATTEMPTING TO QUIT You may want to talk  about your answers with your caregiver.  Why do you want to quit?  If you tried to quit in the past, what helped and what did not?  What will be the most difficult situations for you after you quit? How will you plan to handle them?  Who can help you through the tough times? Your family? Friends? A caregiver?  What pleasures do you get from smoking? What ways can you still get pleasure if you quit? Here are some questions to ask your caregiver:  How can you help me to be successful at quitting?  What medicine do you think would be best for me and how should I take it?  What should I do if I need more help?  What is smoking withdrawal like? How can I get information on withdrawal? GET READY  Set a quit date.  Change your environment by getting rid of all cigarettes, ashtrays, matches, and lighters in your home, car, or work. Do not let people smoke in your home.  Review your past attempts to quit. Think about what worked and what did not. GET SUPPORT AND ENCOURAGEMENT You have a better chance of being successful if you have help. You can get support in many ways.  Tell your family, friends, and co-workers that you are going to quit and need their support. Ask them not to smoke around you.  Get individual, group, or telephone counseling and support. Programs are available at [employer] and health centers.  Call your local health department for information about programs in your area.  Spiritual beliefs and practices may help some smokers quit.  Download a "quit meter" on your computer to keep track of quit statistics, such as how long you have gone without smoking, cigarettes not smoked, and money saved.  Get a self-help book about quitting smoking and staying off of tobacco. LEARN NEW SKILLS AND BEHAVIORS  Distract yourself from urges to smoke. Talk to someone, go for a walk, or occupy your time with a task.  Change your normal routine. Take a different route to work.  Drink tea instead of coffee. Eat breakfast in a different place.  Reduce your stress. Take a hot bath, exercise, or read a book.  Plan something enjoyable to do every day. Reward yourself for not smoking.  Explore interactive web-based programs that specialize in helping you quit. GET MEDICINE AND USE IT CORRECTLY Medicines can help you stop smoking and decrease the urge to smoke. Combining medicine with the above behavioral methods and support can greatly increase your chances of successfully quitting smoking.  Nicotine replacement therapy helps deliver nicotine to your body without the negative effects and risks of smoking. Nicotine replacement therapy includes nicotine gum, lozenges, inhalers, nasal sprays, and skin patches. Some may be available over-the-counter and others require a prescription.  Antidepressant medicine helps people abstain from smoking, but how this works is unknown. This medicine is available by prescription.  Nicotinic receptor partial agonist medicine simulates the effect of nicotine in your brain. This medicine is available by prescription. Ask your caregiver for advice about which medicines to use and how to use them based on your health history. Your caregiver will tell you what side effects to look out for if you choose to be on a medicine or therapy. Carefully read the information on the package. Do not use any other product containing nicotine while using a nicotine replacement product.  RELAPSE OR DIFFICULT SITUATIONS Most relapses occur within the first 3 months after quitting. Do not be discouraged if you start smoking again. Remember, most people try several times before finally quitting. You may have symptoms of withdrawal because your body is used to nicotine. You may crave cigarettes, be irritable, feel very hungry, cough often, get headaches, or have difficulty concentrating. The withdrawal symptoms are only temporary. They are strongest when you first quit,  but they will go away within 10 14 days. To reduce the chances of relapse, try to:  Avoid drinking alcohol. Drinking lowers your chances of successfully quitting.  Reduce the amount of caffeine you consume. Once you quit smoking, the amount of caffeine in your body increases and can give you symptoms, such as a rapid heartbeat, sweating, and anxiety.  Avoid smokers because they can make you want to smoke.  Do not let weight gain distract you. Many smokers will gain weight when they quit, usually less than 10 pounds. Eat a healthy diet and stay active. You can always lose the weight gained after you quit.  Find ways to improve your mood other than smoking. FOR MORE INFORMATION  www.smokefree.gov  Document Released: 04/12/2001 Document Revised: 10/18/2011 Document Reviewed: 07/28/2011 [facility] Patient Information 2014 [place], [place].

## 2013-04-04 NOTE — Progress Notes (Signed)
Patient ID: Alexa Key, female   DOB: 1951-02-17, 62 y.o.   MRN: [id number] SUBJECTIVE: CC: Chief Complaint  Patient presents with  . Follow-up    2 month follow up    HPI:  her for follow up of her medical problems. Did not follow through with the specialty referral to [facility] in regards to the rectovaginal fistula. Also has not seen her Cardiologist for follow up. She believes that she does not need to follow up because she feels well.  She continues to smoke.  Past Medical History  Diagnosis Date  . CAD (coronary artery disease)   . PVD (peripheral vascular disease)   . CVA (cerebral infarction)     TIAx2  . HTN (hypertension)   . HLD (hyperlipidemia)   . Tobacco abuse   . Diverticulitis 08/04/12  . S/P partial lobectomy of lung 1997    per [facility], incidental finding, path c/w benign lesion  . Stroke   . COPD (chronic obstructive pulmonary disease)   . Shortness of breath   . Fibromyalgia   . PONV (postoperative nausea and vomiting)   . Myocardial infarction     07/2012   . Anxiety   . Diabetes mellitus without complication     on no meds   . GERD (gastroesophageal reflux disease)    Past Surgical History  Procedure Laterality Date  . Hysterectomoy    . Lumbar spine surgery    . Carpal tunnel release    . Coronary angioplasty    . Coronary stents     . Partial lobectomy of lung     . Abdominal hysterectomy    . Back surgery    . Nephrolithotomy Right 12/24/2012    Procedure: NEPHROLITHOTOMY PERCUTANEOUS;  Surgeon: Marcine Matar, MD;  Location: [facility];  Service: Urology;  Laterality: Right;   History   Social History  . Marital Status: Divorced    Spouse Name: N/A    Number of Children: N/A  . Years of Education: N/A   Occupational History  . Not on file.   Social History Main Topics  . Smoking status: Current Every Day Smoker -- 0.25 packs/day for 49 years    Types: Cigarettes  . Smokeless tobacco: Never Used  . Alcohol Use: No  . Drug Use:  No  . Sexual Activity: Not Currently   Other Topics Concern  . Not on file   Social History Narrative  . No narrative on file   No family history on file. Current Outpatient Prescriptions on File Prior to Visit  Medication Sig Dispense Refill  . acetaminophen (TYLENOL) 500 MG tablet Take 1,000 mg by mouth every 6 (six) hours as needed for pain.      Marland Kitchen amLODipine (NORVASC) 5 MG tablet Take 1 tablet (5 mg total) by mouth every morning.  90 tablet  3  . aspirin 325 MG tablet Take 325 mg by mouth daily.      . budesonide-formoterol (SYMBICORT) 160-4.5 MCG/ACT inhaler Inhale 2 puffs into the lungs 2 (two) times daily.  1 Inhaler  12  . cholecalciferol (VITAMIN D) 1000 UNITS tablet Take 2,000 Units by mouth at bedtime.       Marland Kitchen losartan (COZAAR) 100 MG tablet Take 1 tablet (100 mg total) by mouth daily.  90 tablet  3  . metoprolol tartrate (LOPRESSOR) 25 MG tablet       . omeprazole (PRILOSEC) 20 MG capsule Take 20 mg by mouth daily.      Marland Kitchen  pravastatin (PRAVACHOL) 80 MG tablet Take 1 tablet (80 mg total) by mouth at bedtime.  30 tablet  5   No current facility-administered medications on file prior to visit.   Allergies  Allergen Reactions  . Penicillins Rash  . Sulfa Antibiotics Rash   There is no immunization history for the selected administration types on file for this patient. Prior to Admission medications   Medication Sig Start Date End Date Taking? Authorizing Provider  acetaminophen (TYLENOL) 500 MG tablet Take 1,000 mg by mouth every 6 (six) hours as needed for pain.   Yes Historical Provider, MD  amLODipine (NORVASC) 5 MG tablet Take 1 tablet (5 mg total) by mouth every morning. 01/17/13  Yes Ileana Ladd, MD  aspirin 325 MG tablet Take 325 mg by mouth daily.   Yes Historical Provider, MD  budesonide-formoterol (SYMBICORT) 160-4.5 MCG/ACT inhaler Inhale 2 puffs into the lungs 2 (two) times daily. 12/13/12  Yes Ileana Ladd, MD  cholecalciferol (VITAMIN D) 1000 UNITS tablet  Take 2,000 Units by mouth at bedtime.    Yes Historical Provider, MD  ciprofloxacin (CIPRO) 500 MG tablet Take 1 tablet (500 mg total) by mouth 2 (two) times daily. 01/22/13  Yes Mae Shelda Altes, FNP  fenofibrate 160 MG tablet Take 160 mg by mouth every morning.   Yes Historical Provider, MD  losartan (COZAAR) 100 MG tablet Take 1 tablet (100 mg total) by mouth daily. 01/17/13  Yes Ileana Ladd, MD  metoprolol tartrate (LOPRESSOR) 25 MG tablet  01/10/13  Yes Ileana Ladd, MD  omeprazole (PRILOSEC) 20 MG capsule Take 20 mg by mouth daily.   Yes Historical Provider, MD  pravastatin (PRAVACHOL) 80 MG tablet Take 1 tablet (80 mg total) by mouth at bedtime. 01/19/13  Yes Ileana Ladd, MD     ROS: As above in the HPI. All other systems are stable or negative.  OBJECTIVE: APPEARANCE:  Patient in no acute distress.The patient appeared well nourished and normally developed. Acyanotic. Waist: VITAL SIGNS:BP 128/64  Pulse 46  Temp(Src) 98.1 F (36.7 C) (Oral)  Ht 5\' 1"  (1.549 m)  Wt 135 lb 3.2 oz (61.326 kg)  BMI 25.56 kg/m2  WF  SKIN: warm and  Dry without overt rashes, tattoos and scars  HEAD and Neck: without JVD, Head and scalp: normal Eyes:No scleral icterus. Fundi normal, eye movements normal. Ears: Auricle normal, canal normal, Tympanic membranes normal, insufflation normal. Nose: normal Throat: normal Neck & thyroid: normal  CHEST & LUNGS: Chest wall: normal Lungs: Clear  CVS: Reveals the PMI to be normally located. Regular rhythm, First and Second Heart sounds are normal,  absence of murmurs, rubs or gallops. Peripheral vasculature: Radial pulses: normal Dorsal pedis pulses: normal Posterior pulses: normal  ABDOMEN:  Appearance: normal Benign, no organomegaly, no masses, no Abdominal Aortic enlargement. No Guarding , no rebound. No Bruits. Bowel sounds: normal  RECTAL: N/A GU: N/A  EXTREMETIES: nonedematous.  MUSCULOSKELETAL:  Spine: normal Joints:  intact  NEUROLOGIC: oriented to time,place and person; nonfocal. Strength is normal Sensory is normal Reflexes are normal Cranial Nerves are normal.  ASSESSMENT: HLD (hyperlipidemia) - Plan: fenofibrate 160 MG tablet  NSTEMI (non-ST elevated myocardial infarction)  Tobacco user  Kidney stone  Rectovaginal fistula  PLAN:  Since the BP is low will reduce theBeta blocker Smoking cessation: counseled and handout. Encouraged to follow through on the GYN referral and the cardiologist.  Counseled smoking cessation especially in view of her CAD and MI. Healthy lifestyle counselled.  Patient at this point declined any further intervention.  No orders of the defined types were placed in this encounter.   Meds ordered this encounter  Medications  . fenofibrate 160 MG tablet    Sig: Take 1 tablet (160 mg total) by mouth every morning.    Dispense:  90 tablet    Refill:  3   Medications Discontinued During This Encounter  Medication Reason  . ciprofloxacin (CIPRO) 500 MG tablet Completed Course  . fenofibrate 160 MG tablet Reorder   Return in about 4 weeks (around 05/02/2013) for Recheck medical problems, recheck BP.  Mckinze Poirier P. Modesto Charon, M.D.

## 2013-04-22 ENCOUNTER — Other Ambulatory Visit: Payer: Self-pay | Admitting: Family Medicine

## 2013-05-07 ENCOUNTER — Ambulatory Visit: Payer: Medicare Other | Admitting: Family Medicine

## 2013-06-04 ENCOUNTER — Ambulatory Visit (INDEPENDENT_AMBULATORY_CARE_PROVIDER_SITE_OTHER): Payer: Medicare HMO

## 2013-06-04 ENCOUNTER — Ambulatory Visit (INDEPENDENT_AMBULATORY_CARE_PROVIDER_SITE_OTHER): Payer: Medicare HMO | Admitting: Family Medicine

## 2013-06-04 ENCOUNTER — Encounter: Payer: Self-pay | Admitting: Family Medicine

## 2013-06-04 VITALS — BP 181/76 | HR 72 | Temp 97.2°F | Ht 61.0 in | Wt 140.0 lb

## 2013-06-04 DIAGNOSIS — M5412 Radiculopathy, cervical region: Secondary | ICD-10-CM

## 2013-06-04 DIAGNOSIS — N823 Fistula of vagina to large intestine: Secondary | ICD-10-CM

## 2013-06-04 DIAGNOSIS — F17200 Nicotine dependence, unspecified, uncomplicated: Secondary | ICD-10-CM

## 2013-06-04 DIAGNOSIS — R52 Pain, unspecified: Secondary | ICD-10-CM

## 2013-06-04 DIAGNOSIS — Z720 Tobacco use: Secondary | ICD-10-CM

## 2013-06-04 DIAGNOSIS — N824 Other female intestinal-genital tract fistulae: Secondary | ICD-10-CM

## 2013-06-04 DIAGNOSIS — I214 Non-ST elevation (NSTEMI) myocardial infarction: Secondary | ICD-10-CM

## 2013-06-04 DIAGNOSIS — N179 Acute kidney failure, unspecified: Secondary | ICD-10-CM

## 2013-06-04 DIAGNOSIS — E785 Hyperlipidemia, unspecified: Secondary | ICD-10-CM

## 2013-06-04 DIAGNOSIS — N200 Calculus of kidney: Secondary | ICD-10-CM

## 2013-06-04 DIAGNOSIS — M25519 Pain in unspecified shoulder: Secondary | ICD-10-CM

## 2013-06-04 MED ORDER — ACYCLOVIR 800 MG PO TABS: 800.0000 mg | ORAL_TABLET | Freq: Every day | ORAL | Status: DC

## 2013-06-04 NOTE — Progress Notes (Signed)
Patient ID: Alexa Key, female   DOB: [birth year], 63 y.o.   MRN: [id number] SUBJECTIVE: CC: Chief Complaint  Patient presents with  . Acute Visit    left shoulder paiin    HPI: Pain over the back of the left shoulder sharp lightening and the skin burns. No rashes. Symptoms for 1 week. No injury. Can move the shoulder with out problems.  Past Medical History  Diagnosis Date  . CAD (coronary artery disease)   . PVD (peripheral vascular disease)   . CVA (cerebral infarction)     TIAx2  . HTN (hypertension)   . HLD (hyperlipidemia)   . Tobacco abuse   . Diverticulitis 08/04/12  . S/P partial lobectomy of lung 1997    per [facility], incidental finding, path c/w benign lesion  . Stroke   . COPD (chronic obstructive pulmonary disease)   . Shortness of breath   . Fibromyalgia   . PONV (postoperative nausea and vomiting)   . Myocardial infarction     07/2012   . Anxiety   . Diabetes mellitus without complication     on no meds   . GERD (gastroesophageal reflux disease)    Past Surgical History  Procedure Laterality Date  . Hysterectomoy    . Lumbar spine surgery    . Carpal tunnel release    . Coronary angioplasty    . Coronary stents     . Partial lobectomy of lung     . Abdominal hysterectomy    . Back surgery    . Nephrolithotomy Right 12/24/2012    Procedure: NEPHROLITHOTOMY PERCUTANEOUS;  Surgeon: Franchot Gallo, MD;  Location: [facility];  Service: Urology;  Laterality: Right;   History   Social History  . Marital Status: Divorced    Spouse Name: N/A    Number of Children: N/A  . Years of Education: N/A   Occupational History  . Not on file.   Social History Main Topics  . Smoking status: Current Every Day Smoker -- 0.25 packs/day for 49 years    Types: Cigarettes  . Smokeless tobacco: Never Used  . Alcohol Use: No  . Drug Use: No  . Sexual Activity: Not Currently   Other Topics Concern  . Not on file   Social History Narrative  . No narrative on file    No family history on file. Current Outpatient Prescriptions on File Prior to Visit  Medication Sig Dispense Refill  . acetaminophen (TYLENOL) 500 MG tablet Take 1,000 mg by mouth every 6 (six) hours as needed for pain.      Marland Kitchen amLODipine (NORVASC) 5 MG tablet Take 1 tablet (5 mg total) by mouth every morning.  90 tablet  3  . aspirin 325 MG tablet Take 325 mg by mouth daily.      . budesonide-formoterol (SYMBICORT) 160-4.5 MCG/ACT inhaler Inhale 2 puffs into the lungs 2 (two) times daily.  1 Inhaler  12  . cholecalciferol (VITAMIN D) 1000 UNITS tablet Take 2,000 Units by mouth at bedtime.       . fenofibrate 160 MG tablet Take 1 tablet (160 mg total) by mouth every morning.  90 tablet  3  . losartan (COZAAR) 100 MG tablet Take 1 tablet (100 mg total) by mouth daily.  90 tablet  3  . metoprolol tartrate (LOPRESSOR) 25 MG tablet       . omeprazole (PRILOSEC) 20 MG capsule Take 20 mg by mouth daily.      Marland Kitchen omeprazole (PRILOSEC) 20 MG  capsule TAKE  ONE CAPSULE BY MOUTH EVERY MORNING  30 capsule  5  . pravastatin (PRAVACHOL) 80 MG tablet Take 1 tablet (80 mg total) by mouth at bedtime.  30 tablet  5   No current facility-administered medications on file prior to visit.   Allergies  Allergen Reactions  . Penicillins Rash  . Sulfa Antibiotics Rash   There is no immunization history for the selected administration types on file for this patient. Prior to Admission medications   Medication Sig Start Date End Date Taking? Authorizing Provider  acetaminophen (TYLENOL) 500 MG tablet Take 1,000 mg by mouth every 6 (six) hours as needed for pain.    Historical Provider, MD  amLODipine (NORVASC) 5 MG tablet Take 1 tablet (5 mg total) by mouth every morning. 01/17/13   Vernie Shanks, MD  aspirin 325 MG tablet Take 325 mg by mouth daily.    Historical Provider, MD  budesonide-formoterol (SYMBICORT) 160-4.5 MCG/ACT inhaler Inhale 2 puffs into the lungs 2 (two) times daily. 12/13/12   Vernie Shanks, MD   cholecalciferol (VITAMIN D) 1000 UNITS tablet Take 2,000 Units by mouth at bedtime.     Historical Provider, MD  fenofibrate 160 MG tablet Take 1 tablet (160 mg total) by mouth every morning. 04/04/13   Vernie Shanks, MD  losartan (COZAAR) 100 MG tablet Take 1 tablet (100 mg total) by mouth daily. 01/17/13   Vernie Shanks, MD  metoprolol tartrate (LOPRESSOR) 25 MG tablet  01/10/13   Vernie Shanks, MD  omeprazole (PRILOSEC) 20 MG capsule Take 20 mg by mouth daily.    Historical Provider, MD  omeprazole (PRILOSEC) 20 MG capsule TAKE  ONE CAPSULE BY MOUTH EVERY MORNING 04/22/13   Vernie Shanks, MD  pravastatin (PRAVACHOL) 80 MG tablet Take 1 tablet (80 mg total) by mouth at bedtime. 01/19/13   Vernie Shanks, MD     ROS: As above in the HPI. All other systems are stable or negative.  OBJECTIVE: APPEARANCE:  Patient in no acute distress.The patient appeared well nourished and normally developed. Acyanotic. Waist: VITAL SIGNS:BP 181/76  Pulse 72  Temp(Src) 97.2 F (36.2 C) (Oral)  Ht 5\' 1"  (1.549 m)  Wt 140 lb (63.504 kg)  BMI 26.47 kg/m2  WF SKIN: warm and  Dry without overt rashes, tattoos and scars Hypersensitivity over C6-C7 dermatome  HEAD and Neck: without JVD, Head and scalp: normal Eyes:No scleral icterus. Fundi normal, eye movements normal. Ears: Auricle normal, canal normal, Tympanic membranes normal, insufflation normal. Nose: normal Throat: normal Neck & thyroid: normal  CHEST & LUNGS: Chest wall: normal Lungs: Clear  CVS: Reveals the PMI to be normally located. Regular rhythm, First and Second Heart sounds are normal,  absence of murmurs, rubs or gallops. Peripheral vasculature: Radial pulses: normal Dorsal pedis pulses: normal Posterior pulses: normal  ABDOMEN:  Appearance: obese. Benign, no organomegaly, no masses, no Abdominal Aortic enlargement. No Guarding , no rebound. No Bruits. Bowel sounds: normal  RECTAL: N/A GU: N/A  EXTREMETIES:  nonedematous.  MUSCULOSKELETAL:  Spine: normal Joints: intact  NEUROLOGIC: oriented to time,place and person; nonfocal. Strength is normal Sensory is hypersensitive at C6-C7 Reflexes are normal Cranial Nerves are normal.  ASSESSMENT: Pain - Plan: DG Shoulder Left  Cervical radiculopathy at C7  Tobacco user - now using electronic cigarette.  NSTEMI (non-ST elevated myocardial infarction)  HLD (hyperlipidemia)  Rectovaginal fistula  Kidney stone  Acute renal failure Possibly SHINGLES   PLAN:  Orders Placed This  Encounter  Procedures  . DG Shoulder Left    Standing Status: Future     Number of Occurrences: 1     Standing Expiration Date: 08/04/2014    Order Specific Question:  Reason for Exam (SYMPTOM  OR DIAGNOSIS REQUIRED)    Answer:  pain    Order Specific Question:  Preferred imaging location?    Answer:  Internal  [facility] reading (PRIMARY) by  Dr. Jacelyn Grip: no acute findings.                               Handout on cervical radiculopathy.in th [facility].   Meds ordered this encounter  Medications  . acyclovir (ZOVIRAX) 800 MG tablet    Sig: Take 1 tablet (800 mg total) by mouth 5 (five) times daily.    Dispense:  35 tablet    Refill:  0   There are no discontinued medications. Return in about 1 week (around 06/11/2013) for Recheck medical problems.  Danial Hlavac P. Jacelyn Grip, M.D.

## 2013-06-04 NOTE — Patient Instructions (Signed)
Cervical Radiculopathy Cervical radiculopathy happens when a nerve in the neck is pinched or bruised by a slipped (herniated) disk or by arthritic changes in the bones of the cervical spine. This can occur due to an injury or as part of the normal aging process. Pressure on the cervical nerves can cause pain or numbness that runs from your neck all the way down into your arm and fingers. CAUSES  There are many possible causes, including:  Injury.  Muscle tightness in the neck from overuse.  Swollen, painful joints (arthritis).  Breakdown or degeneration in the bones and joints of the spine (spondylosis) due to aging.  Bone spurs that may develop near the cervical nerves. SYMPTOMS  Symptoms include pain, weakness, or numbness in the affected arm and hand. Pain can be severe or irritating. Symptoms may be worse when extending or turning the neck. DIAGNOSIS  Your caregiver will ask about your symptoms and do a physical exam. He or she may test your strength and reflexes. X-rays, CT scans, and MRI scans may be needed in cases of injury or if the symptoms do not go away after a period of time. Electromyography (EMG) or nerve conduction testing may be done to study how your nerves and muscles are working. TREATMENT  Your caregiver may recommend certain exercises to help relieve your symptoms. Cervical radiculopathy can, and often does, get better with time and treatment. If your problems continue, treatment options may include:  Wearing a soft collar for short periods of time.  Physical therapy to strengthen the neck muscles.  Medicines, such as nonsteroidal anti-inflammatory drugs (NSAIDs), oral corticosteroids, or spinal injections.  Surgery. Different types of surgery may be done depending on the cause of your problems. HOME CARE INSTRUCTIONS   Put ice on the affected area.  Put ice in a plastic bag.  Place a towel between your skin and the bag.  Leave the ice on for 15-20 minutes,  03-04 times a day or as directed by your caregiver.  If ice does not help, you can try using heat. Take a warm shower or bath, or use a hot water bottle as directed by your caregiver.  You may try a gentle neck and shoulder massage.  Use a flat pillow when you sleep.  Only take over-the-counter or prescription medicines for pain, discomfort, or fever as directed by your caregiver.  If physical therapy was prescribed, follow your caregiver's directions.  If a soft collar was prescribed, use it as directed. SEEK IMMEDIATE MEDICAL CARE IF:   Your pain gets much worse and cannot be controlled with medicines.  You have weakness or numbness in your hand, arm, face, or leg.  You have a high fever or a stiff, rigid neck.  You lose bowel or bladder control (incontinence).  You have trouble with walking, balance, or speaking. MAKE SURE YOU:   Understand these instructions.  Will watch your condition.  Will get help right away if you are not doing well or get worse. Document Released: 01/11/2001 Document Revised: 07/11/2011 Document Reviewed: 11/30/2010 [facility] Patient Information 2014 [place], [place].   Shingles Shingles (herpes zoster) is an infection that is caused by the same virus that causes chickenpox (varicella). The infection causes a painful skin rash and fluid-filled blisters, which eventually break open, crust over, and heal. It may occur in any area of the body, but it usually affects only one side of the body or face. The pain of shingles usually lasts about 1 month. However, some people  with shingles may develop long-term (chronic) pain in the affected area of the body. Shingles often occurs many years after the person had chickenpox. It is more common:  In people older than 50 years.  In people with weakened immune systems, such as those with HIV, AIDS, or cancer.  In people taking medicines that weaken the immune system, such as transplant medicines.  In people  under great stress. CAUSES  Shingles is caused by the varicella zoster virus (VZV), which also causes chickenpox. After a person is infected with the virus, it can remain in the person's body for years in an inactive state (dormant). To cause shingles, the virus reactivates and breaks out as an infection in a nerve root. The virus can be spread from person to person (contagious) through contact with open blisters of the shingles rash. It will only spread to people who have not had chickenpox. When these people are exposed to the virus, they may develop chickenpox. They will not develop shingles. Once the blisters scab over, the person is no longer contagious and cannot spread the virus to others. SYMPTOMS  Shingles shows up in stages. The initial symptoms may be pain, itching, and tingling in an area of the skin. This pain is usually described as burning, stabbing, or throbbing.In a few days or weeks, a painful red rash will appear in the area where the pain, itching, and tingling were felt. The rash is usually on one side of the body in a band or belt-like pattern. Then, the rash usually turns into fluid-filled blisters. They will scab over and dry up in approximately 2 3 weeks. Flu-like symptoms may also occur with the initial symptoms, the rash, or the blisters. These may include:  Fever.  Chills.  Headache.  Upset stomach. DIAGNOSIS  Your caregiver will perform a skin exam to diagnose shingles. Skin scrapings or fluid samples may also be taken from the blisters. This sample will be examined under a microscope or sent to a lab for further testing. TREATMENT  There is no specific cure for shingles. Your caregiver will likely prescribe medicines to help you manage the pain, recover faster, and avoid long-term problems. This may include antiviral drugs, anti-inflammatory drugs, and pain medicines. HOME CARE INSTRUCTIONS   Take a cool bath or apply cool compresses to the area of the rash or  blisters as directed. This may help with the pain and itching.   Only take over-the-counter or prescription medicines as directed by your caregiver.   Rest as directed by your caregiver.  Keep your rash and blisters clean with mild soap and cool water or as directed by your caregiver.  Do not pick your blisters or scratch your rash. Apply an anti-itch cream or numbing creams to the affected area as directed by your caregiver.  Keep your shingles rash covered with a loose bandage (dressing).  Avoid skin contact with:  Babies.   Pregnant women.   Children with eczema.   Elderly people with transplants.   People with chronic illnesses, such as leukemia or AIDS.   Wear loose-fitting clothing to help ease the pain of material rubbing against the rash.  Keep all follow-up appointments with your caregiver.If the area involved is on your face, you may receive a referral for follow-up to a specialist, such as an eye doctor (ophthalmologist) or an ear, nose, and throat (ENT) doctor. Keeping all follow-up appointments will help you avoid eye complications, chronic pain, or disability.  SEEK IMMEDIATE MEDICAL CARE IF:  You have facial pain, pain around the eye area, or loss of feeling on one side of your face.  You have ear pain or ringing in your ear.  You have loss of taste.  Your pain is not relieved with prescribed medicines.   Your redness or swelling spreads.   You have more pain and swelling.  Your condition is worsening or has changed.   You have a feveror persistent symptoms for more than 2 3 days.  You have a fever and your symptoms suddenly get worse. MAKE SURE YOU:  Understand these instructions.  Will watch your condition.  Will get help right away if you are not doing well or get worse. Document Released: 04/18/2005 Document Revised: 01/11/2012 Document Reviewed: 12/01/2011 [facility] Patient Information 2014 [place].

## 2013-06-06 ENCOUNTER — Telehealth: Payer: Self-pay | Admitting: Family Medicine

## 2013-06-06 ENCOUNTER — Other Ambulatory Visit: Payer: Self-pay | Admitting: Family Medicine

## 2013-06-06 DIAGNOSIS — B029 Zoster without complications: Secondary | ICD-10-CM

## 2013-06-06 DIAGNOSIS — M792 Neuralgia and neuritis, unspecified: Secondary | ICD-10-CM

## 2013-06-06 MED ORDER — HYDROCODONE-ACETAMINOPHEN 5-325 MG PO TABS: 1.0000 | ORAL_TABLET | Freq: Four times a day (QID) | ORAL | Status: DC | PRN

## 2013-06-06 NOTE — Telephone Encounter (Signed)
Rx ready for pick up. 

## 2013-06-07 NOTE — Telephone Encounter (Signed)
Patient notified that rx up front

## 2013-10-16 ENCOUNTER — Encounter: Payer: Self-pay | Admitting: Nurse Practitioner

## 2013-10-16 ENCOUNTER — Ambulatory Visit (INDEPENDENT_AMBULATORY_CARE_PROVIDER_SITE_OTHER): Payer: Medicare HMO | Admitting: Nurse Practitioner

## 2013-10-16 VITALS — BP 158/100 | HR 75 | Temp 99.4°F | Ht 61.0 in | Wt 140.0 lb

## 2013-10-16 DIAGNOSIS — I10 Essential (primary) hypertension: Secondary | ICD-10-CM | POA: Insufficient documentation

## 2013-10-16 DIAGNOSIS — N390 Urinary tract infection, site not specified: Secondary | ICD-10-CM

## 2013-10-16 DIAGNOSIS — R300 Dysuria: Secondary | ICD-10-CM

## 2013-10-16 LAB — POCT URINALYSIS DIPSTICK
BILIRUBIN UA: NEGATIVE
GLUCOSE UA: NEGATIVE
Ketones, UA: NEGATIVE
NITRITE UA: NEGATIVE
Spec Grav, UA: >=1.03
UROBILINOGEN UA: NEGATIVE
pH, UA: 5

## 2013-10-16 LAB — POCT UA - MICROSCOPIC ONLY
CRYSTALS, UR, HPF, POC: NEGATIVE
Casts, Ur, LPF, POC: NEGATIVE
Mucus, UA: NEGATIVE
Yeast, UA: NEGATIVE

## 2013-10-16 MED ORDER — AMLODIPINE BESYLATE 10 MG PO TABS: 10.0000 mg | ORAL_TABLET | Freq: Every day | ORAL | Status: DC

## 2013-10-16 MED ORDER — CIPROFLOXACIN HCL 500 MG PO TABS: 500.0000 mg | ORAL_TABLET | Freq: Two times a day (BID) | ORAL | Status: DC

## 2013-10-16 NOTE — Patient Instructions (Signed)
Hypertension Hypertension, commonly called high blood pressure, is when the force of blood pumping through your arteries is too strong. Your arteries are the blood vessels that carry blood from your heart throughout your body. A blood pressure reading consists of a higher number over a lower number, such as 110/72. The higher number (systolic) is the pressure inside your arteries when your heart pumps. The lower number (diastolic) is the pressure inside your arteries when your heart relaxes. Ideally you want your blood pressure below 120/80. Hypertension forces your heart to work harder to pump blood. Your arteries may become narrow or stiff. Having hypertension puts you at risk for heart disease, stroke, and other problems.  RISK FACTORS Some risk factors for high blood pressure are controllable. Others are not.  Risk factors you cannot control include:   Race. You may be at higher risk if you are African American.  Age. Risk increases with age.  Gender. Men are at higher risk than women before age 45 years. After age 65, women are at higher risk than men. Risk factors you can control include:  Not getting enough exercise or physical activity.  Being overweight.  Getting too much fat, sugar, calories, or salt in your diet.  Drinking too much alcohol. SIGNS AND SYMPTOMS Hypertension does not usually cause signs or symptoms. Extremely high blood pressure (hypertensive crisis) may cause headache, anxiety, shortness of breath, and nosebleed. DIAGNOSIS  To check if you have hypertension, your health care provider will measure your blood pressure while you are seated, with your arm held at the level of your heart. It should be measured at least twice using the same arm. Certain conditions can cause a difference in blood pressure between your right and left arms. A blood pressure reading that is higher than normal on one occasion does not mean that you need treatment. If one blood pressure reading  is high, ask your health care provider about having it checked again. TREATMENT  Treating high blood pressure includes making lifestyle changes and possibly taking medication. Living a healthy lifestyle can help lower high blood pressure. You may need to change some of your habits. Lifestyle changes may include:  Following the DASH diet. This diet is high in fruits, vegetables, and whole grains. It is low in salt, red meat, and added sugars.  Getting at least 2 1/2 hours of brisk physical activity every week.  Losing weight if necessary.  Not smoking.  Limiting alcoholic beverages.  Learning ways to reduce stress. If lifestyle changes are not enough to get your blood pressure under control, your health care provider may prescribe medicine. You may need to take more than one. Work closely with your health care provider to understand the risks and benefits. HOME CARE INSTRUCTIONS  Have your blood pressure rechecked as directed by your health care provider.   Only take medicine as directed by your health care provider. Follow the directions carefully. Blood pressure medicines must be taken as prescribed. The medicine does not work as well when you skip doses. Skipping doses also puts you at risk for problems.   Do not smoke.   Monitor your blood pressure at home as directed by your health care provider. SEEK MEDICAL CARE IF:   You think you are having a reaction to medicines taken.  You have recurrent headaches or feel dizzy.  You have swelling in your ankles.  You have trouble with your vision. SEEK IMMEDIATE MEDICAL CARE IF:  You develop a severe headache or   confusion.  You have unusual weakness, numbness, or feel faint.  You have severe chest or abdominal pain.  You vomit repeatedly.  You have trouble breathing. MAKE SURE YOU:   Understand these instructions.  Will watch your condition.  Will get help right away if you are not doing well or get  worse. Document Released: 04/18/2005 Document Revised: 04/23/2013 Document Reviewed: 02/08/2013 Exit[employer] Patient Information 2015 [employer]. This information is not intended to replace advice given to you by your health care provider. Make sure you discuss any questions you have with your health care provider.  

## 2013-10-16 NOTE — Addendum Note (Signed)
Addended by: Pollyann Kennedy F on: 10/16/2013 05:41 PM   Modules accepted: Orders

## 2013-10-16 NOTE — Progress Notes (Signed)
   Subjective:    Patient ID: GENI SKORUPSKI, female    DOB: 09-Dec-1950, 63 y.o.   MRN: [id number]  HPI Patient in c/o dysuria that started 3 days ago- has gotten worse- pelvic pressure started yesterday.    Review of Systems  Constitutional: Negative.   HENT: Negative.   Respiratory: Negative.   Cardiovascular: Negative.   Genitourinary: Positive for dysuria, urgency and frequency.  Neurological: Negative.   Psychiatric/Behavioral: Negative.   All other systems reviewed and are negative.      Objective:   Physical Exam  Constitutional: She is oriented to person, place, and time. She appears well-developed and well-nourished.  Cardiovascular: Normal rate, regular rhythm and normal heart sounds.   Pulmonary/Chest: Effort normal and breath sounds normal.  Abdominal: Soft. Bowel sounds are normal. She exhibits no distension. There is tenderness (mild suprapubic tenderness).  Genitourinary:  No CVA tenderness  Neurological: She is alert and oriented to person, place, and time.  Skin: Skin is warm and dry.  Psychiatric: She has a normal mood and affect. Her behavior is normal. Judgment and thought content normal.    BP 158/100  Pulse 75  Temp(Src) 99.4 F (37.4 C) (Oral)  Ht 5\' 1"  (1.549 m)  Wt 140 lb (63.504 kg)  BMI 26.47 kg/m2  Results for orders placed in visit on 01/22/13  POCT URINALYSIS DIPSTICK      Result Value Ref Range   Color, UA yellow     Clarity, UA cloudy     Glucose, UA neg     Bilirubin, UA neg     Ketones, UA neg     Spec Grav, UA 1.020     Blood, UA mod     pH, UA 5.0     Protein, UA neg     Urobilinogen, UA negative     Nitrite, UA neg     Leukocytes, UA moderate (2+)    POCT UA - MICROSCOPIC ONLY      Result Value Ref Range   WBC, Ur, HPF, POC 20-25     RBC, urine, microscopic 10-15     Bacteria, U Microscopic neg     Mucus, UA occ     Epithelial cells, urine per micros occ     Crystals, Ur, HPF, POC neg     Casts, Ur, LPF, POC neg     Yeast, UA neg           Assessment & Plan:  1. Dysuria  - POCT urinalysis dipstick - POCT UA - Microscopic Only  2. UTI (lower urinary tract infection) Force fluids AZO over the counter X2 days RTO prn Culture pending  - ciprofloxacin (CIPRO) 500 MG tablet; Take 1 tablet (500 mg total) by mouth 2 (two) times daily.  Dispense: 14 tablet; Refill: 0 - Urine culture  3. Hypertension Avoid salt in diet Increased amlodipine to 10 mg daily Let mne know if develop lower ext swelling - amLODipine (NORVASC) 10 MG tablet; Take 1 tablet (10 mg total) by mouth daily.  Dispense: 90 tablet; Refill: 1 CMP-NMR pending   Mary-Margaret Hassell Done, FNP

## 2013-10-18 ENCOUNTER — Telehealth: Payer: Self-pay | Admitting: *Deleted

## 2013-10-18 ENCOUNTER — Other Ambulatory Visit: Payer: Self-pay | Admitting: Nurse Practitioner

## 2013-10-18 ENCOUNTER — Telehealth: Payer: Self-pay | Admitting: Nurse Practitioner

## 2013-10-18 LAB — URINE CULTURE

## 2013-10-18 MED ORDER — SULFAMETHOXAZOLE-TMP DS 800-160 MG PO TABS: 1.0000 | ORAL_TABLET | Freq: Two times a day (BID) | ORAL | Status: DC

## 2013-10-18 MED ORDER — NITROFURANTOIN MONOHYD MACRO 100 MG PO CAPS: 100.0000 mg | ORAL_CAPSULE | Freq: Two times a day (BID) | ORAL | Status: DC

## 2013-10-18 NOTE — Telephone Encounter (Signed)
Message copied by Cline Crock on Fri Oct 18, 2013  3:53 PM ------      Message from: Chevis Pretty      Created: Fri Oct 18, 2013  1:49 PM       uti- given cipro- resistant- Stop cipro - macrobid  rx sent to pharmacy ------

## 2013-10-18 NOTE — Telephone Encounter (Signed)
Patient complaining that UTI medicine too costly.  Per Natiya Seelinger,  She is allergic to the other medications or the specimen is resistant to them.  She wanted [place] to ignore that sulfa is on her allergy list and prescribe it anyway.   Explained this was not an option.

## 2013-10-18 NOTE — Telephone Encounter (Signed)
Notified pt. 

## 2014-02-04 ENCOUNTER — Other Ambulatory Visit: Payer: Self-pay | Admitting: *Deleted

## 2014-02-04 MED ORDER — OMEPRAZOLE 20 MG PO CPDR: 20.0000 mg | DELAYED_RELEASE_CAPSULE | Freq: Every day | ORAL | Status: DC

## 2014-03-18 ENCOUNTER — Telehealth: Payer: Self-pay | Admitting: *Deleted

## 2014-03-18 ENCOUNTER — Other Ambulatory Visit: Payer: Self-pay | Admitting: *Deleted

## 2014-03-18 DIAGNOSIS — E785 Hyperlipidemia, unspecified: Secondary | ICD-10-CM

## 2014-03-18 NOTE — Telephone Encounter (Signed)
Aware,she needs labwork and appt. For refills.

## 2014-03-18 NOTE — Telephone Encounter (Signed)
Last labs 12/2012

## 2014-03-18 NOTE — Telephone Encounter (Signed)
Needs labs before filling statin

## 2014-05-08 ENCOUNTER — Ambulatory Visit (INDEPENDENT_AMBULATORY_CARE_PROVIDER_SITE_OTHER): Payer: Medicare HMO | Admitting: Family Medicine

## 2014-05-08 ENCOUNTER — Encounter: Payer: Self-pay | Admitting: Family Medicine

## 2014-05-08 VITALS — BP 163/83 | HR 104 | Temp 98.5°F | Ht 61.0 in | Wt 137.8 lb

## 2014-05-08 DIAGNOSIS — J206 Acute bronchitis due to rhinovirus: Secondary | ICD-10-CM

## 2014-05-08 MED ORDER — LEVALBUTEROL HCL 1.25 MG/0.5ML IN NEBU: 1.2500 mg | INHALATION_SOLUTION | Freq: Once | RESPIRATORY_TRACT | Status: DC

## 2014-05-08 MED ORDER — PREDNISONE 10 MG PO TABS: ORAL_TABLET | ORAL | Status: DC

## 2014-05-08 MED ORDER — LEVOFLOXACIN 500 MG PO TABS: 500.0000 mg | ORAL_TABLET | Freq: Every day | ORAL | Status: DC

## 2014-05-08 MED ORDER — LEVALBUTEROL HCL 1.25 MG/3ML IN NEBU
1.2500 mg | INHALATION_SOLUTION | Freq: Once | RESPIRATORY_TRACT | Status: AC
  Administered 2014-05-08: 1.25 mg via RESPIRATORY_TRACT

## 2014-05-08 NOTE — Addendum Note (Signed)
Addended by: Marin Olp on: 05/08/2014 05:30 PM   Modules accepted: Orders

## 2014-05-08 NOTE — Progress Notes (Signed)
   Subjective:    Patient ID: Alexa Key, female    DOB: 12-04-1950, 64 y.o.   MRN: [id number]  HPI Patient c/o persistent cough and uri sx's  Review of Systems  Constitutional: Negative for fever.  HENT: Negative for ear pain.   Eyes: Negative for discharge.  Respiratory: Negative for cough.   Cardiovascular: Negative for chest pain.  Gastrointestinal: Negative for abdominal distention.  Endocrine: Negative for polyuria.  Genitourinary: Negative for difficulty urinating.  Musculoskeletal: Negative for gait problem and neck pain.  Skin: Negative for color change and rash.  Neurological: Negative for speech difficulty and headaches.  Psychiatric/Behavioral: Negative for agitation.       Objective:    BP 163/83 mmHg  Pulse 104  Temp(Src) 98.5 F (36.9 C) (Oral)  Ht 5\' 1"  (1.549 m)  Wt 137 lb 12.8 oz (62.506 kg)  BMI 26.05 kg/m2  SpO2 93% Physical Exam  Constitutional: She is oriented to person, place, and time. She appears well-developed and well-nourished.  HENT:  Head: Normocephalic and atraumatic.  Mouth/Throat: Oropharynx is clear and moist.  Eyes: Pupils are equal, round, and reactive to light.  Neck: Normal range of motion. Neck supple.  Cardiovascular: Normal rate and regular rhythm.   No murmur heard. Pulmonary/Chest: Effort normal and breath sounds normal.  Abdominal: Soft. Bowel sounds are normal. There is no tenderness.  Neurological: She is alert and oriented to person, place, and time.  Skin: Skin is warm and dry.  Psychiatric: She has a normal mood and affect.          Assessment & Plan:     ICD-9-CM ICD-10-CM   1. Acute bronchitis due to Rhinovirus 466.0 J20.6 levofloxacin (LEVAQUIN) 500 MG tablet   079.3  predniSONE (DELTASONE) 10 MG tablet     levalbuterol (XOPENEX) nebulizer solution 1.25 mg    Push po fluids, rest, tylenol and motrin otc prn as directed for fever, arthralgias, and myalgias.  Follow up prn if sx's continue or persist. No  Follow-up on file.  Lysbeth Penner FNP

## 2014-05-19 ENCOUNTER — Ambulatory Visit (INDEPENDENT_AMBULATORY_CARE_PROVIDER_SITE_OTHER): Payer: Medicare HMO | Admitting: Family

## 2014-05-19 ENCOUNTER — Encounter: Payer: Self-pay | Admitting: Family

## 2014-05-19 VITALS — BP 152/81 | HR 60 | Temp 97.1°F | Ht 61.0 in | Wt 138.2 lb

## 2014-05-19 DIAGNOSIS — I10 Essential (primary) hypertension: Secondary | ICD-10-CM

## 2014-05-19 DIAGNOSIS — R300 Dysuria: Secondary | ICD-10-CM

## 2014-05-19 DIAGNOSIS — B3749 Other urogenital candidiasis: Secondary | ICD-10-CM

## 2014-05-19 DIAGNOSIS — N828 Other female genital tract fistulae: Secondary | ICD-10-CM

## 2014-05-19 LAB — POCT URINALYSIS DIPSTICK
Bilirubin, UA: NEGATIVE
Glucose, UA: NEGATIVE
Ketones, UA: NEGATIVE
Nitrite, UA: NEGATIVE
Spec Grav, UA: >=1.03
UROBILINOGEN UA: NEGATIVE
pH, UA: 7.5

## 2014-05-19 LAB — POCT UA - MICROSCOPIC ONLY
Bacteria, U Microscopic: NEGATIVE
CASTS, UR, LPF, POC: NEGATIVE
Crystals, Ur, HPF, POC: NEGATIVE

## 2014-05-19 MED ORDER — NITROFURANTOIN MONOHYD MACRO 100 MG PO CAPS: 100.0000 mg | ORAL_CAPSULE | Freq: Two times a day (BID) | ORAL | Status: DC

## 2014-05-19 MED ORDER — FLUCONAZOLE 150 MG PO TABS: 150.0000 mg | ORAL_TABLET | Freq: Once | ORAL | Status: DC

## 2014-05-19 MED ORDER — LOSARTAN POTASSIUM 100 MG PO TABS: 100.0000 mg | ORAL_TABLET | Freq: Every day | ORAL | Status: DC

## 2014-05-19 NOTE — Progress Notes (Signed)
   Subjective:    Patient ID: Alexa Key, female    DOB: Nov 21, 1950, 64 y.o.   MRN: [id number]  Abdominal Pain This is a new problem. The current episode started 1 to 4 weeks ago. The onset quality is gradual. The problem occurs constantly. The problem has been waxing and waning. The pain is located in the suprapubic region. The pain is at a severity of 8/10. The pain is mild. The quality of the pain is dull and cramping. Associated symptoms include diarrhea, dysuria, a fever and flatus. Pertinent negatives include no headaches, hematuria, nausea or vomiting. The pain is relieved by certain positions.   *Pt states when she wipes she has stool in her urine. Pt states she has been told she has had a fistula. States she hasn't had it fixed yet, but has been meaning to.    Review of Systems  Constitutional: Positive for fever.  HENT: Negative.   Eyes: Negative.   Respiratory: Negative.  Negative for shortness of breath.   Cardiovascular: Negative.  Negative for palpitations.  Gastrointestinal: Positive for abdominal pain, diarrhea and flatus. Negative for nausea and vomiting.  Endocrine: Negative.   Genitourinary: Positive for dysuria. Negative for hematuria.  Musculoskeletal: Negative.   Neurological: Negative.  Negative for headaches.  Hematological: Negative.   Psychiatric/Behavioral: Negative.   All other systems reviewed and are negative.      Objective:   Physical Exam  Constitutional: She is oriented to person, place, and time. She appears well-developed and well-nourished. No distress.  Cardiovascular: Normal rate, regular rhythm, normal heart sounds and intact distal pulses.   No murmur heard. Pulmonary/Chest: Effort normal and breath sounds normal. No respiratory distress. She has no wheezes.  Abdominal: Soft. Bowel sounds are normal. She exhibits no distension. There is no tenderness.  Musculoskeletal: Normal range of motion. She exhibits no edema or tenderness.  Neg for  CVA tenderness   Neurological: She is alert and oriented to person, place, and time. She has normal reflexes. No cranial nerve deficit.  Skin: Skin is warm and dry.  Psychiatric: She has a normal mood and affect. Her behavior is normal. Judgment and thought content normal.  Vitals reviewed.    BP 152/81 mmHg  Pulse 60  Temp(Src) 97.1 F (36.2 C) (Oral)  Ht 5\' 1"  (1.549 m)  Wt 138 lb 3.2 oz (62.687 kg)  BMI 26.13 kg/m2      Assessment & Plan:  1. 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 Candida UTI -Force fluids AZO over the counter X2 days RTO prn Culture pending - nitrofurantoin, macrocrystal-monohydrate, (MACROBID) 100 MG capsule; Take 1 capsule (100 mg total) by mouth 2 (two) times daily.  Dispense: 10 capsule; Refill: 0 - fluconazole (DIFLUCAN) 150 MG tablet; Take 1 tablet (150 mg total) by mouth once.  Dispense: 1 tablet; Refill: 0  Evelina Dun, FNP

## 2014-05-19 NOTE — Patient Instructions (Signed)

## 2014-05-21 ENCOUNTER — Telehealth: Payer: Self-pay | Admitting: Family

## 2014-05-21 LAB — URINE CULTURE

## 2014-05-21 NOTE — Telephone Encounter (Signed)
Continues to have back and abdominal pain.  Tylenol isn't helping. Wants to know if we can prescribe something.

## 2014-05-22 NOTE — Telephone Encounter (Signed)
Pt can take Tylenol and Motrin prn for pain. Pt needs to follow-up with urogynecology asap.

## 2014-05-22 NOTE — Telephone Encounter (Signed)
Patient aware.

## 2014-05-28 ENCOUNTER — Telehealth: Payer: Self-pay | Admitting: Family

## 2014-05-28 MED ORDER — CIPROFLOXACIN HCL 500 MG PO TABS: 500.0000 mg | ORAL_TABLET | Freq: Two times a day (BID) | ORAL | Status: DC

## 2014-05-28 NOTE — Telephone Encounter (Signed)
New RX sent to pharmacy.

## 2014-06-30 ENCOUNTER — Other Ambulatory Visit: Payer: Self-pay | Admitting: General Surgery

## 2014-06-30 DIAGNOSIS — N939 Abnormal uterine and vaginal bleeding, unspecified: Secondary | ICD-10-CM

## 2014-06-30 DIAGNOSIS — L988 Other specified disorders of the skin and subcutaneous tissue: Secondary | ICD-10-CM

## 2014-07-04 ENCOUNTER — Ambulatory Visit
Admission: RE | Admit: 2014-07-04 | Discharge: 2014-07-04 | Disposition: A | Discharge status: Home / Self Care | Payer: Commercial Managed Care - HMO | Source: Ambulatory Visit | Attending: General Surgery | Admitting: General Surgery

## 2014-07-04 MED ORDER — IOPAMIDOL (ISOVUE-300) INJECTION 61%
100.0000 mL | Freq: Once | INTRAVENOUS | Status: AC | PRN
  Administered 2014-07-04: 100 mL via INTRAVENOUS

## 2014-07-07 ENCOUNTER — Telehealth: Payer: Self-pay | Admitting: Family

## 2014-07-07 NOTE — Telephone Encounter (Signed)
Pt needs to be seen. Looks as if pt has been treated with several antibiotics and steroids for last month and half.

## 2014-07-07 NOTE — Telephone Encounter (Signed)
Stp advised of provider feedback, offered pt multiple appt times but pt declines saying she will talk to her daughter first and then call back if she wants appt.

## 2014-07-11 ENCOUNTER — Ambulatory Visit (HOSPITAL_COMMUNITY)
Admission: RE | Admit: 2014-07-11 | Discharge: 2014-07-11 | Disposition: A | Discharge status: Home / Self Care | Payer: Commercial Managed Care - HMO | Source: Ambulatory Visit | Attending: General Surgery | Admitting: General Surgery

## 2014-07-11 ENCOUNTER — Encounter (HOSPITAL_COMMUNITY)
Admission: RE | Disposition: A | Discharge status: Home / Self Care | Payer: Self-pay | Source: Ambulatory Visit | Attending: General Surgery

## 2014-07-11 ENCOUNTER — Encounter (HOSPITAL_COMMUNITY): Payer: Self-pay | Admitting: *Deleted

## 2014-07-11 DIAGNOSIS — I252 Old myocardial infarction: Secondary | ICD-10-CM | POA: Insufficient documentation

## 2014-07-11 DIAGNOSIS — K5660 Unspecified intestinal obstruction: Secondary | ICD-10-CM | POA: Insufficient documentation

## 2014-07-11 DIAGNOSIS — Z882 Allergy status to sulfonamides status: Secondary | ICD-10-CM | POA: Insufficient documentation

## 2014-07-11 DIAGNOSIS — N824 Other female intestinal-genital tract fistulae: Secondary | ICD-10-CM

## 2014-07-11 DIAGNOSIS — E119 Type 2 diabetes mellitus without complications: Secondary | ICD-10-CM | POA: Insufficient documentation

## 2014-07-11 DIAGNOSIS — Z1211 Encounter for screening for malignant neoplasm of colon: Secondary | ICD-10-CM | POA: Insufficient documentation

## 2014-07-11 DIAGNOSIS — K219 Gastro-esophageal reflux disease without esophagitis: Secondary | ICD-10-CM | POA: Insufficient documentation

## 2014-07-11 DIAGNOSIS — I10 Essential (primary) hypertension: Secondary | ICD-10-CM | POA: Diagnosis not present

## 2014-07-11 DIAGNOSIS — F17210 Nicotine dependence, cigarettes, uncomplicated: Secondary | ICD-10-CM | POA: Diagnosis not present

## 2014-07-11 DIAGNOSIS — Z90710 Acquired absence of both cervix and uterus: Secondary | ICD-10-CM | POA: Diagnosis not present

## 2014-07-11 DIAGNOSIS — E780 Pure hypercholesterolemia: Secondary | ICD-10-CM | POA: Diagnosis not present

## 2014-07-11 DIAGNOSIS — Z87442 Personal history of urinary calculi: Secondary | ICD-10-CM | POA: Insufficient documentation

## 2014-07-11 DIAGNOSIS — K5730 Diverticulosis of large intestine without perforation or abscess without bleeding: Secondary | ICD-10-CM | POA: Diagnosis not present

## 2014-07-11 DIAGNOSIS — J449 Chronic obstructive pulmonary disease, unspecified: Secondary | ICD-10-CM | POA: Insufficient documentation

## 2014-07-11 HISTORY — PX: FLEXIBLE SIGMOIDOSCOPY: SHX5431

## 2014-07-11 SURGERY — SIGMOIDOSCOPY, FLEXIBLE

## 2014-07-11 MED ORDER — FENTANYL CITRATE 0.05 MG/ML IJ SOLN
INTRAMUSCULAR | Status: DC | PRN
  Administered 2014-07-11 (×3): 25 ug via INTRAVENOUS

## 2014-07-11 MED ORDER — DIPHENHYDRAMINE HCL 50 MG/ML IJ SOLN
INTRAMUSCULAR | Status: AC
  Filled 2014-07-11: qty 1

## 2014-07-11 MED ORDER — DIPHENHYDRAMINE HCL 50 MG/ML IJ SOLN
INTRAMUSCULAR | Status: DC | PRN
  Administered 2014-07-11: 25 mg via INTRAVENOUS

## 2014-07-11 MED ORDER — FENTANYL CITRATE 0.05 MG/ML IJ SOLN
INTRAMUSCULAR | Status: AC
  Filled 2014-07-11: qty 2

## 2014-07-11 MED ORDER — MIDAZOLAM HCL 10 MG/2ML IJ SOLN
INTRAMUSCULAR | Status: DC | PRN
  Administered 2014-07-11: 1 mg via INTRAVENOUS
  Administered 2014-07-11: 2 mg via INTRAVENOUS
  Administered 2014-07-11: 1 mg via INTRAVENOUS
  Administered 2014-07-11 (×2): 2 mg via INTRAVENOUS

## 2014-07-11 MED ORDER — MIDAZOLAM HCL 10 MG/2ML IJ SOLN
INTRAMUSCULAR | Status: AC
  Filled 2014-07-11: qty 2

## 2014-07-11 MED ORDER — SODIUM CHLORIDE 0.9 % IV SOLN
INTRAVENOUS | Status: DC
  Administered 2014-07-11: 09:00:00 via INTRAVENOUS

## 2014-07-11 NOTE — H&P (Signed)
Van Clines. Russom 06/30/2014 10:14 AM Location: [facility] Patient #: [id number] DOB: 1950/07/22 Divorced / Language: [name] / Race: White Female  History of Present Illness Leighton Ruff MD; 0/86/5784 10:44 AM) Patient words: fitsula.  The patient is a 64 year old female who presents with a complaint of vaginal drainage. Patient presents to the office with 2 yrs of vaginal drainage and lower abd pain. She states that her Gyn felt that she may have a fistula. She has frequent UTI's as well. She is very anxious about treatment because she has had CV issues with medical treatment before. She takes an aspirin daily. She does have a history of diverticulitis. She has never had a colonoscopy. She denies any blood in her stools. Her BM's are regular. She denies any bowel habit changes or weight loss.   Other Problems Elbert Ewings, CMA; 06/30/2014 10:15 AM) Back Pain Cerebrovascular Accident Chronic Obstructive Lung Disease Diabetes Mellitus Gastroesophageal Reflux Disease General anesthesia - complications High blood pressure Hypercholesterolemia Kidney Stone Myocardial infarction Oophorectomy  Past Surgical History Elbert Ewings, CMA; 06/30/2014 10:15 AM) Carotid Artery Surgery Left. Hysterectomy (not due to cancer) - Complete Lung Surgery Right. Spinal Surgery - Lower Back  Diagnostic Studies History Elbert Ewings, [place]; 06/30/2014 10:15 AM) Mammogram >3 years ago Pap Smear 1-5 years ago  Allergies Elbert Ewings, CMA; 06/30/2014 10:15 AM) Sulfa 10 *OPHTHALMIC AGENTS*  Medication History Elbert Ewings, CMA; 06/30/2014 10:16 AM) Ciprofloxacin HCl (500MG  Tablet, Oral) Active. Fenofibrate (160MG  Tablet, Oral) Active. Fluconazole (150MG  Tablet, Oral) Active. Levofloxacin (500MG  Tablet, Oral) Active. Levofloxacin (750MG  Tablet, Oral) Active. Losartan Potassium (100MG  Tablet, Oral) Active. Nitrofurantoin Monohyd Macro (100MG  Capsule, Oral)  Active. Omeprazole (20MG  Capsule DR, Oral) Active. PredniSONE (10MG  Tablet, Oral) Active. Medications Reconciled  Social History Elbert Ewings, [place]; 06/30/2014 10:15 AM) Alcohol use Remotely quit alcohol use. Caffeine use Carbonated beverages, Coffee, Tea. No drug use Tobacco use Current every day smoker.  Family History Elbert Ewings, [place]; 06/30/2014 10:15 AM) Alcohol Abuse Father. Hypertension Daughter. Kidney Disease Mother. Respiratory Condition Sister.  Pregnancy / Birth History Elbert Ewings, CMA; 06/30/2014 10:15 AM) Age at menarche 38 years. Age of menopause <43 Gravida 2 Irregular periods Para 2  Review of Systems Elbert Ewings CMA; 06/30/2014 10:15 AM) General Not Present- Appetite Loss, Chills, Fatigue, Fever, Night Sweats, Weight Gain and Weight Loss. Skin Not Present- Change in Wart/Mole, Dryness, Hives, Jaundice, New Lesions, Non-Healing Wounds, Rash and Ulcer. HEENT Not Present- Earache, Hearing Loss, Hoarseness, Nose Bleed, Oral Ulcers, Ringing in the Ears, Seasonal Allergies, Sinus Pain, Sore Throat, Visual Disturbances, Wears glasses/contact lenses and Yellow Eyes. Breast Not Present- Breast Mass, Breast Pain, Nipple Discharge and Skin Changes. Cardiovascular Present- Shortness of Breath. Not Present- Chest Pain, Difficulty Breathing Lying Down, Leg Cramps, Palpitations, Rapid Heart Rate and Swelling of Extremities. Gastrointestinal Not Present- Abdominal Pain, Bloating, Bloody Stool, Change in Bowel Habits, Chronic diarrhea, Constipation, Difficulty Swallowing, Excessive gas, Gets full quickly at meals, Hemorrhoids, Indigestion, Nausea, Rectal Pain and Vomiting. Female Genitourinary Present- Frequency, Nocturia and Painful Urination. Not Present- Pelvic Pain and Urgency. Musculoskeletal Present- Back Pain. Not Present- Joint Pain, Joint Stiffness, Muscle Pain, Muscle Weakness and Swelling of Extremities. Neurological Present- Trouble walking. Not Present-  Decreased Memory, Fainting, Headaches, Numbness, Seizures, Tingling, Tremor and Weakness. Psychiatric Not Present- Anxiety, Bipolar, Change in Sleep Pattern, Depression, Fearful and Frequent crying. Endocrine Not Present- Cold Intolerance, Excessive Hunger, Hair Changes, Heat Intolerance, Hot flashes and New Diabetes. Hematology Present- Easy Bruising. Not Present- Excessive bleeding, Gland problems,  HIV and Persistent Infections.   Vitals Elbert Ewings CMA; 06/30/2014 10:17 AM) 06/30/2014 10:16 AM Weight: 140 lb Height: 61in Body Surface Area: 1.65 m Body Mass Index: 26.45 kg/m Temp.: 96.49F(Temporal)  Pulse: 60 (Regular)  Resp.: 15 (Unlabored)  BP: 132/70 (Sitting, Left Arm, Standard)    Physical Exam Leighton Ruff MD; 6/56/8127 10:43 AM) General Mental Status-Alert. General Appearance-Consistent with stated age. Hydration-Well hydrated. Voice-Normal.  Head and Neck Head-normocephalic, atraumatic with no lesions or palpable masses. Trachea-midline. Thyroid Gland Characteristics - normal size and consistency.  Eye Eyeball - Bilateral-Extraocular movements intact. Sclera/Conjunctiva - Bilateral-No scleral icterus.  Chest and Lung Exam Chest and lung exam reveals -quiet, even and easy respiratory effort with no use of accessory muscles and on auscultation, normal breath sounds, no adventitious sounds and normal vocal resonance. Inspection Chest Wall - Normal. Back - normal.  Cardiovascular Cardiovascular examination reveals -normal heart sounds, regular rate and rhythm with no murmurs and normal pedal pulses bilaterally.  Abdomen Inspection Inspection of the abdomen reveals - No Hernias. Palpation/Percussion Palpation and Percussion of the abdomen reveal - Soft, Non Tender, No Rebound tenderness, No Rigidity (guarding) and No hepatosplenomegaly. Auscultation Auscultation of the abdomen reveals - Bowel sounds normal.  Rectal Anorectal  Exam External - normal external exam. Internal - normal sphincter tone. Note: no distal fistula palpated.  Neurologic Neurologic evaluation reveals -alert and oriented x 3 with no impairment of recent or remote memory. Mental Status-Normal.  Musculoskeletal Global Assessment -Note: no gross deformities.  Normal Exam - Left-Upper Extremity Strength Normal and Lower Extremity Strength Normal. Normal Exam - Right-Upper Extremity Strength Normal and Lower Extremity Strength Normal.    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 Pt will also need colonoscopy for screening purposes prior to proceeding with any further treatment.  We have discussed this in detail, including a risk of bleeding and perforation.  She has agreed to proceed.

## 2014-07-11 NOTE — Op Note (Signed)
[facility] [address], [address]   FLEXIBLE SIGMOIDOSCOPY PROCEDURE REPORT  PATIENT: Alexa, Key  MR#: [id number] BIRTHDATE: 1950-05-08 , 68  yrs. old GENDER: female ENDOSCOPIST: Rosario Adie, MD REFERRED BY: PROCEDURE DATE:  07/11/2014 PROCEDURE:   Sigmoidoscopy, screening ASA CLASS:   Class II INDICATIONS:average risk for colon cancer. MEDICATIONS: Benadryl 25 mg IV, Fentanyl 75 mcg IV, and Versed 8 mg IV  DESCRIPTION OF PROCEDURE:   After the risks benefits and alternatives of the procedure were thoroughly explained, informed consent was obtained.  Digital exam revealed no abnormalities of the rectum. The Pentax Adult Colonscope [id number]  endoscope was introduced through the anus  and advanced to the sigmoid colon.  I was unable to travers a stricture at 24cm from the anal verge.  We switched to the Ultraslim scope and I was able to advance past the stricture enough to see the lumen on the other side but was unable to pass the scope though this.  The exam was terminated at this point.  There were no concerning findings for a malignant mass. The quality of the prep was adequate.  The instrument was then slowly withdrawn as the mucosa was fully examined.         COLON FINDINGS: There was moderate diverticulosis noted in the distal sigmoid colon.   Non traversable stenosis (narrowing) was found in the distal sigmoid colon.    Retroflexion was not performed.    The scope was then withdrawn from the patient and the procedure terminated.  COMPLICATIONS: There were no immediate complications.  ENDOSCOPIC IMPRESSION: 1.   Moderate diverticulosis was noted in the distal sigmoid colon 2.   Stenosis (narrowing) in the distal sigmoid colon. 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  Colonoscopy after surgical excision for screening purposes 2.  Surgery  REPEAT EXAM: In 4 months   for Colonoscopy.  eSigned:  Rosario Adie, MD 81/15/7262 10:28 AM   CC:

## 2014-07-11 NOTE — Discharge Instructions (Addendum)
Post Colonoscopy Instructions  1. DIET: Follow a light bland diet the first 24 hours after arrival home, such as soup, liquids, crackers, etc.  Be sure to include lots of fluids daily.  Avoid fast food or heavy meals as your are more likely to get nauseated.   2. You may have some mild rectal bleeding for the first few days after the procedure.  This should get less and less with time.  Resume any blood thinners 2 days after your procedure unless directed otherwise by your physician. 3. Take your usually prescribed home medications unless otherwise directed. a. If you have any pain, it is helpful to get up and walk around, as it is usually from excess gas. b. If this is not helpful, you can take an over-the-counter pain medication.  Choose one of the following that works best for you: i. Naproxen (Aleve, etc)  Two 220mg  tabs twice a day ii. Ibuprofen (Advil, etc) Three 200mg  tabs four times a day (every meal & bedtime) iii. If you still have pain after using one of these, please call the office 4. It is normal to not have a bowel movement for 2-3 days after colonoscopy.    5. ACTIVITIES as tolerated:   6. You may resume regular (light) daily activities beginning the next day--such as daily self-care, walking, climbing stairs--gradually increasing activities as tolerated.    WHEN TO CALL us [phone]: 1. Fever over 101.5 F (38.5 C)  2. Severe abdominal or chest pain  3. Large amount of rectal bleeding, passing multiple blood clots  4. Dizziness or shortness of breath 5. Increasing nausea or vomiting   The clinic staff is available to answer your questions during regular business hours (8:30am-5pm).  Please dont hesitate to call and ask to speak to one of our nurses for clinical concerns.   If you have a medical emergency, go to the nearest emergency room or call 911.  A surgeon from [facility] is always on call at the [facility], [place], [place], [place], [place]  [id number] ? MAIN: [phone] ? TOLL FREE: [phone] ?  FAX [phone] [email or web address]   Flexible Sigmoidoscopy, Care After Refer to this sheet in the next few weeks. These instructions provide you with information on caring for yourself after your procedure. Your health care provider may also give you more specific instructions. Your treatment has been planned according to current medical practices, but problems sometimes occur. Call your health care provider if you have any problems or questions after your procedure. WHAT TO EXPECT AFTER THE PROCEDURE After your procedure, it is typical to have the following:   Abdominal cramps.  Bloating.  A small amount of rectal bleeding if you had a biopsy. HOME CARE INSTRUCTIONS  Only take over-the-counter or prescription medicines for pain, fever, or discomfort as directed by your health care provider.  Resume your normal diet and activities as directed by your health care provider. SEEK MEDICAL CARE IF:  You have abdominal pain or cramping that lasts longer than 1 hour after the procedure.  You continue to have small amounts of rectal bleeding after 24 hours.  You have nausea or vomiting.  You feel weak or dizzy. SEEK IMMEDIATE MEDICAL CARE IF:   You have a fever.  You pass large blood clots or see a large amount of blood in the toilet after having a bowel movement. This may also occur 10-14 days after the procedure.  It is more likely if you had a biopsy.  You develop abdominal pain that is not relieved with medicine or your abdominal pain gets worse.  You have nausea or vomiting for more than 24 hours after the procedure. Document Released: 04/23/2013 Document Reviewed: 04/23/2013 [facility] Patient Information 2015 [place], [place]. This information is not intended to replace advice given to you by your health care provider. Make sure you discuss any questions you have  with your health care provider.  Conscious Sedation, Adult, Care After Refer to this sheet in the next few weeks. These instructions provide you with information on caring for yourself after your procedure. Your health care provider may also give you more specific instructions. Your treatment has been planned according to current medical practices, but problems sometimes occur. Call your health care provider if you have any problems or questions after your procedure. WHAT TO EXPECT AFTER THE PROCEDURE  After your procedure:  You may feel sleepy, clumsy, and have poor balance for several hours.  Vomiting may occur if you eat too soon after the procedure. HOME CARE INSTRUCTIONS  Do not participate in any activities where you could become injured for at least 24 hours. Do not:  Drive.  Swim.  Ride a bicycle.  Operate heavy machinery.  Cook.  Use power tools.  Climb ladders.  Work from a high place.  Do not make important decisions or sign legal documents until you are improved.  If you vomit, drink water, juice, or soup when you can drink without vomiting. Make sure you have little or no nausea before eating solid foods.  Only take over-the-counter or prescription medicines for pain, discomfort, or fever as directed by your health care provider.  Make sure you and your family fully understand everything about the medicines given to you, including what side effects may occur.  You should not drink alcohol, take sleeping pills, or take medicines that cause drowsiness for at least 24 hours.  If you smoke, do not smoke without supervision.  If you are feeling better, you may resume normal activities 24 hours after you were sedated.  Keep all appointments with your health care provider. SEEK MEDICAL CARE IF:  Your skin is pale or bluish in color.  You continue to feel nauseous or vomit.  Your pain is getting worse and is not helped by medicine.  You have bleeding or  swelling.  You are still sleepy or feeling clumsy after 24 hours. SEEK IMMEDIATE MEDICAL CARE IF:  You develop a rash.  You have difficulty breathing.  You develop any type of allergic problem.  You have a fever. MAKE SURE YOU:  Understand these instructions.  Will watch your condition.  Will get help right away if you are not doing well or get worse. Document Released: 02/06/2013 Document Reviewed: 02/06/2013 [facility] Patient Information 2015 [place], [place]. This information is not intended to replace advice given to you by your health care provider. Make sure you discuss any questions you have with your health care provider.

## 2014-07-14 ENCOUNTER — Telehealth: Payer: Self-pay | Admitting: Family

## 2014-07-14 ENCOUNTER — Encounter (HOSPITAL_COMMUNITY): Payer: Self-pay | Admitting: General Surgery

## 2014-07-14 DIAGNOSIS — B3749 Other urogenital candidiasis: Secondary | ICD-10-CM

## 2014-07-14 LAB — GLUCOSE, CAPILLARY: Glucose-Capillary: 121 mg/dL — ABNORMAL HIGH (ref 70–99)

## 2014-07-14 MED ORDER — FLUCONAZOLE 150 MG PO TABS: 150.0000 mg | ORAL_TABLET | Freq: Once | ORAL | Status: DC

## 2014-07-14 NOTE — Telephone Encounter (Signed)
Prescription sent to pharmacy.

## 2014-07-18 ENCOUNTER — Telehealth: Payer: Self-pay | Admitting: Cardiology

## 2014-07-18 NOTE — Telephone Encounter (Signed)
Tanya from Dr. Marcello Moores' office requesting surgical clearance for this patient. I confirmed our fax number. Apparently they had attempted to send but didn't have correct fax number on file.

## 2014-07-21 ENCOUNTER — Ambulatory Visit (INDEPENDENT_AMBULATORY_CARE_PROVIDER_SITE_OTHER): Payer: Commercial Managed Care - HMO | Admitting: Physician Assistant

## 2014-07-21 ENCOUNTER — Encounter: Payer: Self-pay | Admitting: Physician Assistant

## 2014-07-21 VITALS — BP 166/77 | HR 61 | Temp 98.4°F | Ht 61.0 in | Wt 137.6 lb

## 2014-07-21 DIAGNOSIS — N3090 Cystitis, unspecified without hematuria: Secondary | ICD-10-CM

## 2014-07-21 DIAGNOSIS — R300 Dysuria: Secondary | ICD-10-CM | POA: Diagnosis not present

## 2014-07-21 LAB — POCT URINALYSIS DIPSTICK
Bilirubin, UA: NEGATIVE
Glucose, UA: NEGATIVE
KETONES UA: NEGATIVE
Nitrite, UA: POSITIVE
PH UA: 6
SPEC GRAV UA: 1.01
Urobilinogen, UA: NEGATIVE

## 2014-07-21 LAB — POCT UA - MICROSCOPIC ONLY
CRYSTALS, UR, HPF, POC: NEGATIVE
Casts, Ur, LPF, POC: NEGATIVE
Yeast, UA: NEGATIVE

## 2014-07-21 MED ORDER — NITROFURANTOIN MONOHYD MACRO 100 MG PO CAPS: 100.0000 mg | ORAL_CAPSULE | Freq: Two times a day (BID) | ORAL | Status: DC

## 2014-07-21 NOTE — Progress Notes (Signed)
   Subjective:    Patient ID: Alexa Key, female    DOB: 1950-06-02, 64 y.o.   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    Review of Systems  Constitutional: Positive for chills. Negative for fever.  Respiratory: Positive for cough.   Genitourinary: Positive for dysuria, frequency and hematuria (2 weeks ago). Negative for flank pain, decreased urine volume and difficulty urinating.       Objective:   Physical Exam  Constitutional: She appears well-developed and well-nourished. No distress.  Obese   Cardiovascular: Normal rate, regular rhythm and normal heart sounds.  Exam reveals no gallop and no friction rub.   No murmur heard. Pulmonary/Chest: Effort normal. No respiratory distress. She has wheezes (expiratory LLL).  Abdominal: Soft. She exhibits no distension and no mass. There is no tenderness. There is no rebound and no guarding.  Skin: She is not diaphoretic.  Nursing note and vitals reviewed.         Assessment & Plan:

## 2014-07-22 NOTE — Telephone Encounter (Signed)
She will need to have an office visit before clearance.  We have not seen her since 2014.

## 2014-07-22 NOTE — Telephone Encounter (Signed)
Patient needs surgical clearance for "robiotic partial colectomy" under general anesthesia with Dr. Leighton Ruff of [facility]. - clearance was faxed on 07/11/14  Clearance should be faxed to [phone] Attn: Francee Nodal, RN  Phone [phone]  Message routed to Dr. Percival Spanish to review & advise on surgical clearance

## 2014-07-22 NOTE — Telephone Encounter (Signed)
Spoke with tonya, aware patient will need to be seen. Attempted to reach patient on cell and mailbox is full. Unable to reach pt or leave a message on home number.

## 2014-07-23 LAB — URINE CULTURE

## 2014-07-23 NOTE — Progress Notes (Signed)
   Subjective:    Patient ID: Alexa Key, female    DOB: 25-Nov-1950, 63 y.o.   MRN: [id number]  HPI    Review of Systems     Objective:   Physical Exam        Assessment & Plan:  1. Cystitis: Macrobid 100mg  BID x 5 days. Drink plenty of non caffeinated fluids. RTC if s/s persist after finishing abx.

## 2014-07-28 ENCOUNTER — Telehealth: Payer: Self-pay | Admitting: Physician Assistant

## 2014-07-28 NOTE — Telephone Encounter (Signed)
See notes under lab results. 

## 2014-07-30 ENCOUNTER — Telehealth: Payer: Self-pay | Admitting: Cardiology

## 2014-07-30 ENCOUNTER — Telehealth: Payer: Self-pay | Admitting: Family Medicine

## 2014-07-30 NOTE — Telephone Encounter (Signed)
Follow up scheduled 08-27-14. [facility] made aware

## 2014-07-30 NOTE — Telephone Encounter (Signed)
Received records from [facility] for appointment with Cecilie Kicks, NP on 08/27/14.  Records given to [employer] (medical records) for Laura's schedule on 08/27/14.  lp

## 2014-07-31 NOTE — Telephone Encounter (Signed)
Pt given appt tomorrow with [name] at 10:30.

## 2014-08-01 ENCOUNTER — Encounter: Payer: Self-pay | Admitting: Physician Assistant

## 2014-08-01 ENCOUNTER — Ambulatory Visit (INDEPENDENT_AMBULATORY_CARE_PROVIDER_SITE_OTHER): Payer: Commercial Managed Care - HMO | Admitting: Physician Assistant

## 2014-08-01 VITALS — BP 178/84 | HR 54 | Temp 98.1°F | Ht 61.0 in | Wt 136.0 lb

## 2014-08-01 DIAGNOSIS — R062 Wheezing: Secondary | ICD-10-CM

## 2014-08-01 MED ORDER — ALBUTEROL SULFATE HFA 108 (90 BASE) MCG/ACT IN AERS: 2.0000 | INHALATION_SPRAY | Freq: Four times a day (QID) | RESPIRATORY_TRACT | Status: DC | PRN

## 2014-08-01 NOTE — Patient Instructions (Signed)
Bronchospasm A bronchospasm is when the tubes that carry air in and out of your lungs (airways) spasm or tighten. During a bronchospasm it is hard to breathe. This is because the airways get smaller. A bronchospasm can be triggered by:  Allergies. These may be to animals, pollen, food, or mold.  Infection. This is a common cause of bronchospasm.  Exercise.  Irritants. These include pollution, cigarette smoke, strong odors, aerosol sprays, and paint fumes.  Weather changes.  Stress.  Being emotional. HOME CARE   Always have a plan for getting help. Know when to call your doctor and local emergency services (911 in the [place] Know where you can get emergency care.  Only take medicines as told by your doctor.  If you were prescribed an inhaler or nebulizer machine, ask your doctor how to use it correctly. Always use a spacer with your inhaler if you were given one.  Stay calm during an attack. Try to relax and breathe more slowly.  Control your home environment:  Change your heating and air conditioning filter at least once a month.  Limit your use of fireplaces and wood stoves.  Do not  smoke. Do not  allow smoking in your home.  Avoid perfumes and fragrances.  Get rid of pests (such as roaches and mice) and their droppings.  Throw away plants if you see mold on them.  Keep your house clean and dust free.  Replace carpet with wood, tile, or vinyl flooring. Carpet can trap dander and dust.  Use allergy-proof pillows, mattress covers, and box spring covers.  Wash bed sheets and blankets every week in hot water. Dry them in a dryer.  Use blankets that are made of polyester or cotton.  Wash hands frequently. GET HELP IF:  You have muscle aches.  You have chest pain.  The thick spit you spit or cough up (sputum) changes from clear or white to yellow, green, gray, or bloody.  The thick spit you spit or cough up gets thicker.  There are problems that may be related  to the medicine you are given such as:  A rash.  Itching.  Swelling.  Trouble breathing. GET HELP RIGHT AWAY IF:  You feel you cannot breathe or catch your breath.  You cannot stop coughing.  Your treatment is not helping you breathe better.  You have very bad chest pain. MAKE SURE YOU:   Understand these instructions.  Will watch your condition.  Will get help right away if you are not doing well or get worse. Document Released: 02/13/2009 Document Revised: 04/23/2013 Document Reviewed: 10/09/2012 [employer] Patient Information 2015 [employer]. This information is not intended to replace advice given to you by your health care provider. Make sure you discuss any questions you have with your health care provider.  

## 2014-08-01 NOTE — Progress Notes (Signed)
   Subjective:    Patient ID: Alexa Key, female    DOB: 18-Mar-1951, 64 y.o.   MRN: [id number]  HPI 64 y/o female presents with c/oproductive cough x 1 week. Recently took Finzel for cystitis. Has been using Symbicort as directed  With some relief. Does not have a SABA. Has smoked 2 cigarettes today but states that she is trying to quit     Review of Systems  Constitutional: Negative.   HENT: Positive for congestion (chest). Negative for postnasal drip.   Respiratory: Positive for cough (productive), chest tightness and shortness of breath (increased over the past week ). Negative for apnea, choking and wheezing.   Cardiovascular: Negative.        Objective:   Physical Exam  Constitutional: She appears well-developed and well-nourished. No distress.  Cardiovascular: Normal rate, regular rhythm and normal heart sounds.  Exam reveals no gallop and no friction rub.   No murmur heard. Pulmonary/Chest: Effort normal and breath sounds normal. No respiratory distress. She has no wheezes.  Neurological: She is alert.  Skin: She is not diaphoretic.  Psychiatric: She has a normal mood and affect. Her behavior is normal. Judgment and thought content normal.  Nursing note and vitals reviewed.         Assessment & Plan:  1. COPD exacerbation : Add SABA Albuterol to medication regimen. Strongly advised patient to stop smoking. Due to O2 sat being stable at 94-95%, I do not feel that antibiotic is needed today. Advised patient to f/u in clinic or ED if s/s worsen, increased SOB occurs. Otherwise, f/u in 1 week for recheck.

## 2014-08-11 ENCOUNTER — Ambulatory Visit (INDEPENDENT_AMBULATORY_CARE_PROVIDER_SITE_OTHER): Payer: Commercial Managed Care - HMO | Admitting: Physician Assistant

## 2014-08-11 ENCOUNTER — Encounter: Payer: Self-pay | Admitting: Physician Assistant

## 2014-08-11 VITALS — BP 166/72 | HR 50 | Temp 97.2°F | Ht 61.0 in | Wt 142.0 lb

## 2014-08-11 DIAGNOSIS — J441 Chronic obstructive pulmonary disease with (acute) exacerbation: Secondary | ICD-10-CM

## 2014-08-11 DIAGNOSIS — J449 Chronic obstructive pulmonary disease, unspecified: Secondary | ICD-10-CM

## 2014-08-11 NOTE — Progress Notes (Signed)
   Subjective:    Patient ID: Alexa Key, female    DOB: 1950/11/04, 64 y.o.   MRN: [id number]  HPI 64 y/o female presents for recheck of COPD exacerbation. She is feeling better, however she is still having SOB with activities. She continues to use the Symbicort BID and Albuterol q 6 hours. She is slightly bradycardic today, which I have advised her to f/u with her cardiologist to discuss possible change in her medicaiton since this could be due to her betablocker.     Review of Systems     Objective:   Physical Exam  Constitutional: She is oriented to person, place, and time. She appears well-developed and well-nourished. No distress.  Cardiovascular: Normal rate, regular rhythm, normal heart sounds and intact distal pulses.  Exam reveals no gallop and no friction rub.   No murmur heard. Pulmonary/Chest: Effort normal and breath sounds normal. No respiratory distress. She has no wheezes. She has no rales. She exhibits no tenderness.  BS slightly decreased bilaterally on expiration but no auditory wheezing. Partial lobectomy on RLL   Musculoskeletal: Normal range of motion.  Neurological: She is alert and oriented to person, place, and time. She has normal reflexes.  Skin: She is not diaphoretic.  Nursing note and vitals reviewed.         Assessment & Plan:  1. COPD Exacerbation: Symptoms much improved with Symbicort BID and albuterol q 6 hours. Patient feels that albuterol has increased her bp so I have advised her that she can use it prn. However, if she continues to have exacerbations, I advised her that it would be beneficial for a pulmonology referral.   2. Bradycardia: Patient states that this is not normal for her so I have advised her to report this to her Cardiologist on her appt on 08/27/14.   F/U if s/s worsen or dni.

## 2014-08-27 ENCOUNTER — Encounter: Payer: Self-pay | Admitting: Cardiology

## 2014-08-27 ENCOUNTER — Ambulatory Visit (INDEPENDENT_AMBULATORY_CARE_PROVIDER_SITE_OTHER): Payer: Commercial Managed Care - HMO | Admitting: Cardiology

## 2014-08-27 VITALS — BP 134/80 | HR 44 | Ht 61.0 in | Wt 142.0 lb

## 2014-08-27 DIAGNOSIS — Z01818 Encounter for other preprocedural examination: Secondary | ICD-10-CM

## 2014-08-27 NOTE — Patient Instructions (Signed)
Your physician wants you to follow-up in: 6 Months Dr Myer Peer will receive a reminder letter in the mail two months in advance. If you don't receive a letter, please call our office to schedule the follow-up appointment.  Your physician has requested that you have a lexiscan myoview. For further information please visit [email or web address]. Please follow instruction sheet, as given. 2 Weeks  Your physician recommends that you schedule a follow-up appointment in: 1 Week for EKG check on nurse schedule  Your physician has recommended you make the following change in your medication: Decrease Metoprolol to 12.5 mg(1/2) tablets daily.

## 2014-08-27 NOTE — Progress Notes (Signed)
Cardiology Office Note   Date:  08/27/2014   ID:  Alexa Key, DOB 08-10-1950, MRN [id number]  PCP:  [name], PA-C  Cardiologist:  Dr. Percival Spanish   Chief Complaint  Patient presents with  . Bradycardia    pt is having Recto Vaginal fistua repairl, no date as yet need cardiac clearance, pt c/o slow heart rate, pt denied chest pain and SOB      History of Present Illness: Alexa Key is a 64 y.o. female who presents for bradycardia and hx CAD with remote angioplasty at [facility]..    Last seen 2014 by Dr. Percival Spanish.  Here today for surgical clearance for robiotic partial colectomy with Dr. Leighton Ruff under general anesthesia and her bradycardia at 44 today.  In the past her HR was low on current dose of BB and decreased with rise in HR.  She is no back on higher dose of BB and HR in the 40s.  She feels tired all the time with occ lightheadedness. She denies chest pain, +DOE.  She does not do much activity due to SOB.  She has stopped smoking 2 weeks ago, cold [place].  She was congratulated.   Her last nuc was 2014.      Past Medical History  Diagnosis Date  . CAD (coronary artery disease)   . PVD (peripheral vascular disease)   . CVA (cerebral infarction)     TIAx2  . HTN (hypertension)   . HLD (hyperlipidemia)   . Tobacco abuse   . Diverticulitis 08/04/12  . S/P partial lobectomy of lung 1997    per [facility], incidental finding, path c/w benign lesion  . Stroke   . COPD (chronic obstructive pulmonary disease)   . Shortness of breath   . Fibromyalgia   . PONV (postoperative nausea and vomiting)   . Anxiety   . Diabetes mellitus without complication     on no meds   . GERD (gastroesophageal reflux disease)   . Myocardial infarction     07/2012     Past Surgical History  Procedure Laterality Date  . Hysterectomoy    . Lumbar spine surgery    . Carpal tunnel release    . Coronary angioplasty    . Coronary stents     . Partial lobectomy of lung     . Abdominal  hysterectomy    . Back surgery    . Nephrolithotomy Right 12/24/2012    Procedure: NEPHROLITHOTOMY PERCUTANEOUS;  Surgeon: Franchot Gallo, MD;  Location: [facility] ORS;  Service: Urology;  Laterality: Right;  . Flexible sigmoidoscopy  07/11/2014    Procedure: FLEXIBLE SIGMOIDOSCOPY;  Surgeon: Leighton Ruff, MD;  Location: [facility] ENDOS[facility];  Service: Endoscopy;;     Current Outpatient Prescriptions  Medication Sig Dispense Refill  . acetaminophen (TYLENOL) 500 MG tablet Take 1,000 mg by mouth every 6 (six) hours as needed for pain.    Marland Kitchen albuterol (PROVENTIL HFA;VENTOLIN HFA) 108 (90 BASE) MCG/ACT inhaler Inhale 2 puffs into the lungs every 6 (six) hours as needed for wheezing or shortness of breath. 1 Inhaler 6  . amLODipine (NORVASC) 10 MG tablet Take 1 tablet (10 mg total) by mouth daily. 90 tablet 1  . aspirin 325 MG tablet Take 325 mg by mouth daily.    . budesonide-formoterol (SYMBICORT) 160-4.5 MCG/ACT inhaler Inhale 2 puffs into the lungs 2 (two) times daily. 1 Inhaler 12  . cholecalciferol (VITAMIN D) 1000 UNITS tablet Take 2,000 Units by mouth at bedtime.     Marland Kitchen  fenofibrate 160 MG tablet Take 1 tablet (160 mg total) by mouth every morning. 90 tablet 3  . HYDROcodone-acetaminophen (NORCO/VICODIN) 5-325 MG per tablet Take 1 tablet by mouth every 6 (six) hours as needed for moderate pain. 30 tablet 0  . losartan (COZAAR) 100 MG tablet Take 1 tablet (100 mg total) by mouth daily. 90 tablet 3  . metoprolol tartrate (LOPRESSOR) 25 MG tablet Take 12.5 mg by mouth daily.     Marland Kitchen omeprazole (PRILOSEC) 20 MG capsule Take 1 capsule (20 mg total) by mouth daily. 30 capsule 2  . pravastatin (PRAVACHOL) 80 MG tablet Take 1 tablet (80 mg total) by mouth at bedtime. 30 tablet 5   No current facility-administered medications for this visit.    Allergies:   Penicillins and Sulfa antibiotics    Social History:  The patient  reports that she quit smoking about 2 weeks ago. Her smoking use included Cigarettes.  She has a 12.25 pack-year smoking history. She has never used smokeless tobacco. She reports that she does not drink alcohol or use illicit drugs.   Family History:  The patient's family history includes Kidney disease in her mother.    ROS:  General:no colds or fevers, no weight changes Skin:no rashes or ulcers HEENT:no blurred vision, no congestion CV:see HPI PUL:see HPI GI:no diarrhea constipation or melena, no indigestion GU:no hematuria, no dysuria MS:no joint pain, no claudication Neuro:no syncope, occ. lightheadedness Endo:no diabetes, no thyroid disease  Wt Readings from Last 3 Encounters:  08/27/14 142 lb (64.411 kg)  08/11/14 142 lb (64.411 kg)  08/01/14 136 lb (61.689 kg)     PHYSICAL EXAM: VS:  BP 134/80 mmHg  Pulse 44  Ht 5\' 1"  (1.549 m)  Wt 142 lb (64.411 kg)  BMI 26.84 kg/m2 , BMI Body mass index is 26.84 kg/(m^2). General:Pleasant affect, NAD Skin:Warm and dry, brisk capillary refill HEENT:normocephalic, sclera clear, mucus membranes moist Neck:supple, no JVD, no bruits  Heart:S1S2 slow RRR with soft systolic murmur, no gallup, rub or click Lungs:clear without rales, rhonchi, or wheezes JOA:CZYS, non tender, + BS, do not palpate liver spleen or masses Ext:no lower ext edema, 2+ pedal pulses, 2+ radial pulses Neuro:alert and oriented X 3, MAE, follows commands, + facial symmetry    EKG:  EKG is ordered today. The ekg ordered today demonstrates SB at 44 rate slower and with LVH. No acte changes otherwise.    Recent Labs: No results found for requested labs within last 365 days.    Lipid Panel    Component Value Date/Time   CHOL 159 01/17/2013 1703   TRIG 182* 01/17/2013 1703   TRIG 321* 08/08/2012 0147   HDL 41 01/17/2013 1703   HDL 4* 08/08/2012 0147   CHOLHDL 21.3 08/08/2012 0147   VLDL 64* 08/08/2012 0147   LDLCALC 82 01/17/2013 1703   LDLCALC 17 08/08/2012 0147       Other studies Reviewed: Additional studies/ records that were  reviewed today include: previous EKGs, labs, notes.   ASSESSMENT AND PLAN:  1.  Sinus brady  decrease BB to 12.5 mg daily and follow up EKG in 1 week - if HR stable will change to toprol XL 12.5 mg daily.  If rate still low will stop the BB.  Her chronic lack of energy could be related to HR.   2. CAD with hx remote angioplasty.  Pt with DOE but is not very active either.  We will proceed with lexiscan myoview to determine if cleared for surgery.  Will discuss with Dr. Percival Spanish once study complete.    3.  HTN  Stable  4. Tobacco use.  She stopped 2 weeks ago.      Current medicines are reviewed with the patient today.  The patient Has no concerns regarding medicines.  The following changes have been made:  See above Labs/ tests ordered today include:see above  Disposition:   FU:  see above  Lennie Muckle, NP  08/27/2014 12:46 PM    [facility] [place], [place], [place] [name], [place] Phone: [phone]; Fax: [phone]

## 2014-08-28 ENCOUNTER — Other Ambulatory Visit (HOSPITAL_COMMUNITY): Payer: Self-pay | Admitting: Cardiovascular Disease

## 2014-09-03 ENCOUNTER — Ambulatory Visit (INDEPENDENT_AMBULATORY_CARE_PROVIDER_SITE_OTHER): Payer: Commercial Managed Care - HMO | Admitting: *Deleted

## 2014-09-03 VITALS — HR 70

## 2014-09-03 DIAGNOSIS — I10 Essential (primary) hypertension: Secondary | ICD-10-CM | POA: Diagnosis not present

## 2014-09-03 DIAGNOSIS — Z9189 Other specified personal risk factors, not elsewhere classified: Secondary | ICD-10-CM | POA: Diagnosis not present

## 2014-09-03 DIAGNOSIS — E785 Hyperlipidemia, unspecified: Secondary | ICD-10-CM | POA: Diagnosis not present

## 2014-09-03 DIAGNOSIS — Z9289 Personal history of other medical treatment: Secondary | ICD-10-CM | POA: Diagnosis not present

## 2014-09-03 NOTE — Patient Instructions (Signed)
Next patient encounter scheduled is stres test, set for 5/11 at 8:15am. Please keep appointment for this, contact our office if any questions or concerns.

## 2014-09-03 NOTE — Progress Notes (Signed)
Pt returned for 12-lead EKG check today. Dr. Debara Pickett saw today's EKG, compared to last week's. Med chgs noted. EKG signed by physician.

## 2014-09-04 ENCOUNTER — Ambulatory Visit: Payer: Commercial Managed Care - HMO | Admitting: Cardiology

## 2014-09-05 ENCOUNTER — Telehealth (HOSPITAL_COMMUNITY): Payer: Self-pay

## 2014-09-05 NOTE — Telephone Encounter (Signed)
Encounter complete. 

## 2014-09-09 ENCOUNTER — Telehealth (HOSPITAL_COMMUNITY): Payer: Self-pay

## 2014-09-09 NOTE — Addendum Note (Signed)
Addended by: Diana Eves on: 09/09/2014 01:36 PM   Modules accepted: Orders

## 2014-09-09 NOTE — Telephone Encounter (Signed)
Encounter complete. 

## 2014-09-10 ENCOUNTER — Ambulatory Visit (HOSPITAL_COMMUNITY)
Admission: RE | Admit: 2014-09-10 | Discharge: 2014-09-10 | Disposition: A | Discharge status: Home / Self Care | Payer: Commercial Managed Care - HMO | Source: Ambulatory Visit | Attending: Internal Medicine | Admitting: Internal Medicine

## 2014-09-10 DIAGNOSIS — Z01818 Encounter for other preprocedural examination: Secondary | ICD-10-CM

## 2014-09-10 LAB — MYOCARDIAL PERFUSION IMAGING
CHL CUP NUCLEAR SDS: 8
CHL CUP NUCLEAR SSS: 10
CHL CUP STRESS STAGE 1 SBP: 195 mmHg
CHL CUP STRESS STAGE 2 GRADE: 0 %
CHL CUP STRESS STAGE 3 GRADE: 0 %
CHL CUP STRESS STAGE 3 HR: 85 {beats}/min
CHL CUP STRESS STAGE 4 GRADE: 0 %
CHL CUP STRESS STAGE 4 HR: 75 {beats}/min
CHL CUP STRESS STAGE 4 SPEED: 0 mph
CSEPEW: 1 METS
LVDIAVOL: 86 mL
LVSYSVOL: 41 mL
Nuc Stress EF: 52 %
Peak HR: 85 {beats}/min
Percent of predicted max HR: 55 %
Rest HR: 73 {beats}/min
Stage 1 DBP: 83 mmHg
Stage 1 Grade: 0 %
Stage 1 HR: 73 {beats}/min
Stage 1 Speed: 0 mph
Stage 2 HR: 73 {beats}/min
Stage 2 Speed: 0 mph
Stage 3 Speed: 0 mph
Stage 4 DBP: 80 mmHg
Stage 4 SBP: 180 mmHg
TID: 1.23

## 2014-09-10 MED ORDER — AMINOPHYLLINE 25 MG/ML IV SOLN
75.0000 mg | Freq: Once | INTRAVENOUS | Status: AC
Start: 2014-09-10 — End: 2014-09-10
  Administered 2014-09-10: 75 mg via INTRAVENOUS

## 2014-09-10 MED ORDER — TECHNETIUM TC 99M SESTAMIBI GENERIC - CARDIOLITE
30.6000 | Freq: Once | INTRAVENOUS | Status: AC | PRN
  Administered 2014-09-10: 31 via INTRAVENOUS

## 2014-09-10 MED ORDER — TECHNETIUM TC 99M SESTAMIBI GENERIC - CARDIOLITE
10.9000 | Freq: Once | INTRAVENOUS | Status: AC | PRN
  Administered 2014-09-10: 10.9 via INTRAVENOUS

## 2014-09-10 MED ORDER — REGADENOSON 0.4 MG/5ML IV SOLN
0.4000 mg | Freq: Once | INTRAVENOUS | Status: AC
  Administered 2014-09-10: 0.4 mg via INTRAVENOUS

## 2014-09-12 ENCOUNTER — Other Ambulatory Visit: Payer: Self-pay | Admitting: Nurse Practitioner

## 2014-09-14 NOTE — Progress Notes (Signed)
Abnormal stress test.  I have to have her added to my schedule this week to discuss further preop clearance.

## 2014-09-15 ENCOUNTER — Other Ambulatory Visit: Payer: Self-pay | Admitting: Physician Assistant

## 2014-09-15 ENCOUNTER — Other Ambulatory Visit: Payer: Self-pay

## 2014-09-15 MED ORDER — VITAMIN D 1000 UNITS PO TABS: 2000.0000 [IU] | ORAL_TABLET | Freq: Every day | ORAL | Status: DC

## 2014-09-15 NOTE — Telephone Encounter (Signed)
Last seen 08/01/14 Alexa Key  Last lipid 01/17/13

## 2014-09-15 NOTE — Telephone Encounter (Signed)
Prescription sent to pharmacy  Xerxes Agrusa A. Quinette Hentges PA-C   

## 2014-09-16 ENCOUNTER — Telehealth: Payer: Self-pay | Admitting: Cardiology

## 2014-09-17 NOTE — Telephone Encounter (Signed)
Closed encounter °

## 2014-09-18 ENCOUNTER — Other Ambulatory Visit: Payer: Self-pay

## 2014-09-18 ENCOUNTER — Ambulatory Visit: Payer: Commercial Managed Care - HMO | Admitting: Cardiology

## 2014-09-18 DIAGNOSIS — E785 Hyperlipidemia, unspecified: Secondary | ICD-10-CM

## 2014-09-18 NOTE — Telephone Encounter (Signed)
Pt notified she needs labwork Verbalizes understanding

## 2014-09-18 NOTE — Telephone Encounter (Signed)
Last seen 08/11/14  Alexa Key  Last lipid 12/2012

## 2014-09-18 NOTE — Telephone Encounter (Signed)
Denied. Patient has not had lipid panel or refills since 2014. Needs f/u labs prior to med refill  Alexa Key A. Benjamin Stain PA-C

## 2014-09-22 ENCOUNTER — Ambulatory Visit (INDEPENDENT_AMBULATORY_CARE_PROVIDER_SITE_OTHER): Payer: Commercial Managed Care - HMO | Admitting: Cardiology

## 2014-09-22 ENCOUNTER — Encounter: Payer: Self-pay | Admitting: Cardiology

## 2014-09-22 VITALS — BP 158/74 | HR 72 | Ht 61.0 in | Wt 143.4 lb

## 2014-09-22 DIAGNOSIS — Z79899 Other long term (current) drug therapy: Secondary | ICD-10-CM | POA: Diagnosis not present

## 2014-09-22 DIAGNOSIS — R931 Abnormal findings on diagnostic imaging of heart and coronary circulation: Secondary | ICD-10-CM | POA: Diagnosis not present

## 2014-09-22 DIAGNOSIS — R9439 Abnormal result of other cardiovascular function study: Secondary | ICD-10-CM

## 2014-09-22 DIAGNOSIS — I2510 Atherosclerotic heart disease of native coronary artery without angina pectoris: Secondary | ICD-10-CM | POA: Insufficient documentation

## 2014-09-22 DIAGNOSIS — Z7901 Long term (current) use of anticoagulants: Secondary | ICD-10-CM | POA: Diagnosis not present

## 2014-09-22 DIAGNOSIS — R5383 Other fatigue: Secondary | ICD-10-CM

## 2014-09-22 DIAGNOSIS — I10 Essential (primary) hypertension: Secondary | ICD-10-CM

## 2014-09-22 DIAGNOSIS — Z720 Tobacco use: Secondary | ICD-10-CM

## 2014-09-22 DIAGNOSIS — I739 Peripheral vascular disease, unspecified: Secondary | ICD-10-CM

## 2014-09-22 DIAGNOSIS — R9430 Abnormal result of cardiovascular function study, unspecified: Secondary | ICD-10-CM

## 2014-09-22 DIAGNOSIS — E785 Hyperlipidemia, unspecified: Secondary | ICD-10-CM

## 2014-09-22 DIAGNOSIS — Z9861 Coronary angioplasty status: Secondary | ICD-10-CM

## 2014-09-22 HISTORY — DX: Abnormal result of cardiovascular function study, unspecified: R94.30

## 2014-09-22 NOTE — Progress Notes (Signed)
09/22/2014 Alexa Key   [birth year]  [id number]  Primary Physician [name], PA-C Primary Cardiologist: Dr Percival Spanish  HPI:  64 y/o female seen by Cecilie Kicks for surgical clearance 08/27/14 prior to proposed robiotic partial colectomy with Dr. Leighton Ruff under general anesthesia. She was noted to be bradycardic at 42. In the past her HR was low on current dose of BB and decreased with rise in HR. She is now back on higher dose of BB and HR in the 40s. She feels tired all the time with occ lightheadedness. She denies chest pain, she has chronic DOE. She does not do much activity due to SOB.She had a [employer] 09/10/14 which was abnormal with anterior lateral ischemia. This was new from 2014. I reviewed this with Dr Percival Spanish and he feels she should have a cath prior to have general anesthesia.   Current Outpatient Prescriptions  Medication Sig Dispense Refill  . acetaminophen (TYLENOL) 500 MG tablet Take 1,000 mg by mouth every 6 (six) hours as needed for pain.    Marland Kitchen albuterol (PROVENTIL HFA;VENTOLIN HFA) 108 (90 BASE) MCG/ACT inhaler Inhale 2 puffs into the lungs every 6 (six) hours as needed for wheezing or shortness of breath. 1 Inhaler 6  . amLODipine (NORVASC) 10 MG tablet Take 1 tablet (10 mg total) by mouth daily. 90 tablet 1  . aspirin 325 MG tablet Take 325 mg by mouth daily.    . budesonide-formoterol (SYMBICORT) 160-4.5 MCG/ACT inhaler Inhale 2 puffs into the lungs 2 (two) times daily. 1 Inhaler 12  . cholecalciferol (VITAMIN D) 1000 UNITS tablet Take 2 tablets (2,000 Units total) by mouth at bedtime. 60 tablet 5  . fenofibrate 160 MG tablet Take 1 tablet (160 mg total) by mouth every morning. 90 tablet 3  . HYDROcodone-acetaminophen (NORCO/VICODIN) 5-325 MG per tablet Take 1 tablet by mouth every 6 (six) hours as needed for moderate pain. 30 tablet 0  . losartan (COZAAR) 100 MG tablet Take 1 tablet (100 mg total) by mouth daily. 90 tablet 3  . metoprolol tartrate  (LOPRESSOR) 25 MG tablet Take 12.5 mg by mouth daily.     Marland Kitchen omeprazole (PRILOSEC) 20 MG capsule TAKE 1 CAPSULE (20 MG TOTAL)  BY MOUTH DAILY. 30 capsule 4  . pravastatin (PRAVACHOL) 80 MG tablet Take 1 tablet (80 mg total) by mouth at bedtime. 30 tablet 5   No current facility-administered medications for this visit.    Allergies  Allergen Reactions  . Penicillins Rash  . Sulfa Antibiotics Rash    History   Social History  . Marital Status: Divorced    Spouse Name: N/A  . Number of Children: N/A  . Years of Education: N/A   Occupational History  . Not on file.   Social History Main Topics  . Smoking status: Former Smoker -- 0.25 packs/day for 49 years    Types: Cigarettes    Quit date: 08/12/2014  . Smokeless tobacco: Never Used  . Alcohol Use: No  . Drug Use: No  . Sexual Activity: Not Currently   Other Topics Concern  . Not on file   Social History Narrative     Review of Systems: General: negative for chills, fever, night sweats or weight changes.  Cardiovascular: negative for chest pain, dyspnea on exertion, edema, orthopnea, palpitations, paroxysmal nocturnal dyspnea or shortness of breath Dermatological: negative for rash Respiratory: negative for cough or wheezing Urologic: negative for hematuria Abdominal: negative for nausea, vomiting, diarrhea, bright red blood per  rectum, melena, or hematemesis Neurologic: negative for visual changes, syncope, or dizziness All other systems reviewed and are otherwise negative except as noted above.    Blood pressure 158/74, pulse 72, height 5\' 1"  (1.549 m), weight 143 lb 6.4 oz (65.046 kg).  General appearance: alert, cooperative, no distress and moderately obese Neck: no JVD and soft carotid bruit Lungs: clear to auscultation bilaterally Heart: regular rate and rhythm and soft systolic murmur AOV Abdomen: obese Extremities: no edema Skin: Skin color, texture, turgor normal. No rashes or lesions Neurologic:  Grossly normal  EKG 09/03/14 NSR with lateral ST depression  ASSESSMENT AND PLAN:   Abnormal cardiac function test Abnormal Myoview, "intermediate", no symptoms   CAD S/P remote PCI (no details) No anginal symptoms   PVD- remote Lt [facility] PTA (Dr Kellie Simmering) No symptoms, good pulses   Essential hypertension Controlled   Dyslipidemia On statin Rx   Tobacco user Still smoking    PLAN  Admit for an OP cath. She request a radial approach and she has good radial pulses. She also asked for Valium prior to starting her IV   The patient understands that risks included but are not limited to stroke (1 in 1000), death (1 in 24), kidney failure [usually temporary] (1 in 500), bleeding (1 in 200), allergic reaction [possibly serious] (1 in 200).  The patient understands and agrees to proceed.   [name] PA-C 09/22/2014 1:18 PM

## 2014-09-22 NOTE — Assessment & Plan Note (Signed)
Abnormal Myoview, "intermediate", no symptoms

## 2014-09-22 NOTE — Assessment & Plan Note (Signed)
On statin Rx 

## 2014-09-22 NOTE — Assessment & Plan Note (Signed)
Controlled.  

## 2014-09-22 NOTE — Patient Instructions (Signed)
Your physician recommends that you return for lab work in: 3-7 days prior to the cardiac cath.  Your physician has requested that you have a cardiac catheterization with one of our cardiologists who is an interventionalist. Cardiac catheterization is used to diagnose and/or treat various heart conditions. Doctors may recommend this procedure for a number of different reasons. The most common reason is to evaluate chest pain. Chest pain can be a symptom of coronary artery disease (CAD), and cardiac catheterization can show whether plaque is narrowing or blocking your heart's arteries. This procedure is also used to evaluate the valves, as well as measure the blood flow and oxygen levels in different parts of your heart. For further information please visit [email or web address]. Please follow instruction sheet, as given.

## 2014-09-22 NOTE — Assessment & Plan Note (Signed)
No anginal symptoms

## 2014-09-22 NOTE — Assessment & Plan Note (Signed)
No symptoms, good pulses

## 2014-09-22 NOTE — Assessment & Plan Note (Signed)
Still smoking

## 2014-09-24 ENCOUNTER — Other Ambulatory Visit: Payer: Self-pay | Admitting: *Deleted

## 2014-09-24 DIAGNOSIS — R9439 Abnormal result of other cardiovascular function study: Secondary | ICD-10-CM

## 2014-09-29 ENCOUNTER — Inpatient Hospital Stay (HOSPITAL_COMMUNITY)
Admission: EM | Admit: 2014-09-29 | Discharge: 2014-10-21 | DRG: 329 | Disposition: A | Discharge status: Skilled Nursing Facility | Payer: Commercial Managed Care - HMO | Attending: Internal Medicine | Admitting: Internal Medicine

## 2014-09-29 ENCOUNTER — Emergency Department (HOSPITAL_COMMUNITY): Payer: Commercial Managed Care - HMO

## 2014-09-29 ENCOUNTER — Encounter (HOSPITAL_COMMUNITY): Payer: Self-pay | Admitting: Emergency Medicine

## 2014-09-29 ENCOUNTER — Other Ambulatory Visit: Payer: Self-pay

## 2014-09-29 DIAGNOSIS — N824 Other female intestinal-genital tract fistulae: Secondary | ICD-10-CM | POA: Diagnosis present

## 2014-09-29 DIAGNOSIS — T8131XA Disruption of external operation (surgical) wound, not elsewhere classified, initial encounter: Secondary | ICD-10-CM | POA: Diagnosis not present

## 2014-09-29 DIAGNOSIS — I2510 Atherosclerotic heart disease of native coronary artery without angina pectoris: Secondary | ICD-10-CM | POA: Diagnosis present

## 2014-09-29 DIAGNOSIS — I129 Hypertensive chronic kidney disease with stage 1 through stage 4 chronic kidney disease, or unspecified chronic kidney disease: Secondary | ICD-10-CM | POA: Diagnosis present

## 2014-09-29 DIAGNOSIS — K567 Ileus, unspecified: Secondary | ICD-10-CM | POA: Diagnosis not present

## 2014-09-29 DIAGNOSIS — N3000 Acute cystitis without hematuria: Secondary | ICD-10-CM | POA: Diagnosis present

## 2014-09-29 DIAGNOSIS — F419 Anxiety disorder, unspecified: Secondary | ICD-10-CM | POA: Diagnosis present

## 2014-09-29 DIAGNOSIS — Z79899 Other long term (current) drug therapy: Secondary | ICD-10-CM

## 2014-09-29 DIAGNOSIS — N183 Chronic kidney disease, stage 3 unspecified: Secondary | ICD-10-CM | POA: Diagnosis present

## 2014-09-29 DIAGNOSIS — I5033 Acute on chronic diastolic (congestive) heart failure: Secondary | ICD-10-CM | POA: Insufficient documentation

## 2014-09-29 DIAGNOSIS — R931 Abnormal findings on diagnostic imaging of heart and coronary circulation: Secondary | ICD-10-CM | POA: Diagnosis not present

## 2014-09-29 DIAGNOSIS — Y833 Surgical operation with formation of external stoma as the cause of abnormal reaction of the patient, or of later complication, without mention of misadventure at the time of the procedure: Secondary | ICD-10-CM | POA: Diagnosis not present

## 2014-09-29 DIAGNOSIS — E785 Hyperlipidemia, unspecified: Secondary | ICD-10-CM | POA: Diagnosis present

## 2014-09-29 DIAGNOSIS — R9439 Abnormal result of other cardiovascular function study: Secondary | ICD-10-CM | POA: Diagnosis present

## 2014-09-29 DIAGNOSIS — K219 Gastro-esophageal reflux disease without esophagitis: Secondary | ICD-10-CM | POA: Diagnosis present

## 2014-09-29 DIAGNOSIS — I739 Peripheral vascular disease, unspecified: Secondary | ICD-10-CM | POA: Diagnosis present

## 2014-09-29 DIAGNOSIS — K56609 Unspecified intestinal obstruction, unspecified as to partial versus complete obstruction: Secondary | ICD-10-CM | POA: Diagnosis present

## 2014-09-29 DIAGNOSIS — R109 Unspecified abdominal pain: Secondary | ICD-10-CM | POA: Diagnosis present

## 2014-09-29 DIAGNOSIS — K5669 Other intestinal obstruction: Secondary | ICD-10-CM | POA: Diagnosis present

## 2014-09-29 DIAGNOSIS — Z8673 Personal history of transient ischemic attack (TIA), and cerebral infarction without residual deficits: Secondary | ICD-10-CM

## 2014-09-29 DIAGNOSIS — R8271 Bacteriuria: Secondary | ICD-10-CM

## 2014-09-29 DIAGNOSIS — F17210 Nicotine dependence, cigarettes, uncomplicated: Secondary | ICD-10-CM | POA: Diagnosis present

## 2014-09-29 DIAGNOSIS — I252 Old myocardial infarction: Secondary | ICD-10-CM | POA: Diagnosis not present

## 2014-09-29 DIAGNOSIS — R0902 Hypoxemia: Secondary | ICD-10-CM | POA: Diagnosis not present

## 2014-09-29 DIAGNOSIS — M797 Fibromyalgia: Secondary | ICD-10-CM | POA: Diagnosis present

## 2014-09-29 DIAGNOSIS — N823 Fistula of vagina to large intestine: Secondary | ICD-10-CM | POA: Diagnosis present

## 2014-09-29 DIAGNOSIS — J439 Emphysema, unspecified: Secondary | ICD-10-CM | POA: Diagnosis not present

## 2014-09-29 DIAGNOSIS — I471 Supraventricular tachycardia: Secondary | ICD-10-CM | POA: Diagnosis not present

## 2014-09-29 DIAGNOSIS — K5730 Diverticulosis of large intestine without perforation or abscess without bleeding: Principal | ICD-10-CM | POA: Diagnosis present

## 2014-09-29 DIAGNOSIS — Z7982 Long term (current) use of aspirin: Secondary | ICD-10-CM

## 2014-09-29 DIAGNOSIS — N179 Acute kidney failure, unspecified: Secondary | ICD-10-CM

## 2014-09-29 DIAGNOSIS — B9620 Unspecified Escherichia coli [E. coli] as the cause of diseases classified elsewhere: Secondary | ICD-10-CM | POA: Diagnosis present

## 2014-09-29 DIAGNOSIS — D72829 Elevated white blood cell count, unspecified: Secondary | ICD-10-CM

## 2014-09-29 DIAGNOSIS — R0989 Other specified symptoms and signs involving the circulatory and respiratory systems: Secondary | ICD-10-CM

## 2014-09-29 DIAGNOSIS — N390 Urinary tract infection, site not specified: Secondary | ICD-10-CM | POA: Diagnosis not present

## 2014-09-29 DIAGNOSIS — I10 Essential (primary) hypertension: Secondary | ICD-10-CM | POA: Diagnosis not present

## 2014-09-29 DIAGNOSIS — J449 Chronic obstructive pulmonary disease, unspecified: Secondary | ICD-10-CM | POA: Diagnosis present

## 2014-09-29 DIAGNOSIS — N189 Chronic kidney disease, unspecified: Secondary | ICD-10-CM | POA: Diagnosis not present

## 2014-09-29 DIAGNOSIS — Z95820 Peripheral vascular angioplasty status with implants and grafts: Secondary | ICD-10-CM | POA: Diagnosis not present

## 2014-09-29 DIAGNOSIS — E11649 Type 2 diabetes mellitus with hypoglycemia without coma: Secondary | ICD-10-CM | POA: Diagnosis not present

## 2014-09-29 DIAGNOSIS — J410 Simple chronic bronchitis: Secondary | ICD-10-CM

## 2014-09-29 DIAGNOSIS — Z720 Tobacco use: Secondary | ICD-10-CM | POA: Diagnosis present

## 2014-09-29 DIAGNOSIS — D649 Anemia, unspecified: Secondary | ICD-10-CM | POA: Diagnosis not present

## 2014-09-29 DIAGNOSIS — K659 Peritonitis, unspecified: Secondary | ICD-10-CM

## 2014-09-29 DIAGNOSIS — R1110 Vomiting, unspecified: Secondary | ICD-10-CM

## 2014-09-29 DIAGNOSIS — Z7951 Long term (current) use of inhaled steroids: Secondary | ICD-10-CM | POA: Diagnosis not present

## 2014-09-29 DIAGNOSIS — E860 Dehydration: Secondary | ICD-10-CM | POA: Diagnosis not present

## 2014-09-29 DIAGNOSIS — I259 Chronic ischemic heart disease, unspecified: Secondary | ICD-10-CM | POA: Diagnosis present

## 2014-09-29 DIAGNOSIS — I255 Ischemic cardiomyopathy: Secondary | ICD-10-CM | POA: Diagnosis not present

## 2014-09-29 DIAGNOSIS — I214 Non-ST elevation (NSTEMI) myocardial infarction: Secondary | ICD-10-CM | POA: Diagnosis not present

## 2014-09-29 DIAGNOSIS — R1084 Generalized abdominal pain: Secondary | ICD-10-CM

## 2014-09-29 DIAGNOSIS — Z01810 Encounter for preprocedural cardiovascular examination: Secondary | ICD-10-CM | POA: Diagnosis not present

## 2014-09-29 HISTORY — DX: Personal history of other medical treatment: Z92.89

## 2014-09-29 HISTORY — DX: Abnormal result of cardiovascular function study, unspecified: R94.30

## 2014-09-29 LAB — CBC WITH DIFFERENTIAL/PLATELET
BASOS ABS: 0 10*3/uL (ref 0.0–0.1)
Basophils Relative: 0 % (ref 0–1)
Eosinophils Absolute: 0.2 10*3/uL (ref 0.0–0.7)
Eosinophils Relative: 2 % (ref 0–5)
HCT: 40.1 % (ref 36.0–46.0)
Hemoglobin: 12.5 g/dL (ref 12.0–15.0)
Lymphocytes Relative: 15 % (ref 12–46)
Lymphs Abs: 1.9 10*3/uL (ref 0.7–4.0)
MCH: 30 pg (ref 26.0–34.0)
MCHC: 31.2 g/dL (ref 30.0–36.0)
MCV: 96.4 fL (ref 78.0–100.0)
MONO ABS: 0.6 10*3/uL (ref 0.1–1.0)
MONOS PCT: 4 % (ref 3–12)
NEUTROS ABS: 10.3 10*3/uL — AB (ref 1.7–7.7)
NEUTROS PCT: 79 % — AB (ref 43–77)
Platelets: 283 10*3/uL (ref 150–400)
RBC: 4.16 MIL/uL (ref 3.87–5.11)
RDW: 14.7 % (ref 11.5–15.5)
WBC: 13 10*3/uL — AB (ref 4.0–10.5)

## 2014-09-29 LAB — TROPONIN I: Troponin I: 0.03 ng/mL (ref <0.031)

## 2014-09-29 LAB — COMPREHENSIVE METABOLIC PANEL
ALBUMIN: 3.7 g/dL (ref 3.5–5.0)
ALT: 18 U/L (ref 14–54)
ANION GAP: 10 (ref 5–15)
AST: 22 U/L (ref 15–41)
Alkaline Phosphatase: 77 U/L (ref 38–126)
BUN: 20 mg/dL (ref 6–20)
CALCIUM: 9.1 mg/dL (ref 8.9–10.3)
CO2: 21 mmol/L — AB (ref 22–32)
CREATININE: 1.21 mg/dL — AB (ref 0.44–1.00)
Chloride: 111 mmol/L (ref 101–111)
GFR calc non Af Amer: 46 mL/min — ABNORMAL LOW (ref >60)
GFR, EST AFRICAN AMERICAN: 53 mL/min — AB (ref >60)
Glucose, Bld: 121 mg/dL — ABNORMAL HIGH (ref 65–99)
POTASSIUM: 3.7 mmol/L (ref 3.5–5.1)
Sodium: 142 mmol/L (ref 135–145)
Total Bilirubin: 0.3 mg/dL (ref 0.3–1.2)
Total Protein: 7.2 g/dL (ref 6.5–8.1)

## 2014-09-29 LAB — URINALYSIS, ROUTINE W REFLEX MICROSCOPIC
BILIRUBIN URINE: NEGATIVE
Bilirubin Urine: NEGATIVE
GLUCOSE, UA: NEGATIVE mg/dL
Glucose, UA: NEGATIVE mg/dL
HGB URINE DIPSTICK: NEGATIVE
KETONES UR: NEGATIVE mg/dL
Ketones, ur: NEGATIVE mg/dL
NITRITE: NEGATIVE
Nitrite: NEGATIVE
PH: 5.5 (ref 5.0–8.0)
PH: 5.5 (ref 5.0–8.0)
Protein, ur: 100 mg/dL — AB
Specific Gravity, Urine: <1.005 — ABNORMAL LOW (ref 1.005–1.030)
Urobilinogen, UA: 0.2 mg/dL (ref 0.0–1.0)
Urobilinogen, UA: 0.2 mg/dL (ref 0.0–1.0)

## 2014-09-29 LAB — URINE MICROSCOPIC-ADD ON

## 2014-09-29 LAB — GLUCOSE, CAPILLARY: Glucose-Capillary: 100 mg/dL — ABNORMAL HIGH (ref 65–99)

## 2014-09-29 LAB — CBG MONITORING, ED: GLUCOSE-CAPILLARY: 103 mg/dL — AB (ref 65–99)

## 2014-09-29 LAB — I-STAT CG4 LACTIC ACID, ED
LACTIC ACID, VENOUS: 1.18 mmol/L (ref 0.5–2.0)
LACTIC ACID, VENOUS: 0.5 mmol/L (ref 0.5–2.0)

## 2014-09-29 LAB — LIPASE, BLOOD: LIPASE: 29 U/L (ref 22–51)

## 2014-09-29 MED ORDER — ONDANSETRON HCL 4 MG/2ML IJ SOLN: 4.0000 mg | Freq: Once | INTRAMUSCULAR | Status: DC

## 2014-09-29 MED ORDER — MORPHINE SULFATE 2 MG/ML IJ SOLN
2.0000 mg | INTRAMUSCULAR | Status: DC | PRN
  Administered 2014-09-29 – 2014-09-30 (×3): 2 mg via INTRAVENOUS
  Filled 2014-09-29 (×4): qty 1

## 2014-09-29 MED ORDER — PANTOPRAZOLE SODIUM 40 MG PO TBEC: 40.0000 mg | DELAYED_RELEASE_TABLET | Freq: Every day | ORAL | Status: DC

## 2014-09-29 MED ORDER — MORPHINE SULFATE 2 MG/ML IJ SOLN: 2.0000 mg | INTRAMUSCULAR | Status: DC | PRN

## 2014-09-29 MED ORDER — METOPROLOL TARTRATE 25 MG PO TABS
25.0000 mg | ORAL_TABLET | Freq: Every day | ORAL | Status: DC
  Filled 2014-09-29: qty 1

## 2014-09-29 MED ORDER — ONDANSETRON HCL 4 MG PO TABS
4.0000 mg | ORAL_TABLET | Freq: Four times a day (QID) | ORAL | Status: DC | PRN
  Administered 2014-10-10 – 2014-10-19 (×4): 4 mg via ORAL
  Filled 2014-09-29 (×5): qty 1

## 2014-09-29 MED ORDER — SODIUM CHLORIDE 0.9 % IV SOLN: INTRAVENOUS | Status: DC

## 2014-09-29 MED ORDER — IOHEXOL 300 MG/ML  SOLN
25.0000 mL | Freq: Once | INTRAMUSCULAR | Status: AC | PRN
  Administered 2014-09-29: 25 mL via ORAL

## 2014-09-29 MED ORDER — ONDANSETRON HCL 4 MG/2ML IJ SOLN
4.0000 mg | Freq: Four times a day (QID) | INTRAMUSCULAR | Status: DC | PRN
  Administered 2014-09-30 – 2014-10-16 (×6): 4 mg via INTRAVENOUS
  Filled 2014-09-29 (×8): qty 2

## 2014-09-29 MED ORDER — PANTOPRAZOLE SODIUM 40 MG PO TBEC
40.0000 mg | DELAYED_RELEASE_TABLET | Freq: Every day | ORAL | Status: DC
  Administered 2014-10-01: 40 mg via ORAL
  Filled 2014-09-29 (×2): qty 1

## 2014-09-29 MED ORDER — PRAVASTATIN SODIUM 80 MG PO TABS
80.0000 mg | ORAL_TABLET | Freq: Every day | ORAL | Status: DC
Start: 2014-09-29 — End: 2014-10-01
  Administered 2014-09-29 – 2014-09-30 (×2): 80 mg via ORAL
  Filled 2014-09-29 (×3): qty 1

## 2014-09-29 MED ORDER — BUDESONIDE-FORMOTEROL FUMARATE 160-4.5 MCG/ACT IN AERO
2.0000 | INHALATION_SPRAY | Freq: Two times a day (BID) | RESPIRATORY_TRACT | Status: DC
  Administered 2014-09-29 – 2014-10-21 (×40): 2 via RESPIRATORY_TRACT
  Filled 2014-09-29 (×3): qty 6

## 2014-09-29 MED ORDER — ONDANSETRON HCL 4 MG/2ML IJ SOLN
4.0000 mg | Freq: Three times a day (TID) | INTRAMUSCULAR | Status: DC | PRN
  Administered 2014-09-29: 4 mg via INTRAVENOUS
  Filled 2014-09-29: qty 2

## 2014-09-29 MED ORDER — NICOTINE 7 MG/24HR TD PT24
7.0000 mg | MEDICATED_PATCH | Freq: Every day | TRANSDERMAL | Status: DC
  Administered 2014-09-29 – 2014-10-21 (×22): 7 mg via TRANSDERMAL
  Filled 2014-09-29 (×24): qty 1

## 2014-09-29 MED ORDER — ACETAMINOPHEN 325 MG PO TABS: 650.0000 mg | ORAL_TABLET | Freq: Four times a day (QID) | ORAL | Status: DC | PRN

## 2014-09-29 MED ORDER — ONDANSETRON HCL 4 MG/2ML IJ SOLN
4.0000 mg | Freq: Once | INTRAMUSCULAR | Status: AC
  Administered 2014-09-29: 4 mg via INTRAVENOUS
  Filled 2014-09-29: qty 2

## 2014-09-29 MED ORDER — MORPHINE SULFATE 4 MG/ML IJ SOLN
4.0000 mg | Freq: Once | INTRAMUSCULAR | Status: AC
  Administered 2014-09-29: 4 mg via INTRAVENOUS
  Filled 2014-09-29: qty 1

## 2014-09-29 MED ORDER — AMLODIPINE BESYLATE 10 MG PO TABS
10.0000 mg | ORAL_TABLET | Freq: Every day | ORAL | Status: DC
  Filled 2014-09-29: qty 1

## 2014-09-29 MED ORDER — ASPIRIN 325 MG PO TABS: 325.0000 mg | ORAL_TABLET | Freq: Every day | ORAL | Status: DC

## 2014-09-29 MED ORDER — LOSARTAN POTASSIUM 50 MG PO TABS
100.0000 mg | ORAL_TABLET | Freq: Every day | ORAL | Status: DC
  Filled 2014-09-29: qty 2

## 2014-09-29 MED ORDER — SENNA 8.6 MG PO TABS
2.0000 | ORAL_TABLET | Freq: Two times a day (BID) | ORAL | Status: DC
  Administered 2014-09-29 – 2014-10-01 (×3): 17.2 mg via ORAL
  Filled 2014-09-29 (×5): qty 2

## 2014-09-29 MED ORDER — SODIUM CHLORIDE 0.45 % IV SOLN
INTRAVENOUS | Status: DC
Start: 2014-09-29 — End: 2014-10-01
  Administered 2014-09-29 – 2014-10-01 (×2): via INTRAVENOUS

## 2014-09-29 MED ORDER — IOHEXOL 300 MG/ML  SOLN
100.0000 mL | Freq: Once | INTRAMUSCULAR | Status: AC | PRN
  Administered 2014-09-29: 80 mL via INTRAVENOUS

## 2014-09-29 MED ORDER — POLYETHYLENE GLYCOL 3350 17 G PO PACK
17.0000 g | PACK | Freq: Every day | ORAL | Status: DC | PRN
  Filled 2014-09-29: qty 1

## 2014-09-29 MED ORDER — ASPIRIN EC 325 MG PO TBEC
325.0000 mg | DELAYED_RELEASE_TABLET | Freq: Every day | ORAL | Status: DC
  Filled 2014-09-29: qty 1

## 2014-09-29 MED ORDER — HEPARIN SODIUM (PORCINE) 5000 UNIT/ML IJ SOLN
5000.0000 [IU] | Freq: Three times a day (TID) | INTRAMUSCULAR | Status: AC
  Administered 2014-09-29 – 2014-10-01 (×6): 5000 [IU] via SUBCUTANEOUS
  Filled 2014-09-29 (×7): qty 1

## 2014-09-29 MED ORDER — ACETAMINOPHEN 650 MG RE SUPP
650.0000 mg | Freq: Four times a day (QID) | RECTAL | Status: DC | PRN
Start: 2014-09-29 — End: 2014-10-06

## 2014-09-29 NOTE — ED Notes (Signed)
[facility] at bedside 

## 2014-09-29 NOTE — ED Notes (Signed)
MD Miller at bedside. 

## 2014-09-29 NOTE — ED Notes (Signed)
Family came out requesting pain medicine. MD Sabra Heck made aware.

## 2014-09-29 NOTE — ED Notes (Signed)
Patient brought in via EMS from home. Alert and oriented. Airway patent. Patient c/o mid to low abd pain with nausea and diarrhea. Denies any vomiting or fevers. Patient reports pain has been intermittent x1 week but became worse yesterday and last night. Patient belching-per daughter patient belches when anxiety is increased, which EMS patient anxious with needles and they made 2 IV attempts (one being successful). Patient reports frequency in urination but states that is normal. Per family patient is to have cardiac cath on Friday. Patient had MI in 2014-EMS states slight ST elevation in lead V1 with arrhythmia. Patient denies any chest pain or shortness of breath.

## 2014-09-29 NOTE — ED Provider Notes (Signed)
CSN: [id number]     Arrival date & time 09/29/14  1031 History   First MD Initiated Contact with Patient 09/29/14 1254     Chief Complaint  Patient presents with  . Abdominal Pain     (Consider location/radiation/quality/duration/timing/severity/associated sxs/prior Treatment) HPI Comments: Abd pain, hx of rectovaginal fistula - has pending surgery for same - states she has had 1.5 weeks of diarrhea and abd pain - the pain is worsening, the diarrhea has improved to loose stools - nausea with the pain but no vomiting, no rectal bleedign and no urinary sx.  She denies f/c.  Sx are persistent, gradually worsening - assocaited with distention of upper abd - pain is upper abd R and L.    In the process of being evaluated for preop evaluation she had an abnormal stress test, has heart catheterization pending this Friday for clearance for surgery. She has no chest pain. No exertional symptoms.  Patient is a 64 y.o. female presenting with abdominal pain. The history is provided by the patient and a relative.  Abdominal Pain   Past Medical History  Diagnosis Date  . CAD (coronary artery disease)   . PVD (peripheral vascular disease)   . CVA (cerebral infarction)     TIAx2  . HTN (hypertension)   . HLD (hyperlipidemia)   . Tobacco abuse   . Diverticulitis 08/04/12  . S/P partial lobectomy of lung 1997    per [facility], incidental finding, path c/w benign lesion  . Stroke   . COPD (chronic obstructive pulmonary disease)   . Shortness of breath   . Fibromyalgia   . PONV (postoperative nausea and vomiting)   . Anxiety   . Diabetes mellitus without complication     on no meds   . GERD (gastroesophageal reflux disease)   . Myocardial infarction     07/2012    Past Surgical History  Procedure Laterality Date  . Hysterectomoy    . Lumbar spine surgery    . Carpal tunnel release    . Coronary angioplasty    . Coronary stents     . Partial lobectomy of lung     . Abdominal hysterectomy    .  Back surgery    . Nephrolithotomy Right 12/24/2012    Procedure: NEPHROLITHOTOMY PERCUTANEOUS;  Surgeon: Franchot Gallo, MD;  Location: [facility] ORS;  Service: Urology;  Laterality: Right;  . Flexible sigmoidoscopy  07/11/2014    Procedure: FLEXIBLE SIGMOIDOSCOPY;  Surgeon: Leighton Ruff, MD;  Location: [facility] ENDOSCOPY;  Service: Endoscopy;;   Family History  Problem Relation Age of Onset  . Kidney disease Mother    History  Substance Use Topics  . Smoking status: Former Smoker -- 0.25 packs/day for 49 years    Types: Cigarettes    Quit date: 08/12/2014  . Smokeless tobacco: Never Used  . Alcohol Use: No   OB History    Gravida Para Term Preterm AB TAB SAB Ectopic Multiple Living   2 2 2       2      Review of Systems  Gastrointestinal: Positive for abdominal pain.  All other systems reviewed and are negative.     Allergies  Penicillins and Sulfa antibiotics  Home Medications   Prior to Admission medications   Medication Sig Start Date End Date Taking? Authorizing Provider  acetaminophen (TYLENOL) 500 MG tablet Take 1,000 mg by mouth every 6 (six) hours as needed for pain.   Yes Historical Provider, MD  albuterol (PROVENTIL HFA;VENTOLIN HFA) 108 (  90 BASE) MCG/ACT inhaler Inhale 2 puffs into the lungs every 6 (six) hours as needed for wheezing or shortness of breath. 08/01/14  Yes Tiffany A Gann, PA-C  amLODipine (NORVASC) 10 MG tablet Take 1 tablet (10 mg total) by mouth daily. 10/16/13  Yes Mary-Margaret Hassell Done, FNP  aspirin 325 MG tablet Take 325 mg by mouth daily.   Yes Historical Provider, MD  budesonide-formoterol (SYMBICORT) 160-4.5 MCG/ACT inhaler Inhale 2 puffs into the lungs 2 (two) times daily. 12/13/12  Yes Vernie Shanks, MD  cholecalciferol (VITAMIN D) 1000 UNITS tablet Take 2 tablets (2,000 Units total) by mouth at bedtime. 09/15/14  Yes Tiffany A Gann, PA-C  losartan (COZAAR) 100 MG tablet Take 1 tablet (100 mg total) by mouth daily. 05/19/14  Yes Sharion Balloon, FNP   metoprolol tartrate (LOPRESSOR) 25 MG tablet Take 25 mg by mouth daily.  01/10/13  Yes Vernie Shanks, MD  omeprazole (PRILOSEC) 20 MG capsule TAKE 1 CAPSULE (20 MG TOTAL)  BY MOUTH DAILY. 09/15/14  Yes Tiffany A Gann, PA-C  fenofibrate 160 MG tablet Take 1 tablet (160 mg total) by mouth every morning. Patient not taking: Reported on 09/29/2014 04/04/13   Vernie Shanks, MD  HYDROcodone-acetaminophen (NORCO/VICODIN) 5-325 MG per tablet Take 1 tablet by mouth every 6 (six) hours as needed for moderate pain. Patient not taking: Reported on 09/29/2014 06/06/13   Vernie Shanks, MD  pravastatin (PRAVACHOL) 80 MG tablet Take 1 tablet (80 mg total) by mouth at bedtime. Patient not taking: Reported on 09/29/2014 01/19/13   Vernie Shanks, MD   BP 161/81 mmHg  Pulse 91  Temp(Src) 98.3 F (36.8 C) (Oral)  Resp 23  Ht 5\' 1"  (1.549 m)  Wt 142 lb (64.411 kg)  BMI 26.84 kg/m2  SpO2 99% Physical Exam  Constitutional: She appears well-developed and well-nourished. No distress.  HENT:  Head: Normocephalic and atraumatic.  Mouth/Throat: Oropharynx is clear and moist. No oropharyngeal exudate.  Eyes: Conjunctivae and EOM are normal. Pupils are equal, round, and reactive to light. Right eye exhibits no discharge. Left eye exhibits no discharge. No scleral icterus.  Neck: Normal range of motion. Neck supple. No JVD present. No thyromegaly present.  Cardiovascular: Normal rate, regular rhythm, normal heart sounds and intact distal pulses.  Exam reveals no gallop and no friction rub.   No murmur heard. Pulmonary/Chest: Effort normal and breath sounds normal. No respiratory distress. She has no wheezes. She has no rales.  Abdominal: Soft. Bowel sounds are normal. She exhibits distension. She exhibits no mass. There is tenderness. There is no rebound and no guarding.  Tenderness to palpation across the upper abdomen, tympanitic sounds to percussion across the upper abdomen both right and left, no guarding or  peritoneal signs  Musculoskeletal: Normal range of motion. She exhibits no edema or tenderness.  Lymphadenopathy:    She has no cervical adenopathy.  Neurological: She is alert. Coordination normal.  Skin: Skin is warm and dry. No rash noted. No erythema.  Psychiatric: She has a normal mood and affect. Her behavior is normal.  Nursing note and vitals reviewed.   ED Course  Procedures (including critical care time) Labs Review Labs Reviewed  CBC WITH DIFFERENTIAL/PLATELET - Abnormal; Notable for the following:    WBC 13.0 (*)    Neutrophils Relative % 79 (*)    Neutro Abs 10.3 (*)    All other components within normal limits  COMPREHENSIVE METABOLIC PANEL - Abnormal; Notable for the following:  CO2 21 (*)    Glucose, Bld 121 (*)    Creatinine, Ser 1.21 (*)    GFR calc non Af Amer 46 (*)    GFR calc Af Amer 53 (*)    All other components within normal limits  URINALYSIS, ROUTINE W REFLEX MICROSCOPIC (NOT AT [facility]) - Abnormal; Notable for the following:    APPearance HAZY (*)    Specific Gravity, Urine >1.030 (*)    Protein, ur 100 (*)    Leukocytes, UA SMALL (*)    All other components within normal limits  URINE MICROSCOPIC-ADD ON - Abnormal; Notable for the following:    Squamous Epithelial / LPF MANY (*)    Bacteria, UA MANY (*)    All other components within normal limits  URINALYSIS, ROUTINE W REFLEX MICROSCOPIC (NOT AT [facility]) - Abnormal; Notable for the following:    APPearance HAZY (*)    Specific Gravity, Urine <1.005 (*)    Hgb urine dipstick SMALL (*)    Protein, ur TRACE (*)    Leukocytes, UA MODERATE (*)    All other components within normal limits  URINE MICROSCOPIC-ADD ON - Abnormal; Notable for the following:    Squamous Epithelial / LPF MANY (*)    Bacteria, UA FEW (*)    All other components within normal limits  CBG MONITORING, ED - Abnormal; Notable for the following:    Glucose-Capillary 103 (*)    All other components within normal limits  LIPASE,  BLOOD  TROPONIN I  I-STAT CG4 LACTIC ACID, ED  I-STAT CG4 LACTIC ACID, ED   Imaging Review Ct Abdomen Pelvis W Contrast  09/29/2014   CLINICAL DATA:  Lower abdominal pain  EXAM: CT ABDOMEN AND PELVIS WITH CONTRAST  TECHNIQUE: Multidetector CT imaging of the abdomen and pelvis was performed using the standard protocol following bolus administration of intravenous contrast.  CONTRAST:  62mL OMNIPAQUE IOHEXOL 300 MG/ML SOLN, 24mL OMNIPAQUE IOHEXOL 300 MG/ML SOLN  COMPARISON:  None.  FINDINGS: Lung bases are free of acute infiltrate or sizable effusion.  The liver, gallbladder, spleen, adrenal glands and pancreas are within normal limits. The kidneys are well visualized and demonstrate right renal cystic change stable from the prior exam. Stable renal calculi are noted on the left. 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 It is stable in appearance from the prior exam. Stable scattered atherosclerotic changes are noted throughout the aortoiliac system.  The colon is somewhat distended with air with area of wall thickening and narrowing in the sigmoid colon. This has progressed in the interval from the prior exam. Given findings from recent sigmoidoscopy this likely represents progressive smooth muscle thickening. Clinical correlation is recommended. The bladder is decompressed. No pelvic mass lesion is noted. The bony structures demonstrate postoperative change in the lumbar spine.  IMPRESSION: Stable cystic and calculus changes in the kidneys bilaterally.  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  Increased colonic distention with air secondary to an area of narrowing in the sigmoid colon. This likely represents progressive smooth muscle thickening and narrowing. The need for direct visualization can be determined on a clinical basis.   Electronically Signed   By: Inez Catalina M.D.   On: 09/29/2014 14:07    ED  ECG REPORT  I personally interpreted this EKG   Date: 09/29/2014   Rate: 79  Rhythm: normal sinus rhythm  QRS Axis: indeterminate  Intervals: normal  ST/T  Wave abnormalities: nonspecific T wave changes  Conduction Disutrbances:none  Narrative Interpretation:   Old EKG Reviewed: unchanged from 11/2012   MDM   Final diagnoses:  Abdominal pain     the patient has an elevated white blood cell count, creatinine is 1.2, urinalysis with many bacteria, this was a clean catch, will repeat with in and out catheterization due to the patient's history of rectovaginal fistula , blood pressure elevated but no fever or tachycardia, need CT scan to further evaluate the source of her upper abdominal pain as I suspect this is colitis or related to the underlying colonic abnormalities. She does report a recent colonoscopy was incomplete as they were unable to pass certain parts of the colon.  CT shows sigmoid stricture - likely cuasing pain by colonic distention.  D/w Dr. Marlou Starks - will consult as needed  D/w Dr. Marily Memos who will help transfer to [facility], MD 09/29/14 [phone]

## 2014-09-29 NOTE — H&P (Signed)
[facility] History and Physical  Alexa Key VFI:[id number] DOB: 08/19/1950 DOA: 09/29/2014  Referring physician: Dr Sabra Heck - [facility] PCP: [name], PA-C   Chief Complaint: Abd pain  HPI: Alexa Key is a 64 y.o. female  Abdominal pain: Patient with chronic ongoing abdominal pain secondary to rectovaginal fistula and sigmoid stricture. Patient's current episode started 7 days ago. Comes and goes with dozens of small episodes daily of sharp generalized abdominal pain. Improved slightly with certain body positions. Associated with nausea. Patient also initially had diarrhea which is since resolved. No further vaginal fecal discharge over the last 7 days. Denies dysuria, frequency, back pain, fevers, vomiting, chest pain, shortness of breath, palpitations. Patient has not taken any medications for this problem specifically. Patient states that her appetite is minimally reduced but has been eating well. Of note patient was scheduled for cardiac catheterization on 10/03/2014 for medical clearance for surgical intervention of fistula and stricture. Patient's surgeon is Dr. Leighton Ruff..    Review of Systems:  Constitutional:  No weight loss, night sweats, Fevers, chills, fatigue.  HEENT:  No headaches, Difficulty swallowing,Tooth/dental problems,Sore throat,  No sneezing, itching, ear ache, nasal congestion, post nasal drip,  Cardio-vascular:  No chest pain, Orthopnea, PND, swelling in lower extremities, anasarca, dizziness, palpitations  GI: Per HPI Resp:   No shortness of breath with exertion or at rest. No excess mucus, no productive cough, No non-productive cough, No coughing up of blood.No change in color of mucus.No wheezing.No chest wall deformity  Skin:  no rash or lesions.  GU:  no dysuria, change in color of urine, no urgency or frequency. No flank pain.  Musculoskeletal:   No joint pain or swelling. No decreased range of motion. No back pain.  Psych:  No change  in mood or affect. No depression or anxiety. No memory loss.   Past Medical History  Diagnosis Date  . CAD (coronary artery disease)   . PVD (peripheral vascular disease)   . CVA (cerebral infarction)     TIAx2  . HTN (hypertension)   . HLD (hyperlipidemia)   . Tobacco abuse   . Diverticulitis 08/04/12  . S/P partial lobectomy of lung 1997    per [facility], incidental finding, path c/w benign lesion  . Stroke   . COPD (chronic obstructive pulmonary disease)   . Shortness of breath   . Fibromyalgia   . PONV (postoperative nausea and vomiting)   . Anxiety   . Diabetes mellitus without complication     on no meds   . GERD (gastroesophageal reflux disease)   . Myocardial infarction     07/2012    Past Surgical History  Procedure Laterality Date  . Hysterectomoy    . Lumbar spine surgery    . Carpal tunnel release    . Coronary angioplasty    . Coronary stents     . Partial lobectomy of lung     . Abdominal hysterectomy    . Back surgery    . Nephrolithotomy Right 12/24/2012    Procedure: NEPHROLITHOTOMY PERCUTANEOUS;  Surgeon: Franchot Gallo, MD;  Location: [facility];  Service: Urology;  Laterality: Right;  . Flexible sigmoidoscopy  07/11/2014    Procedure: FLEXIBLE SIGMOIDOSCOPY;  Surgeon: Leighton Ruff, MD;  Location: [facility];  Service: Endoscopy;;   Social History:  reports that she has been smoking Cigarettes.  She has a 12.25 pack-year smoking history. She has never used smokeless tobacco. She reports that she does not drink alcohol or use  illicit drugs.  Allergies  Allergen Reactions  . Penicillins Rash  . Sulfa Antibiotics Rash    Family History  Problem Relation Age of Onset  . Kidney disease Mother      Prior to Admission medications   Medication Sig Start Date End Date Taking? Authorizing Provider  acetaminophen (TYLENOL) 500 MG tablet Take 1,000 mg by mouth every 6 (six) hours as needed for pain.   Yes Historical Provider, MD  albuterol (PROVENTIL  HFA;VENTOLIN HFA) 108 (90 BASE) MCG/ACT inhaler Inhale 2 puffs into the lungs every 6 (six) hours as needed for wheezing or shortness of breath. 08/01/14  Yes Tiffany A Gann, PA-C  amLODipine (NORVASC) 10 MG tablet Take 1 tablet (10 mg total) by mouth daily. 10/16/13  Yes Mary-Margaret Hassell Done, FNP  aspirin 325 MG tablet Take 325 mg by mouth daily.   Yes Historical Provider, MD  budesonide-formoterol (SYMBICORT) 160-4.5 MCG/ACT inhaler Inhale 2 puffs into the lungs 2 (two) times daily. 12/13/12  Yes Vernie Shanks, MD  cholecalciferol (VITAMIN D) 1000 UNITS tablet Take 2 tablets (2,000 Units total) by mouth at bedtime. 09/15/14  Yes Tiffany A Gann, PA-C  losartan (COZAAR) 100 MG tablet Take 1 tablet (100 mg total) by mouth daily. 05/19/14  Yes Sharion Balloon, FNP  metoprolol tartrate (LOPRESSOR) 25 MG tablet Take 25 mg by mouth daily.  01/10/13  Yes Vernie Shanks, MD  omeprazole (PRILOSEC) 20 MG capsule TAKE 1 CAPSULE (20 MG TOTAL)  BY MOUTH DAILY. 09/15/14  Yes Tiffany A Gann, PA-C  fenofibrate 160 MG tablet Take 1 tablet (160 mg total) by mouth every morning. Patient not taking: Reported on 09/29/2014 04/04/13   Vernie Shanks, MD  HYDROcodone-acetaminophen (NORCO/VICODIN) 5-325 MG per tablet Take 1 tablet by mouth every 6 (six) hours as needed for moderate pain. Patient not taking: Reported on 09/29/2014 06/06/13   Vernie Shanks, MD  pravastatin (PRAVACHOL) 80 MG tablet Take 1 tablet (80 mg total) by mouth at bedtime. Patient not taking: Reported on 09/29/2014 01/19/13   Vernie Shanks, MD   Physical Exam: Filed Vitals:   09/29/14 1430 09/29/14 1500 09/29/14 1630 09/29/14 1700  BP: 145/72 161/81 144/68 145/74  Pulse: 85 91 83 78  Temp:      TempSrc:      Resp: 18 23 19 21   Height:      Weight:      SpO2: 100% 99% 98% 98%    Wt Readings from Last 3 Encounters:  09/29/14 64.411 kg (142 lb)  09/22/14 65.046 kg (143 lb 6.4 oz)  09/10/14 64.411 kg (142 lb)    General:  Appears calm and  comfortable Eyes:  PERRL, normal lids, irises & conjunctiva ENT:  grossly normal hearing, lips & tongue Neck:  no LAD, masses or thyromegaly Cardiovascular:  RRR, no m/r/g. No LE edema. Telemetry:  SR, no arrhythmias  Respiratory:  CTA bilaterally, no w/r/r. Normal respiratory effort. Abdomen:  Soft, minimal distention, minimal intermittent generalized discomfort on deep palpation. No masses felt. Nontender at McBurney's point and no Murphy sign. Normal active bowel sounds Skin:  no rash or induration seen on limited exam Musculoskeletal:  grossly normal tone BUE/BLE Psychiatric:  grossly normal mood and affect, speech fluent and appropriate Neurologic:  grossly non-focal.          Labs on Admission:  Basic Metabolic Panel:  Recent Labs Lab 09/29/14 1107  NA 142  K 3.7  CL 111  CO2 21*  GLUCOSE 121*  BUN 20  CREATININE 1.21*  CALCIUM 9.1   Liver Function Tests:  Recent Labs Lab 09/29/14 1107  AST 22  ALT 18  ALKPHOS 77  BILITOT 0.3  PROT 7.2  ALBUMIN 3.7    Recent Labs Lab 09/29/14 1108  LIPASE 29   No results for input(s): AMMONIA in the last 168 hours. CBC:  Recent Labs Lab 09/29/14 1107  WBC 13.0*  NEUTROABS 10.3*  HGB 12.5  HCT 40.1  MCV 96.4  PLT 283   Cardiac Enzymes:  Recent Labs Lab 09/29/14 1108  TROPONINI 0.03    BNP (last 3 results) No results for input(s): BNP in the last 8760 hours.  ProBNP (last 3 results) No results for input(s): PROBNP in the last 8760 hours.  CBG:  Recent Labs Lab 09/29/14 1102  GLUCAP 103*    Radiological Exams on Admission: Ct Abdomen Pelvis W Contrast  09/29/2014   CLINICAL DATA:  Lower abdominal pain  EXAM: CT ABDOMEN AND PELVIS WITH CONTRAST  TECHNIQUE: Multidetector CT imaging of the abdomen and pelvis was performed using the standard protocol following bolus administration of intravenous contrast.  CONTRAST:  32mL OMNIPAQUE IOHEXOL 300 MG/ML SOLN, 54mL OMNIPAQUE IOHEXOL 300 MG/ML SOLN   COMPARISON:  None.  FINDINGS: Lung bases are free of acute infiltrate or sizable effusion.  The liver, gallbladder, spleen, adrenal glands and pancreas are within normal limits. The kidneys are well visualized and demonstrate right renal cystic change stable from the prior exam. Stable renal calculi are noted on the left. 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 It is stable in appearance from the prior exam. Stable scattered atherosclerotic changes are noted throughout the aortoiliac system.  The colon is somewhat distended with air with area of wall thickening and narrowing in the sigmoid colon. This has progressed in the interval from the prior exam. Given findings from recent sigmoidoscopy this likely represents progressive smooth muscle thickening. Clinical correlation is recommended. The bladder is decompressed. No pelvic mass lesion is noted. The bony structures demonstrate postoperative change in the lumbar spine.  IMPRESSION: Stable cystic and calculus changes in the kidneys bilaterally.  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  Increased colonic distention with air secondary to an area of narrowing in the sigmoid colon. This likely represents progressive smooth muscle thickening and narrowing. The need for direct visualization can be determined on a clinical basis.   Electronically Signed   By: Inez Catalina M.D.   On: 09/29/2014 14:07     Assessment/Plan Principal Problem:   Rectovaginal fistula Active Problems:   Tobacco user   Essential hypertension   COPD (chronic obstructive pulmonary disease)   Abdominal pain   CAD in native artery   CKD (chronic kidney disease) stage 3, GFR 30-59 ml/min   GERD (gastroesophageal reflux disease)   Bacteriuria  Abdominal Pain: Likely secondary to ongoing rectovaginal fistula and sigmoid stricture. It had some additional viral gastroenteritis in the week  which is now improving. Patient's pain is getting worse. Abdominal CT showing increased colonic distention and air with thickening of the sigmoid smooth muscle. No overt sign of infection or bowel obstruction. Dr. Sabra Heck discussed case w/ Dr Marlou Starks of general surgery [facility] requested patient be admitted there to the medicine service with consultation to Gen. Surgery. Patient's Surgeon is Dr. Leighton Ruff. Lactic acid 1.18.  - [facility]urg at [facility] - f/u Gen Surge recommendations  Coronary artery disease:  Patient previously scheduled for cardiac cath for medical clearance on 10/03/2014. Patient's cardiologist Dr. Percival Spanish. Troponin neg.  - Consult cardiology in the am to discuss cath plans.  - Pt NPO after midnight.  - Continue statin and aspirin  CKD III: Cr 1.21. At baseline or slightly better. - BMET in am  COPD: Patient does smoke. No sign of acute exacerbation. - Continue Symbicort - Nicotine patch  Bacteriuria: Technically chronically colonized. Second UA sample from cath. No need for treatment with him about this point on this patient asymptomatic. -  Urine culture  HTN: - Continue losartan, Norvasc, metoprolol.  GERD: - Continue PPI  HLD:  - continue statin   Code Status: FULL DVT Prophylaxis: Hep Family Communication: Daughter Disposition Plan: pending improvement and evaluation by Gen Surge and Cardiology  Izeyah Deike Lenna Sciara, MD Family Medicine [facility] [email or web address] Password [email or web address]

## 2014-09-29 NOTE — ED Notes (Signed)
MD Merrell at bedside. 

## 2014-09-29 NOTE — ED Notes (Signed)
Pt c/o of increased abdominal pain. Requesting more pain medication. MD Sabra Heck made aware.

## 2014-09-30 ENCOUNTER — Encounter (HOSPITAL_COMMUNITY): Payer: Self-pay

## 2014-09-30 DIAGNOSIS — N823 Fistula of vagina to large intestine: Secondary | ICD-10-CM

## 2014-09-30 DIAGNOSIS — R931 Abnormal findings on diagnostic imaging of heart and coronary circulation: Secondary | ICD-10-CM

## 2014-09-30 DIAGNOSIS — I255 Ischemic cardiomyopathy: Secondary | ICD-10-CM

## 2014-09-30 DIAGNOSIS — R9439 Abnormal result of other cardiovascular function study: Secondary | ICD-10-CM | POA: Diagnosis present

## 2014-09-30 DIAGNOSIS — I10 Essential (primary) hypertension: Secondary | ICD-10-CM

## 2014-09-30 DIAGNOSIS — Z01810 Encounter for preprocedural cardiovascular examination: Secondary | ICD-10-CM

## 2014-09-30 DIAGNOSIS — I259 Chronic ischemic heart disease, unspecified: Secondary | ICD-10-CM | POA: Diagnosis present

## 2014-09-30 DIAGNOSIS — Z720 Tobacco use: Secondary | ICD-10-CM

## 2014-09-30 LAB — GLUCOSE, CAPILLARY: GLUCOSE-CAPILLARY: 127 mg/dL — AB (ref 65–99)

## 2014-09-30 MED ORDER — SODIUM CHLORIDE 0.9 % IV SOLN
250.0000 mL | INTRAVENOUS | Status: DC | PRN
  Administered 2014-10-02 (×3): via INTRAVENOUS

## 2014-09-30 MED ORDER — ASPIRIN 81 MG PO CHEW
81.0000 mg | CHEWABLE_TABLET | ORAL | Status: AC
  Administered 2014-10-01: 81 mg via ORAL
  Filled 2014-09-30: qty 1

## 2014-09-30 MED ORDER — HYDROMORPHONE HCL 1 MG/ML IJ SOLN
1.0000 mg | INTRAMUSCULAR | Status: AC | PRN
  Administered 2014-09-30 – 2014-10-01 (×2): 1 mg via INTRAVENOUS
  Filled 2014-09-30 (×2): qty 1

## 2014-09-30 MED ORDER — PROMETHAZINE HCL 25 MG/ML IJ SOLN
12.5000 mg | Freq: Four times a day (QID) | INTRAMUSCULAR | Status: DC | PRN
  Administered 2014-09-30 – 2014-10-01 (×2): 12.5 mg via INTRAVENOUS
  Filled 2014-09-30 (×2): qty 1

## 2014-09-30 MED ORDER — ZOLPIDEM TARTRATE 5 MG PO TABS
5.0000 mg | ORAL_TABLET | Freq: Every evening | ORAL | Status: DC | PRN
  Administered 2014-09-30 – 2014-10-20 (×11): 5 mg via ORAL
  Filled 2014-09-30 (×11): qty 1

## 2014-09-30 MED ORDER — LORAZEPAM 0.5 MG PO TABS
0.5000 mg | ORAL_TABLET | Freq: Once | ORAL | Status: AC
  Administered 2014-09-30: 0.5 mg via ORAL
  Filled 2014-09-30: qty 1

## 2014-09-30 MED ORDER — SODIUM CHLORIDE 0.9 % IJ SOLN: 3.0000 mL | INTRAMUSCULAR | Status: DC | PRN

## 2014-09-30 MED ORDER — PNEUMOCOCCAL VAC POLYVALENT 25 MCG/0.5ML IJ INJ
0.5000 mL | INJECTION | INTRAMUSCULAR | Status: DC
  Filled 2014-09-30: qty 0.5

## 2014-09-30 MED ORDER — SODIUM CHLORIDE 0.9 % WEIGHT BASED INFUSION
3.0000 mL/kg/h | INTRAVENOUS | Status: AC
  Administered 2014-10-01: 3 mL/kg/h via INTRAVENOUS

## 2014-09-30 MED ORDER — SODIUM CHLORIDE 0.9 % IJ SOLN
3.0000 mL | Freq: Two times a day (BID) | INTRAMUSCULAR | Status: DC
  Administered 2014-09-30: 3 mL via INTRAVENOUS

## 2014-09-30 MED ORDER — METOPROLOL TARTRATE 1 MG/ML IV SOLN
2.5000 mg | Freq: Four times a day (QID) | INTRAVENOUS | Status: DC
  Administered 2014-09-30 – 2014-10-02 (×8): 2.5 mg via INTRAVENOUS
  Filled 2014-09-30 (×13): qty 5

## 2014-09-30 MED ORDER — SODIUM CHLORIDE 0.9 % WEIGHT BASED INFUSION
1.0000 mL/kg/h | INTRAVENOUS | Status: DC
  Administered 2014-10-01: 1 mL/kg/h via INTRAVENOUS

## 2014-09-30 NOTE — Progress Notes (Signed)
Prior to notifying hospitalist, patient has been dry heaving since earlier in shift. Notified on-call hospitalist of patient having increased anxiety due to having procedure tomorrow as well as requesting something for sleep. Was notified in shift report that patient has vomiting after receiving IV morphine and therefore also notified hospitalist of that too. Ambien, Ativan, and IV Dilaudid all administered as ordered. Patient is now calm and is not having any dry heaving at this moment. Patient is resting soundly. Will continue to monitor patient to end of shift.

## 2014-09-30 NOTE — Progress Notes (Addendum)
PROGRESS NOTE  Alexa Key DDU:[id number] DOB: 25-Jan-1951 DOA: 09/29/2014 PCP: [name], PA-C  Brief history 64 year old female with a history of COPD, CKD stage III, continued tobacco use, hypertension presented with one-week history of worsening abdominal pain and nausea. The patient had a CT of the abdomen and pelvis at the time of admission which revealed distended sigmoid colon with air and wall thickening that has progressed since her previous CT on 07/04/2014. The patient has an established colovaginal fistula for which the patient was previously waiting for cardiac clearance. She was scheduled to have cardiac catheterization on 10/03/2014. Assessment/Plan: Abdominal pain/colovaginal fistula -Likely related to the patient's colovaginal fistula and sigmoid stricture -Gen. surgery has been consulted -Lactic acid 1.18--> 0.50 -09/29/2014 CT abdomen and pelvis showed increased colonic distention and air with thickening of the sigmoid smooth muscle -As the patient is afebrile and hemodynamically stable at this time, will not start antibiotics -blood cultures x 2 sets  Coronary artery disease with history of MI -Reconsult cardiology for cardiac clearance -The patient was due for cardiac catheterization 10/03/2014 -09/10/2014 Myoview was intermediate risk -Continue aspirin   Pyuria/bacteriuria -Add urine culture--patient may have contamination from her colovaginal fistula CKD stage III -Baseline creatinine 1.2-1.5 COPD -Stable without signs of exacerbation -Continue Symbicort Tobacco abuse -NicoDerm patch -Tobacco cessation discussed Hypertension -Hold  losartan, amlodipine, metoprolol tartrate as unable to tolerate po presently -lopressor IV   Family Communication:   Pt at beside Disposition Plan:   Home when medically stable       Procedures/Studies: Ct Abdomen Pelvis W Contrast  09/29/2014   CLINICAL DATA:  Lower abdominal pain  EXAM: CT ABDOMEN AND  PELVIS WITH CONTRAST  TECHNIQUE: Multidetector CT imaging of the abdomen and pelvis was performed using the standard protocol following bolus administration of intravenous contrast.  CONTRAST:  73mL OMNIPAQUE IOHEXOL 300 MG/ML SOLN, 22mL OMNIPAQUE IOHEXOL 300 MG/ML SOLN  COMPARISON:  None.  FINDINGS: Lung bases are free of acute infiltrate or sizable effusion.  The liver, gallbladder, spleen, adrenal glands and pancreas are within normal limits. The kidneys are well visualized and demonstrate right renal cystic change stable from the prior exam. Stable renal calculi are noted on the left. 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 It is stable in appearance from the prior exam. Stable scattered atherosclerotic changes are noted throughout the aortoiliac system.  The colon is somewhat distended with air with area of wall thickening and narrowing in the sigmoid colon. This has progressed in the interval from the prior exam. Given findings from recent sigmoidoscopy this likely represents progressive smooth muscle thickening. Clinical correlation is recommended. The bladder is decompressed. No pelvic mass lesion is noted. The bony structures demonstrate postoperative change in the lumbar spine.  IMPRESSION: Stable cystic and calculus changes in the kidneys bilaterally.  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  Increased colonic distention with air secondary to an area of narrowing in the sigmoid colon. This likely represents progressive smooth muscle thickening and narrowing. The need for direct visualization can be determined on a clinical basis.   Electronically Signed   By: Inez Catalina M.D.   On: 09/29/2014 14:07         Subjective: Patient plans nausea and abdominal pain. She has had a couple episodes of emesis since admission. No hematemesis, hematochezia, melena. Denies any fevers, chills, chest discomfort,  shortness breath, headaches.  Objective: Filed Vitals:   09/29/14 2151 09/29/14 2312 09/30/14 0534 09/30/14 0808  BP:  130/55 138/63   Pulse:  69 75   Temp:   98.3 F (36.8 C)   TempSrc:   Oral   Resp:  18 16   Height:      Weight:   66.497 kg (146 lb 9.6 oz)   SpO2: 97% 99% 95% 98%    Intake/Output Summary (Last 24 hours) at 09/30/14 0948 Last data filed at 09/30/14 0808  Gross per 24 hour  Intake    745 ml  Output      0 ml  Net    745 ml   Weight change:  Exam:   General:  Pt is alert, follows commands appropriately, not in acute distress  HEENT: No icterus, No thrush, No meningismus, [place]/AT  Cardiovascular: RRR, S1/S2, no rubs, no gallops  Respiratory: CTA bilaterally, no wheezing, no crackles, no rhonchi  Abdomen: Soft/+BS, epigastric LLQ tender, non distended, no guarding, no rebound tenderness  Extremities: No edema, No lymphangitis, No petechiae, No rashes, no synovitis  Data Reviewed: Basic Metabolic Panel:  Recent Labs Lab 09/29/14 1107  NA 142  K 3.7  CL 111  CO2 21*  GLUCOSE 121*  BUN 20  CREATININE 1.21*  CALCIUM 9.1   Liver Function Tests:  Recent Labs Lab 09/29/14 1107  AST 22  ALT 18  ALKPHOS 77  BILITOT 0.3  PROT 7.2  ALBUMIN 3.7    Recent Labs Lab 09/29/14 1108  LIPASE 29   No results for input(s): AMMONIA in the last 168 hours. CBC:  Recent Labs Lab 09/29/14 1107  WBC 13.0*  NEUTROABS 10.3*  HGB 12.5  HCT 40.1  MCV 96.4  PLT 283   Cardiac Enzymes:  Recent Labs Lab 09/29/14 1108  TROPONINI 0.03   BNP: Invalid input(s): POCBNP CBG:  Recent Labs Lab 09/29/14 1102 09/29/14 2258 09/30/14 0600  GLUCAP 103* 100* 127*    No results found for this or any previous visit (from the past 240 hour(s)).   Scheduled Meds: . amLODipine  10 mg Oral Daily  . aspirin EC  325 mg Oral Daily  . budesonide-formoterol  2 puff Inhalation BID  . heparin  5,000 Units Subcutaneous 3 times per day  . losartan  100 mg  Oral Daily  . metoprolol tartrate  25 mg Oral Daily  . nicotine  7 mg Transdermal Daily  . pantoprazole  40 mg Oral Daily  . [START ON 10/01/2014] pneumococcal 23 valent vaccine  0.5 mL Intramuscular Tomorrow-1000  . pravastatin  80 mg Oral QHS  . senna  2 tablet Oral BID   Continuous Infusions: . sodium chloride 75 mL/hr at 09/29/14 2140     Jakylah Bassinger, DO  [facility] Pager [phone]  If 7PM-7AM, please contact night-coverage [email or web address] Password [email or web address] 09/30/2014, 9:48 AM   LOS: 1 day

## 2014-09-30 NOTE — H&P (Addendum)
CARDIOLOGY CONSULT NOTE   Patient ID: Alexa Key MRN: [id number], DOB/AGE: May 19, 1950   Admit date: 09/29/2014 Date of Consult: 09/30/2014   Primary Physician: [name], PA-C Primary Cardiologist: Lenna Sciara. Hochrein, MD   Pt. Profile  64 year old female with PMH significant for tobacco abuse, COPD HTN, CKD stage III, and rectovaginal fistula presenting with acute abdominal pain and nausea.   Problem List  Past Medical History  Diagnosis Date  . CAD (coronary artery disease)     a. 07/2012 s/p MI ->not cath candidate 2/2 AKI; b. 09/2012 Neg MV;  c. 08/2014 Lexi CL: EF 45-54%, + ischemia, intermittent risk study.  Marland Kitchen PVD (peripheral vascular disease)   . CVA (cerebral infarction)     TIAx2  . HTN (hypertension)   . HLD (hyperlipidemia)   . Tobacco abuse   . Diverticulitis 08/04/12  . S/P partial lobectomy of lung 1997    per [facility], incidental finding, path c/w benign lesion  . Stroke   . COPD (chronic obstructive pulmonary disease)   . Fibromyalgia   . PONV (postoperative nausea and vomiting)   . Anxiety   . Diabetes mellitus without complication     on no meds   . GERD (gastroesophageal reflux disease)   . H/O echocardiogram     a. 07/2012 Echo: EF 50-55%, grade 2 DD, mild LVH.    Past Surgical History  Procedure Laterality Date  . Hysterectomoy    . Lumbar spine surgery    . Carpal tunnel release    . Coronary angioplasty    . Coronary stents     . Partial lobectomy of lung     . Abdominal hysterectomy    . Back surgery    . Nephrolithotomy Right 12/24/2012    Procedure: NEPHROLITHOTOMY PERCUTANEOUS;  Surgeon: Franchot Gallo, MD;  Location: [facility];  Service: Urology;  Laterality: Right;  . Flexible sigmoidoscopy  07/11/2014    Procedure: FLEXIBLE SIGMOIDOSCOPY;  Surgeon: Leighton Ruff, MD;  Location: [facility];  Service: Endoscopy;;     Allergies  Allergies  Allergen Reactions  . Penicillins Rash  . Sulfa Antibiotics Rash    HPI  Patient has chronic  abdominal pain associated with rectovaginal fistula. She describes this pain as an intermittent, sharp, stabbing pain which abates with changing position. Beginning one week ago, this pain became more frequent and more severe, occurring at least every hour. Over the last 2-3 days she's had gradually increasing nausea with intermittent vomiting. She states that she vomited 3 times yesterday and this does not appear to correlate with meals. She also has frequent gas, bloating, and belching. She has no cardiac symptoms and has never had chest pain. She has intermittent dyspnea on exertion, but this only occurs with extensive exertion like walking to the store and back.   Inpatient Medications  . budesonide-formoterol  2 puff Inhalation BID  . heparin  5,000 Units Subcutaneous 3 times per day  . metoprolol  2.5 mg Intravenous 4 times per day  . nicotine  7 mg Transdermal Daily  . pantoprazole  40 mg Oral Daily  . [START ON 10/01/2014] pneumococcal 23 valent vaccine  0.5 mL Intramuscular Tomorrow-1000  . pravastatin  80 mg Oral QHS  . senna  2 tablet Oral BID    Family History Family History  Problem Relation Age of Onset  . Kidney disease Mother      Social History History   Social History  . Marital Status: Divorced  Spouse Name: N/A  . Number of Children: N/A  . Years of Education: N/A   Occupational History  . Not on file.   Social History Main Topics  . Smoking status: Light Tobacco Smoker -- 0.25 packs/day for 49 years    Types: Cigarettes  . Smokeless tobacco: Never Used  . Alcohol Use: No  . Drug Use: No  . Sexual Activity: Not Currently   Other Topics Concern  . Not on file   Social History Narrative     Review of Systems  General:  No chills, fever, night sweats or weight changes.  Cardiovascular:  No chest pain, edema, orthopnea, palpitations, paroxysmal nocturnal dyspnea. Intermittent dyspnea on exertion.  Dermatological: No rash, lesions/masses Respiratory:  Intermittent dry cough. No dyspnea.  Urologic: No hematuria, dysuria Abdominal:   Positive for nausea, vomiting, and diarrhea, No bright red blood per rectum, melena, or hematemesis Neurologic:  No visual changes, wkns, changes in mental status. All other systems reviewed and are otherwise negative except as noted above.  Physical Exam  Blood pressure 138/63, pulse 75, temperature 98.3 F (36.8 C), temperature source Oral, resp. rate 16, height 5\' 1"  (1.549 m), weight 66.497 kg (146 lb 9.6 oz), SpO2 98 %.  General: Obese, pleasant, NAD - with the exception of intermittent sharp lower abdominal pains and belching Psych: Normal affect. Neuro: Alert and oriented X 3. Moves all extremities spontaneously. CN II-12 grossly intact HEENT: [place]/80, EOMI  Neck: Supple without bruits. Difficult to assess JVP 2/2 girth.  Lungs:  Breath sounds absent in RLL. Scattered wheezes throughout. Diminished breath sounds in LLL and RML.  Heart: RRR no s3, s4, or murmurs. Abdomen: Soft, tender to deep palpation, very distended, BS + x 4.  Extremities: No clubbing, cyanosis or edema. DP/PT/Radials 2+ and equal bilaterally.  Labs   Recent Labs  09/29/14 1108  TROPONINI 0.03   Lab Results  Component Value Date   WBC 13.0* 09/29/2014   HGB 12.5 09/29/2014   HCT 40.1 09/29/2014   MCV 96.4 09/29/2014   PLT 283 09/29/2014     Recent Labs Lab 09/29/14 1107  NA 142  K 3.7  CL 111  CO2 21*  BUN 20  CREATININE 1.21*  CALCIUM 9.1  PROT 7.2  BILITOT 0.3  ALKPHOS 77  ALT 18  AST 22  GLUCOSE 121*   Lab Results  Component Value Date   CHOL 159 01/17/2013   HDL 41 01/17/2013   LDLCALC 82 01/17/2013   TRIG 182* 01/17/2013   No results found for: DDIMER  Radiology/Studies  Ct Abdomen Pelvis W Contrast  09/29/2014   CLINICAL DATA:  Lower abdominal pain  EXAM: CT ABDOMEN AND PELVIS WITH CONTRAST  TECHNIQUE: Multidetector CT imaging of the abdomen and pelvis was performed using the standard  protocol following bolus administration of intravenous contrast.  CONTRAST:  59mL OMNIPAQUE IOHEXOL 300 MG/ML SOLN, 81mL OMNIPAQUE IOHEXOL 300 MG/ML SOLN  COMPARISON:  None.  FINDINGS: Lung bases are free of acute infiltrate or sizable effusion.  The liver, gallbladder, spleen, adrenal glands and pancreas are within normal limits. The kidneys are well visualized and demonstrate right renal cystic change stable from the prior exam. Stable renal calculi are noted on the left. 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 It is stable in appearance from the prior exam. Stable scattered atherosclerotic changes are noted throughout the aortoiliac system.  The colon is somewhat distended with air with area  of wall thickening and narrowing in the sigmoid colon. This has progressed in the interval from the prior exam. Given findings from recent sigmoidoscopy this likely represents progressive smooth muscle thickening. Clinical correlation is recommended. The bladder is decompressed. No pelvic mass lesion is noted. The bony structures demonstrate postoperative change in the lumbar spine.  IMPRESSION: Stable cystic and calculus changes in the kidneys bilaterally.  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  Increased colonic distention with air secondary to an area of narrowing in the sigmoid colon. This likely represents progressive smooth muscle thickening and narrowing. The need for direct visualization can be determined on a clinical basis.   Electronically Signed   By: Inez Catalina M.D.   On: 09/29/2014 14:07    ECG SR, rate 79. TWI in leads 1, aVL, v5, and v6 stable from previous ECG.  (agree)  ASSESSMENT AND PLAN  1. CAD/abnormal nuclear stress test 08/2014: Moderate sized reversible anterior, apical and apical lateral perfusion defect. Findings consistent with ischemia. This was an intermediate risk study. Overall left  ventricular systolic function was abnormal. LV cavity size is normal. The left ventricular ejection fraction is mildly decreased (52%). These findings were new compared to prior study in 09/2012. -Per outpatient evaluation, recommend non-urgent left heart catheterization.  The risks and benefits of the procedure, including but not limited to, bleeding, stroke, MI, dysrhythmia, kidney injury, contrast allergy, and loss of life or limb were discussed with the patient. She voiced understanding and agrees to the procedure. Will plan for right radial approach.  -Restart ASA. Was on 325 mg as outpatient. Will restart 81 mg.  -On low-dose BB due to prior intolerance (bradycardia)  -No heparin drip as this is not ACS. Continue SQ heparin.  -Holding ARB pending LHC.  -Change pravastatin 80 mg to atorvastatin 40 mg daily.   2. HTN: restart home dose amlodipine 10 mg daily.   3. CKD stage 3: continue NS @ 75 mL/hr for pre-cath hydration.   4. HLD: change pravastatin 80 mg to atorvastatin 40 mg as above.   5. COPD/tobacco abuse: cessation advised. Continue nicotine patch.   Signed, Wilhemena Durie, NP-Student 09/30/2014, 12:26 PM   I have seen, examined and evaluated the patient this PM along with Mr. Marylou Mccoy & Mr. Sharolyn Douglas (Supervising NP) .  After reviewing all the available data and chart, we discussed his findings recommendations.  I agree with his findings, examination as well as impression recommendations.  Difficult situation with the patient who has multiple cardiac risk factors including smoking, hypertension and dyslipidemia with peripheral artery disease (not coronary disease) status post subclavian artery stenting by Dr. Kellie Simmering roughly 10 years ago. She is followed by Dr. Percival Spanish, and was seen by the Cecilie Kicks, NP for preoperative evaluation in preparation for surgery to repair a rectovaginal fistula. The patient has chronic dyspnea from COPD and right lower lobectomy, and was therefore sent for  a Lexiscan Myoview for preoperative risk evaluation.  From overall cardiac risk stratification Standpoint Based on the Revised Cardiac Risk Index: She does not have any active cardiac conditions, no heart failure, nondiabetic with insulin, normal renal function and no prior stroke. This would make her a low risk patient for a low risk surgery. Unfortunately she has had a stress test suggesting anterior ischemia with Intermediate RISK (on my review of images -anterior ischemia versus shifting breast attenuation) .  She was planned to undergo outpatient cardiac catheterization this Friday, unfortunately presented with worsening lower  abdominal pain and nausea/hiccoughs, and likely will require surgery more rapidly than originally planned.   On my review of systems for cardiac standpoint, she is not having any active anginal chest pain with rest or exertion, no dyspnea beyond her baseline, no heart failure symptoms of PND, orthopnea or edema or any palpitations/rapid heartbeats.   In the absence of cardiac symptoms, proceeding with scheduled operation would be warranted if needed to be done at urgent/emergent manner. However if we do have time for further risk stratification, based on the fact that there appears to be a possible reversible defect in the anterior wall, further risk stratification with cardiac catheterization would be warranted.  The difficult question will be: If there is a lesion noted on cardiac catheterization, what with the plan the. Unless the lesion is severe (greater than 80% stenosis), my recommendation would be to proceed with planned surgery caution, and then consider revascularization at a later date once she is stable postoperatively. Studies of shown worse outcomes with patients who are revascularized prior to noncardiac surgery is to proceeding with surgery without revascularization.  She is already on beta blocker and statin. I do agree that if she is not able to take by mouth,  she will need to be on IV beta blocker to avoid rebound tachycardia.    I have scheduled her for Cardiac Catheterization tomorrow for Further risk Stratification. More recommendations post Cath.   Leonie Man, M.D., M.S. Interventional Cardiologist   Pager # [phone]

## 2014-09-30 NOTE — Progress Notes (Signed)
Held patient's am meds because she was actively vomiting.

## 2014-09-30 NOTE — Consult Note (Signed)
Alexa Key [birth year]  [id number].   Requesting MD: Dr. Shanon Brow Tat Chief Complaint/Reason for Consult: colovaginal fistula HPI: This is a 64 yo white female who has been seeing Dr. Leighton Ruff in the office for a colovaginal fistula.  She has been having feculent type drainage from her vagina for months now.  She underwent a flex sig several weeks ago, which revealed a benign appearing stricture of her sigmoid colon.  She was referred to cardiology due to a cardiac hx with a stent as well as some SOB with exertion.  She had an abnormal stress test and is in need of a cath prior to surgical clearance.  Last week, she started having an increase in abdominal pain, along with nausea and belching over the weekend.  Her pain continued to progress and she came to the [facility] where she was admitted.  She had a CT scan that reveals increased thickening of her sigmoid colon at the site of her stricture.  She also has increased colonic distention, likely secondary to at least a partial obstruction secondary to this stricture.  She was admitted and is set up for a cardiac catheterization tomorrow.  We have been consulted for further recommendations from a surgical standpoint.  ROS: Please see HPI, otherwise negative.  She is still passing flatus and last BM was Sunday.  These have been increasingly more liquid.  No blood.  Family History  Problem Relation Age of Onset  . Kidney disease Mother     Past Medical History  Diagnosis Date  . CAD (coronary artery disease)     a. 07/2012 s/p MI ->not cath candidate 2/2 AKI; b. 09/2012 Neg MV;  c. 08/2014 [facility]: EF 45-54%, + ischemia, intermittent risk study.  Marland Kitchen PVD (peripheral vascular disease)   . CVA (cerebral infarction)     TIAx2  . HTN (hypertension)   . HLD (hyperlipidemia)   . Tobacco abuse   . Diverticulitis 08/04/12  . S/P partial lobectomy of lung 1997    per [facility], incidental finding, path c/w benign lesion  . Stroke   . COPD (chronic obstructive  pulmonary disease)   . Fibromyalgia   . PONV (postoperative nausea and vomiting)   . Anxiety   . Diabetes mellitus without complication     on no meds   . GERD (gastroesophageal reflux disease)   . H/O echocardiogram     a. 07/2012 Echo: EF 50-55%, grade 2 DD, mild LVH.    Past Surgical History  Procedure Laterality Date  . Hysterectomoy    . Lumbar spine surgery    . Carpal tunnel release    . Coronary angioplasty    . Coronary stents     . Partial lobectomy of lung     . Abdominal hysterectomy    . Back surgery    . Nephrolithotomy Right 12/24/2012    Procedure: NEPHROLITHOTOMY PERCUTANEOUS;  Surgeon: Franchot Gallo, MD;  Location: [facility];  Service: Urology;  Laterality: Right;  . Flexible sigmoidoscopy  07/11/2014    Procedure: FLEXIBLE SIGMOIDOSCOPY;  Surgeon: Leighton Ruff, MD;  Location: [facility];  Service: Endoscopy;;    Social History:  reports that she has been smoking Cigarettes.  She has a 12.25 pack-year smoking history. She has never used smokeless tobacco. She reports that she does not drink alcohol or use illicit drugs.  Allergies:  Allergies  Allergen Reactions  . Penicillins Rash  . Sulfa Antibiotics Rash    Medications Prior to Admission  Medication Sig  Dispense Refill  . acetaminophen (TYLENOL) 500 MG tablet Take 1,000 mg by mouth every 6 (six) hours as needed for pain.    Marland Kitchen albuterol (PROVENTIL HFA;VENTOLIN HFA) 108 (90 BASE) MCG/ACT inhaler Inhale 2 puffs into the lungs every 6 (six) hours as needed for wheezing or shortness of breath. 1 Inhaler 6  . amLODipine (NORVASC) 10 MG tablet Take 1 tablet (10 mg total) by mouth daily. 90 tablet 1  . aspirin 325 MG tablet Take 325 mg by mouth daily.    . budesonide-formoterol (SYMBICORT) 160-4.5 MCG/ACT inhaler Inhale 2 puffs into the lungs 2 (two) times daily. 1 Inhaler 12  . cholecalciferol (VITAMIN D) 1000 UNITS tablet Take 2 tablets (2,000 Units total) by mouth at bedtime. 60 tablet 5  . losartan  (COZAAR) 100 MG tablet Take 1 tablet (100 mg total) by mouth daily. 90 tablet 3  . metoprolol tartrate (LOPRESSOR) 25 MG tablet Take 25 mg by mouth daily.     Marland Kitchen omeprazole (PRILOSEC) 20 MG capsule TAKE 1 CAPSULE (20 MG TOTAL)  BY MOUTH DAILY. 30 capsule 4  . fenofibrate 160 MG tablet Take 1 tablet (160 mg total) by mouth every morning. (Patient not taking: Reported on 09/29/2014) 90 tablet 3  . HYDROcodone-acetaminophen (NORCO/VICODIN) 5-325 MG per tablet Take 1 tablet by mouth every 6 (six) hours as needed for moderate pain. (Patient not taking: Reported on 09/29/2014) 30 tablet 0  . pravastatin (PRAVACHOL) 80 MG tablet Take 1 tablet (80 mg total) by mouth at bedtime. (Patient not taking: Reported on 09/29/2014) 30 tablet 5    Blood pressure 149/54, pulse 69, temperature 98.6 F (37 C), temperature source Oral, resp. rate 18, height '5\' 1"'  (1.549 m), weight 66.497 kg (146 lb 9.6 oz), SpO2 95 %. Physical Exam: General: pleasant, obese white female who is laying in bed in NAD HEENT: head is normocephalic, atraumatic.  Sclera are noninjected.  PERRL.  Ears and nose without any masses or lesions.  Mouth is pink and moist Heart: regular, rate, and rhythm.  Normal s1,s2. No obvious murmurs, gallops, or rubs noted.  Palpable radial and pedal pulses bilaterally Lungs: CTAB, no wheezes, rhonchi, or rales noted.  Respiratory effort nonlabored Abd: soft, obese, but distended, +BS, diffusely tender, but greatest in the LLQ no masses, hernias, or organomegaly MS: all 4 extremities are symmetrical with no cyanosis, clubbing, or edema. Skin: warm and dry with no masses, lesions, or rashes Psych: A&Ox3 with an appropriate affect.    Results for orders placed or performed during the hospital encounter of 09/29/14 (from the past 48 hour(s))  POC CBG, ED     Status: Abnormal   Collection Time: 09/29/14 11:02 AM  Result Value Ref Range   Glucose-Capillary 103 (H) 65 - 99 mg/dL  CBC with Differential     Status:  Abnormal   Collection Time: 09/29/14 11:07 AM  Result Value Ref Range   WBC 13.0 (H) 4.0 - 10.5 K/uL   RBC 4.16 3.87 - 5.11 MIL/uL   Hemoglobin 12.5 12.0 - 15.0 g/dL   HCT 40.1 36.0 - 46.0 %   MCV 96.4 78.0 - 100.0 fL   MCH 30.0 26.0 - 34.0 pg   MCHC 31.2 30.0 - 36.0 g/dL   RDW 14.7 11.5 - 15.5 %   Platelets 283 150 - 400 K/uL   Neutrophils Relative % 79 (H) 43 - 77 %   Neutro Abs 10.3 (H) 1.7 - 7.7 K/uL   Lymphocytes Relative 15 12 - 46 %  Lymphs Abs 1.9 0.7 - 4.0 K/uL   Monocytes Relative 4 3 - 12 %   Monocytes Absolute 0.6 0.1 - 1.0 K/uL   Eosinophils Relative 2 0 - 5 %   Eosinophils Absolute 0.2 0.0 - 0.7 K/uL   Basophils Relative 0 0 - 1 %   Basophils Absolute 0.0 0.0 - 0.1 K/uL  Comprehensive metabolic panel     Status: Abnormal   Collection Time: 09/29/14 11:07 AM  Result Value Ref Range   Sodium 142 135 - 145 mmol/L   Potassium 3.7 3.5 - 5.1 mmol/L   Chloride 111 101 - 111 mmol/L   CO2 21 (L) 22 - 32 mmol/L   Glucose, Bld 121 (H) 65 - 99 mg/dL   BUN 20 6 - 20 mg/dL   Creatinine, Ser 1.21 (H) 0.44 - 1.00 mg/dL   Calcium 9.1 8.9 - 10.3 mg/dL   Total Protein 7.2 6.5 - 8.1 g/dL   Albumin 3.7 3.5 - 5.0 g/dL   AST 22 15 - 41 U/L   ALT 18 14 - 54 U/L   Alkaline Phosphatase 77 38 - 126 U/L   Total Bilirubin 0.3 0.3 - 1.2 mg/dL   GFR calc non Af Amer 46 (L) >60 mL/min   GFR calc Af Amer 53 (L) >60 mL/min    Comment: (NOTE) The eGFR has been calculated using the CKD EPI equation. This calculation has not been validated in all clinical situations. eGFR's persistently <60 mL/min signify possible Chronic Kidney Disease.    Anion gap 10 5 - 15  Lipase, blood     Status: None   Collection Time: 09/29/14 11:08 AM  Result Value Ref Range   Lipase 29 22 - 51 U/L  Troponin I     Status: None   Collection Time: 09/29/14 11:08 AM  Result Value Ref Range   Troponin I 0.03 <0.031 ng/mL    Comment:        NO INDICATION OF MYOCARDIAL INJURY.   Urinalysis, Routine w reflex  microscopic     Status: Abnormal   Collection Time: 09/29/14 12:15 PM  Result Value Ref Range   Color, Urine YELLOW YELLOW   APPearance HAZY (A) CLEAR   Specific Gravity, Urine >1.030 (H) 1.005 - 1.030   pH 5.5 5.0 - 8.0   Glucose, UA NEGATIVE NEGATIVE mg/dL   Hgb urine dipstick NEGATIVE NEGATIVE   Bilirubin Urine NEGATIVE NEGATIVE   Ketones, ur NEGATIVE NEGATIVE mg/dL   Protein, ur 100 (A) NEGATIVE mg/dL   Urobilinogen, UA 0.2 0.0 - 1.0 mg/dL   Nitrite NEGATIVE NEGATIVE   Leukocytes, UA SMALL (A) NEGATIVE  Urine microscopic-add on     Status: Abnormal   Collection Time: 09/29/14 12:15 PM  Result Value Ref Range   Squamous Epithelial / LPF MANY (A) RARE   WBC, UA TOO NUMEROUS TO COUNT <3 WBC/hpf   RBC / HPF 7-10 <3 RBC/hpf   Bacteria, UA MANY (A) RARE  Urinalysis, Routine w reflex microscopic (not at [facility])     Status: Abnormal   Collection Time: 09/29/14  2:05 PM  Result Value Ref Range   Color, Urine YELLOW YELLOW   APPearance HAZY (A) CLEAR   Specific Gravity, Urine <1.005 (L) 1.005 - 1.030   pH 5.5 5.0 - 8.0   Glucose, UA NEGATIVE NEGATIVE mg/dL   Hgb urine dipstick SMALL (A) NEGATIVE   Bilirubin Urine NEGATIVE NEGATIVE   Ketones, ur NEGATIVE NEGATIVE mg/dL   Protein, ur TRACE (A) NEGATIVE mg/dL  Urobilinogen, UA 0.2 0.0 - 1.0 mg/dL   Nitrite NEGATIVE NEGATIVE   Leukocytes, UA MODERATE (A) NEGATIVE  Urine microscopic-add on     Status: Abnormal   Collection Time: 09/29/14  2:05 PM  Result Value Ref Range   Squamous Epithelial / LPF MANY (A) RARE   WBC, UA 21-50 <3 WBC/hpf   RBC / HPF 0-2 <3 RBC/hpf   Bacteria, UA FEW (A) RARE  I-Stat CG4 Lactic Acid, ED     Status: None   Collection Time: 09/29/14  2:06 PM  Result Value Ref Range   Lactic Acid, Venous 1.18 0.5 - 2.0 mmol/L  I-Stat CG4 Lactic Acid, ED     Status: None   Collection Time: 09/29/14  5:11 PM  Result Value Ref Range   Lactic Acid, Venous 0.50 0.5 - 2.0 mmol/L  Glucose, capillary     Status: Abnormal    Collection Time: 09/29/14 10:58 PM  Result Value Ref Range   Glucose-Capillary 100 (H) 65 - 99 mg/dL   Comment 1 Notify RN    Comment 2 Document in Chart   Glucose, capillary     Status: Abnormal   Collection Time: 09/30/14  6:00 AM  Result Value Ref Range   Glucose-Capillary 127 (H) 65 - 99 mg/dL   Ct Abdomen Pelvis W Contrast  09/29/2014   CLINICAL DATA:  Lower abdominal pain  EXAM: CT ABDOMEN AND PELVIS WITH CONTRAST  TECHNIQUE: Multidetector CT imaging of the abdomen and pelvis was performed using the standard protocol following bolus administration of intravenous contrast.  CONTRAST:  3m OMNIPAQUE IOHEXOL 300 MG/ML SOLN, 868mOMNIPAQUE IOHEXOL 300 MG/ML SOLN  COMPARISON:  None.  FINDINGS: Lung bases are free of acute infiltrate or sizable effusion.  The liver, gallbladder, spleen, adrenal glands and pancreas are within normal limits. The kidneys are well visualized and demonstrate right renal cystic change stable from the prior exam. Stable renal calculi are noted on the left. 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 It is stable in appearance from the prior exam. Stable scattered atherosclerotic changes are noted throughout the aortoiliac system.  The colon is somewhat distended with air with area of wall thickening and narrowing in the sigmoid colon. This has progressed in the interval from the prior exam. Given findings from recent sigmoidoscopy this likely represents progressive smooth muscle thickening. Clinical correlation is recommended. The bladder is decompressed. No pelvic mass lesion is noted. The bony structures demonstrate postoperative change in the lumbar spine.  IMPRESSION: Stable cystic and calculus changes in the kidneys bilaterally.  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  Increased colonic distention with air secondary to an area of narrowing in the sigmoid colon. This likely  represents progressive smooth muscle thickening and narrowing. The need for direct visualization can be determined on a clinical basis.   Electronically Signed   By: MaInez Catalina.D.   On: 09/29/2014 14:07       Assessment/Plan Partial Colonic obstruction secondary to benign stricture with colovaginal fistula  This stricture and fistula are felt to be secondary to diverticular disease, for which she has a history.  The patient is becoming increasingly distended, nauseated, and with more pain.  Her stricture is likely becoming worse and she has developed a symptomatic at least partial colonic obstruction.  She is undergoing a cardiac cath tomorrow. If this is clean, then we will need to plan for a colectomy/colostomy  this admission.  If her cath shows disease that needs to be addressed urgently, we may need to have GI see the patient to try and place a stent temporarily for decompression.  Await cath results for further recommendations.  We would not recommend feeding the patient much given her increasing distention and nausea.  Will follow closely.  Samwise Eckardt E 09/30/2014, 5:42 PM Pager: [phone]

## 2014-09-30 NOTE — Progress Notes (Signed)
UR COMPLETED  

## 2014-10-01 ENCOUNTER — Inpatient Hospital Stay (HOSPITAL_COMMUNITY): Payer: Commercial Managed Care - HMO

## 2014-10-01 ENCOUNTER — Encounter (HOSPITAL_COMMUNITY)
Admission: EM | Disposition: A | Discharge status: Skilled Nursing Facility | Payer: Self-pay | Source: Home / Self Care | Attending: Internal Medicine

## 2014-10-01 DIAGNOSIS — I2510 Atherosclerotic heart disease of native coronary artery without angina pectoris: Secondary | ICD-10-CM

## 2014-10-01 DIAGNOSIS — N179 Acute kidney failure, unspecified: Secondary | ICD-10-CM | POA: Insufficient documentation

## 2014-10-01 DIAGNOSIS — K56609 Unspecified intestinal obstruction, unspecified as to partial versus complete obstruction: Secondary | ICD-10-CM | POA: Diagnosis present

## 2014-10-01 DIAGNOSIS — K5669 Other intestinal obstruction: Secondary | ICD-10-CM

## 2014-10-01 LAB — CBC
HEMATOCRIT: 39.3 % (ref 36.0–46.0)
Hemoglobin: 12.4 g/dL (ref 12.0–15.0)
MCH: 30.2 pg (ref 26.0–34.0)
MCHC: 31.6 g/dL (ref 30.0–36.0)
MCV: 95.6 fL (ref 78.0–100.0)
Platelets: 315 10*3/uL (ref 150–400)
RBC: 4.11 MIL/uL (ref 3.87–5.11)
RDW: 15.1 % (ref 11.5–15.5)
WBC: 14.5 10*3/uL — AB (ref 4.0–10.5)

## 2014-10-01 LAB — URINE CULTURE: Colony Count: >=100000

## 2014-10-01 LAB — URINALYSIS, ROUTINE W REFLEX MICROSCOPIC
GLUCOSE, UA: NEGATIVE mg/dL
Ketones, ur: NEGATIVE mg/dL
Nitrite: NEGATIVE
Protein, ur: 30 mg/dL — AB
Specific Gravity, Urine: 1.021 (ref 1.005–1.030)
Urobilinogen, UA: 1 mg/dL (ref 0.0–1.0)
pH: 5 (ref 5.0–8.0)

## 2014-10-01 LAB — GLUCOSE, CAPILLARY
GLUCOSE-CAPILLARY: 90 mg/dL (ref 65–99)
Glucose-Capillary: 107 mg/dL — ABNORMAL HIGH (ref 65–99)
Glucose-Capillary: 115 mg/dL — ABNORMAL HIGH (ref 65–99)

## 2014-10-01 LAB — BASIC METABOLIC PANEL
Anion gap: 13 (ref 5–15)
BUN: 38 mg/dL — AB (ref 6–20)
CHLORIDE: 105 mmol/L (ref 101–111)
CO2: 22 mmol/L (ref 22–32)
Calcium: 8.4 mg/dL — ABNORMAL LOW (ref 8.9–10.3)
Creatinine, Ser: 2.59 mg/dL — ABNORMAL HIGH (ref 0.44–1.00)
GFR calc Af Amer: 21 mL/min — ABNORMAL LOW (ref >60)
GFR calc non Af Amer: 18 mL/min — ABNORMAL LOW (ref >60)
Glucose, Bld: 122 mg/dL — ABNORMAL HIGH (ref 65–99)
Potassium: 4.1 mmol/L (ref 3.5–5.1)
Sodium: 140 mmol/L (ref 135–145)

## 2014-10-01 LAB — URINE MICROSCOPIC-ADD ON

## 2014-10-01 LAB — PROTIME-INR
INR: 1.12 (ref 0.00–1.49)
Prothrombin Time: 14.6 seconds (ref 11.6–15.2)

## 2014-10-01 LAB — PREALBUMIN: Prealbumin: 16.6 mg/dL — ABNORMAL LOW (ref 18–38)

## 2014-10-01 LAB — CREATININE, URINE, RANDOM: CREATININE, URINE: 179.32 mg/dL

## 2014-10-01 LAB — SODIUM, URINE, RANDOM: SODIUM UR: 19 mmol/L

## 2014-10-01 SURGERY — LEFT HEART CATH AND CORONARY ANGIOGRAPHY
Anesthesia: LOCAL

## 2014-10-01 MED ORDER — PANTOPRAZOLE SODIUM 40 MG IV SOLR
40.0000 mg | INTRAVENOUS | Status: DC
  Administered 2014-10-01 – 2014-10-06 (×6): 40 mg via INTRAVENOUS
  Filled 2014-10-01 (×7): qty 40

## 2014-10-01 MED ORDER — PHENOL 1.4 % MT LIQD
1.0000 | OROMUCOSAL | Status: DC | PRN
  Filled 2014-10-01: qty 177

## 2014-10-01 MED ORDER — MENTHOL 3 MG MT LOZG
1.0000 | LOZENGE | OROMUCOSAL | Status: DC | PRN
  Filled 2014-10-01: qty 9

## 2014-10-01 MED ORDER — LEVOFLOXACIN IN D5W 250 MG/50ML IV SOLN
250.0000 mg | INTRAVENOUS | Status: DC
  Filled 2014-10-01: qty 50

## 2014-10-01 MED ORDER — GENTAMICIN IN SALINE 1-0.9 MG/ML-% IV SOLN
100.0000 mg | INTRAVENOUS | Status: AC
Start: 2014-10-02 — End: 2014-10-02
  Administered 2014-10-02 (×2): 100 mg via INTRAVENOUS
  Filled 2014-10-01: qty 100

## 2014-10-01 MED ORDER — SODIUM CHLORIDE 0.9 % IV SOLN
INTRAVENOUS | Status: DC
  Administered 2014-10-02 – 2014-10-03 (×3): via INTRAVENOUS

## 2014-10-01 MED ORDER — CLINDAMYCIN PHOSPHATE 900 MG/50ML IV SOLN
900.0000 mg | INTRAVENOUS | Status: AC
  Administered 2014-10-02: 900 mg via INTRAVENOUS
  Filled 2014-10-01: qty 50

## 2014-10-01 MED ORDER — LEVOFLOXACIN IN D5W 500 MG/100ML IV SOLN
500.0000 mg | Freq: Once | INTRAVENOUS | Status: AC
  Administered 2014-10-02: 500 mg via INTRAVENOUS
  Filled 2014-10-01: qty 100

## 2014-10-01 MED ORDER — HYDROMORPHONE HCL 1 MG/ML IJ SOLN
1.0000 mg | INTRAMUSCULAR | Status: DC | PRN
  Administered 2014-10-01 – 2014-10-03 (×6): 1 mg via INTRAVENOUS
  Filled 2014-10-01 (×6): qty 1

## 2014-10-01 MED ORDER — PROMETHAZINE HCL 25 MG/ML IJ SOLN
12.5000 mg | INTRAMUSCULAR | Status: DC | PRN
  Administered 2014-10-06 – 2014-10-13 (×3): 12.5 mg via INTRAVENOUS
  Filled 2014-10-01 (×5): qty 1

## 2014-10-01 MED ORDER — METOCLOPRAMIDE HCL 5 MG/ML IJ SOLN
10.0000 mg | Freq: Once | INTRAMUSCULAR | Status: AC
Start: 2014-10-01 — End: 2014-10-01
  Administered 2014-10-01: 10 mg via INTRAVENOUS
  Filled 2014-10-01: qty 2

## 2014-10-01 MED ORDER — PROMETHAZINE HCL 25 MG/ML IJ SOLN
12.5000 mg | Freq: Three times a day (TID) | INTRAMUSCULAR | Status: AC
  Administered 2014-10-01 – 2014-10-02 (×5): 12.5 mg via INTRAVENOUS
  Filled 2014-10-01 (×5): qty 1

## 2014-10-01 MED ORDER — LORAZEPAM 2 MG/ML IJ SOLN
1.0000 mg | Freq: Once | INTRAMUSCULAR | Status: AC
  Administered 2014-10-01: 1 mg via INTRAVENOUS
  Filled 2014-10-01: qty 1

## 2014-10-01 NOTE — Progress Notes (Signed)
Patient continued to vomit and dry heave after given IV reglan per order. Therefore asked patient if she was opened to aromatherapy for I am certified through [employer]. Patient agreed. Peppermint essential oil that we keep on the [facility] floor was applied to wrist and temples of forehead and was effective. Will continue to monitor patient to end of shift.

## 2014-10-01 NOTE — Progress Notes (Signed)
Reported that patient has been vomiting versus dry heaving all day during the day and it has very well carried on and occurred during the night. Patient now has had two doses of phenergan and continues to have extreme dry heaves along with vomiting noted. Emesis is a dark greenish color. Notified hospitalist that of her vomiting/dry heaving occurring more frequently.Orders given and will carry out as ordered. Will continue to monitor to end of shift,

## 2014-10-01 NOTE — Progress Notes (Signed)
Chaplain respond to code  Patient's daughter wanted a notary.  Acknowledge the patient , however patient was engaged with her nurse.  Will follow- up as needed.   10/01/14 1800  Clinical Encounter Type  Visited With Patient and family together;Health care provider  Visit Type Spiritual support;Code  Referral From Chaplain  Consult/Referral To Nurse

## 2014-10-01 NOTE — Progress Notes (Signed)
Subjective: She is tender and really having allot of nausea and vomiting.  No BM since Sunday, 4 days ago. Pain is getting worse.  Objective: Vital signs in last 24 hours: Temp:  [98.4 F (36.9 C)-98.6 F (37 C)] 98.4 F (36.9 C) (05/31 1833) Pulse Rate:  [60-84] 70 (06/01 1023) Resp:  [15-20] 18 (06/01 1023) BP: (117-167)/(42-63) 136/43 mmHg (06/01 1023) SpO2:  [94 %-98 %] 98 % (06/01 1023) Weight:  [66.679 kg (147 lb)] 66.679 kg (147 lb) (06/01 0602) Last BM Date: 09/28/14 Emesis x 7 noted Afebrile, VSS Creatinine 1.21 yesterday WBC up some CT 09/29/14:  Increased colonic distention with air secondary to an area of narrowing in the sigmoid colon. This likely represents progressive smooth muscle thickening and narrowing.    Intake/Output from previous day: 05/31 0701 - 06/01 0700 In: 1903 [P.O.:100; I.V.:1803] Out: 350 [Urine:350] Intake/Output this shift: Total I/O In: 10 [P.O.:10] Out: -   General appearance: alert, cooperative and mild distress GI: she is very tender, not many bowel sounds.  No BM for 4 day.  Lab Results:   Recent Labs  09/29/14 1107 10/01/14 0942  WBC 13.0* 14.5*  HGB 12.5 12.4  HCT 40.1 39.3  PLT 283 315    BMET  Recent Labs  09/29/14 1107  NA 142  K 3.7  CL 111  CO2 21*  GLUCOSE 121*  BUN 20  CREATININE 1.21*  CALCIUM 9.1   PT/INR  Recent Labs  09/30/14 2300  LABPROT 14.6  INR 1.12     Recent Labs Lab 09/29/14 1107  AST 22  ALT 18  ALKPHOS 77  BILITOT 0.3  PROT 7.2  ALBUMIN 3.7     Lipase     Component Value Date/Time   LIPASE 29 09/29/2014 1108     Studies/Results: Ct Abdomen Pelvis W Contrast  09/29/2014   CLINICAL DATA:  Lower abdominal pain  EXAM: CT ABDOMEN AND PELVIS WITH CONTRAST  TECHNIQUE: Multidetector CT imaging of the abdomen and pelvis was performed using the standard protocol following bolus administration of intravenous contrast.  CONTRAST:  109mL OMNIPAQUE IOHEXOL 300 MG/ML SOLN, 58mL  OMNIPAQUE IOHEXOL 300 MG/ML SOLN  COMPARISON:  None.  FINDINGS: Lung bases are free of acute infiltrate or sizable effusion.  The liver, gallbladder, spleen, adrenal glands and pancreas are within normal limits. The kidneys are well visualized and demonstrate right renal cystic change stable from the prior exam. Stable renal calculi are noted on the left. 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 It is stable in appearance from the prior exam. Stable scattered atherosclerotic changes are noted throughout the aortoiliac system.  The colon is somewhat distended with air with area of wall thickening and narrowing in the sigmoid colon. This has progressed in the interval from the prior exam. Given findings from recent sigmoidoscopy this likely represents progressive smooth muscle thickening. Clinical correlation is recommended. The bladder is decompressed. No pelvic mass lesion is noted. The bony structures demonstrate postoperative change in the lumbar spine.  IMPRESSION: Stable cystic and calculus changes in the kidneys bilaterally.  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  Increased colonic distention with air secondary to an area of narrowing in the sigmoid colon. This likely represents progressive smooth muscle thickening and narrowing. The need for direct visualization can be determined on a clinical basis.   Electronically Signed   By: Inez Catalina M.D.   On: 09/29/2014  14:07    Medications: . budesonide-formoterol  2 puff Inhalation BID  . heparin  5,000 Units Subcutaneous 3 times per day  . metoprolol  2.5 mg Intravenous 4 times per day  . nicotine  7 mg Transdermal Daily  . pantoprazole  40 mg Oral Daily  . pneumococcal 23 valent vaccine  0.5 mL Intramuscular Tomorrow-1000  . pravastatin  80 mg Oral QHS  . promethazine  12.5 mg Intravenous 3 times per day  . senna  2 tablet Oral BID  . sodium  chloride  3 mL Intravenous Q12H    Assessment/Plan Partial Colonic obstruction secondary to benign stricture with colovaginal fistula CAD for cath today Hx o f CVA PVOD Tobacco use, ongoing for 50 years Hypertension  Prior lobectomy  Fibromyalgia AODM GERD UTI - culture pending Hyperlipidemia No antibiotics DVT:  Heparin/SCD's added    Plan:  Cardiac cath today, I am think she needs an NG, NPO, check prealbumin, ? Antibiotics with increasing tenderness and obvious UTI.  I will discuss with Dr. Brantley Stage.  LOS: 2 days    Myshawn Chir[place]oga 10/01/2014

## 2014-10-01 NOTE — Progress Notes (Signed)
[facility] PROGRESS NOTE  Alexa Key [id number] DOB: Dec 24, 1950 DOA: 09/29/2014 PCP: [name], PA-C  Assessment/Plan: #1 partial colonic obstruction secondary to benign sigmoid stricture with colovaginal fistula Patient with no significant improvement. Patient complaining of nausea and emesis. Patient was also have a cardiac catheterization however due to worsening renal function this has been canceled. Patient is moderate risk for surgery. Patient is being followed by cardiology and risk and benefits of surgery have been explained. Patient in agreement to proceed with surgery. Patient for probable colectomy/colostomy tomorrow per general surgery.  #2 coronary artery disease with intermediate risk Myoview Patient denies any chest or no shortness of breath. Patient had an abnormal Myoview and was subsequently scheduled for cardiac catheterization which was to be done to day however due to worsening renal function cardiac catheterization has been canceled. Per cardiology.  #3 acute on chronic kidney disease III ( baseline creatinine 1.2-1.5) Questionable etiology. Differential includes prerenal versus renal secondary to ATN. Patient would recent CT scan done with contrast. Patient also with small bowel obstruction with several episodes of emesis and nausea with poor oral intake. Patient does not look volume overloaded on examination. Will check a fractional excretion of sodium. Renal ultrasound negative for hydronephrosis. Place on IV fluids. Follow.  #4 hypertension Stable. Continue current regimen of IV Lopressor.   #5 COPD Stable. continue Symbicort.   #6 colovaginal fistula Per general surgery.  #7 pyuria/bacteriuria Patient with some complaints of dysuria and abdominal pain. Repeat urinalysis has been ordered. Place empirically on IV Levaquin.  #8 tobacco abuse Nicotine patch. Tobacco cessation.  #9 prophylaxis PPI for GI prophylaxis. Heparin for DVT prophylaxis.     Code Status: Full Family Communication: Updated patient and daughter at bedside. Disposition Plan: Home when medically stable.   Consultants:  Gen. surgery: Dr. Brantley Stage 09/30/2014  Cardiology: Dr. Wilhemina Cash 09/30/2014  Procedures:  CT abdomen and pelvis 09/29/2014  Renal ultrasound 10/01/2014  Antibiotics:  IV Levaquin 10/01/2014   HPI/Subjective: Patient complaining of nausea and some emesis this morning.patient complaining of dysuria.   Objective: Filed Vitals:   10/01/14 1023  BP: 136/43  Pulse: 70  Temp:   Resp: 18    Intake/Output Summary (Last 24 hours) at 10/01/14 1039 Last data filed at 10/01/14 1022  Gross per 24 hour  Intake   1613 ml  Output    350 ml  Net   1263 ml   Filed Weights   09/29/14 1900 09/30/14 0534 10/01/14 0602  Weight: 65.409 kg (144 lb 3.2 oz) 66.497 kg (146 lb 9.6 oz) 66.679 kg (147 lb)    Exam:   General:  NAD  Cardiovascular: RRR  Respiratory: CTAB  Abdomen: Soft, diffuse tenderness to palpation, hypoactive bowel sounds,   Musculoskeletal: No clubbing cyanosis or edema.  Data Reviewed: Basic Metabolic Panel:  Recent Labs Lab 09/29/14 1107  NA 142  K 3.7  CL 111  CO2 21*  GLUCOSE 121*  BUN 20  CREATININE 1.21*  CALCIUM 9.1   Liver Function Tests:  Recent Labs Lab 09/29/14 1107  AST 22  ALT 18  ALKPHOS 77  BILITOT 0.3  PROT 7.2  ALBUMIN 3.7    Recent Labs Lab 09/29/14 1108  LIPASE 29   No results for input(s): AMMONIA in the last 168 hours. CBC:  Recent Labs Lab 09/29/14 1107 10/01/14 0942  WBC 13.0* 14.5*  NEUTROABS 10.3*  --   HGB 12.5 12.4  HCT 40.1 39.3  MCV 96.4 95.6  PLT 283 315  Cardiac Enzymes:  Recent Labs Lab 09/29/14 1108  TROPONINI 0.03   BNP (last 3 results) No results for input(s): BNP in the last 8760 hours.  ProBNP (last 3 results) No results for input(s): PROBNP in the last 8760 hours.  CBG:  Recent Labs Lab 09/29/14 1102 09/29/14 2258  09/30/14 0600 10/01/14 0010 10/01/14 0404  GLUCAP 103* 100* 127* 115* 107*    Recent Results (from the past 240 hour(s))  Culture, blood (routine x 2)     Status: None (Preliminary result)   Collection Time: 09/30/14 12:32 PM  Result Value Ref Range Status   Specimen Description BLOOD LEFT HAND  Final   Special Requests BOTTLES DRAWN AEROBIC ONLY 10CC  Final   Culture   Final           BLOOD CULTURE RECEIVED NO GROWTH TO DATE CULTURE WILL BE HELD FOR 5 DAYS BEFORE ISSUING A FINAL NEGATIVE REPORT Performed at [facility]    Report Status PENDING  Incomplete  Culture, blood (routine x 2)     Status: None (Preliminary result)   Collection Time: 09/30/14 12:41 PM  Result Value Ref Range Status   Specimen Description BLOOD RIGHT HAND  Final   Special Requests BOTTLES DRAWN AEROBIC ONLY 8CC  Final   Culture   Final           BLOOD CULTURE RECEIVED NO GROWTH TO DATE CULTURE WILL BE HELD FOR 5 DAYS BEFORE ISSUING A FINAL NEGATIVE REPORT Performed at [facility]    Report Status PENDING  Incomplete     Studies: Ct Abdomen Pelvis W Contrast  09/29/2014   CLINICAL DATA:  Lower abdominal pain  EXAM: CT ABDOMEN AND PELVIS WITH CONTRAST  TECHNIQUE: Multidetector CT imaging of the abdomen and pelvis was performed using the standard protocol following bolus administration of intravenous contrast.  CONTRAST:  70mL OMNIPAQUE IOHEXOL 300 MG/ML SOLN, 20mL OMNIPAQUE IOHEXOL 300 MG/ML SOLN  COMPARISON:  None.  FINDINGS: Lung bases are free of acute infiltrate or sizable effusion.  The liver, gallbladder, spleen, adrenal glands and pancreas are within normal limits. The kidneys are well visualized and demonstrate right renal cystic change stable from the prior exam. Stable renal calculi are noted on the left. 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 It is stable in appearance from the prior exam. Stable scattered atherosclerotic  changes are noted throughout the aortoiliac system.  The colon is somewhat distended with air with area of wall thickening and narrowing in the sigmoid colon. This has progressed in the interval from the prior exam. Given findings from recent sigmoidoscopy this likely represents progressive smooth muscle thickening. Clinical correlation is recommended. The bladder is decompressed. No pelvic mass lesion is noted. The bony structures demonstrate postoperative change in the lumbar spine.  IMPRESSION: Stable cystic and calculus changes in the kidneys bilaterally.  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  Increased colonic distention with air secondary to an area of narrowing in the sigmoid colon. This likely represents progressive smooth muscle thickening and narrowing. The need for direct visualization can be determined on a clinical basis.   Electronically Signed   By: Inez Catalina M.D.   On: 09/29/2014 14:07    Scheduled Meds: . budesonide-formoterol  2 puff Inhalation BID  . heparin  5,000 Units Subcutaneous 3 times per day  . metoprolol  2.5 mg Intravenous 4 times per day  .  nicotine  7 mg Transdermal Daily  . pantoprazole  40 mg Oral Daily  . pneumococcal 23 valent vaccine  0.5 mL Intramuscular Tomorrow-1000  . pravastatin  80 mg Oral QHS  . promethazine  12.5 mg Intravenous 3 times per day  . senna  2 tablet Oral BID  . sodium chloride  3 mL Intravenous Q12H   Continuous Infusions: . sodium chloride Stopped (10/01/14 0624)  . sodium chloride 1 mL/kg/hr (10/01/14 0719)    Principal Problem:   Colonic obstruction; Partial, secondary to benign sigmoid colonic stricture Active Problems:   Tobacco user   Rectovaginal fistula   Essential hypertension   COPD (chronic obstructive pulmonary disease)   Abdominal pain   CAD in native artery   CKD (chronic kidney disease) stage 3, GFR 30-59 ml/min   GERD (gastroesophageal reflux disease)   Bacteriuria    Abnormal nuclear stress test   Pre-operative cardiovascular examination, myocardial ischemia    Time spent: 92 minutes    THOMPSON,DANIEL M.D. [facility] Pager [phone]. If 7PM-7AM, please contact night-coverage at [email or web address], password [facility] 10/01/2014, 10:39 AM  LOS: 2 days

## 2014-10-01 NOTE — Progress Notes (Signed)
NG tube inserted into left nare after pre medications given per orders.  NG tube went in with no problems.  NG tube hooked up to low intermittent suction per orders and brown liquid draining from tube.  Patient tolerated procedure well.  Will continue to monitor.

## 2014-10-01 NOTE — Progress Notes (Signed)
TELEMETRY: Reviewed telemetry pt in NSR: Filed Vitals:   10/01/14 0104 10/01/14 0600 10/01/14 0602 10/01/14 1023  BP: 139/63 167/56  136/43  Pulse: 70 80  70  Temp:      TempSrc:      Resp:  20  18  Height:      Weight:   66.679 kg (147 lb)   SpO2:    98%    Intake/Output Summary (Last 24 hours) at 10/01/14 1130 Last data filed at 10/01/14 1022  Gross per 24 hour  Intake   1613 ml  Output    350 ml  Net   1263 ml   Filed Weights   09/29/14 1900 09/30/14 0534 10/01/14 0602  Weight: 65.409 kg (144 lb 3.2 oz) 66.497 kg (146 lb 9.6 oz) 66.679 kg (147 lb)    Subjective Persistent nausea, vomiting, and abdominal pain. No chest pain or dyspnea  . budesonide-formoterol  2 puff Inhalation BID  . heparin  5,000 Units Subcutaneous 3 times per day  . metoprolol  2.5 mg Intravenous 4 times per day  . nicotine  7 mg Transdermal Daily  . pantoprazole  40 mg Oral Daily  . pneumococcal 23 valent vaccine  0.5 mL Intramuscular Tomorrow-1000  . pravastatin  80 mg Oral QHS  . promethazine  12.5 mg Intravenous 3 times per day  . senna  2 tablet Oral BID  . sodium chloride  3 mL Intravenous Q12H   . sodium chloride    . sodium chloride 1 mL/kg/hr (10/01/14 0719)    LABS: Basic Metabolic Panel:  Recent Labs  09/29/14 1107 10/01/14 0942  NA 142 140  K 3.7 4.1  CL 111 105  CO2 21* 22  GLUCOSE 121* 122*  BUN 20 38*  CREATININE 1.21* 2.59*  CALCIUM 9.1 8.4*   Liver Function Tests:  Recent Labs  09/29/14 1107  AST 22  ALT 18  ALKPHOS 77  BILITOT 0.3  PROT 7.2  ALBUMIN 3.7    Recent Labs  09/29/14 1108  LIPASE 29   CBC:  Recent Labs  09/29/14 1107 10/01/14 0942  WBC 13.0* 14.5*  NEUTROABS 10.3*  --   HGB 12.5 12.4  HCT 40.1 39.3  MCV 96.4 95.6  PLT 283 315   Cardiac Enzymes:  Recent Labs  09/29/14 1108  TROPONINI 0.03   BNP: No results for input(s): PROBNP in the last 72 hours. D-Dimer: No results for input(s): DDIMER in the last 72  hours. Hemoglobin A1C: No results for input(s): HGBA1C in the last 72 hours. Fasting Lipid Panel: No results for input(s): CHOL, HDL, LDLCALC, TRIG, CHOLHDL, LDLDIRECT in the last 72 hours. Thyroid Function Tests: No results for input(s): TSH, T4TOTAL, T3FREE, THYROIDAB in the last 72 hours.  Invalid input(s): FREET3   Radiology/Studies:  Ct Abdomen Pelvis W Contrast  09/29/2014   CLINICAL DATA:  Lower abdominal pain  EXAM: CT ABDOMEN AND PELVIS WITH CONTRAST  TECHNIQUE: Multidetector CT imaging of the abdomen and pelvis was performed using the standard protocol following bolus administration of intravenous contrast.  CONTRAST:  60mL OMNIPAQUE IOHEXOL 300 MG/ML SOLN, 52mL OMNIPAQUE IOHEXOL 300 MG/ML SOLN  COMPARISON:  None.  FINDINGS: Lung bases are free of acute infiltrate or sizable effusion.  The liver, gallbladder, spleen, adrenal glands and pancreas are within normal limits. The kidneys are well visualized and demonstrate right renal cystic change stable from the prior exam. Stable renal calculi are noted on the left. 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 It is stable in appearance from the prior exam. Stable scattered atherosclerotic changes are noted throughout the aortoiliac system.  The colon is somewhat distended with air with area of wall thickening and narrowing in the sigmoid colon. This has progressed in the interval from the prior exam. Given findings from recent sigmoidoscopy this likely represents progressive smooth muscle thickening. Clinical correlation is recommended. The bladder is decompressed. No pelvic mass lesion is noted. The bony structures demonstrate postoperative change in the lumbar spine.  IMPRESSION: Stable cystic and calculus changes in the kidneys bilaterally.  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  Increased colonic distention with air secondary to an area of  narrowing in the sigmoid colon. This likely represents progressive smooth muscle thickening and narrowing. The need for direct visualization can be determined on a clinical basis.   Electronically Signed   By: Inez Catalina M.D.   On: 09/29/2014 14:07   Ecg 09/29/14: NSR with poor R wave transition. Nonspecific ST abnormality.   PHYSICAL EXAM General: Obese WF actively wretching. Head: Normal Neck: Negative for carotid bruits. JVD not elevated. No adenopathy Lungs: Clear bilaterally to auscultation without wheezes, rales, or rhonchi. Breathing is unlabored. Heart: RRR S1 S2 without murmurs, rubs, or gallops.  Abdomen: distended. Soft. Some tenderness to palpation.  Msk:  Strength and tone appears normal for age. Extremities: No clubbing, cyanosis or edema.  Distal pedal pulses are 2+ and equal bilaterally. Neuro: Alert and oriented X 3. Moves all extremities spontaneously. Psych:  Responds to questions appropriately with a normal affect.  ASSESSMENT AND PLAN: 1. CAD with intermediate risk Myoview. Patient is asymptomatic from a cardiac standpoint. She was originally scheduled for outpatient cath but developed protracted N/V and was admitted. She has a rectovaginal fistula and partial colectomy planned. Per Dr. Allison Quarry note yesterday Cardiac cath planned today to further assess risk but this would be unlikely to change therapy. Today she has significant decline in renal function with creatinine 1.2>>2.59 in 2 days. Cardiac cath cancelled. At this point I do not see an advantage to doing a cardiac cath in the acute setting. I think her cardiac risk is moderate based on noninvasive evaluation and since she is asymptomatic I would recommend proceeding with surgery if needed. Cardiac cath would likely only increase the risk of renal failure and bad outcomes. Perhaps in the future when she has recovered from surgery and renal function is stable cardiac cath could be considered to assess her long term  risk.  2. Acute on chronic renal failure. She did receive IV contrast 2 days ago for CT. Also likely dehydrated due to protracted nausea and vomiting.   3. HTN  4. COPD  5. Rectovaginal fistula.   We will sign off for now. Please call with questions.   Present on Admission:  . Abdominal pain . Rectovaginal fistula . CAD in native artery . CKD (chronic kidney disease) stage 3, GFR 30-59 ml/min . COPD (chronic obstructive pulmonary disease) . Essential hypertension . Tobacco user . GERD (gastroesophageal reflux disease) . Bacteriuria . Abnormal nuclear stress test . Pre-operative cardiovascular examination, myocardial ischemia . Colonic obstruction; Partial, secondary to benign sigmoid colonic stricture  Signed, Shiquan Mathieu [place], [place] 10/01/2014 11:30 AM

## 2014-10-02 ENCOUNTER — Inpatient Hospital Stay (HOSPITAL_COMMUNITY): Payer: Commercial Managed Care - HMO | Admitting: Critical Care Medicine

## 2014-10-02 ENCOUNTER — Encounter (HOSPITAL_COMMUNITY)
Admission: EM | Disposition: A | Discharge status: Skilled Nursing Facility | Payer: Self-pay | Source: Home / Self Care | Attending: Internal Medicine

## 2014-10-02 ENCOUNTER — Encounter (HOSPITAL_COMMUNITY): Payer: Self-pay | Admitting: Critical Care Medicine

## 2014-10-02 DIAGNOSIS — J439 Emphysema, unspecified: Secondary | ICD-10-CM

## 2014-10-02 DIAGNOSIS — N3000 Acute cystitis without hematuria: Secondary | ICD-10-CM | POA: Insufficient documentation

## 2014-10-02 HISTORY — PX: LAPAROTOMY: SHX154

## 2014-10-02 LAB — SURGICAL PCR SCREEN
MRSA, PCR: NEGATIVE
Staphylococcus aureus: NEGATIVE

## 2014-10-02 LAB — BASIC METABOLIC PANEL
Anion gap: 10 (ref 5–15)
BUN: 48 mg/dL — AB (ref 6–20)
CO2: 23 mmol/L (ref 22–32)
CREATININE: 2.6 mg/dL — AB (ref 0.44–1.00)
Calcium: 7.3 mg/dL — ABNORMAL LOW (ref 8.9–10.3)
Chloride: 108 mmol/L (ref 101–111)
GFR calc Af Amer: 21 mL/min — ABNORMAL LOW (ref >60)
GFR, EST NON AFRICAN AMERICAN: 18 mL/min — AB (ref >60)
Glucose, Bld: 84 mg/dL (ref 65–99)
Potassium: 4 mmol/L (ref 3.5–5.1)
Sodium: 141 mmol/L (ref 135–145)

## 2014-10-02 LAB — CBC
HCT: 33.6 % — ABNORMAL LOW (ref 36.0–46.0)
Hemoglobin: 10.7 g/dL — ABNORMAL LOW (ref 12.0–15.0)
MCH: 30.5 pg (ref 26.0–34.0)
MCHC: 31.8 g/dL (ref 30.0–36.0)
MCV: 95.7 fL (ref 78.0–100.0)
Platelets: 243 10*3/uL (ref 150–400)
RBC: 3.51 MIL/uL — ABNORMAL LOW (ref 3.87–5.11)
RDW: 15.1 % (ref 11.5–15.5)
WBC: 9.8 10*3/uL (ref 4.0–10.5)

## 2014-10-02 LAB — GLUCOSE, CAPILLARY
GLUCOSE-CAPILLARY: 82 mg/dL (ref 65–99)
Glucose-Capillary: 74 mg/dL (ref 65–99)
Glucose-Capillary: 159 mg/dL — ABNORMAL HIGH (ref 65–99)

## 2014-10-02 SURGERY — LAPAROTOMY, EXPLORATORY
Anesthesia: General | Site: Abdomen

## 2014-10-02 MED ORDER — GLYCOPYRROLATE 0.2 MG/ML IJ SOLN
INTRAMUSCULAR | Status: DC | PRN
Start: 2014-10-02 — End: 2014-10-02
  Administered 2014-10-02: .6 mg via INTRAVENOUS

## 2014-10-02 MED ORDER — GLYCOPYRROLATE 0.2 MG/ML IJ SOLN
INTRAMUSCULAR | Status: AC
  Filled 2014-10-02: qty 3

## 2014-10-02 MED ORDER — HYDROMORPHONE HCL 1 MG/ML IJ SOLN
0.5000 mg | INTRAMUSCULAR | Status: AC | PRN
  Administered 2014-10-02 (×4): 0.5 mg via INTRAVENOUS

## 2014-10-02 MED ORDER — 0.9 % SODIUM CHLORIDE (POUR BTL) OPTIME
TOPICAL | Status: DC | PRN
  Administered 2014-10-02 (×3): 1000 mL

## 2014-10-02 MED ORDER — HYDROMORPHONE 0.3 MG/ML IV SOLN
INTRAVENOUS | Status: DC
  Administered 2014-10-02: 1.5 mg via INTRAVENOUS
  Administered 2014-10-02: 0.3 mg via INTRAVENOUS
  Administered 2014-10-03: 1.5 mg via INTRAVENOUS
  Administered 2014-10-03: 1.8 mg via INTRAVENOUS
  Administered 2014-10-03: 0.6 mg via INTRAVENOUS
  Administered 2014-10-03: 10:00:00 via INTRAVENOUS
  Administered 2014-10-03: 1.2 mg via INTRAVENOUS
  Administered 2014-10-03: 1.8 mg via INTRAVENOUS
  Administered 2014-10-04: 1.5 mg via INTRAVENOUS
  Administered 2014-10-04: 2.4 mg via INTRAVENOUS
  Administered 2014-10-04: 05:00:00 via INTRAVENOUS
  Administered 2014-10-04 (×2): 1.2 mg via INTRAVENOUS
  Administered 2014-10-04: 2.1 mg via INTRAVENOUS
  Administered 2014-10-04: 3 mg via INTRAVENOUS
  Administered 2014-10-05: 3.3 mg via INTRAVENOUS
  Administered 2014-10-05 (×2): 0.6 mg via INTRAVENOUS
  Filled 2014-10-02 (×3): qty 25

## 2014-10-02 MED ORDER — ROCURONIUM BROMIDE 100 MG/10ML IV SOLN
INTRAVENOUS | Status: DC | PRN
  Administered 2014-10-02: 30 mg via INTRAVENOUS

## 2014-10-02 MED ORDER — ONDANSETRON HCL 4 MG/2ML IJ SOLN
INTRAMUSCULAR | Status: DC | PRN
  Administered 2014-10-02 (×2): 4 mg via INTRAVENOUS

## 2014-10-02 MED ORDER — PHENYLEPHRINE HCL 10 MG/ML IJ SOLN
10.0000 mg | INTRAMUSCULAR | Status: DC | PRN
  Administered 2014-10-02: 25 ug/min via INTRAVENOUS

## 2014-10-02 MED ORDER — FENTANYL CITRATE (PF) 250 MCG/5ML IJ SOLN
INTRAMUSCULAR | Status: AC
  Filled 2014-10-02: qty 5

## 2014-10-02 MED ORDER — ONDANSETRON HCL 4 MG/2ML IJ SOLN: 4.0000 mg | Freq: Four times a day (QID) | INTRAMUSCULAR | Status: DC | PRN

## 2014-10-02 MED ORDER — SODIUM CHLORIDE 0.9 % IJ SOLN: 9.0000 mL | INTRAMUSCULAR | Status: DC | PRN

## 2014-10-02 MED ORDER — METOPROLOL TARTRATE 1 MG/ML IV SOLN
5.0000 mg | Freq: Three times a day (TID) | INTRAVENOUS | Status: DC
  Administered 2014-10-02 – 2014-10-03 (×2): 5 mg via INTRAVENOUS
  Filled 2014-10-02 (×3): qty 5

## 2014-10-02 MED ORDER — SODIUM CHLORIDE 0.9 % IV SOLN
INTRAVENOUS | Status: DC
  Administered 2014-10-02 – 2014-10-09 (×3): via INTRAVENOUS

## 2014-10-02 MED ORDER — HYDROMORPHONE HCL 1 MG/ML IJ SOLN
INTRAMUSCULAR | Status: AC
  Filled 2014-10-02: qty 1

## 2014-10-02 MED ORDER — NALOXONE HCL 0.4 MG/ML IJ SOLN: 0.4000 mg | INTRAMUSCULAR | Status: DC | PRN

## 2014-10-02 MED ORDER — DIPHENHYDRAMINE HCL 12.5 MG/5ML PO ELIX
12.5000 mg | ORAL_SOLUTION | Freq: Four times a day (QID) | ORAL | Status: DC | PRN
  Filled 2014-10-02: qty 5

## 2014-10-02 MED ORDER — SODIUM CHLORIDE 0.9 % IV BOLUS (SEPSIS)
1000.0000 mL | Freq: Once | INTRAVENOUS | Status: AC
  Administered 2014-10-02: 1000 mL via INTRAVENOUS

## 2014-10-02 MED ORDER — METHYLENE BLUE 1 % INJ SOLN
INTRAMUSCULAR | Status: AC
  Filled 2014-10-02: qty 10

## 2014-10-02 MED ORDER — ONDANSETRON HCL 4 MG/2ML IJ SOLN
INTRAMUSCULAR | Status: AC
  Filled 2014-10-02: qty 6

## 2014-10-02 MED ORDER — PHENYLEPHRINE HCL 10 MG/ML IJ SOLN
INTRAMUSCULAR | Status: DC | PRN
  Administered 2014-10-02: 40 ug via INTRAVENOUS

## 2014-10-02 MED ORDER — PHENYLEPHRINE 40 MCG/ML (10ML) SYRINGE FOR IV PUSH (FOR BLOOD PRESSURE SUPPORT)
PREFILLED_SYRINGE | INTRAVENOUS | Status: AC
  Filled 2014-10-02: qty 10

## 2014-10-02 MED ORDER — LEVOFLOXACIN IN D5W 250 MG/50ML IV SOLN
250.0000 mg | INTRAVENOUS | Status: DC
  Filled 2014-10-02 (×2): qty 50

## 2014-10-02 MED ORDER — DIPHENHYDRAMINE HCL 50 MG/ML IJ SOLN: 12.5000 mg | Freq: Four times a day (QID) | INTRAMUSCULAR | Status: DC | PRN

## 2014-10-02 MED ORDER — HYDROMORPHONE 0.3 MG/ML IV SOLN
INTRAVENOUS | Status: AC
  Administered 2014-10-02: 15:00:00
  Filled 2014-10-02: qty 25

## 2014-10-02 MED ORDER — METHYLENE BLUE 1 % INJ SOLN
INTRAMUSCULAR | Status: DC | PRN
  Administered 2014-10-02: 10 mL via INTRAVENOUS

## 2014-10-02 MED ORDER — ESMOLOL HCL 10 MG/ML IV SOLN
INTRAVENOUS | Status: DC | PRN
  Administered 2014-10-02: 40 mg via INTRAVENOUS

## 2014-10-02 MED ORDER — FENTANYL CITRATE (PF) 100 MCG/2ML IJ SOLN
INTRAMUSCULAR | Status: DC | PRN
  Administered 2014-10-02 (×2): 50 ug via INTRAVENOUS
  Administered 2014-10-02: 100 ug via INTRAVENOUS
  Administered 2014-10-02: 50 ug via INTRAVENOUS

## 2014-10-02 MED ORDER — SUCCINYLCHOLINE CHLORIDE 20 MG/ML IJ SOLN
INTRAMUSCULAR | Status: DC | PRN
  Administered 2014-10-02: 100 mg via INTRAVENOUS

## 2014-10-02 MED ORDER — ARTIFICIAL TEARS OP OINT
TOPICAL_OINTMENT | OPHTHALMIC | Status: AC
  Filled 2014-10-02: qty 3.5

## 2014-10-02 MED ORDER — DEXAMETHASONE SODIUM PHOSPHATE 4 MG/ML IJ SOLN
INTRAMUSCULAR | Status: DC | PRN
  Administered 2014-10-02: 4 mg via INTRAVENOUS

## 2014-10-02 MED ORDER — LABETALOL HCL 5 MG/ML IV SOLN
INTRAVENOUS | Status: DC | PRN
  Administered 2014-10-02: 5 mg via INTRAVENOUS

## 2014-10-02 MED ORDER — MIDAZOLAM HCL 5 MG/5ML IJ SOLN
INTRAMUSCULAR | Status: DC | PRN
  Administered 2014-10-02: 2 mg via INTRAVENOUS

## 2014-10-02 MED ORDER — LIDOCAINE HCL (CARDIAC) 20 MG/ML IV SOLN
INTRAVENOUS | Status: DC | PRN
  Administered 2014-10-02: 60 mg via INTRAVENOUS

## 2014-10-02 MED ORDER — PROPOFOL 10 MG/ML IV BOLUS
INTRAVENOUS | Status: DC | PRN
  Administered 2014-10-02: 150 mg via INTRAVENOUS

## 2014-10-02 MED ORDER — MIDAZOLAM HCL 2 MG/2ML IJ SOLN
INTRAMUSCULAR | Status: AC
  Filled 2014-10-02: qty 2

## 2014-10-02 MED ORDER — DEXAMETHASONE SODIUM PHOSPHATE 4 MG/ML IJ SOLN
INTRAMUSCULAR | Status: AC
  Filled 2014-10-02: qty 1

## 2014-10-02 MED ORDER — NEOSTIGMINE METHYLSULFATE 10 MG/10ML IV SOLN
INTRAVENOUS | Status: DC | PRN
  Administered 2014-10-02: 4 mg via INTRAVENOUS

## 2014-10-02 SURGICAL SUPPLY — 61 items
BLADE SURG ROTATE 9660 (MISCELLANEOUS) IMPLANT
BNDG GAUZE ELAST 4 BULKY (GAUZE/BANDAGES/DRESSINGS) ×1 IMPLANT
CANISTER SUCTION 2500CC (MISCELLANEOUS) ×2 IMPLANT
CHLORAPREP W/TINT 26ML (MISCELLANEOUS) ×2 IMPLANT
COVER MAYO STAND STRL (DRAPES) ×1 IMPLANT
COVER SURGICAL LIGHT HANDLE (MISCELLANEOUS) ×3 IMPLANT
DRAPE LAPAROSCOPIC ABDOMINAL (DRAPES) ×2 IMPLANT
DRAPE PROXIMA HALF (DRAPES) ×2 IMPLANT
DRAPE UTILITY XL STRL (DRAPES) ×4 IMPLANT
DRAPE WARM FLUID 44X44 (DRAPE) ×2 IMPLANT
DRSG OPSITE POSTOP 4X10 (GAUZE/BANDAGES/DRESSINGS) IMPLANT
DRSG OPSITE POSTOP 4X8 (GAUZE/BANDAGES/DRESSINGS) IMPLANT
DRSG PAD ABDOMINAL 8X10 ST (GAUZE/BANDAGES/DRESSINGS) ×1 IMPLANT
ELECT BLADE 6.5 EXT (BLADE) ×2 IMPLANT
ELECT CAUTERY BLADE 6.4 (BLADE) ×2 IMPLANT
ELECT REM PT RETURN 9FT ADLT (ELECTROSURGICAL) ×2
ELECTRODE REM PT RTRN 9FT ADLT (ELECTROSURGICAL) ×1 IMPLANT
GAUZE SPONGE 4X4 12PLY STRL (GAUZE/BANDAGES/DRESSINGS) ×1 IMPLANT
GLOVE BIO SURGEON STRL SZ7.5 (GLOVE) ×1 IMPLANT
GLOVE BIO SURGEON STRL SZ8 (GLOVE) ×2 IMPLANT
GLOVE BIOGEL PI IND STRL 6.5 (GLOVE) IMPLANT
GLOVE BIOGEL PI IND STRL 7.5 (GLOVE) IMPLANT
GLOVE BIOGEL PI IND STRL 8 (GLOVE) ×1 IMPLANT
GLOVE BIOGEL PI INDICATOR 6.5 (GLOVE) ×2
GLOVE BIOGEL PI INDICATOR 7.5 (GLOVE) ×2
GLOVE BIOGEL PI INDICATOR 8 (GLOVE) ×1
GLOVE ECLIPSE 6.5 STRL STRAW (GLOVE) ×1 IMPLANT
GLOVE SURG SS PI 6.5 STRL IVOR (GLOVE) ×1 IMPLANT
GOWN STRL REUS W/ TWL LRG LVL3 (GOWN DISPOSABLE) ×2 IMPLANT
GOWN STRL REUS W/ TWL XL LVL3 (GOWN DISPOSABLE) ×1 IMPLANT
GOWN STRL REUS W/TWL LRG LVL3 (GOWN DISPOSABLE) ×4
GOWN STRL REUS W/TWL XL LVL3 (GOWN DISPOSABLE) ×4
KIT BASIN OR (CUSTOM PROCEDURE TRAY) ×2 IMPLANT
KIT OSTOMY DRAINABLE 2.75 STR (WOUND CARE) ×1 IMPLANT
KIT ROOM TURNOVER OR (KITS) ×2 IMPLANT
LIGASURE IMPACT 36 18CM CVD LR (INSTRUMENTS) ×1 IMPLANT
NS IRRIG 1000ML POUR BTL (IV SOLUTION) ×5 IMPLANT
PACK GENERAL/GYN (CUSTOM PROCEDURE TRAY) ×2 IMPLANT
PAD ARMBOARD 7.5X6 YLW CONV (MISCELLANEOUS) ×2 IMPLANT
PENCIL BUTTON HOLSTER BLD 10FT (ELECTRODE) ×1 IMPLANT
RELOAD PROXIMATE 75MM BLUE (ENDOMECHANICALS) ×2 IMPLANT
RELOAD STAPLE 75 3.8 BLU REG (ENDOMECHANICALS) IMPLANT
SPECIMEN JAR LARGE (MISCELLANEOUS) ×1 IMPLANT
SPONGE LAP 18X18 X RAY DECT (DISPOSABLE) ×3 IMPLANT
STAPLER CUT CVD 40MM BLUE (STAPLE) ×1 IMPLANT
STAPLER CUT RELOAD BLUE (STAPLE) ×1 IMPLANT
STAPLER PROXIMATE 75MM BLUE (STAPLE) ×1 IMPLANT
STAPLER VISISTAT 35W (STAPLE) ×2 IMPLANT
SUCTION POOLE TIP (SUCTIONS) ×2 IMPLANT
SUT PDS AB 1 TP1 96 (SUTURE) ×4 IMPLANT
SUT PROLENE 2 0 CT2 30 (SUTURE) ×2 IMPLANT
SUT VIC AB 2-0 SH 18 (SUTURE) ×2 IMPLANT
SUT VIC AB 3-0 SH 18 (SUTURE) ×3 IMPLANT
SUT VICRYL AB 2 0 TIES (SUTURE) ×2 IMPLANT
SUT VICRYL AB 3 0 TIES (SUTURE) ×2 IMPLANT
TAPE CLOTH SURG 6X10 WHT LF (GAUZE/BANDAGES/DRESSINGS) ×1 IMPLANT
TOWEL OR 17X24 6PK STRL BLUE (TOWEL DISPOSABLE) ×2 IMPLANT
TOWEL OR 17X26 10 PK STRL BLUE (TOWEL DISPOSABLE) ×2 IMPLANT
TRAY FOLEY CATH 16FRSI W/METER (SET/KITS/TRAYS/PACK) ×1 IMPLANT
TUBE CONNECTING 12X1/4 (SUCTIONS) ×2 IMPLANT
YANKAUER SUCT BULB TIP NO VENT (SUCTIONS) ×2 IMPLANT

## 2014-10-02 NOTE — Anesthesia Procedure Notes (Signed)
Procedure Name: Intubation Date/Time: 10/02/2014 9:23 AM Performed by: Merrilyn Puma B Pre-anesthesia Checklist: Patient identified, Timeout performed, Emergency Drugs available, Suction available and Patient being monitored Patient Re-evaluated:Patient Re-evaluated prior to inductionOxygen Delivery Method: Circle system utilized Preoxygenation: Pre-oxygenation with 100% oxygen Intubation Type: IV induction, Rapid sequence and Cricoid Pressure applied Laryngoscope Size: Mac and 3 Grade View: Grade I Tube type: Oral Tube size: 7.0 mm Number of attempts: 1 Airway Equipment and Method: Stylet Placement Confirmation: positive ETCO2,  ETT inserted through vocal cords under direct vision,  breath sounds checked- equal and bilateral and CO2 detector Secured at: 21 cm Tube secured with: Tape Dental Injury: Teeth and Oropharynx as per pre-operative assessment

## 2014-10-02 NOTE — Op Note (Signed)
Preoperative diagnosis: Large bowel obstruction secondary to sigmoid colon stricture and colovaginal fistula  Postoperative diagnosis: Same  Procedure: Sigmoid colon colectomy with in colostomy takedown of colovaginal fistula  Surgeon: Erroll Luna M.D.  Asst.: Dr. Marlou Starks M.D.  Anesthesia: Gen. endotracheal anesthesia  EBL: 200 mL  IV fluids 2 L of crystalloid  Specimens: Sigmoid colon to pathology  Drains: None  Indications for procedure: The patient is a 64 year old female with a known benign stricture of her sigmoid colon and colovaginal fistula. She was evaluated by Dr. Marcello Moores earlier this year and had a colonoscopy which showed a occlusive stricture of her sigmoid colon. We scheduled for surgery due to multiple delays this has not happened. She was admitted 2 days ago to the medical service with severe nausea vomiting abdominal pain and signs of a large bowel obstruction. He tried to decompress her but she did not improve and therefore recommended surgical resection and colostomy as she was not able to tolerate a bowel prep.The procedure was discussed with the patient.  partial colectomy and colostomy  discussed with the patient as well as non operative treatments. The risks of operative management include bleeding,  Infection,  Leak of anastamosis,  Ostomy formation, open procedure,  Sepsis,  Abcess,  Hernia,  DVT,  Pulmonary complications,  Cardiovascular  complications,  Injury to ureter,  Bladder,kidney,and anesthesia risks,  And death. The patient understands.  Questions answered.   The success of the procedure is 50-85 % for treating the patients symptoms. They agree to proceed.  Description of procedure: Patient met in holding area and questions are answered. She was taken back to the operating room placed upon the OR table. After induction of endotracheal anesthesia, Foley catheter was placed in the abdomen was prepped and draped in a sterile fashion. She was on preoperative  antibiotics already. Midline incision was used just above the umbilicus down to the pubic symphysis. Dissection was carried to the midlungs the abdominal cavity without difficulty. There were denser the omental adhesions to the pelvis. She had evidence of bowel obstruction. Retractor was placed and she was placed in Trendelenburg and the small bowel contents were packed away with moist sponges. A begin to mobilize the descending sigmoid colon along the white line of Toldt. Massively dilated she was densely adherent to her left pelvic sidewall and left age of her old vaginal cuff. This was extremely dense very difficult to dissect around. We dissected out the ureter to identify prior to doing this. We then carefully begin to mobilize the segment of sigmoid colon all left pelvic sidewall very difficult. This took about an hour to do. Once this came away. Trace to ureter which was below this deep to the sigmoid colon. There was some blood in her urine we gave her methylene blue IV to evaluate for any injury to her genitourinary tract. No evidence of any blue dye in the pelvis and we could directly visualize ureter during his course through this area this appeared uninjured. The pelvis was irrigated with copious amounts of saline hemostasis achieved. The  ascending, transverse and descending colon were otherwise normal. The  rectal stump was tagged with 2-0 Prolene. An end colostomy was constructed by choosing a site the left lateral abdominal wall in placing a curved linear incision lateral to the umbilicus over the rectus sheath. Dissection was carried down in a cruciate incision was made in the rectus sheath. A Kary Kos was passed and the colon was pulled through this. There is ample room. All  packs removed and counted These are correct. Fascia closed with double-stranded #1 PDS. Skin was left open and packed. Colostomy matured with 20 and 3-0 Vicryl sutures. Appliance applied. All final counts found to be correct  sponge, needle and this was. Patient was awoke, extubated and  taken recovery in satisfactory condition.

## 2014-10-02 NOTE — Progress Notes (Signed)
Pt received from PACU into 3S07. Pt VSS, NG tube connected to LWS and PCA instructions given. Call bell within reach. Will continue to monitor.

## 2014-10-02 NOTE — Progress Notes (Signed)
[facility] PROGRESS NOTE  Alexa Key TMH:[id number] DOB: 08/24/1950 DOA: 09/29/2014 PCP: [name], PA-C  Assessment/Plan: #1 partial colonic obstruction secondary to benign sigmoid stricture with colovaginal fistula Patient with no significant improvement. Patient with some complaints of abdominal pain. Patient with no bowel movement. Patient stated had flatus. Patient was also have a cardiac catheterization however due to worsening renal function this was canceled. Patient is moderate risk for surgery. Patient is being followed by cardiology and risk and benefits of surgery have been explained. Patient in agreement to proceed with surgery. Patient for probable colectomy/colostomy today per general surgery.  #2 coronary artery disease with intermediate risk Myoview Patient denies any chest or no shortness of breath. Patient had an abnormal Myoview and was subsequently scheduled for cardiac catheterization which was to be done yesterday, however due to worsening renal function cardiac catheterization has been canceled. Per cardiology.  #3 acute on chronic kidney disease III ( baseline creatinine 1.2-1.5) Questionable etiology. Differential includes prerenal versus renal secondary to ATN. Renal function currently stable from yesterday with creatinine at 2.60 from 2.59. Patient with recent CT scan done with contrast. Patient also with small bowel obstruction with several episodes of emesis and nausea with poor oral intake. Patient does not look volume overloaded on examination. FENa = 0.2%. Renal ultrasound negative for hydronephrosis. Give a bolus of normal saline 1 L and increase IV fluids to 125 mL per hour. Follow.  #4 hypertension Stable. Increase IV Lopressor to 5 mg every 8 hours.  #5 COPD Stable. continue Symbicort.   #6 colovaginal fistula Per general surgery.  #7 pyuria/bacteriuria/probable UTI Patient with some complaints of dysuria and abdominal pain yesterday and  today. Repeat urinalysis worrisome for UTI with large leukocytes turbid too numerous to count WBCs. Urine cultures pending. Patient has been started on IV Levaquin..   #8 tobacco abuse Nicotine patch. Tobacco cessation.  #9 prophylaxis PPI for GI prophylaxis. Heparin for DVT prophylaxis.    Code Status: Full Family Communication: Updated patient and daughter at bedside. Disposition Plan: Home when medically stable.   Consultants:  Gen. surgery: Dr. Brantley Stage 09/30/2014  Cardiology: Dr. Wilhemina Cash 09/30/2014  Procedures:  CT abdomen and pelvis 09/29/2014  Renal ultrasound 10/01/2014  Antibiotics:  IV Levaquin 10/01/2014   HPI/Subjective: Patient denies nausea or emesis states NG tube has helped. Patient states some improvement with abdominal pain however still has lower abdominal pain. Patient with dysuria. Patient denies any shortness of breath. Patient denies any chest pain. Patient denies any bowel movement. Patient stated had an episode of flatus.  Objective: Filed Vitals:   10/02/14 0522  BP: 156/43  Pulse: 79  Temp: 98.4 F (36.9 C)  Resp: 20    Intake/Output Summary (Last 24 hours) at 10/02/14 0807 Last data filed at 10/02/14 0748  Gross per 24 hour  Intake 2188.44 ml  Output   2175 ml  Net  13.44 ml   Filed Weights   09/29/14 1900 09/30/14 0534 10/01/14 0602  Weight: 65.409 kg (144 lb 3.2 oz) 66.497 kg (146 lb 9.6 oz) 66.679 kg (147 lb)    Exam:   General:  NAD. NGT in place  Cardiovascular: RRR  Respiratory: CTAB  Abdomen: Soft, tenderness to palpation in lower quadrants, hypoactive bowel sounds,   Musculoskeletal: No clubbing cyanosis or edema.  Data Reviewed: Basic Metabolic Panel:  Recent Labs Lab 09/29/14 1107 10/01/14 0942 10/02/14 0357  NA 142 140 141  K 3.7 4.1 4.0  CL 111 105 108  CO2 21*  22 23  GLUCOSE 121* 122* 84  BUN 20 38* 48*  CREATININE 1.21* 2.59* 2.60*  CALCIUM 9.1 8.4* 7.3*   Liver Function Tests:  Recent  Labs Lab 09/29/14 1107  AST 22  ALT 18  ALKPHOS 77  BILITOT 0.3  PROT 7.2  ALBUMIN 3.7    Recent Labs Lab 09/29/14 1108  LIPASE 29   No results for input(s): AMMONIA in the last 168 hours. CBC:  Recent Labs Lab 09/29/14 1107 10/01/14 0942 10/02/14 0357  WBC 13.0* 14.5* 9.8  NEUTROABS 10.3*  --   --   HGB 12.5 12.4 10.7*  HCT 40.1 39.3 33.6*  MCV 96.4 95.6 95.7  PLT 283 315 243   Cardiac Enzymes:  Recent Labs Lab 09/29/14 1108  TROPONINI 0.03   BNP (last 3 results) No results for input(s): BNP in the last 8760 hours.  ProBNP (last 3 results) No results for input(s): PROBNP in the last 8760 hours.  CBG:  Recent Labs Lab 09/30/14 0600 10/01/14 0010 10/01/14 0404 10/01/14 2132 10/02/14 0558  GLUCAP 127* 115* 107* 90 82    Recent Results (from the past 240 hour(s))  Culture, blood (routine x 2)     Status: None (Preliminary result)   Collection Time: 09/30/14 12:32 PM  Result Value Ref Range Status   Specimen Description BLOOD LEFT HAND  Final   Special Requests BOTTLES DRAWN AEROBIC ONLY 10CC  Final   Culture   Final           BLOOD CULTURE RECEIVED NO GROWTH TO DATE CULTURE WILL BE HELD FOR 5 DAYS BEFORE ISSUING A FINAL NEGATIVE REPORT Performed at [facility]    Report Status PENDING  Incomplete  Culture, blood (routine x 2)     Status: None (Preliminary result)   Collection Time: 09/30/14 12:41 PM  Result Value Ref Range Status   Specimen Description BLOOD RIGHT HAND  Final   Special Requests BOTTLES DRAWN AEROBIC ONLY 8CC  Final   Culture   Final           BLOOD CULTURE RECEIVED NO GROWTH TO DATE CULTURE WILL BE HELD FOR 5 DAYS BEFORE ISSUING A FINAL NEGATIVE REPORT Performed at [facility]    Report Status PENDING  Incomplete  Culture, Urine     Status: None   Collection Time: 09/30/14  5:47 PM  Result Value Ref Range Status   Specimen Description URINE, RANDOM  Final   Special Requests NONE  Final   Colony Count    Final    >=100,000 COLONIES/ML Performed at [facility]    Culture   Final    Multiple bacterial morphotypes present, none predominant. Suggest appropriate recollection if clinically indicated. Performed at [facility]    Report Status 10/01/2014 FINAL  Final  Surgical pcr screen     Status: None   Collection Time: 10/01/14 10:28 PM  Result Value Ref Range Status   MRSA, PCR NEGATIVE NEGATIVE Final   Staphylococcus aureus NEGATIVE NEGATIVE Final    Comment:        The Xpert SA Assay (FDA approved for NASAL specimens in patients over 38 years of age), is one component of a comprehensive surveillance program.  Test performance has been validated by [facility] for patients greater than or equal to 66 year old. It is not intended to diagnose infection nor to guide or monitor treatment.      Studies: US Renal  10/01/2014   CLINICAL DATA:  Acute renal failure.  EXAM: RENAL / URINARY TRACT ULTRASOUND COMPLETE  COMPARISON:  CT 09/29/2014  FINDINGS: Right Kidney:  Length: 10.2 cm. Small 9 mm cyst in the upper pole. Normal echotexture. Normal echotexture no hydronephrosis.  Left Kidney:  Length: 10.1 cm. Normal echotexture. No hydronephrosis. 12 mm cyst in the upper pole. 19 mm hypoechoic parapelvic area in the lower pole, likely small parapelvic cysts. This was seen scratch head this appears to be a cyst on prior CT. Mild cortical thinning within the mid and upper pole.  Bladder:  Appears normal for degree of bladder distention.  IMPRESSION: Small bilateral renal cysts. No hydronephrosis. No acute findings. Scarring in the mid and upper pole of the left kidney.   Electronically Signed   By: Rolm Baptise M.D.   On: 10/01/2014 14:53    Scheduled Meds: . budesonide-formoterol  2 puff Inhalation BID  . clindamycin (CLEOCIN) IV  900 mg Intravenous To SS-Surg  . gentamicin  100 mg Intravenous To SS-Surg  . levofloxacin (LEVAQUIN) IV  250 mg Intravenous Q24H  . metoprolol   2.5 mg Intravenous 4 times per day  . nicotine  7 mg Transdermal Daily  . pantoprazole (PROTONIX) IV  40 mg Intravenous Q24H  . pneumococcal 23 valent vaccine  0.5 mL Intramuscular Tomorrow-1000  . promethazine  12.5 mg Intravenous 3 times per day  . sodium chloride  1,000 mL Intravenous Once  . sodium chloride  3 mL Intravenous Q12H   Continuous Infusions: . sodium chloride 100 mL/hr at 10/02/14 0242  . sodium chloride 1 mL/kg/hr (10/01/14 0719)    Principal Problem:   Colonic obstruction; Partial, secondary to benign sigmoid colonic stricture Active Problems:   Tobacco user   Rectovaginal fistula   Essential hypertension   COPD (chronic obstructive pulmonary disease)   Abdominal pain   CAD in native artery   CKD (chronic kidney disease) stage 3, GFR 30-59 ml/min   GERD (gastroesophageal reflux disease)   Bacteriuria   Abnormal nuclear stress test   Pre-operative cardiovascular examination, myocardial ischemia   ARF (acute renal failure)    Time spent: 82 minutes    Mumin Denomme M.D. [facility] Pager [phone]. If 7PM-7AM, please contact night-coverage at [email or web address], password [facility] 10/02/2014, 8:07 AM  LOS: 3 days

## 2014-10-02 NOTE — Procedures (Signed)
Extubation Procedure Note  Patient Details:   Name: Alexa Key DOB: [birth year] MRN: [id number]   Airway Documentation:     Evaluation  O2 sats: stable throughout Complications: No apparent complications Patient did tolerate procedure well. Bilateral Breath Sounds: Clear, Diminished   Yes   Pt extubated to 3L [place]. Spo2 decreased to 87 on RA. Pt with coarse/rhonchi BS bilaterally. Pt has weak, non productive cough but able to speak with no complications. RT will continue to monitor.   [name] 10/02/2014, 1:43 PM

## 2014-10-02 NOTE — Progress Notes (Signed)
Pt intubated in OR. Came to PACU bay 13 being manually ventilated by CRNA. Pt placed on ventilator in PS/CPAP. Pt waking up and restless at this time. RT will continue to monitor.

## 2014-10-02 NOTE — Progress Notes (Signed)
ANTIBIOTIC CONSULT NOTE - INITIAL  Pharmacy Consult for levaquin Indication: UTI  Allergies  Allergen Reactions  . Penicillins Rash  . Sulfa Antibiotics Rash    Patient Measurements: Height: 5\' 1"  (154.9 cm) Weight: 147 lb (66.679 kg) (b scale) IBW/kg (Calculated) : 47.8   Vital Signs: Temp: 98.4 F (36.9 C) (06/02 0522) Temp Source: Oral (06/02 0522) BP: 156/43 mmHg (06/02 0522) Pulse Rate: 79 (06/02 0522) Intake/Output from previous day: 06/01 0701 - 06/02 0700 In: 2188.4 [P.O.:10; I.V.:2078.4; IV Piggyback:100] Out: 2075 [Urine:575; Emesis/NG output:1500] Intake/Output from this shift: Total I/O In: 500 [I.V.:500] Out: 100 [Emesis/NG output:100]  Labs:  Recent Labs  09/29/14 1107 10/01/14 0942 10/01/14 1431 10/02/14 0357  WBC 13.0* 14.5*  --  9.8  HGB 12.5 12.4  --  10.7*  PLT 283 315  --  243  LABCREA  --   --  179.32  --   CREATININE 1.21* 2.59*  --  2.60*   Estimated Creatinine Clearance: 18.6 mL/min (by C-G formula based on Cr of 2.6). No results for input(s): VANCOTROUGH, VANCOPEAK, VANCORANDOM, GENTTROUGH, GENTPEAK, GENTRANDOM, TOBRATROUGH, TOBRAPEAK, TOBRARND, AMIKACINPEAK, AMIKACINTROU, AMIKACIN in the last 72 hours.   Microbiology: Recent Results (from the past 720 hour(s))  Culture, blood (routine x 2)     Status: None (Preliminary result)   Collection Time: 09/30/14 12:32 PM  Result Value Ref Range Status   Specimen Description BLOOD LEFT HAND  Final   Special Requests BOTTLES DRAWN AEROBIC ONLY 10CC  Final   Culture   Final           BLOOD CULTURE RECEIVED NO GROWTH TO DATE CULTURE WILL BE HELD FOR 5 DAYS BEFORE ISSUING A FINAL NEGATIVE REPORT Performed at [facility]    Report Status PENDING  Incomplete  Culture, blood (routine x 2)     Status: None (Preliminary result)   Collection Time: 09/30/14 12:41 PM  Result Value Ref Range Status   Specimen Description BLOOD RIGHT HAND  Final   Special Requests BOTTLES DRAWN AEROBIC ONLY  8CC  Final   Culture   Final           BLOOD CULTURE RECEIVED NO GROWTH TO DATE CULTURE WILL BE HELD FOR 5 DAYS BEFORE ISSUING A FINAL NEGATIVE REPORT Performed at [facility]    Report Status PENDING  Incomplete  Culture, Urine     Status: None   Collection Time: 09/30/14  5:47 PM  Result Value Ref Range Status   Specimen Description URINE, RANDOM  Final   Special Requests NONE  Final   Colony Count   Final    >=100,000 COLONIES/ML Performed at [facility]    Culture   Final    Multiple bacterial morphotypes present, none predominant. Suggest appropriate recollection if clinically indicated. Performed at [facility]    Report Status 10/01/2014 FINAL  Final  Surgical pcr screen     Status: None   Collection Time: 10/01/14 10:28 PM  Result Value Ref Range Status   MRSA, PCR NEGATIVE NEGATIVE Final   Staphylococcus aureus NEGATIVE NEGATIVE Final    Comment:        The Xpert SA Assay (FDA approved for NASAL specimens in patients over 74 years of age), is one component of a comprehensive surveillance program.  Test performance has been validated by [facility] for patients greater than or equal to 27 year old. It is not intended to diagnose infection nor to guide or monitor treatment.  Medical History: Past Medical History  Diagnosis Date  . CAD (coronary artery disease)     a. 07/2012 s/p MI ->not cath candidate 2/2 AKI; b. 09/2012 Neg MV;  c. 08/2014 Lexi CL: EF 45-54%, + ischemia, intermittent risk study.  Marland Kitchen PVD (peripheral vascular disease)   . CVA (cerebral infarction)     TIAx2  . HTN (hypertension)   . HLD (hyperlipidemia)   . Tobacco abuse   . Diverticulitis 08/04/12  . S/P partial lobectomy of lung 1997    per [facility] incidental finding, path c/w benign lesion  . Stroke   . COPD (chronic obstructive pulmonary disease)   . Fibromyalgia   . PONV (postoperative nausea and vomiting)   . Anxiety   . Diabetes mellitus without  complication     on no meds   . GERD (gastroesophageal reflux disease)   . H/O echocardiogram     a. 07/2012 Echo: EF 50-55%, grade 2 DD, mild LVH.   Assessment: 64 yo F with SBO due to sigmoid stricture with colovaginal fistula.  Pharmacy consulted to dose lvq for UTI.  WT 66.7 kg, WBC 14.5>9.8. Creat 2.60; AF.  No foley catheter. 5/31 Ucx > 100 K colonies of multiple bacteria, none predominant Final.   Plan:  -levaquin 500 mg IV x 1 given 6/1 at [place] -levaquin 250 qhs x 3 doses for uncomplicated UTI  Pharmacy to sign off Thanks  Eudelia Bunch, Pharm.D. [phone] 10/02/2014 10:01 AM

## 2014-10-02 NOTE — Anesthesia Preprocedure Evaluation (Addendum)
Anesthesia Evaluation  Patient identified by MRN, date of birth, ID band Patient awake    Reviewed: Allergy & Precautions, NPO status , Patient's Chart, lab work & pertinent test results  History of Anesthesia Complications (+) PONV  Airway Mallampati: II  TM Distance: >3 FB Neck ROM: Full    Dental  (+) Dental Advisory Given   Pulmonary COPD COPD inhaler, Current Smoker,  S/P partial lobectomy 1997 breath sounds clear to auscultation        Cardiovascular hypertension, Pt. on medications and Pt. on home beta blockers + CAD, + Past MI and + Peripheral Vascular Disease Rhythm:Regular Rate:Normal  Cardiology note from Dr. Martinique - CAD with intermediate risk Myoview. Patient is asymptomatic from a cardiac standpoint. She was originally scheduled for outpatient cath but developed protracted N/V and was admitted. She has a rectovaginal fistula and partial colectomy planned. Per Dr. Allison Quarry note yesterday Cardiac cath planned today to further assess risk but this would be unlikely to change therapy. Today she has significant decline in renal function with creatinine 1.2>>2.59 in 2 days. Cardiac cath cancelled. At this point I do not see an advantage to doing a cardiac cath in the acute setting. I think her cardiac risk is moderate based on noninvasive evaluation and since she is asymptomatic I would recommend proceeding with surgery if needed. Cardiac cath would likely only increase the risk of renal failure and bad outcomes. Perhaps in the future when she has recovered from surgery and renal function is stable cardiac cath could be considered to assess her long term risk.  EF 45-54%   Neuro/Psych Anxiety CVA    GI/Hepatic GERD-  Medicated,  Endo/Other  diabetes  Renal/GU ARFRenal disease     Musculoskeletal  (+) Fibromyalgia -  Abdominal   Peds  Hematology  (+) anemia , H/H 10.7/33.6   Anesthesia Other Findings   Reproductive/Obstetrics                           Anesthesia Physical Anesthesia Plan  ASA: IV  Anesthesia Plan: General   Post-op Pain Management:    Induction: Intravenous  Airway Management Planned: Oral ETT  Additional Equipment:   Intra-op Plan:   Post-operative Plan: Extubation in OR  Informed Consent: I have reviewed the patients History and Physical, chart, labs and discussed the procedure including the risks, benefits and alternatives for the proposed anesthesia with the patient or authorized representative who has indicated his/her understanding and acceptance.   Dental advisory given  Plan Discussed with: Anesthesiologist and Surgeon  Anesthesia Plan Comments:         Anesthesia Quick Evaluation

## 2014-10-02 NOTE — Progress Notes (Signed)
Report given to nurse Sharyn Lull in [place] Stay, Ticket to Ride completed and printed for transporter and patient is being transported to the OR.  Ruben Im RN

## 2014-10-02 NOTE — Transfer of Care (Signed)
Immediate Anesthesia Transfer of Care Note  Patient: Alexa Key  Procedure(s) Performed: Procedure(s): EXPLORATORY LAPAROTOMY WITH PARTIAL COLECTOMY/COLOSTOMY (N/A)  Patient Location: PACU  Anesthesia Type:General  Level of Consciousness: Patient remains intubated per anesthesia plan  Airway & Oxygen Therapy: Patient remains intubated per anesthesia plan and Patient placed on Ventilator (see vital sign flow sheet for setting)  Post-op Assessment: Report given to RN, Post -op Vital signs reviewed and stable and Patient moving all extremities  Post vital signs: Reviewed and stable  Last Vitals:  Filed Vitals:   10/02/14 0522  BP: 156/43  Pulse: 79  Temp: 36.9 C  Resp: 20    Complications: No apparent anesthesia complications

## 2014-10-02 NOTE — Anesthesia Postprocedure Evaluation (Signed)
  Anesthesia Post-op Note  Patient: Alexa Key  Procedure(s) Performed: Procedure(s): EXPLORATORY LAPAROTOMY WITH PARTIAL COLECTOMY/COLOSTOMY (N/A)  Patient Location: PACU  Anesthesia Type: General   Level of Consciousness: awake, alert  and oriented  Airway and Oxygen Therapy: Patient Spontanous Breathing  Post-op Pain: mild  Post-op Assessment: Post-op Vital signs reviewed  Post-op Vital Signs: Reviewed  Last Vitals:  Filed Vitals:   10/02/14 1625  BP: 117/47  Pulse: 90  Temp: 36.9 C  Resp: 25    Complications: No apparent anesthesia complications

## 2014-10-03 ENCOUNTER — Inpatient Hospital Stay (HOSPITAL_COMMUNITY): Payer: Commercial Managed Care - HMO

## 2014-10-03 ENCOUNTER — Ambulatory Visit (HOSPITAL_COMMUNITY)
Admission: RE | Admit: 2014-10-03 | Payer: Commercial Managed Care - HMO | Source: Ambulatory Visit | Admitting: Cardiology

## 2014-10-03 ENCOUNTER — Encounter (HOSPITAL_COMMUNITY): Payer: Self-pay | Admitting: Surgery

## 2014-10-03 DIAGNOSIS — B9620 Unspecified Escherichia coli [E. coli] as the cause of diseases classified elsewhere: Secondary | ICD-10-CM | POA: Insufficient documentation

## 2014-10-03 DIAGNOSIS — D72829 Elevated white blood cell count, unspecified: Secondary | ICD-10-CM | POA: Insufficient documentation

## 2014-10-03 DIAGNOSIS — N390 Urinary tract infection, site not specified: Secondary | ICD-10-CM

## 2014-10-03 LAB — CBC WITH DIFFERENTIAL/PLATELET
BASOS PCT: 0 % (ref 0–1)
Basophils Absolute: 0 10*3/uL (ref 0.0–0.1)
EOS PCT: 0 % (ref 0–5)
Eosinophils Absolute: 0 10*3/uL (ref 0.0–0.7)
HCT: 29.2 % — ABNORMAL LOW (ref 36.0–46.0)
Hemoglobin: 9.2 g/dL — ABNORMAL LOW (ref 12.0–15.0)
Lymphocytes Relative: 7 % — ABNORMAL LOW (ref 12–46)
Lymphs Abs: 1 10*3/uL (ref 0.7–4.0)
MCH: 31.1 pg (ref 26.0–34.0)
MCHC: 31.5 g/dL (ref 30.0–36.0)
MCV: 98.6 fL (ref 78.0–100.0)
Monocytes Absolute: 1.3 10*3/uL — ABNORMAL HIGH (ref 0.1–1.0)
Monocytes Relative: 8 % (ref 3–12)
NEUTROS ABS: 12.8 10*3/uL — AB (ref 1.7–7.7)
Neutrophils Relative %: 85 % — ABNORMAL HIGH (ref 43–77)
Platelets: 245 10*3/uL (ref 150–400)
RBC: 2.96 MIL/uL — ABNORMAL LOW (ref 3.87–5.11)
RDW: 15.6 % — AB (ref 11.5–15.5)
WBC: 15.1 10*3/uL — AB (ref 4.0–10.5)

## 2014-10-03 LAB — RENAL FUNCTION PANEL
Albumin: 2.2 g/dL — ABNORMAL LOW (ref 3.5–5.0)
Anion gap: 10 (ref 5–15)
BUN: 46 mg/dL — ABNORMAL HIGH (ref 6–20)
CO2: 18 mmol/L — ABNORMAL LOW (ref 22–32)
CREATININE: 2.36 mg/dL — AB (ref 0.44–1.00)
Calcium: 6.7 mg/dL — ABNORMAL LOW (ref 8.9–10.3)
Chloride: 117 mmol/L — ABNORMAL HIGH (ref 101–111)
GFR calc Af Amer: 24 mL/min — ABNORMAL LOW (ref >60)
GFR, EST NON AFRICAN AMERICAN: 20 mL/min — AB (ref >60)
GLUCOSE: 103 mg/dL — AB (ref 65–99)
Phosphorus: 5.4 mg/dL — ABNORMAL HIGH (ref 2.5–4.6)
Potassium: 4.1 mmol/L (ref 3.5–5.1)
SODIUM: 145 mmol/L (ref 135–145)

## 2014-10-03 LAB — URINE CULTURE: Colony Count: >=100000

## 2014-10-03 LAB — MAGNESIUM: MAGNESIUM: 1.8 mg/dL (ref 1.7–2.4)

## 2014-10-03 MED ORDER — SODIUM BICARBONATE 650 MG PO TABS
650.0000 mg | ORAL_TABLET | Freq: Two times a day (BID) | ORAL | Status: DC
  Filled 2014-10-03 (×2): qty 1

## 2014-10-03 MED ORDER — ENOXAPARIN SODIUM 40 MG/0.4ML ~~LOC~~ SOLN
40.0000 mg | SUBCUTANEOUS | Status: DC
  Filled 2014-10-03: qty 0.4

## 2014-10-03 MED ORDER — METOPROLOL TARTRATE 1 MG/ML IV SOLN
2.5000 mg | Freq: Three times a day (TID) | INTRAVENOUS | Status: DC
  Administered 2014-10-03 – 2014-10-06 (×9): 2.5 mg via INTRAVENOUS
  Filled 2014-10-03 (×9): qty 5

## 2014-10-03 MED ORDER — ENOXAPARIN SODIUM 30 MG/0.3ML ~~LOC~~ SOLN
30.0000 mg | SUBCUTANEOUS | Status: DC
  Administered 2014-10-03 – 2014-10-06 (×4): 30 mg via SUBCUTANEOUS
  Filled 2014-10-03 (×4): qty 0.3

## 2014-10-03 MED ORDER — SODIUM BICARBONATE 8.4 % IV SOLN
INTRAVENOUS | Status: DC
  Administered 2014-10-03: 12:00:00 via INTRAVENOUS
  Filled 2014-10-03 (×3): qty 1000

## 2014-10-03 MED ORDER — CEFTRIAXONE SODIUM IN DEXTROSE 20 MG/ML IV SOLN
1.0000 g | INTRAVENOUS | Status: DC
  Administered 2014-10-03 – 2014-10-06 (×4): 1 g via INTRAVENOUS
  Filled 2014-10-03 (×5): qty 50

## 2014-10-03 NOTE — Consult Note (Addendum)
WOC ostomy consult note CCS following for assessment and plan of care to abd wound. Stoma type/location: Consult requested for colostomy; surgery was performed yesterday on 6/2. Pt is currently in ICU and has NG and is in pain. Stomal assessment/size: Stoma dark red and appears to be flush with skin level when visualized through pouch, which is intact with good seal. Output: Small amt liquid brown stool in pouch, no flatus. Ostomy pouching: 2pc.  Education provided: Demonstrated pouch appearance and briefly discussed pouching routines and demonstrated emptying.  [place] team plans to follow-up next week for further educational sessions when stable and out of ICU.  Educational materials left at bedside and supplies ordered to room for staff nurse use. Enrolled patient in [place] program: No Julien Girt MSN, [place], [place], [place], [place]

## 2014-10-03 NOTE — Progress Notes (Signed)
1 Day Post-Op  Subjective: Doing ok no SOB  Objective: Vital signs in last 24 hours: Temp:  [97.5 F (36.4 C)-98.4 F (36.9 C)] 98 F (36.7 C) (06/03 0402) Pulse Rate:  [75-94] 85 (06/03 0402) Resp:  [14-32] 17 (06/03 0422) BP: (92-130)/(34-86) 123/46 mmHg (06/03 0402) SpO2:  [92 %-99 %] 95 % (06/03 0749) FiO2 (%):  [40 %] 40 % (06/02 1315) Last BM Date: 10/02/14  Intake/Output from previous day: 06/02 0701 - 06/03 0700 In: 3374.8 [I.V.:3274.8] Out: 940 [Urine:540; Emesis/NG output:100; Blood:300] Intake/Output this shift:    Incision/Wound:WOUND OPEN AND CLEAN  OSTOMY DARK BUT VIABLE    Lab Results:   Recent Labs  10/02/14 0357 10/03/14 0309  WBC 9.8 15.1*  HGB 10.7* 9.2*  HCT 33.6* 29.2*  PLT 243 245   BMET  Recent Labs  10/02/14 0357 10/03/14 0309  NA 141 145  K 4.0 4.1  CL 108 117*  CO2 23 18*  GLUCOSE 84 103*  BUN 48* 46*  CREATININE 2.60* 2.36*  CALCIUM 7.3* 6.7*   PT/INR  Recent Labs  09/30/14 2300  LABPROT 14.6  INR 1.12   ABG No results for input(s): PHART, HCO3 in the last 72 hours.  Invalid input(s): PCO2, PO2  Studies/Results: US Renal  10/01/2014   CLINICAL DATA:  Acute renal failure.  EXAM: RENAL / URINARY TRACT ULTRASOUND COMPLETE  COMPARISON:  CT 09/29/2014  FINDINGS: Right Kidney:  Length: 10.2 cm. Small 9 mm cyst in the upper pole. Normal echotexture. Normal echotexture no hydronephrosis.  Left Kidney:  Length: 10.1 cm. Normal echotexture. No hydronephrosis. 12 mm cyst in the upper pole. 19 mm hypoechoic parapelvic area in the lower pole, likely small parapelvic cysts. This was seen scratch head this appears to be a cyst on prior CT. Mild cortical thinning within the mid and upper pole.  Bladder:  Appears normal for degree of bladder distention.  IMPRESSION: Small bilateral renal cysts. No hydronephrosis. No acute findings. Scarring in the mid and upper pole of the left kidney.   Electronically Signed   By: Rolm Baptise M.D.   On:  10/01/2014 14:53    Anti-infectives: Anti-infectives    Start     Dose/Rate Route Frequency Ordered Stop   10/02/14 2200  Levofloxacin (LEVAQUIN) IVPB 250 mg  Status:  Discontinued     250 mg 50 mL/hr over 60 Minutes Intravenous Every 24 hours 10/01/14 2105 10/02/14 2006   10/02/14 2200  Levofloxacin (LEVAQUIN) IVPB 250 mg     250 mg 50 mL/hr over 60 Minutes Intravenous Every 24 hours 10/02/14 1000 10/05/14 2159   10/02/14 0800  clindamycin (CLEOCIN) IVPB 900 mg     900 mg 100 mL/hr over 30 Minutes Intravenous To [facility] 10/01/14 1238 10/02/14 0933   10/02/14 0800  gentamicin (GARAMYCIN) IVPB 100 mg     100 mg 200 mL/hr over 30 Minutes Intravenous To [facility] 10/01/14 1238 10/02/14 0938   10/01/14 2200  levofloxacin (LEVAQUIN) IVPB 500 mg     500 mg 100 mL/hr over 60 Minutes Intravenous  Once 10/01/14 2059 10/02/14 0104      Assessment/Plan: s/p Procedure(s): EXPLORATORY LAPAROTOMY WITH PARTIAL COLECTOMY/COLOSTOMY (N/A) OOB DVT prophylaxis Ice chips  Wet to dry dressing changes  LOS: 4 days    Alexa Key A. 10/03/2014

## 2014-10-03 NOTE — Progress Notes (Signed)
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 Brantley Stage 10/02/2014. Patient with significant abdominal pain around incision site. Colostomy with some dark fluid. Patient was also to have a cardiac catheterization however due to worsening renal function this was canceled. Patient is moderate risk for surgery. Per general surgery.  #2 coronary artery disease with intermediate risk Myoview Patient denies any chest or no shortness of breath. Patient had an abnormal Myoview and was subsequently scheduled for cardiac catheterization which was to be done yesterday, however due to worsening renal function cardiac catheterization has been canceled. Patient will likely need to follow-up with cardiology as outpatient.   #3 acute on chronic kidney disease III ( baseline creatinine 1.2-1.5) Questionable etiology. Differential includes prerenal versus renal secondary to ATN. Renal function slowly trending down currently at 2.36 from 2.60 from 2.59. Patient with recent CT scan done with contrast. Patient also with small bowel obstruction with several episodes of emesis and nausea with poor oral intake. Patient with some bibasilar crackles. FENa = 0.2%. Renal ultrasound negative for hydronephrosis. Decrease IV fluids to 75 mL per hour. Follow.  #4 hypertension Stable. Decrease IV Lopressor to 2.5 mg every 8 hours.  #5 COPD Stable. continue Symbicort.   #6 colovaginal fistula Patient status post sigmoid colon colectomy with end colostomy takedown of colovaginal fistula 10/02/2014 per Dr. Brantley Stage. Per general surgery.  #7 Ecoli UTI Patient states improvement with dysuria. Urine cultures with greater than 100,000 Escherichia coli  sensitivities pending. Continue IV Levaquin.   #8 abnormal chest x-ray Chest x-ray with concerns for possible pneumonia. Patient afebrile. Patient does have a leukocytosis. Patient with no respiratory symptoms. Patient denies any shortness of breath. Patient denies any cough. Patient on IV Levaquin. Follow.  #9 leukocytosis Likely secondary to problem #7 and reactive secondary to recent surgery. Patient has been pancultures and cultures are pending. Urine cultures with Escherichia coli with sensitivities pending. Blood cultures with no growth to date. Patient currently on IV Levaquin. Follow for now.  #10 tobacco abuse Nicotine patch. Tobacco cessation.  #11 prophylaxis PPI for GI prophylaxis. Lovenox for DVT prophylaxis.    Code Status: Full Family Communication: Updated patient and daughter at bedside. Disposition Plan: Remain in the stepdown unit. Hopefully transfer to regular floor tomorrow.   Consultants:  Gen. surgery: Dr. Brantley Stage 09/30/2014  Cardiology: Dr. Wilhemina Cash 09/30/2014  Procedures:  CT abdomen and pelvis 09/29/2014  Renal ultrasound 10/01/2014  Chest x-ray 10/03/2014 Sigmoid colon colectomy with in colostomy takedown of colovaginal fistula per Dr. Brantley Stage 10/02/2014  Antibiotics:  IV Levaquin 10/01/2014   HPI/Subjective: Patient complaining of significant abdominal pain. Patient denies any shortness of breath. Patient denies any chest pain. Patient denies any cough. Patient denies any nausea or emesis. Patient states improvement with dysuria.  Objective: Filed Vitals:   10/03/14 0800  BP:   Pulse:   Temp:   Resp: 33    Intake/Output Summary (Last 24 hours) at 10/03/14 0952 Last data filed at 10/03/14 0752  Gross per 24 hour  Intake 3099.75 ml  Output    840 ml  Net 2259.75 ml   Filed Weights   09/29/14 1900 09/30/14 0534 10/01/14 0602  Weight: 65.409 kg (144 lb 3.2 oz) 66.497 kg (146 lb 9.6 oz) 66.679 kg (147 lb)    Exam:  General:  NAD.  NGT in place  Cardiovascular: RRR  Respiratory: Bibasilar crackles R> L.  Abdomen: Soft, exquisite tenderness to palpation in mid abdomen and around incision site. hypoactive bowel sounds, Colostomy with dark liquid output.  Musculoskeletal: No clubbing cyanosis or edema.  Data Reviewed: Basic Metabolic Panel:  Recent Labs Lab 09/29/14 1107 10/01/14 0942 10/02/14 0357 10/03/14 0309  NA 142 140 141 145  K 3.7 4.1 4.0 4.1  CL 111 105 108 117*  CO2 21* 22 23 18*  GLUCOSE 121* 122* 84 103*  BUN 20 38* 48* 46*  CREATININE 1.21* 2.59* 2.60* 2.36*  CALCIUM 9.1 8.4* 7.3* 6.7*  MG  --   --   --  1.8  PHOS  --   --   --  5.4*   Liver Function Tests:  Recent Labs Lab 09/29/14 1107 10/03/14 0309  AST 22  --   ALT 18  --   ALKPHOS 77  --   BILITOT 0.3  --   PROT 7.2  --   ALBUMIN 3.7 2.2*    Recent Labs Lab 09/29/14 1108  LIPASE 29   No results for input(s): AMMONIA in the last 168 hours. CBC:  Recent Labs Lab 09/29/14 1107 10/01/14 0942 10/02/14 0357 10/03/14 0309  WBC 13.0* 14.5* 9.8 15.1*  NEUTROABS 10.3*  --   --  12.8*  HGB 12.5 12.4 10.7* 9.2*  HCT 40.1 39.3 33.6* 29.2*  MCV 96.4 95.6 95.7 98.6  PLT 283 315 243 245   Cardiac Enzymes:  Recent Labs Lab 09/29/14 1108  TROPONINI 0.03   BNP (last 3 results) No results for input(s): BNP in the last 8760 hours.  ProBNP (last 3 results) No results for input(s): PROBNP in the last 8760 hours.  CBG:  Recent Labs Lab 10/01/14 0404 10/01/14 2132 10/02/14 0558 10/02/14 0817 10/02/14 1315  GLUCAP 107* 90 82 74 159*    Recent Results (from the past 240 hour(s))  Culture, blood (routine x 2)     Status: None (Preliminary result)   Collection Time: 09/30/14 12:32 PM  Result Value Ref Range Status   Specimen Description BLOOD LEFT HAND  Final   Special Requests BOTTLES DRAWN AEROBIC ONLY 10CC  Final   Culture   Final           BLOOD CULTURE RECEIVED NO GROWTH TO DATE CULTURE WILL BE HELD FOR 5  DAYS BEFORE ISSUING A FINAL NEGATIVE REPORT Performed at [facility]    Report Status PENDING  Incomplete  Culture, blood (routine x 2)     Status: None (Preliminary result)   Collection Time: 09/30/14 12:41 PM  Result Value Ref Range Status   Specimen Description BLOOD RIGHT HAND  Final   Special Requests BOTTLES DRAWN AEROBIC ONLY 8CC  Final   Culture   Final           BLOOD CULTURE RECEIVED NO GROWTH TO DATE CULTURE WILL BE HELD FOR 5 DAYS BEFORE ISSUING A FINAL NEGATIVE REPORT Performed at [facility]    Report Status PENDING  Incomplete  Culture, Urine     Status: None   Collection Time: 09/30/14  5:47 PM  Result Value Ref Range Status   Specimen Description URINE, RANDOM  Final   Special Requests NONE  Final   Colony Count   Final    >=100,000 COLONIES/ML Performed at [facility]    Culture   Final    Multiple bacterial morphotypes present, none predominant. Suggest  appropriate recollection if clinically indicated. Performed at [facility]    Report Status 10/01/2014 FINAL  Final  Culture, Urine     Status: None (Preliminary result)   Collection Time: 10/01/14  2:31 PM  Result Value Ref Range Status   Specimen Description URINE, RANDOM  Final   Special Requests NONE  Final   Colony Count   Final    >=100,000 COLONIES/ML Performed at [facility]    Culture   Final    ESCHERICHIA COLI Performed at [facility]    Report Status PENDING  Incomplete  Surgical pcr screen     Status: None   Collection Time: 10/01/14 10:28 PM  Result Value Ref Range Status   MRSA, PCR NEGATIVE NEGATIVE Final   Staphylococcus aureus NEGATIVE NEGATIVE Final    Comment:        The Xpert SA Assay (FDA approved for NASAL specimens in patients over 53 years of age), is one component of a comprehensive surveillance program.  Test performance has been validated by [facility] for patients greater than or equal to 78 year old. It is not  intended to diagnose infection nor to guide or monitor treatment.      Studies: US Renal  10/01/2014   CLINICAL DATA:  Acute renal failure.  EXAM: RENAL / URINARY TRACT ULTRASOUND COMPLETE  COMPARISON:  CT 09/29/2014  FINDINGS: Right Kidney:  Length: 10.2 cm. Small 9 mm cyst in the upper pole. Normal echotexture. Normal echotexture no hydronephrosis.  Left Kidney:  Length: 10.1 cm. Normal echotexture. No hydronephrosis. 12 mm cyst in the upper pole. 19 mm hypoechoic parapelvic area in the lower pole, likely small parapelvic cysts. This was seen scratch head this appears to be a cyst on prior CT. Mild cortical thinning within the mid and upper pole.  Bladder:  Appears normal for degree of bladder distention.  IMPRESSION: Small bilateral renal cysts. No hydronephrosis. No acute findings. Scarring in the mid and upper pole of the left kidney.   Electronically Signed   By: Rolm Baptise M.D.   On: 10/01/2014 14:53   Dg Chest Port 1 View  10/03/2014   CLINICAL DATA:  64 year old female with a history of leukocytosis.  EXAM: PORTABLE CHEST - 1 VIEW  COMPARISON:  Chest x-ray 12/20/2012, 08/09/2012. Prior abdominal CT 09/29/2014 out prior chest CT 05/19/2005  FINDINGS: Cardiac diameter unchanged. Right rotation of the patient accentuates the right heart border, with increased opacity along the right-sided mediastinum. Surgical changes in the right hilum.  No pneumothorax.  Elevation of the right hemidiaphragm. Right base not well visualized.  Interstitial opacities of the bilateral lungs.  Gastric tube projects over the mediastinum, terminating out of the field of view.  IMPRESSION: Limited chest x-ray demonstrates bilateral interstitial opacities and right basilar airspace disease, potentially multifocal infection including atypical infections as well as aspiration pneumonitis/pneumonia.  Surgical changes of the right hilar region, status post lobectomy. If the patient has a history of lung carcinoma, further  evaluation with chest CT may be useful, as the lung changes are new from the most recent chest x-ray of 12/20/2012.  Gastric tube projects over the mediastinum.  Signed,  Dulcy Fanny. Earleen Newport, DO  [facility]  [facility]   Electronically Signed   By: Corrie Mckusick D.O.   On: 10/03/2014 09:12    Scheduled Meds: . budesonide-formoterol  2 puff Inhalation BID  . enoxaparin (LOVENOX) injection  40 mg Subcutaneous Q24H  . HYDROmorphone  PCA 0.3 mg/mL   Intravenous 6 times per day  . levofloxacin (LEVAQUIN) IV  250 mg Intravenous Q24H  . metoprolol  5 mg Intravenous 3 times per day  . nicotine  7 mg Transdermal Daily  . pantoprazole (PROTONIX) IV  40 mg Intravenous Q24H  . pneumococcal 23 valent vaccine  0.5 mL Intramuscular Tomorrow-1000  . sodium bicarbonate  650 mg Oral BID   Continuous Infusions: . sodium chloride 125 mL/hr at 10/03/14 0425  . sodium chloride 10 mL/hr at 10/02/14 0071    Principal Problem:   Colonic obstruction; Partial, secondary to benign sigmoid colonic stricture Active Problems:   Tobacco user   Rectovaginal fistula   Essential hypertension   COPD (chronic obstructive pulmonary disease)   Abdominal pain   CAD in native artery   CKD (chronic kidney disease) stage 3, GFR 30-59 ml/min   GERD (gastroesophageal reflux disease)   Bacteriuria   Abnormal nuclear stress test   Pre-operative cardiovascular examination, myocardial ischemia   ARF (acute renal failure)   Acute cystitis without hematuria    Time spent: 19 minutes    THOMPSON,DANIEL M.D. [facility] Pager [phone]. If 7PM-7AM, please contact night-coverage at [email or web address], password [facility] 10/03/2014, 9:52 AM  LOS: 4 days

## 2014-10-04 LAB — BASIC METABOLIC PANEL
ANION GAP: 10 (ref 5–15)
BUN: 46 mg/dL — ABNORMAL HIGH (ref 6–20)
CO2: 25 mmol/L (ref 22–32)
CREATININE: 2.13 mg/dL — AB (ref 0.44–1.00)
Calcium: 7.5 mg/dL — ABNORMAL LOW (ref 8.9–10.3)
Chloride: 114 mmol/L — ABNORMAL HIGH (ref 101–111)
GFR calc Af Amer: 27 mL/min — ABNORMAL LOW (ref >60)
GFR, EST NON AFRICAN AMERICAN: 23 mL/min — AB (ref >60)
GLUCOSE: 127 mg/dL — AB (ref 65–99)
Potassium: 4 mmol/L (ref 3.5–5.1)
Sodium: 149 mmol/L — ABNORMAL HIGH (ref 135–145)

## 2014-10-04 LAB — CBC WITH DIFFERENTIAL/PLATELET
BASOS ABS: 0 10*3/uL (ref 0.0–0.1)
BASOS PCT: 0 % (ref 0–1)
Eosinophils Absolute: 0 10*3/uL (ref 0.0–0.7)
Eosinophils Relative: 0 % (ref 0–5)
HCT: 28.7 % — ABNORMAL LOW (ref 36.0–46.0)
Hemoglobin: 8.9 g/dL — ABNORMAL LOW (ref 12.0–15.0)
LYMPHS ABS: 1.2 10*3/uL (ref 0.7–4.0)
LYMPHS PCT: 8 % — AB (ref 12–46)
MCH: 30.5 pg (ref 26.0–34.0)
MCHC: 31 g/dL (ref 30.0–36.0)
MCV: 98.3 fL (ref 78.0–100.0)
Monocytes Absolute: 1.3 10*3/uL — ABNORMAL HIGH (ref 0.1–1.0)
Monocytes Relative: 9 % (ref 3–12)
NEUTROS ABS: 12.1 10*3/uL — AB (ref 1.7–7.7)
Neutrophils Relative %: 83 % — ABNORMAL HIGH (ref 43–77)
Platelets: 222 10*3/uL (ref 150–400)
RBC: 2.92 MIL/uL — ABNORMAL LOW (ref 3.87–5.11)
RDW: 16 % — ABNORMAL HIGH (ref 11.5–15.5)
WBC: 14.7 10*3/uL — AB (ref 4.0–10.5)

## 2014-10-04 LAB — MAGNESIUM: Magnesium: 2.2 mg/dL (ref 1.7–2.4)

## 2014-10-04 MED ORDER — DEXTROSE 5 % IV SOLN
INTRAVENOUS | Status: DC
Start: 2014-10-04 — End: 2014-10-06
  Administered 2014-10-04: 08:00:00 via INTRAVENOUS
  Filled 2014-10-04: qty 1000

## 2014-10-04 NOTE — Progress Notes (Signed)
UR COMPLETED  

## 2014-10-04 NOTE — Progress Notes (Signed)
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 Brantley Stage 10/02/2014. Patient with significant abdominal pain around incision site. Colostomy with some dark fluid. Patient was also to have a cardiac catheterization however due to worsening renal function this was canceled. Per general surgery.  #2 coronary artery disease with intermediate risk Myoview Patient denies any chest or no shortness of breath. Patient had an abnormal Myoview and was subsequently scheduled for cardiac catheterization which was to be done, however due to worsening renal function cardiac catheterization has been canceled. Patient will likely need to follow-up with cardiology as outpatient.   #3 acute on chronic kidney disease III ( baseline creatinine 1.2-1.5) Questionable etiology. Differential includes prerenal versus renal secondary to ATN. Renal function slowly trending down currently at 2.13 from 2.36 from 2.60 from 2.59. Patient with recent CT scan done with contrast. Patient also with small bowel obstruction with several episodes of emesis and nausea with poor oral intake. Patient with some bibasilar crackles. FENa = 0.2%. Renal ultrasound negative for hydronephrosis. Decrease IV fluids to 75 mL per hour. Follow.  #4 hypertension Stable. Patient was initially placed on IV Lopressor for better blood pressure control. As patient's condition improved and was tolerating a diet she was subsequently transitioned to oral Coreg.   #5 COPD Stable. Continued on home regimen of Symbicort.   #6 colovaginal fistula Patient status post sigmoid colon colectomy with end colostomy takedown of colovaginal fistula 10/02/2014 per Dr. Brantley Stage. Per general  surgery.  #7 Ecoli UTI Patient states improvement with dysuria. Urine cultures with greater than 100,000 Escherichia coli. IV Levaquin was transitioned to IV Rocephin.   #8 abnormal chest x-ray Chest x-ray with concerns for possible pneumonia. Patient afebrile. Patient does have a leukocytosis. Patient with no respiratory symptoms. Patient denies any shortness of breath. Patient denies any cough. Patient on IV Rocephin. Follow.  #9 leukocytosis Likely secondary to problem #7 and reactive secondary to recent surgery. Patient has been pancultures and cultures are pending. Urine cultures with Escherichia coli. Blood cultures with no growth to date. Patient currently on IV Rocephin. Follow for now.  #10 tobacco abuse Nicotine patch. Tobacco cessation.  #11 prophylaxis PPI for GI prophylaxis. Lovenox for DVT prophylaxis.    Code Status: Full Family Communication: Updated patient and daughter at bedside. Disposition Plan: Remain in the stepdown unit. Hopefully transfer to regular floor tomorrow.   Consultants:  Gen. surgery: Dr. Brantley Stage 09/30/2014  Cardiology: Dr. Wilhemina Cash 09/30/2014  Procedures:  CT abdomen and pelvis 09/29/2014  Renal ultrasound 10/01/2014  Chest x-ray 10/03/2014 Sigmoid colon colectomy with in colostomy takedown of colovaginal fistula per Dr. Brantley Stage 10/02/2014  Antibiotics:  IV Levaquin 10/01/2014 >>> 10/03/2014  IV Rocephin 10/03/2014  HPI/Subjective: Patient states some improvement with abdominal pain. Patient denies any shortness of breath. Patient denies any chest pain. Patient states dysuria has improved.   Objective: Filed Vitals:   10/04/14 0752  BP: 157/70  Pulse: 88  Temp: 99 F (37.2 C)  Resp: 15    Intake/Output Summary (Last 24 hours) at 10/04/14 0951 Last data filed at 10/04/14 0753  Gross per 24 hour  Intake 639.58 ml  Output   1250 ml  Net -610.42 ml   Filed Weights   09/29/14 1900 09/30/14 0534 10/01/14 0602  Weight:  65.409 kg (  144 lb 3.2 oz) 66.497 kg (146 lb 9.6 oz) 66.679 kg (147 lb)    Exam:   General:  NAD. NGT in place  Cardiovascular: RRR  Respiratory: CTAB  Abdomen: Soft, decreasing tenderness to palpation in mid abdomen and around incision site. hypoactive bowel sounds, Colostomy with dark liquid output.  Musculoskeletal: No clubbing cyanosis or edema.  Data Reviewed: Basic Metabolic Panel:  Recent Labs Lab 09/29/14 1107 10/01/14 0942 10/02/14 0357 10/03/14 0309 10/04/14 0325  NA 142 140 141 145 149*  K 3.7 4.1 4.0 4.1 4.0  CL 111 105 108 117* 114*  CO2 21* 22 23 18* 25  GLUCOSE 121* 122* 84 103* 127*  BUN 20 38* 48* 46* 46*  CREATININE 1.21* 2.59* 2.60* 2.36* 2.13*  CALCIUM 9.1 8.4* 7.3* 6.7* 7.5*  MG  --   --   --  1.8 2.2  PHOS  --   --   --  5.4*  --    Liver Function Tests:  Recent Labs Lab 09/29/14 1107 10/03/14 0309  AST 22  --   ALT 18  --   ALKPHOS 77  --   BILITOT 0.3  --   PROT 7.2  --   ALBUMIN 3.7 2.2*    Recent Labs Lab 09/29/14 1108  LIPASE 29   No results for input(s): AMMONIA in the last 168 hours. CBC:  Recent Labs Lab 09/29/14 1107 10/01/14 0942 10/02/14 0357 10/03/14 0309 10/04/14 0325  WBC 13.0* 14.5* 9.8 15.1* 14.7*  NEUTROABS 10.3*  --   --  12.8* 12.1*  HGB 12.5 12.4 10.7* 9.2* 8.9*  HCT 40.1 39.3 33.6* 29.2* 28.7*  MCV 96.4 95.6 95.7 98.6 98.3  PLT 283 315 243 245 222   Cardiac Enzymes:  Recent Labs Lab 09/29/14 1108  TROPONINI 0.03   BNP (last 3 results) No results for input(s): BNP in the last 8760 hours.  ProBNP (last 3 results) No results for input(s): PROBNP in the last 8760 hours.  CBG:  Recent Labs Lab 10/01/14 0404 10/01/14 2132 10/02/14 0558 10/02/14 0817 10/02/14 1315  GLUCAP 107* 90 82 74 159*    Recent Results (from the past 240 hour(s))  Culture, blood (routine x 2)     Status: None (Preliminary result)   Collection Time: 09/30/14 12:32 PM  Result Value Ref Range Status   Specimen  Description BLOOD LEFT HAND  Final   Special Requests BOTTLES DRAWN AEROBIC ONLY 10CC  Final   Culture   Final           BLOOD CULTURE RECEIVED NO GROWTH TO DATE CULTURE WILL BE HELD FOR 5 DAYS BEFORE ISSUING A FINAL NEGATIVE REPORT Performed at [facility]    Report Status PENDING  Incomplete  Culture, blood (routine x 2)     Status: None (Preliminary result)   Collection Time: 09/30/14 12:41 PM  Result Value Ref Range Status   Specimen Description BLOOD RIGHT HAND  Final   Special Requests BOTTLES DRAWN AEROBIC ONLY 8CC  Final   Culture   Final           BLOOD CULTURE RECEIVED NO GROWTH TO DATE CULTURE WILL BE HELD FOR 5 DAYS BEFORE ISSUING A FINAL NEGATIVE REPORT Performed at [facility]    Report Status PENDING  Incomplete  Culture, Urine     Status: None   Collection Time: 09/30/14  5:47 PM  Result Value Ref Range Status   Specimen Description URINE, RANDOM  Final   Special  Requests NONE  Final   Colony Count   Final    >=100,000 COLONIES/ML Performed at [facility]    Culture   Final    Multiple bacterial morphotypes present, none predominant. Suggest appropriate recollection if clinically indicated. Performed at [facility]    Report Status 10/01/2014 FINAL  Final  Culture, Urine     Status: None   Collection Time: 10/01/14  2:31 PM  Result Value Ref Range Status   Specimen Description URINE, RANDOM  Final   Special Requests NONE  Final   Colony Count   Final    >=100,000 COLONIES/ML Performed at [facility]    Culture   Final    ESCHERICHIA COLI Performed at [facility]    Report Status 10/03/2014 FINAL  Final   Organism ID, Bacteria ESCHERICHIA COLI  Final      Susceptibility   Escherichia coli - MIC*    AMPICILLIN >=32 RESISTANT Resistant     CEFAZOLIN 16 INTERMEDIATE Intermediate     CEFTRIAXONE <=1 SENSITIVE Sensitive     CIPROFLOXACIN >=4 RESISTANT Resistant     GENTAMICIN <=1 SENSITIVE Sensitive      LEVOFLOXACIN >=8 RESISTANT Resistant     NITROFURANTOIN <=16 SENSITIVE Sensitive     TOBRAMYCIN <=1 SENSITIVE Sensitive     TRIMETH/SULFA <=20 SENSITIVE Sensitive     PIP/TAZO <=4 SENSITIVE Sensitive     * ESCHERICHIA COLI  Surgical pcr screen     Status: None   Collection Time: 10/01/14 10:28 PM  Result Value Ref Range Status   MRSA, PCR NEGATIVE NEGATIVE Final   Staphylococcus aureus NEGATIVE NEGATIVE Final    Comment:        The Xpert SA Assay (FDA approved for NASAL specimens in patients over 71 years of age), is one component of a comprehensive surveillance program.  Test performance has been validated by [facility] for patients greater than or equal to 18 year old. It is not intended to diagnose infection nor to guide or monitor treatment.      Studies: Dg Chest Port 1 View  10/03/2014   CLINICAL DATA:  64 year old female with a history of leukocytosis.  EXAM: PORTABLE CHEST - 1 VIEW  COMPARISON:  Chest x-ray 12/20/2012, 08/09/2012. Prior abdominal CT 09/29/2014 out prior chest CT 05/19/2005  FINDINGS: Cardiac diameter unchanged. Right rotation of the patient accentuates the right heart border, with increased opacity along the right-sided mediastinum. Surgical changes in the right hilum.  No pneumothorax.  Elevation of the right hemidiaphragm. Right base not well visualized.  Interstitial opacities of the bilateral lungs.  Gastric tube projects over the mediastinum, terminating out of the field of view.  IMPRESSION: Limited chest x-ray demonstrates bilateral interstitial opacities and right basilar airspace disease, potentially multifocal infection including atypical infections as well as aspiration pneumonitis/pneumonia.  Surgical changes of the right hilar region, status post lobectomy. If the patient has a history of lung carcinoma, further evaluation with chest CT may be useful, as the lung changes are new from the most recent chest x-ray of 12/20/2012.  Gastric tube  projects over the mediastinum.  Signed,  Dulcy Fanny. Earleen Newport, DO  [facility]  [facility]   Electronically Signed   By: Corrie Mckusick D.O.   On: 10/03/2014 09:12    Scheduled Meds: . budesonide-formoterol  2 puff Inhalation BID  . cefTRIAXone (ROCEPHIN)  IV  1 g Intravenous Q24H  . enoxaparin (LOVENOX) injection  30 mg Subcutaneous  Q24H  . HYDROmorphone PCA 0.3 mg/mL   Intravenous 6 times per day  . metoprolol  2.5 mg Intravenous 3 times per day  . nicotine  7 mg Transdermal Daily  . pantoprazole (PROTONIX) IV  40 mg Intravenous Q24H  . pneumococcal 23 valent vaccine  0.5 mL Intramuscular Tomorrow-1000   Continuous Infusions: . sodium chloride 10 mL/hr at 10/02/14 0843  . dextrose 5 % 1,000 mL infusion 75 mL/hr at 10/04/14 0800    Principal Problem:   Colonic obstruction; Partial, secondary to benign sigmoid colonic stricture Active Problems:   Tobacco user   Rectovaginal fistula   Essential hypertension   COPD (chronic obstructive pulmonary disease)   Abdominal pain   CAD in native artery   CKD (chronic kidney disease) stage 3, GFR 30-59 ml/min   GERD (gastroesophageal reflux disease)   Bacteriuria   Abnormal nuclear stress test   Pre-operative cardiovascular examination, myocardial ischemia   ARF (acute renal failure)   Acute cystitis without hematuria   E-coli UTI   Leukocytosis    Time spent: 86 minutes    THOMPSON,DANIEL M.D. [facility] Pager [phone]. If 7PM-7AM, please contact night-coverage at [email or web address], password [facility] 10/04/2014, 9:51 AM  LOS: 5 days

## 2014-10-04 NOTE — Progress Notes (Signed)
2 Days Post-Op  Subjective: Complains of pain but seems to be improving  Objective: Vital signs in last 24 hours: Temp:  [98.3 F (36.8 C)-99 F (37.2 C)] 99 F (37.2 C) (06/04 0752) Pulse Rate:  [81-91] 88 (06/04 0752) Resp:  [12-22] 15 (06/04 0752) BP: (124-161)/(43-70) 157/70 mmHg (06/04 0752) SpO2:  [90 %-98 %] 90 % (06/04 0752) Last BM Date: 10/02/14  Intake/Output from previous day: 06/03 0701 - 06/04 0700 In: 739.6 [I.V.:589.6; IV Piggyback:50] Out: 3007 [Urine:1075] Intake/Output this shift: Total I/O In: -  Out: 175 [Urine:175]  Resp: clear to auscultation bilaterally Cardio: regular rate and rhythm GI: soft, appropriately tender. wound clean. ostomy dusky but viable, no output yet  Lab Results:   Recent Labs  10/03/14 0309 10/04/14 0325  WBC 15.1* 14.7*  HGB 9.2* 8.9*  HCT 29.2* 28.7*  PLT 245 222   BMET  Recent Labs  10/03/14 0309 10/04/14 0325  NA 145 149*  K 4.1 4.0  CL 117* 114*  CO2 18* 25  GLUCOSE 103* 127*  BUN 46* 46*  CREATININE 2.36* 2.13*  CALCIUM 6.7* 7.5*   PT/INR No results for input(s): LABPROT, INR in the last 72 hours. ABG No results for input(s): PHART, HCO3 in the last 72 hours.  Invalid input(s): PCO2, PO2  Studies/Results: Dg Chest Port 1 View  10/03/2014   CLINICAL DATA:  64 year old female with a history of leukocytosis.  EXAM: PORTABLE CHEST - 1 VIEW  COMPARISON:  Chest x-ray 12/20/2012, 08/09/2012. Prior abdominal CT 09/29/2014 out prior chest CT 05/19/2005  FINDINGS: Cardiac diameter unchanged. Right rotation of the patient accentuates the right heart border, with increased opacity along the right-sided mediastinum. Surgical changes in the right hilum.  No pneumothorax.  Elevation of the right hemidiaphragm. Right base not well visualized.  Interstitial opacities of the bilateral lungs.  Gastric tube projects over the mediastinum, terminating out of the field of view.  IMPRESSION: Limited chest x-ray demonstrates  bilateral interstitial opacities and right basilar airspace disease, potentially multifocal infection including atypical infections as well as aspiration pneumonitis/pneumonia.  Surgical changes of the right hilar region, status post lobectomy. If the patient has a history of lung carcinoma, further evaluation with chest CT may be useful, as the lung changes are new from the most recent chest x-ray of 12/20/2012.  Gastric tube projects over the mediastinum.  Signed,  Dulcy Fanny. Earleen Newport, DO  [facility]  [facility]   Electronically Signed   By: Corrie Mckusick D.O.   On: 10/03/2014 09:12    Anti-infectives: Anti-infectives    Start     Dose/Rate Route Frequency Ordered Stop   10/03/14 1530  cefTRIAXone (ROCEPHIN) 1 g in dextrose 5 % 50 mL IVPB - Premix     1 g 100 mL/hr over 30 Minutes Intravenous Every 24 hours 10/03/14 1454     10/02/14 2200  Levofloxacin (LEVAQUIN) IVPB 250 mg  Status:  Discontinued     250 mg 50 mL/hr over 60 Minutes Intravenous Every 24 hours 10/01/14 2105 10/02/14 2006   10/02/14 2200  Levofloxacin (LEVAQUIN) IVPB 250 mg  Status:  Discontinued     250 mg 50 mL/hr over 60 Minutes Intravenous Every 24 hours 10/02/14 1000 10/03/14 1454   10/02/14 0800  clindamycin (CLEOCIN) IVPB 900 mg     900 mg 100 mL/hr over 30 Minutes Intravenous To ShortStay Surgical 10/01/14 1238 10/02/14 0933   10/02/14 0800  gentamicin (GARAMYCIN) IVPB 100 mg  100 mg 200 mL/hr over 30 Minutes Intravenous To ShortStay Surgical 10/01/14 1238 10/02/14 0938   10/01/14 2200  levofloxacin (LEVAQUIN) IVPB 500 mg     500 mg 100 mL/hr over 60 Minutes Intravenous  Once 10/01/14 2059 10/02/14 0104      Assessment/Plan: s/p Procedure(s): EXPLORATORY LAPAROTOMY WITH PARTIAL COLECTOMY/COLOSTOMY (N/A) Continue ng and bowel rest  Pulmonary toilet OOB Continue dressing changes and abx  LOS: 5 days    TOTH III,Osiris Odriscoll S 10/04/2014

## 2014-10-05 ENCOUNTER — Inpatient Hospital Stay (HOSPITAL_COMMUNITY): Payer: Commercial Managed Care - HMO

## 2014-10-05 DIAGNOSIS — R0902 Hypoxemia: Secondary | ICD-10-CM | POA: Insufficient documentation

## 2014-10-05 LAB — CBC
HCT: 28.3 % — ABNORMAL LOW (ref 36.0–46.0)
HEMOGLOBIN: 8.5 g/dL — AB (ref 12.0–15.0)
MCH: 29.9 pg (ref 26.0–34.0)
MCHC: 30 g/dL (ref 30.0–36.0)
MCV: 99.6 fL (ref 78.0–100.0)
Platelets: 211 10*3/uL (ref 150–400)
RBC: 2.84 MIL/uL — AB (ref 3.87–5.11)
RDW: 15.8 % — ABNORMAL HIGH (ref 11.5–15.5)
WBC: 15.3 10*3/uL — ABNORMAL HIGH (ref 4.0–10.5)

## 2014-10-05 LAB — BASIC METABOLIC PANEL
ANION GAP: 11 (ref 5–15)
BUN: 33 mg/dL — ABNORMAL HIGH (ref 6–20)
CO2: 27 mmol/L (ref 22–32)
Calcium: 8 mg/dL — ABNORMAL LOW (ref 8.9–10.3)
Chloride: 109 mmol/L (ref 101–111)
Creatinine, Ser: 1.68 mg/dL — ABNORMAL HIGH (ref 0.44–1.00)
GFR calc Af Amer: 36 mL/min — ABNORMAL LOW (ref >60)
GFR calc non Af Amer: 31 mL/min — ABNORMAL LOW (ref >60)
Glucose, Bld: 126 mg/dL — ABNORMAL HIGH (ref 65–99)
POTASSIUM: 4 mmol/L (ref 3.5–5.1)
Sodium: 147 mmol/L — ABNORMAL HIGH (ref 135–145)

## 2014-10-05 LAB — MAGNESIUM: Magnesium: 2.2 mg/dL (ref 1.7–2.4)

## 2014-10-05 MED ORDER — FENTANYL 10 MCG/ML IV SOLN
INTRAVENOUS | Status: DC
  Administered 2014-10-05: 50 ug via INTRAVENOUS
  Administered 2014-10-05: 10:00:00 via INTRAVENOUS
  Administered 2014-10-05: 170 ug via INTRAVENOUS
  Administered 2014-10-05: 150 ug via INTRAVENOUS
  Administered 2014-10-06: 170 ug via INTRAVENOUS
  Administered 2014-10-06: 110 ug via INTRAVENOUS
  Administered 2014-10-06: 150 ug via INTRAVENOUS
  Administered 2014-10-06: 190 ug via INTRAVENOUS
  Administered 2014-10-06: 100 ug via INTRAVENOUS
  Administered 2014-10-06: 14:00:00 via INTRAVENOUS
  Administered 2014-10-07: 50 ug via INTRAVENOUS
  Administered 2014-10-07: 130 ug via INTRAVENOUS
  Administered 2014-10-07: 15:00:00 via INTRAVENOUS
  Administered 2014-10-07: 210 ug via INTRAVENOUS
  Administered 2014-10-07: 240 ug via INTRAVENOUS
  Administered 2014-10-07: 70 ug via INTRAVENOUS
  Administered 2014-10-07: 50 ug via INTRAVENOUS
  Administered 2014-10-08: 10 ug via INTRAVENOUS
  Administered 2014-10-08 (×2): 20 ug via INTRAVENOUS
  Administered 2014-10-08: 100 ug via INTRAVENOUS
  Administered 2014-10-08: 20 ug via INTRAVENOUS
  Administered 2014-10-09: 80 ug via INTRAVENOUS
  Administered 2014-10-09: 30 ug via INTRAVENOUS
  Administered 2014-10-09: 50 ug via INTRAVENOUS
  Filled 2014-10-05 (×7): qty 50

## 2014-10-05 MED ORDER — NALOXONE HCL 0.4 MG/ML IJ SOLN: 0.4000 mg | INTRAMUSCULAR | Status: DC | PRN

## 2014-10-05 MED ORDER — DIPHENHYDRAMINE HCL 50 MG/ML IJ SOLN: 12.5000 mg | Freq: Four times a day (QID) | INTRAMUSCULAR | Status: DC | PRN

## 2014-10-05 MED ORDER — DIPHENHYDRAMINE HCL 12.5 MG/5ML PO ELIX
12.5000 mg | ORAL_SOLUTION | Freq: Four times a day (QID) | ORAL | Status: DC | PRN
  Filled 2014-10-05: qty 5

## 2014-10-05 MED ORDER — ACETAMINOPHEN 10 MG/ML IV SOLN
1000.0000 mg | Freq: Four times a day (QID) | INTRAVENOUS | Status: AC
  Administered 2014-10-05 – 2014-10-06 (×4): 1000 mg via INTRAVENOUS
  Filled 2014-10-05 (×4): qty 100

## 2014-10-05 MED ORDER — SODIUM CHLORIDE 0.9 % IJ SOLN: 9.0000 mL | INTRAMUSCULAR | Status: DC | PRN

## 2014-10-05 MED ORDER — TIOTROPIUM BROMIDE MONOHYDRATE 18 MCG IN CAPS
18.0000 ug | ORAL_CAPSULE | Freq: Every day | RESPIRATORY_TRACT | Status: DC
  Administered 2014-10-05 – 2014-10-21 (×16): 18 ug via RESPIRATORY_TRACT
  Filled 2014-10-05 (×4): qty 5

## 2014-10-05 NOTE — Progress Notes (Signed)
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 Brantley Stage 10/02/2014. Patient with significant abdominal pain around incision site. Colostomy with some dark fluid. Hypoactive BS. Patient was also to have a cardiac catheterization however due to worsening renal function this was canceled. Per general surgery.  #2 coronary artery disease with intermediate risk Myoview Patient denies any chest or no shortness of breath. Patient had an abnormal Myoview and was subsequently scheduled for cardiac catheterization which was to be done, however due to worsening renal function cardiac catheterization has been canceled. Patient will likely need to follow-up with cardiology as outpatient.   #3 acute on chronic kidney disease III ( baseline creatinine 1.2-1.5) Questionable etiology. Differential includes prerenal versus renal secondary to ATN. Renal function slowly trending down currently at 1.68 from 2.13 from 2.36 from 2.60 from 2.59. Patient with recent CT scan done with contrast. Patient also with small bowel obstruction with several episodes of emesis and nausea with poor oral intake. FENa = 0.2%. Renal ultrasound negative for hydronephrosis. Decrease IV fluids to 50 mL per hour. Follow.  #4 hypertension Stable. Continue IV Lopressor.  #5 COPD Stable. Continued on home regimen of Symbicort. Add spiriva  #6 colovaginal fistula Patient status post sigmoid colon colectomy with end colostomy takedown of colovaginal fistula 10/02/2014 per Dr. Brantley Stage. Per general surgery.  #7 Ecoli UTI Patient states improvement with dysuria. Urine cultures with greater than 100,000 Escherichia coli. Continue IV Rocephin.  #8 abnormal chest  x-ray Chest x-ray with concerns for possible pneumonia. Patient afebrile. Patient does have a leukocytosis. Patient with no respiratory symptoms. Patient denies any shortness of breath. Patient denies any cough. Patient on IV Rocephin. Patient desats off oxygen, was not on oxygen at home. Repeat CXR pending. Continue IV Rocephin, IS. Add flutter valve. Follow.  #9 leukocytosis Likely secondary to problem #7 and reactive secondary to recent surgery. Patient has been pancultures and cultures are pending. Urine cultures with Escherichia coli. Blood cultures with no growth to date. Patient currently on IV Rocephin. Follow for now.  #10 tobacco abuse Nicotine patch. Tobacco cessation.  #11 prophylaxis PPI for GI prophylaxis. Lovenox for DVT prophylaxis.    Code Status: Full Family Communication: Updated patient and daughter at bedside. Disposition Plan: Remain in the stepdown unit. Hopefully transfer to regular floor tomorrow.   Consultants:  Gen. surgery: Dr. Brantley Stage 09/30/2014  Cardiology: Dr. Wilhemina Cash 09/30/2014  Procedures:  CT abdomen and pelvis 09/29/2014  Renal ultrasound 10/01/2014  Chest x-ray 10/03/2014, 10/05/14 Sigmoid colon colectomy with in colostomy takedown of colovaginal fistula per Dr. Brantley Stage 10/02/2014  Antibiotics:  IV Levaquin 10/01/2014 >>> 10/03/2014  IV Rocephin 10/03/2014  HPI/Subjective: Patient states breathing is at her baseline. Patient however desats with removal of oxygen. No CP.  Objective: Filed Vitals:   10/05/14 0721  BP: 158/62  Pulse: 75  Temp: 98.3 F (36.8 C)  Resp: 14    Intake/Output Summary (Last 24 hours) at 10/05/14 0946 Last data filed at 10/05/14 7510  Gross per 24 hour  Intake   1725 ml  Output   1325 ml  Net    400 ml   Filed Weights   09/29/14 1900 09/30/14 0534 10/01/14 0602  Weight: 65.409 kg (144 lb 3.2 oz) 66.497 kg (146 lb 9.6 oz) 66.679 kg (147 lb)  Exam:   General:  NAD. NGT in  place  Cardiovascular: RRR  Respiratory: CTAB  Abdomen: Soft, decreasing tenderness to palpation in mid abdomen and around incision site. hypoactive bowel sounds, Colostomy with dark liquid output.  Musculoskeletal: No clubbing cyanosis or edema.  Data Reviewed: Basic Metabolic Panel:  Recent Labs Lab 10/01/14 0942 10/02/14 0357 10/03/14 0309 10/04/14 0325 10/05/14 0303  NA 140 141 145 149* 147*  K 4.1 4.0 4.1 4.0 4.0  CL 105 108 117* 114* 109  CO2 22 23 18* 25 27  GLUCOSE 122* 84 103* 127* 126*  BUN 38* 48* 46* 46* 33*  CREATININE 2.59* 2.60* 2.36* 2.13* 1.68*  CALCIUM 8.4* 7.3* 6.7* 7.5* 8.0*  MG  --   --  1.8 2.2 2.2  PHOS  --   --  5.4*  --   --    Liver Function Tests:  Recent Labs Lab 09/29/14 1107 10/03/14 0309  AST 22  --   ALT 18  --   ALKPHOS 77  --   BILITOT 0.3  --   PROT 7.2  --   ALBUMIN 3.7 2.2*    Recent Labs Lab 09/29/14 1108  LIPASE 29   No results for input(s): AMMONIA in the last 168 hours. CBC:  Recent Labs Lab 09/29/14 1107 10/01/14 0942 10/02/14 0357 10/03/14 0309 10/04/14 0325 10/05/14 0303  WBC 13.0* 14.5* 9.8 15.1* 14.7* 15.3*  NEUTROABS 10.3*  --   --  12.8* 12.1*  --   HGB 12.5 12.4 10.7* 9.2* 8.9* 8.5*  HCT 40.1 39.3 33.6* 29.2* 28.7* 28.3*  MCV 96.4 95.6 95.7 98.6 98.3 99.6  PLT 283 315 243 245 222 211   Cardiac Enzymes:  Recent Labs Lab 09/29/14 1108  TROPONINI 0.03   BNP (last 3 results) No results for input(s): BNP in the last 8760 hours.  ProBNP (last 3 results) No results for input(s): PROBNP in the last 8760 hours.  CBG:  Recent Labs Lab 10/01/14 0404 10/01/14 2132 10/02/14 0558 10/02/14 0817 10/02/14 1315  GLUCAP 107* 90 82 74 159*    Recent Results (from the past 240 hour(s))  Culture, blood (routine x 2)     Status: None (Preliminary result)   Collection Time: 09/30/14 12:32 PM  Result Value Ref Range Status   Specimen Description BLOOD LEFT HAND  Final   Special Requests BOTTLES  DRAWN AEROBIC ONLY 10CC  Final   Culture   Final           BLOOD CULTURE RECEIVED NO GROWTH TO DATE CULTURE WILL BE HELD FOR 5 DAYS BEFORE ISSUING A FINAL NEGATIVE REPORT Performed at [facility]    Report Status PENDING  Incomplete  Culture, blood (routine x 2)     Status: None (Preliminary result)   Collection Time: 09/30/14 12:41 PM  Result Value Ref Range Status   Specimen Description BLOOD RIGHT HAND  Final   Special Requests BOTTLES DRAWN AEROBIC ONLY 8CC  Final   Culture   Final           BLOOD CULTURE RECEIVED NO GROWTH TO DATE CULTURE WILL BE HELD FOR 5 DAYS BEFORE ISSUING A FINAL NEGATIVE REPORT Performed at [facility]    Report Status PENDING  Incomplete  Culture, Urine     Status: None   Collection Time: 09/30/14  5:47 PM  Result Value Ref Range Status   Specimen Description URINE, RANDOM  Final   Special Requests NONE  Final   Colony Count  Final    >=100,000 COLONIES/ML Performed at [facility]    Culture   Final    Multiple bacterial morphotypes present, none predominant. Suggest appropriate recollection if clinically indicated. Performed at [facility]    Report Status 10/01/2014 FINAL  Final  Culture, Urine     Status: None   Collection Time: 10/01/14  2:31 PM  Result Value Ref Range Status   Specimen Description URINE, RANDOM  Final   Special Requests NONE  Final   Colony Count   Final    >=100,000 COLONIES/ML Performed at [facility]    Culture   Final    ESCHERICHIA COLI Performed at [facility]    Report Status 10/03/2014 FINAL  Final   Organism ID, Bacteria ESCHERICHIA COLI  Final      Susceptibility   Escherichia coli - MIC*    AMPICILLIN >=32 RESISTANT Resistant     CEFAZOLIN 16 INTERMEDIATE Intermediate     CEFTRIAXONE <=1 SENSITIVE Sensitive     CIPROFLOXACIN >=4 RESISTANT Resistant     GENTAMICIN <=1 SENSITIVE Sensitive     LEVOFLOXACIN >=8 RESISTANT Resistant     NITROFURANTOIN  <=16 SENSITIVE Sensitive     TOBRAMYCIN <=1 SENSITIVE Sensitive     TRIMETH/SULFA <=20 SENSITIVE Sensitive     PIP/TAZO <=4 SENSITIVE Sensitive     * ESCHERICHIA COLI  Surgical pcr screen     Status: None   Collection Time: 10/01/14 10:28 PM  Result Value Ref Range Status   MRSA, PCR NEGATIVE NEGATIVE Final   Staphylococcus aureus NEGATIVE NEGATIVE Final    Comment:        The Xpert SA Assay (FDA approved for NASAL specimens in patients over 56 years of age), is one component of a comprehensive surveillance program.  Test performance has been validated by [facility] for patients greater than or equal to 100 year old. It is not intended to diagnose infection nor to guide or monitor treatment.      Studies: Dg Chest Port 1 View  10/05/2014   CLINICAL DATA:  Hypoxia  EXAM: PORTABLE CHEST - 1 VIEW  COMPARISON:  Radiograph 10/03/2014  FINDINGS: NG tube extends the stomach. Stable mildly enlarged cardiac silhouette. There is interval improvement in basilar atelectasis seen on prior. Mild peripheral linear pattern remains.  IMPRESSION: 1. Improvement in right basilar atelectasis. 2. Mild interstitial edema pattern.   Electronically Signed   By: Suzy Bouchard M.D.   On: 10/05/2014 09:41    Scheduled Meds: . acetaminophen  1,000 mg Intravenous 4 times per day  . budesonide-formoterol  2 puff Inhalation BID  . cefTRIAXone (ROCEPHIN)  IV  1 g Intravenous Q24H  . enoxaparin (LOVENOX) injection  30 mg Subcutaneous Q24H  . fentaNYL   Intravenous 6 times per day  . metoprolol  2.5 mg Intravenous 3 times per day  . nicotine  7 mg Transdermal Daily  . pantoprazole (PROTONIX) IV  40 mg Intravenous Q24H  . pneumococcal 23 valent vaccine  0.5 mL Intramuscular Tomorrow-1000   Continuous Infusions: . sodium chloride 10 mL/hr at 10/02/14 0843  . dextrose 5 % 1,000 mL infusion 50 mL/hr at 10/05/14 1287    Principal Problem:   Colonic obstruction; Partial, secondary to benign sigmoid colonic  stricture Active Problems:   Tobacco user   Rectovaginal fistula   Essential hypertension   COPD (chronic obstructive pulmonary disease)   Abdominal pain   CAD in native artery   CKD (chronic kidney disease)  stage 3, GFR 30-59 ml/min   GERD (gastroesophageal reflux disease)   Bacteriuria   Abnormal nuclear stress test   Pre-operative cardiovascular examination, myocardial ischemia   ARF (acute renal failure)   Acute cystitis without hematuria   E-coli UTI   Leukocytosis    Time spent: 75 minutes    Naome Brigandi M.D. [facility] Pager [phone]. If 7PM-7AM, please contact night-coverage at [email or web address], password [facility] 10/05/2014, 9:46 AM  LOS: 6 days

## 2014-10-05 NOTE — Progress Notes (Signed)
Pt stoma site noted to be dark red in color. Dr. Marlou Starks made aware of discoloration. No new orders, will continue to monitor.

## 2014-10-05 NOTE — Progress Notes (Signed)
Patient ID: Alexa Key, female   DOB: 1950-11-27, 64 y.o.   MRN: [id number]     [place]      [address] [place], [place] [address]    Phone: [phone] FAX: [phone]     Subjective: Pulls 232m on IS.  sats down.   Objective:  Vital signs:  Filed Vitals:   10/04/14 2019 10/04/14 2352 10/05/14 0311 10/05/14 0721  BP:  162/78 134/59 158/62  Pulse:  83 75 75  Temp:  98.3 F (36.8 C) 98.1 F (36.7 C) 98.3 F (36.8 C)  TempSrc:  Oral Oral Oral  Resp:  '13 11 14  ' Height:      Weight:      SpO2: 98% 97% 97% 98%    Last BM Date: 10/04/14 (dark liquid stool from colostomy)  Intake/Output   Yesterday:  06/04 0701 - 06/05 0700 In: 1800 [I.V.:1800] Out: 14627[Urine:1025; Emesis/NG output:250] This shift:  Total I/O In: -  Out: 225 [Urine:225]  Physical Exam: General: Pt awake/alert/oriented x4 in no acute distress  Abdomen: Soft.  Nondistended.  Mild gen ttp.  Midline wound is clean.  Ostomy with liquid stool, ostomy is purple.  No evidence of peritonitis.  No incarcerated hernias.    Problem List:   Principal Problem:   Colonic obstruction; Partial, secondary to benign sigmoid colonic stricture Active Problems:   Tobacco user   Rectovaginal fistula   Essential hypertension   COPD (chronic obstructive pulmonary disease)   Abdominal pain   CAD in native artery   CKD (chronic kidney disease) stage 3, GFR 30-59 ml/min   GERD (gastroesophageal reflux disease)   Bacteriuria   Abnormal nuclear stress test   Pre-operative cardiovascular examination, myocardial ischemia   ARF (acute renal failure)   Acute cystitis without hematuria   E-coli UTI   Leukocytosis    Results:   Labs: Results for orders placed or performed during the hospital encounter of 09/29/14 (from the past 48 hour(s))  CBC with Differential/Platelet     Status: Abnormal   Collection Time: 10/04/14  3:25 AM  Result Value Ref Range    WBC 14.7 (H) 4.0 - 10.5 K/uL   RBC 2.92 (L) 3.87 - 5.11 MIL/uL   Hemoglobin 8.9 (L) 12.0 - 15.0 g/dL   HCT 28.7 (L) 36.0 - 46.0 %   MCV 98.3 78.0 - 100.0 fL   MCH 30.5 26.0 - 34.0 pg   MCHC 31.0 30.0 - 36.0 g/dL   RDW 16.0 (H) 11.5 - 15.5 %   Platelets 222 150 - 400 K/uL   Neutrophils Relative % 83 (H) 43 - 77 %   Neutro Abs 12.1 (H) 1.7 - 7.7 K/uL   Lymphocytes Relative 8 (L) 12 - 46 %   Lymphs Abs 1.2 0.7 - 4.0 K/uL   Monocytes Relative 9 3 - 12 %   Monocytes Absolute 1.3 (H) 0.1 - 1.0 K/uL   Eosinophils Relative 0 0 - 5 %   Eosinophils Absolute 0.0 0.0 - 0.7 K/uL   Basophils Relative 0 0 - 1 %   Basophils Absolute 0.0 0.0 - 0.1 K/uL  Basic metabolic panel     Status: Abnormal   Collection Time: 10/04/14  3:25 AM  Result Value Ref Range   Sodium 149 (H) 135 - 145 mmol/L   Potassium 4.0 3.5 - 5.1 mmol/L   Chloride 114 (H) 101 - 111 mmol/L   CO2 25 22 - 32 mmol/L  Glucose, Bld 127 (H) 65 - 99 mg/dL   BUN 46 (H) 6 - 20 mg/dL   Creatinine, Ser 2.13 (H) 0.44 - 1.00 mg/dL   Calcium 7.5 (L) 8.9 - 10.3 mg/dL   GFR calc non Af Amer 23 (L) >60 mL/min   GFR calc Af Amer 27 (L) >60 mL/min    Comment: (NOTE) The eGFR has been calculated using the CKD EPI equation. This calculation has not been validated in all clinical situations. eGFR's persistently <60 mL/min signify possible Chronic Kidney Disease.    Anion gap 10 5 - 15  Magnesium     Status: None   Collection Time: 10/04/14  3:25 AM  Result Value Ref Range   Magnesium 2.2 1.7 - 2.4 mg/dL  CBC     Status: Abnormal   Collection Time: 10/05/14  3:03 AM  Result Value Ref Range   WBC 15.3 (H) 4.0 - 10.5 K/uL   RBC 2.84 (L) 3.87 - 5.11 MIL/uL   Hemoglobin 8.5 (L) 12.0 - 15.0 g/dL   HCT 28.3 (L) 36.0 - 46.0 %   MCV 99.6 78.0 - 100.0 fL   MCH 29.9 26.0 - 34.0 pg   MCHC 30.0 30.0 - 36.0 g/dL   RDW 15.8 (H) 11.5 - 15.5 %   Platelets 211 150 - 400 K/uL  Basic metabolic panel     Status: Abnormal   Collection Time: 10/05/14   3:03 AM  Result Value Ref Range   Sodium 147 (H) 135 - 145 mmol/L   Potassium 4.0 3.5 - 5.1 mmol/L   Chloride 109 101 - 111 mmol/L   CO2 27 22 - 32 mmol/L   Glucose, Bld 126 (H) 65 - 99 mg/dL   BUN 33 (H) 6 - 20 mg/dL   Creatinine, Ser 1.68 (H) 0.44 - 1.00 mg/dL   Calcium 8.0 (L) 8.9 - 10.3 mg/dL   GFR calc non Af Amer 31 (L) >60 mL/min   GFR calc Af Amer 36 (L) >60 mL/min    Comment: (NOTE) The eGFR has been calculated using the CKD EPI equation. This calculation has not been validated in all clinical situations. eGFR's persistently <60 mL/min signify possible Chronic Kidney Disease.    Anion gap 11 5 - 15  Magnesium     Status: None   Collection Time: 10/05/14  3:03 AM  Result Value Ref Range   Magnesium 2.2 1.7 - 2.4 mg/dL    Imaging / Studies: Dg Chest Port 1 View  10/03/2014   CLINICAL DATA:  64 year old female with a history of leukocytosis.  EXAM: PORTABLE CHEST - 1 VIEW  COMPARISON:  Chest x-ray 12/20/2012, 08/09/2012. Prior abdominal CT 09/29/2014 out prior chest CT 05/19/2005  FINDINGS: Cardiac diameter unchanged. Right rotation of the patient accentuates the right heart border, with increased opacity along the right-sided mediastinum. Surgical changes in the right hilum.  No pneumothorax.  Elevation of the right hemidiaphragm. Right base not well visualized.  Interstitial opacities of the bilateral lungs.  Gastric tube projects over the mediastinum, terminating out of the field of view.  IMPRESSION: Limited chest x-ray demonstrates bilateral interstitial opacities and right basilar airspace disease, potentially multifocal infection including atypical infections as well as aspiration pneumonitis/pneumonia.  Surgical changes of the right hilar region, status post lobectomy. If the patient has a history of lung carcinoma, further evaluation with chest CT may be useful, as the lung changes are new from the most recent chest x-ray of 12/20/2012.  Gastric tube projects over the  mediastinum.  Signed,  Dulcy Fanny. Earleen Newport, DO  [facility]  [facility]   Electronically Signed   By: Corrie Mckusick D.O.   On: 10/03/2014 09:12    Medications / Allergies:  Scheduled Meds: . budesonide-formoterol  2 puff Inhalation BID  . cefTRIAXone (ROCEPHIN)  IV  1 g Intravenous Q24H  . enoxaparin (LOVENOX) injection  30 mg Subcutaneous Q24H  . HYDROmorphone PCA 0.3 mg/mL   Intravenous 6 times per day  . metoprolol  2.5 mg Intravenous 3 times per day  . nicotine  7 mg Transdermal Daily  . pantoprazole (PROTONIX) IV  40 mg Intravenous Q24H  . pneumococcal 23 valent vaccine  0.5 mL Intramuscular Tomorrow-1000   Continuous Infusions: . sodium chloride 10 mL/hr at 10/02/14 0843  . dextrose 5 % 1,000 mL infusion 75 mL/hr at 10/05/14 0700   PRN Meds:.acetaminophen **OR** acetaminophen, diphenhydrAMINE **OR** diphenhydrAMINE, HYDROmorphone (DILAUDID) injection, menthol-cetylpyridinium, naloxone **AND** sodium chloride, ondansetron **OR** ondansetron (ZOFRAN) IV, ondansetron (ZOFRAN) IV, phenol, promethazine, zolpidem  Antibiotics: Anti-infectives    Start     Dose/Rate Route Frequency Ordered Stop   10/03/14 1530  cefTRIAXone (ROCEPHIN) 1 g in dextrose 5 % 50 mL IVPB - Premix     1 g 100 mL/hr over 30 Minutes Intravenous Every 24 hours 10/03/14 1454     10/02/14 2200  Levofloxacin (LEVAQUIN) IVPB 250 mg  Status:  Discontinued     250 mg 50 mL/hr over 60 Minutes Intravenous Every 24 hours 10/01/14 2105 10/02/14 2006   10/02/14 2200  Levofloxacin (LEVAQUIN) IVPB 250 mg  Status:  Discontinued     250 mg 50 mL/hr over 60 Minutes Intravenous Every 24 hours 10/02/14 1000 10/03/14 1454   10/02/14 0800  clindamycin (CLEOCIN) IVPB 900 mg     900 mg 100 mL/hr over 30 Minutes Intravenous To ShortStay Surgical 10/01/14 1238 10/02/14 0933   10/02/14 0800  gentamicin (GARAMYCIN) IVPB 100 mg     100 mg 200 mL/hr over 30 Minutes Intravenous To ShortStay  Surgical 10/01/14 1238 10/02/14 0938   10/01/14 2200  levofloxacin (LEVAQUIN) IVPB 500 mg     500 mg 100 mL/hr over 60 Minutes Intravenous  Once 10/01/14 2059 10/02/14 0104        Assessment/Plan Large bowel obstruction secondary to sigmoid colon stricture and colovaginal fistula  POD#3 exploratory laparotomy with sigmoid colectomy, colovaginal fistula takedown and colostomy--Dr. Brantley Stage -change to reduced fentanyl PCA, add tylenol IV -PT eval -stoma is purple, but viable, monitor -BID wet to dry dressing changes ID-on rocephin for UTI.  WBC increased to 15.3k.  If it continues to increase or fevers develops, may need a CT of abdomen, but too early at this point  Hypoxia-only pulling 217m in IS, check CXR.  No tachycardia. Further work up per primary team FEN-TNA, start clamping trial, check residuals, allow for small sips from the floor.     EErby Pian ANP-BC [facility]   10/05/2014 8:55 AM

## 2014-10-06 LAB — CBC
HEMATOCRIT: 27.5 % — AB (ref 36.0–46.0)
HEMOGLOBIN: 8.5 g/dL — AB (ref 12.0–15.0)
MCH: 30.6 pg (ref 26.0–34.0)
MCHC: 30.9 g/dL (ref 30.0–36.0)
MCV: 98.9 fL (ref 78.0–100.0)
Platelets: 218 10*3/uL (ref 150–400)
RBC: 2.78 MIL/uL — AB (ref 3.87–5.11)
RDW: 14.9 % (ref 11.5–15.5)
WBC: 14.3 10*3/uL — ABNORMAL HIGH (ref 4.0–10.5)

## 2014-10-06 LAB — CULTURE, BLOOD (ROUTINE X 2)
Culture: NO GROWTH
Culture: NO GROWTH

## 2014-10-06 LAB — BASIC METABOLIC PANEL
ANION GAP: 9 (ref 5–15)
BUN: 23 mg/dL — ABNORMAL HIGH (ref 6–20)
CHLORIDE: 109 mmol/L (ref 101–111)
CO2: 27 mmol/L (ref 22–32)
CREATININE: 1.37 mg/dL — AB (ref 0.44–1.00)
Calcium: 8.2 mg/dL — ABNORMAL LOW (ref 8.9–10.3)
GFR calc Af Amer: 45 mL/min — ABNORMAL LOW (ref >60)
GFR, EST NON AFRICAN AMERICAN: 39 mL/min — AB (ref >60)
Glucose, Bld: 111 mg/dL — ABNORMAL HIGH (ref 65–99)
Potassium: 3.9 mmol/L (ref 3.5–5.1)
Sodium: 145 mmol/L (ref 135–145)

## 2014-10-06 MED ORDER — OXYCODONE HCL 5 MG PO TABS
5.0000 mg | ORAL_TABLET | Freq: Four times a day (QID) | ORAL | Status: DC | PRN
  Administered 2014-10-09: 5 mg via ORAL
  Filled 2014-10-06: qty 1

## 2014-10-06 MED ORDER — ACETAMINOPHEN 500 MG PO TABS
1000.0000 mg | ORAL_TABLET | Freq: Three times a day (TID) | ORAL | Status: DC
  Administered 2014-10-06: 500 mg via ORAL
  Administered 2014-10-06 – 2014-10-07 (×4): 1000 mg via ORAL
  Filled 2014-10-06 (×6): qty 2

## 2014-10-06 MED ORDER — METOPROLOL TARTRATE 1 MG/ML IV SOLN
7.5000 mg | Freq: Three times a day (TID) | INTRAVENOUS | Status: DC
  Administered 2014-10-06 – 2014-10-07 (×2): 7.5 mg via INTRAVENOUS
  Filled 2014-10-06 (×3): qty 10

## 2014-10-06 MED ORDER — HYDRALAZINE HCL 20 MG/ML IJ SOLN
10.0000 mg | Freq: Four times a day (QID) | INTRAMUSCULAR | Status: DC | PRN
  Administered 2014-10-06: 10 mg via INTRAVENOUS
  Filled 2014-10-06: qty 1

## 2014-10-06 MED ORDER — ENALAPRILAT 1.25 MG/ML IV SOLN
0.6250 mg | Freq: Four times a day (QID) | INTRAVENOUS | Status: DC
  Administered 2014-10-06 – 2014-10-07 (×4): 0.625 mg via INTRAVENOUS
  Filled 2014-10-06 (×7): qty 0.5

## 2014-10-06 MED ORDER — ENOXAPARIN SODIUM 40 MG/0.4ML ~~LOC~~ SOLN
40.0000 mg | Freq: Every day | SUBCUTANEOUS | Status: DC
  Administered 2014-10-07 – 2014-10-12 (×6): 40 mg via SUBCUTANEOUS
  Filled 2014-10-06 (×6): qty 0.4

## 2014-10-06 MED ORDER — METOPROLOL TARTRATE 1 MG/ML IV SOLN
5.0000 mg | Freq: Three times a day (TID) | INTRAVENOUS | Status: DC
  Administered 2014-10-06: 5 mg via INTRAVENOUS
  Filled 2014-10-06: qty 5

## 2014-10-06 NOTE — Progress Notes (Signed)
Patient ID: Alexa Key, female   DOB: 1950/10/08, 64 y.o.   MRN: [id number]     [facility]      [address] [place], [place] [address]    Phone: [phone] FAX: [phone]     Subjective: Tolerated sips.  VSS.  Afebrile.  Sore.  No n/v.   Got up to the chair yesterday.   Objective:  Vital signs:  Filed Vitals:   10/06/14 0430 10/06/14 0435 10/06/14 0733 10/06/14 0800  BP: 166/77     Pulse: 84     Temp:  97.5 F (36.4 C) 97.5 F (36.4 C)   TempSrc:  Oral Oral   Resp: 21   21  Height:      Weight:      SpO2: 93%   92%    Last BM Date: 10/05/14  Intake/Output   Yesterday:  06/05 0701 - 06/06 0700 In: 1214.6 [I.V.:1214.6] Out: 0923 [Urine:1650] This shift:    I/O last 3 completed shifts: In: 2189.6 [I.V.:2189.6] Out: 60 [Urine:2100; Emesis/NG output:250]    Physical Exam: General: Pt awake/alert/oriented x4 in no  acute distress Abdomen: Soft.  Nondistended.  Mildly tender at incisions only.  Wound is c/d/i.  Stoma is necrotic on top, but base looks okay, functioning.  No evidence of peritonitis.  No incarcerated hernias.    Problem List:   Principal Problem:   Colonic obstruction; Partial, secondary to benign sigmoid colonic stricture Active Problems:   Tobacco user   Rectovaginal fistula   Essential hypertension   COPD (chronic obstructive pulmonary disease)   Abdominal pain   CAD in native artery   CKD (chronic kidney disease) stage 3, GFR 30-59 ml/min   GERD (gastroesophageal reflux disease)   Bacteriuria   Abnormal nuclear stress test   Pre-operative cardiovascular examination, myocardial ischemia   ARF (acute renal failure)   Acute cystitis without hematuria   E-coli UTI   Leukocytosis   Hypoxia    Results:   Labs: Results for orders placed or performed during the hospital encounter of 09/29/14 (from the past 48 hour(s))  CBC     Status: Abnormal   Collection Time: 10/05/14  3:03  AM  Result Value Ref Range   WBC 15.3 (H) 4.0 - 10.5 K/uL   RBC 2.84 (L) 3.87 - 5.11 MIL/uL   Hemoglobin 8.5 (L) 12.0 - 15.0 g/dL   HCT 28.3 (L) 36.0 - 46.0 %   MCV 99.6 78.0 - 100.0 fL   MCH 29.9 26.0 - 34.0 pg   MCHC 30.0 30.0 - 36.0 g/dL   RDW 15.8 (H) 11.5 - 15.5 %   Platelets 211 150 - 400 K/uL  Basic metabolic panel     Status: Abnormal   Collection Time: 10/05/14  3:03 AM  Result Value Ref Range   Sodium 147 (H) 135 - 145 mmol/L   Potassium 4.0 3.5 - 5.1 mmol/L   Chloride 109 101 - 111 mmol/L   CO2 27 22 - 32 mmol/L   Glucose, Bld 126 (H) 65 - 99 mg/dL   BUN 33 (H) 6 - 20 mg/dL   Creatinine, Ser 1.68 (H) 0.44 - 1.00 mg/dL   Calcium 8.0 (L) 8.9 - 10.3 mg/dL   GFR calc non Af Amer 31 (L) >60 mL/min   GFR calc Af Amer 36 (L) >60 mL/min    Comment: (NOTE) The eGFR has been calculated using the CKD EPI equation. This calculation has not been validated in  all clinical situations. eGFR's persistently <60 mL/min signify possible Chronic Kidney Disease.    Anion gap 11 5 - 15  Magnesium     Status: None   Collection Time: 10/05/14  3:03 AM  Result Value Ref Range   Magnesium 2.2 1.7 - 2.4 mg/dL  CBC     Status: Abnormal   Collection Time: 10/06/14  3:09 AM  Result Value Ref Range   WBC 14.3 (H) 4.0 - 10.5 K/uL   RBC 2.78 (L) 3.87 - 5.11 MIL/uL   Hemoglobin 8.5 (L) 12.0 - 15.0 g/dL   HCT 27.5 (L) 36.0 - 46.0 %   MCV 98.9 78.0 - 100.0 fL   MCH 30.6 26.0 - 34.0 pg   MCHC 30.9 30.0 - 36.0 g/dL   RDW 14.9 11.5 - 15.5 %   Platelets 218 150 - 400 K/uL  Basic metabolic panel     Status: Abnormal   Collection Time: 10/06/14  3:09 AM  Result Value Ref Range   Sodium 145 135 - 145 mmol/L   Potassium 3.9 3.5 - 5.1 mmol/L   Chloride 109 101 - 111 mmol/L   CO2 27 22 - 32 mmol/L   Glucose, Bld 111 (H) 65 - 99 mg/dL   BUN 23 (H) 6 - 20 mg/dL   Creatinine, Ser 1.37 (H) 0.44 - 1.00 mg/dL   Calcium 8.2 (L) 8.9 - 10.3 mg/dL   GFR calc non Af Amer 39 (L) >60 mL/min   GFR calc Af  Amer 45 (L) >60 mL/min    Comment: (NOTE) The eGFR has been calculated using the CKD EPI equation. This calculation has not been validated in all clinical situations. eGFR's persistently <60 mL/min signify possible Chronic Kidney Disease.    Anion gap 9 5 - 15    Imaging / Studies: Dg Chest Port 1 View  10/05/2014   CLINICAL DATA:  Hypoxia  EXAM: PORTABLE CHEST - 1 VIEW  COMPARISON:  Radiograph 10/03/2014  FINDINGS: NG tube extends the stomach. Stable mildly enlarged cardiac silhouette. There is interval improvement in basilar atelectasis seen on prior. Mild peripheral linear pattern remains.  IMPRESSION: 1. Improvement in right basilar atelectasis. 2. Mild interstitial edema pattern.   Electronically Signed   By: Suzy Bouchard M.D.   On: 10/05/2014 09:41    Medications / Allergies:  Scheduled Meds: . acetaminophen  1,000 mg Oral TID  . budesonide-formoterol  2 puff Inhalation BID  . cefTRIAXone (ROCEPHIN)  IV  1 g Intravenous Q24H  . enoxaparin (LOVENOX) injection  30 mg Subcutaneous Q24H  . fentaNYL   Intravenous 6 times per day  . metoprolol  2.5 mg Intravenous 3 times per day  . nicotine  7 mg Transdermal Daily  . pantoprazole (PROTONIX) IV  40 mg Intravenous Q24H  . pneumococcal 23 valent vaccine  0.5 mL Intramuscular Tomorrow-1000  . tiotropium  18 mcg Inhalation Daily   Continuous Infusions: . sodium chloride 10 mL/hr at 10/02/14 0843   PRN Meds:.diphenhydrAMINE **OR** diphenhydrAMINE, menthol-cetylpyridinium, naloxone **AND** sodium chloride, ondansetron **OR** ondansetron (ZOFRAN) IV, oxyCODONE, phenol, promethazine, zolpidem  Antibiotics: Anti-infectives    Start     Dose/Rate Route Frequency Ordered Stop   10/03/14 1530  cefTRIAXone (ROCEPHIN) 1 g in dextrose 5 % 50 mL IVPB - Premix     1 g 100 mL/hr over 30 Minutes Intravenous Every 24 hours 10/03/14 1454     10/02/14 2200  Levofloxacin (LEVAQUIN) IVPB 250 mg  Status:  Discontinued     250 mg  50 mL/hr over 60  Minutes Intravenous Every 24 hours 10/01/14 2105 10/02/14 2006   10/02/14 2200  Levofloxacin (LEVAQUIN) IVPB 250 mg  Status:  Discontinued     250 mg 50 mL/hr over 60 Minutes Intravenous Every 24 hours 10/02/14 1000 10/03/14 1454   10/02/14 0800  clindamycin (CLEOCIN) IVPB 900 mg     900 mg 100 mL/hr over 30 Minutes Intravenous To [facility] 10/01/14 1238 10/02/14 0933   10/02/14 0800  gentamicin (GARAMYCIN) IVPB 100 mg     100 mg 200 mL/hr over 30 Minutes Intravenous To [facility] 10/01/14 1238 10/02/14 0938   10/01/14 2200  levofloxacin (LEVAQUIN) IVPB 500 mg     500 mg 100 mL/hr over 60 Minutes Intravenous  Once 10/01/14 2059 10/02/14 0104        Assessment/Plan Large bowel obstruction secondary to sigmoid colon stricture and colovaginal fistula  POD#4 exploratory laparotomy with sigmoid colectomy, colovaginal fistula takedown and colostomy--Dr. Brantley Stage -PT eval -stoma is purple, but viable, monitor -BID wet to dry dressing changes -may DC foley from our standpoint ID-on rocephin for UTI. WBC trending down. VTE prophylaxis-SCD/heparin Pulmonary-hypoxia has resolved, CXR ok..  Has COPD.  Pulling 570m on IS which is improved.  FEN-clears, advance to fulls for lunch.  Add scheduled tylenol, oxycodone.  Hopefully can stop fentanyl PCA over the next 24 hours.   EErby Pian [facility] Pager [phone] For consults and floor pages call [phone](7A-4:30P)  10/06/2014 9:05 AM

## 2014-10-06 NOTE — Progress Notes (Signed)
CM spoke  with pt regarding potential HH needs , pt with new colostomy. Pt would like nurse to come to home upon discharge for f/u.Mar[facility] Kitchen Choice given to pt and pt agreed with [facility]. Pt will need order from MD for [facility] services when d/c. Referral made with [facility] with [place], [phone]. CM to continue following for d/c needs.

## 2014-10-06 NOTE — Evaluation (Signed)
Physical Therapy Evaluation Patient Details Name: Alexa Key MRN: [id number] DOB: 01/07/1951 Today's Date: 10/06/2014   History of Present Illness  Patient is a 64 y/o female s/p exploratory laparotomy with sigmoid colectomy, colovaginal fistula takedown and colostomy 6/2. PMH includes CAD,PVD, CVA, HTN,COPD, tobacco use, fibromyalgia and DM.  Clinical Impression  Patient presents with weakness, balance deficits and pain s/p above surgery impacting mobility. Pt lives alone and was independent PTA. Pt requires Min-Mod A for bed mobility, transfers and ambulation. Might benefit from RW for support. Would benefit from skilled PT and ST SNF to maximize independence and mobility prior to return home as pt will not have 24/7 S at d/c.    Follow Up Recommendations SNF;Supervision/Assistance - 24 hour    Equipment Recommendations  Other (comment) (defer to next venue)    Recommendations for Other Services       Precautions / Restrictions Precautions Precautions: Fall Restrictions Weight Bearing Restrictions: No      Mobility  Bed Mobility Overal bed mobility: Needs Assistance Bed Mobility: Rolling;Sidelying to Sit Rolling: Min assist Sidelying to sit: Mod assist;HOB elevated       General bed mobility comments: Cues for log roll technique. Assist to roll and elevate trunk. Cues for technique. + dizziness which resolved quickly.  Transfers Overall transfer level: Needs assistance Equipment used: 1 person hand held assist Transfers: Sit to/from Stand Sit to Stand: Min assist         General transfer comment: Min A to rise from EOB x1, from toilet x1 with cues for hand placement and technique. + dizziness which resolved quickly.   Ambulation/Gait Ambulation/Gait assistance: Min assist Ambulation Distance (Feet): 20 Feet (+ 15') Assistive device: 1 person hand held assist Gait Pattern/deviations: Step-through pattern;Wide base of support;Decreased stride length;Decreased  step length - right;Decreased step length - left;Trunk flexed   Gait velocity interpretation: Below normal speed for age/gender General Gait Details: Pt with unsteady, slow gait using therapist and IV pole for support. Dyspnea present. Sa02 ranged 88-90% on RA.   Stairs            Wheelchair Mobility    Modified Rankin (Stroke Patients Only)       Balance Overall balance assessment: Needs assistance Sitting-balance support: Feet supported;No upper extremity supported Sitting balance-Leahy Scale: Fair     Standing balance support: During functional activity Standing balance-Leahy Scale: Poor Standing balance comment: Relient on external support for balance.                              Pertinent Vitals/Pain Pain Assessment: Faces Pain Score: 8  Pain Location: abdomen Pain Descriptors / Indicators: Sore;Aching Pain Intervention(s): Limited activity within patient's tolerance;Monitored during session;PCA encouraged;Repositioned    Home Living Family/patient expects to be discharged to:: Skilled nursing facility Living Arrangements: Alone                    Prior Function Level of Independence: Independent         Comments: Daughter concerned about pt being home alone. "She was wobbly on her feet before surgery and I travel for work."     Journalist, newspaper        Extremity/Trunk Assessment   Upper Extremity Assessment: Defer to OT evaluation           Lower Extremity Assessment: Generalized weakness         Communication   Communication: No difficulties  Cognition  Arousal/Alertness: Awake/alert Behavior During Therapy: WFL for tasks assessed/performed Overall Cognitive Status: Within Functional Limits for tasks assessed                      General Comments General comments (skin integrity, edema, etc.): Pt's daugther present during session.    Exercises General Exercises - Lower Extremity Ankle Circles/Pumps:  Both;15 reps;Seated Long Arc Quad: Both;10 reps;Seated      Assessment/Plan    PT Assessment Patient needs continued PT services  PT Diagnosis Difficulty walking;Acute pain   PT Problem List Decreased strength;Pain;Cardiopulmonary status limiting activity;Decreased activity tolerance;Decreased balance;Decreased mobility;Decreased knowledge of use of DME  PT Treatment Interventions Balance training;Gait training;Functional mobility training;Therapeutic activities;Therapeutic exercise;Patient/family education;DME instruction   PT Goals (Current goals can be found in the Care Plan section) Acute Rehab PT Goals Patient Stated Goal: none stated PT Goal Formulation: With patient/family Time For Goal Achievement: 10/20/14 Potential to Achieve Goals: Good    Frequency Min 3X/week   Barriers to discharge Decreased caregiver support Pt lives alone    Co-evaluation               End of Session Equipment Utilized During Treatment: Gait belt Activity Tolerance: Patient tolerated treatment well;Patient limited by pain Patient left: Other (comment);with family/visitor present;with nursing/sitter in room (in w/c ready to transfer to another unit) Nurse Communication: Mobility status         Time: 1212-1230 PT Time Calculation (min) (ACUTE ONLY): 18 min   Charges:   PT Evaluation $Initial PT Evaluation Tier I: 1 Procedure     PT G Codes:        Chea Malan A Sabriya Yono 10/06/2014, 1:31 PM Wray Kearns, [place], DPT [phone]

## 2014-10-06 NOTE — Progress Notes (Signed)
Removed foley per MD orders.

## 2014-10-06 NOTE — Care Management Note (Addendum)
Case Management Note  Patient Details  Name: Alexa Key MRN: [id number] Date of Birth: Dec 02, 1950  Subjective/Objective:         Pt from home alone with PMH significant for tobacco abuse, COPD HTN, CKD stage III, and rectovaginal fistula presenting with acute abdominal pain and nausea.    Action/Plan: Return to home when medically stable. CM to f/u with d/c needs.  Expected Discharge Date:                  Expected Discharge Plan:  [name]  In-House Referral:  Clinical Social Work  Discharge planning Services  CM Consult  Post Acute Care Choice:    Choice offered to:     DME Arranged:    DME Agency:     HH Arranged:    [place] Agency:     Status of Service:  In process, will continue to follow  Medicare Important Message Given:  Yes Date Medicare IM Given:  10/06/14 Medicare IM give by:  Pearla Dubonnet RN Date Additional Medicare IM Given:    Additional Medicare Important Message give by:     If discussed at [place] of Stay Meetings, dates discussed:    Additional Comments: S/P sigmoid colon colectomy with in colostomy takedown of colovaginal fistula 10/02/2014.   Whitman Hero [place], RN 10/06/2014, 10:47 AM

## 2014-10-06 NOTE — Consult Note (Signed)
WOC ostomy consult note Stoma type/location: LLQ COlostomy Stomal assessment/size: pink and viable.  Some stool noted in pouch.  Peristomal assessment: Not assessed this visit.  Patient refused.  Treatment options for stomal/peristomal skin: Not assessed today.  Supplies are in the room. 2 3/4" pouches and barrier rings  Output Small amount soft brown stool. Ostomy pouching: 2pc. 2 3/4"   Education provided: Daughter at bedside.  Patient has been transferred from ICU today and states she is too sore to worry about the pouch change.  Would like a visit tomorrow. Daughter agrees that waiting is best for her mother.  Patient uses PCA while [place] RN in room.  Enrolled patient in [place] program: No *Supplies in room.  WIll see in the morning.   [place] team will continue to follow.  Domenic Moras RN BSN [place] Pager [phone]

## 2014-10-06 NOTE — Progress Notes (Signed)
Medicare Important Message given? YES (If response is "NO", the following Medicare IM given date fields will be blank) Date Medicare IM given:10/06/14 Medicare IM given by: Whitman Hero

## 2014-10-06 NOTE — Progress Notes (Signed)
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 Brantley Stage 10/02/2014. Patient with abdominal pain around incision site which is slowly improving. Colostomy with stool. Patient was also to have a cardiac catheterization, however due to worsening renal function this was canceled. Per general surgery.  #2 coronary artery disease with intermediate risk Myoview Patient denies any chest or no shortness of breath. Patient had an abnormal Myoview and was subsequently scheduled for cardiac catheterization which was to be done, however due to worsening renal function cardiac catheterization has been canceled. Patient will likely need to follow-up with cardiology as outpatient.   #3 acute on chronic kidney disease III ( baseline creatinine 1.2-1.5) Questionable etiology. Differential includes prerenal versus renal secondary to ATN. Renal function slowly trending down currently at 1.37 from 1.68 from 2.13 from 2.36 from 2.60 from 2.59. Patient with recent CT scan done with contrast. Patient also with small bowel obstruction with several episodes of emesis and nausea with poor oral intake. FENa = 0.2%. Renal ultrasound negative for hydronephrosis. Normal saline lock IV fluids. Follow.  #4 hypertension Stable. Increase IV Lopressor 25 mg IV every 8 hours.  #5 COPD Stable. Continue Symbicort and spiriva  #6 colovaginal fistula Patient status post sigmoid colon colectomy with end colostomy takedown of colovaginal fistula 10/02/2014 per Dr. Brantley Stage. Per general surgery.  #7 Ecoli UTI Patient with improvement with dysuria. Urine cultures with greater than 100,000 Escherichia coli. Continue IV Rocephin day 3/7. D/C Foley  catheter.  #8 abnormal chest x-ray Chest x-ray with concerns for possible pneumonia. Patient afebrile. Patient does have a leukocytosis which is trending down. Patient with no respiratory symptoms. Patient denies any shortness of breath. Patient denies any cough. Patient on IV Rocephin. Patient desats off oxygen which is improving, was not on oxygen at home. Repeat CXR with improving atelectasis and mild interstitial edema pattern. Normal saline lock IV fluids. Continue IV Rocephin, IS, flutter valve. Patient with autodiuresis and diuresed 1.6 L yesterday. Follow.  #9 leukocytosis Likely secondary to problem #7 and reactive secondary to recent surgery. Patient has been pancultures and cultures are pending. Urine cultures with Escherichia coli. Blood cultures with no growth to date. Patient currently on IV Rocephin D3/7. Follow for now.  #10 SVT Patient noted to have SVT overnight. Patient asymptomatic. Patient's potassium is at 3.9. Magnesium at 2.2. Increase IV Lopressor 25 mg IV every 8 hours.  #11 tobacco abuse Nicotine patch. Tobacco cessation.  #12 prophylaxis PPI for GI prophylaxis. Lovenox for DVT prophylaxis.    Code Status: Full Family Communication: Updated patient. No family at bedside. Disposition Plan: Transfer to telemetry.   Consultants:  Gen. surgery: Dr. Brantley Stage 09/30/2014  Cardiology: Dr. Wilhemina Cash 09/30/2014  Procedures:  CT abdomen and pelvis 09/29/2014  Renal ultrasound 10/01/2014  Chest x-ray 10/03/2014, 10/05/14 Sigmoid colon colectomy with in colostomy takedown of colovaginal fistula per Dr. Brantley Stage 10/02/2014  Antibiotics:  IV Levaquin 10/01/2014 >>> 10/03/2014  IV Rocephin 10/03/2014  HPI/Subjective: Patient states breathing okay. No CP. Still with abdominal pain. Tolerating clears. Per nursing patient with a small burst of SVT last night patient was asymptomatic.  Objective: Filed Vitals:   10/06/14 0800  BP:   Pulse:   Temp:   Resp: 21     Intake/Output Summary (Last 24 hours) at 10/06/14 0957  Last data filed at 10/06/14 0700  Gross per 24 hour  Intake 1214.58 ml  Output   1425 ml  Net -210.42 ml   Filed Weights   09/29/14 1900 09/30/14 0534 10/01/14 0602  Weight: 65.409 kg (144 lb 3.2 oz) 66.497 kg (146 lb 9.6 oz) 66.679 kg (147 lb)    Exam:   General:  NAD. NGT in place. Sitting in chair.  Cardiovascular: RRR  Respiratory: Mild bibasilar crackles o/w clear  Abdomen: Soft, decreasing tenderness to palpation in mid abdomen and around incision site. some bowel sounds, Colostomy with stool.  Musculoskeletal: No clubbing cyanosis or edema.  Data Reviewed: Basic Metabolic Panel:  Recent Labs Lab 10/02/14 0357 10/03/14 0309 10/04/14 0325 10/05/14 0303 10/06/14 0309  NA 141 145 149* 147* 145  K 4.0 4.1 4.0 4.0 3.9  CL 108 117* 114* 109 109  CO2 23 18* 25 27 27   GLUCOSE 84 103* 127* 126* 111*  BUN 48* 46* 46* 33* 23*  CREATININE 2.60* 2.36* 2.13* 1.68* 1.37*  CALCIUM 7.3* 6.7* 7.5* 8.0* 8.2*  MG  --  1.8 2.2 2.2  --   PHOS  --  5.4*  --   --   --    Liver Function Tests:  Recent Labs Lab 09/29/14 1107 10/03/14 0309  AST 22  --   ALT 18  --   ALKPHOS 77  --   BILITOT 0.3  --   PROT 7.2  --   ALBUMIN 3.7 2.2*    Recent Labs Lab 09/29/14 1108  LIPASE 29   No results for input(s): AMMONIA in the last 168 hours. CBC:  Recent Labs Lab 09/29/14 1107  10/02/14 0357 10/03/14 0309 10/04/14 0325 10/05/14 0303 10/06/14 0309  WBC 13.0*  < > 9.8 15.1* 14.7* 15.3* 14.3*  NEUTROABS 10.3*  --   --  12.8* 12.1*  --   --   HGB 12.5  < > 10.7* 9.2* 8.9* 8.5* 8.5*  HCT 40.1  < > 33.6* 29.2* 28.7* 28.3* 27.5*  MCV 96.4  < > 95.7 98.6 98.3 99.6 98.9  PLT 283  < > 243 245 222 211 218  < > = values in this interval not displayed. Cardiac Enzymes:  Recent Labs Lab 09/29/14 1108  TROPONINI 0.03   BNP (last 3 results) No results for input(s): BNP in the last 8760 hours.  ProBNP (last 3  results) No results for input(s): PROBNP in the last 8760 hours.  CBG:  Recent Labs Lab 10/01/14 0404 10/01/14 2132 10/02/14 0558 10/02/14 0817 10/02/14 1315  GLUCAP 107* 90 82 74 159*    Recent Results (from the past 240 hour(s))  Culture, blood (routine x 2)     Status: None   Collection Time: 09/30/14 12:32 PM  Result Value Ref Range Status   Specimen Description BLOOD LEFT HAND  Final   Special Requests BOTTLES DRAWN AEROBIC ONLY 10CC  Final   Culture   Final    NO GROWTH 5 DAYS Performed at [facility]    Report Status 10/06/2014 FINAL  Final  Culture, blood (routine x 2)     Status: None   Collection Time: 09/30/14 12:41 PM  Result Value Ref Range Status   Specimen Description BLOOD RIGHT HAND  Final   Special Requests BOTTLES DRAWN AEROBIC ONLY [place]  Final   Culture   Final    NO GROWTH 5 DAYS Performed at [facility]    Report Status 10/06/2014 FINAL  Final  Culture, Urine     Status: None   Collection Time: 09/30/14  5:47 PM  Result Value Ref Range Status   Specimen Description URINE, RANDOM  Final   Special Requests NONE  Final   Colony Count   Final    >=100,000 COLONIES/ML Performed at [facility]    Culture   Final    Multiple bacterial morphotypes present, none predominant. Suggest appropriate recollection if clinically indicated. Performed at [facility]    Report Status 10/01/2014 FINAL  Final  Culture, Urine     Status: None   Collection Time: 10/01/14  2:31 PM  Result Value Ref Range Status   Specimen Description URINE, RANDOM  Final   Special Requests NONE  Final   Colony Count   Final    >=100,000 COLONIES/ML Performed at [facility]    Culture   Final    ESCHERICHIA COLI Performed at [facility]    Report Status 10/03/2014 FINAL  Final   Organism ID, Bacteria ESCHERICHIA COLI  Final      Susceptibility   Escherichia coli - MIC*    AMPICILLIN >=32 RESISTANT Resistant      CEFAZOLIN 16 INTERMEDIATE Intermediate     CEFTRIAXONE <=1 SENSITIVE Sensitive     CIPROFLOXACIN >=4 RESISTANT Resistant     GENTAMICIN <=1 SENSITIVE Sensitive     LEVOFLOXACIN >=8 RESISTANT Resistant     NITROFURANTOIN <=16 SENSITIVE Sensitive     TOBRAMYCIN <=1 SENSITIVE Sensitive     TRIMETH/SULFA <=20 SENSITIVE Sensitive     PIP/TAZO <=4 SENSITIVE Sensitive     * ESCHERICHIA COLI  Surgical pcr screen     Status: None   Collection Time: 10/01/14 10:28 PM  Result Value Ref Range Status   MRSA, PCR NEGATIVE NEGATIVE Final   Staphylococcus aureus NEGATIVE NEGATIVE Final    Comment:        The Xpert SA Assay (FDA approved for NASAL specimens in patients over 8 years of age), is one component of a comprehensive surveillance program.  Test performance has been validated by [facility] for patients greater than or equal to 30 year old. It is not intended to diagnose infection nor to guide or monitor treatment.      Studies: Dg Chest Port 1 View  10/05/2014   CLINICAL DATA:  Hypoxia  EXAM: PORTABLE CHEST - 1 VIEW  COMPARISON:  Radiograph 10/03/2014  FINDINGS: NG tube extends the stomach. Stable mildly enlarged cardiac silhouette. There is interval improvement in basilar atelectasis seen on prior. Mild peripheral linear pattern remains.  IMPRESSION: 1. Improvement in right basilar atelectasis. 2. Mild interstitial edema pattern.   Electronically Signed   By: Suzy Bouchard M.D.   On: 10/05/2014 09:41    Scheduled Meds: . acetaminophen  1,000 mg Oral TID  . budesonide-formoterol  2 puff Inhalation BID  . cefTRIAXone (ROCEPHIN)  IV  1 g Intravenous Q24H  . enoxaparin (LOVENOX) injection  30 mg Subcutaneous Q24H  . fentaNYL   Intravenous 6 times per day  . metoprolol  2.5 mg Intravenous 3 times per day  . nicotine  7 mg Transdermal Daily  . pantoprazole (PROTONIX) IV  40 mg Intravenous Q24H  . pneumococcal 23 valent vaccine  0.5 mL Intramuscular Tomorrow-1000  . tiotropium  18  mcg Inhalation Daily   Continuous Infusions: . sodium chloride 10 mL/hr at 10/02/14 1191    Principal Problem:   Colonic obstruction; Partial, secondary to benign sigmoid colonic stricture Active Problems:  Tobacco user   Rectovaginal fistula   Essential hypertension   COPD (chronic obstructive pulmonary disease)   Abdominal pain   CAD in native artery   CKD (chronic kidney disease) stage 3, GFR 30-59 ml/min   GERD (gastroesophageal reflux disease)   Bacteriuria   Abnormal nuclear stress test   Pre-operative cardiovascular examination, myocardial ischemia   ARF (acute renal failure)   Acute cystitis without hematuria   E-coli UTI   Leukocytosis   Hypoxia    Time spent: 61 minutes    Dorma Altman M.D. [facility] Pager [phone]. If 7PM-7AM, please contact night-coverage at [email or web address], password [facility] 10/06/2014, 9:57 AM  LOS: 7 days

## 2014-10-06 NOTE — Progress Notes (Signed)
BP 193/79, HR 76. NAD. MD notified.

## 2014-10-07 ENCOUNTER — Inpatient Hospital Stay (HOSPITAL_COMMUNITY): Payer: Commercial Managed Care - HMO

## 2014-10-07 ENCOUNTER — Encounter (HOSPITAL_COMMUNITY): Payer: Self-pay | Admitting: Surgery

## 2014-10-07 LAB — BASIC METABOLIC PANEL
ANION GAP: 10 (ref 5–15)
BUN: 17 mg/dL (ref 6–20)
CALCIUM: 8.3 mg/dL — AB (ref 8.9–10.3)
CHLORIDE: 107 mmol/L (ref 101–111)
CO2: 28 mmol/L (ref 22–32)
CREATININE: 1.33 mg/dL — AB (ref 0.44–1.00)
GFR calc non Af Amer: 41 mL/min — ABNORMAL LOW (ref >60)
GFR, EST AFRICAN AMERICAN: 47 mL/min — AB (ref >60)
Glucose, Bld: 75 mg/dL (ref 65–99)
Potassium: 3.4 mmol/L — ABNORMAL LOW (ref 3.5–5.1)
Sodium: 145 mmol/L (ref 135–145)

## 2014-10-07 LAB — CBC
HEMATOCRIT: 29.7 % — AB (ref 36.0–46.0)
Hemoglobin: 9.1 g/dL — ABNORMAL LOW (ref 12.0–15.0)
MCH: 30.1 pg (ref 26.0–34.0)
MCHC: 30.6 g/dL (ref 30.0–36.0)
MCV: 98.3 fL (ref 78.0–100.0)
PLATELETS: 203 10*3/uL (ref 150–400)
RBC: 3.02 MIL/uL — AB (ref 3.87–5.11)
RDW: 15 % (ref 11.5–15.5)
WBC: 11.5 10*3/uL — AB (ref 4.0–10.5)

## 2014-10-07 LAB — MAGNESIUM: MAGNESIUM: 2 mg/dL (ref 1.7–2.4)

## 2014-10-07 MED ORDER — LOSARTAN POTASSIUM 50 MG PO TABS
100.0000 mg | ORAL_TABLET | Freq: Every day | ORAL | Status: DC
  Administered 2014-10-07 – 2014-10-13 (×7): 100 mg via ORAL
  Filled 2014-10-07 (×7): qty 2

## 2014-10-07 MED ORDER — ALUM & MAG HYDROXIDE-SIMETH 200-200-20 MG/5ML PO SUSP
30.0000 mL | Freq: Four times a day (QID) | ORAL | Status: DC | PRN
  Administered 2014-10-09 – 2014-10-13 (×6): 30 mL via ORAL
  Filled 2014-10-07 (×6): qty 30

## 2014-10-07 MED ORDER — CEFUROXIME AXETIL 500 MG PO TABS
500.0000 mg | ORAL_TABLET | Freq: Two times a day (BID) | ORAL | Status: DC
  Administered 2014-10-07 – 2014-10-11 (×8): 500 mg via ORAL
  Filled 2014-10-07 (×9): qty 1

## 2014-10-07 MED ORDER — LACTATED RINGERS IV BOLUS (SEPSIS): 1000.0000 mL | Freq: Three times a day (TID) | INTRAVENOUS | Status: AC | PRN

## 2014-10-07 MED ORDER — PANTOPRAZOLE SODIUM 40 MG PO TBEC
40.0000 mg | DELAYED_RELEASE_TABLET | Freq: Every day | ORAL | Status: DC
  Administered 2014-10-07 – 2014-10-21 (×15): 40 mg via ORAL
  Filled 2014-10-07 (×15): qty 1

## 2014-10-07 MED ORDER — POTASSIUM CHLORIDE CRYS ER 20 MEQ PO TBCR
40.0000 meq | EXTENDED_RELEASE_TABLET | Freq: Once | ORAL | Status: AC
  Administered 2014-10-07: 40 meq via ORAL
  Filled 2014-10-07: qty 2

## 2014-10-07 MED ORDER — METOPROLOL TARTRATE 1 MG/ML IV SOLN
5.0000 mg | Freq: Four times a day (QID) | INTRAVENOUS | Status: DC | PRN
  Filled 2014-10-07: qty 5

## 2014-10-07 MED ORDER — BLISTEX MEDICATED EX OINT
1.0000 "application " | TOPICAL_OINTMENT | Freq: Two times a day (BID) | CUTANEOUS | Status: DC
  Administered 2014-10-07 – 2014-10-21 (×24): 1 via TOPICAL
  Filled 2014-10-07 (×2): qty 10

## 2014-10-07 MED ORDER — METOPROLOL TARTRATE 12.5 MG HALF TABLET
12.5000 mg | ORAL_TABLET | Freq: Two times a day (BID) | ORAL | Status: DC
  Administered 2014-10-07 – 2014-10-21 (×25): 12.5 mg via ORAL
  Filled 2014-10-07 (×29): qty 1

## 2014-10-07 MED ORDER — LOSARTAN POTASSIUM 50 MG PO TABS: 100.0000 mg | ORAL_TABLET | Freq: Every day | ORAL | Status: DC

## 2014-10-07 MED ORDER — SACCHAROMYCES BOULARDII 250 MG PO CAPS
250.0000 mg | ORAL_CAPSULE | Freq: Two times a day (BID) | ORAL | Status: DC
  Administered 2014-10-07 – 2014-10-21 (×27): 250 mg via ORAL
  Filled 2014-10-07 (×35): qty 1

## 2014-10-07 MED ORDER — MAGIC MOUTHWASH
15.0000 mL | Freq: Four times a day (QID) | ORAL | Status: DC | PRN
  Filled 2014-10-07: qty 15

## 2014-10-07 MED ORDER — ACETAMINOPHEN 650 MG RE SUPP
650.0000 mg | RECTAL | Status: DC | PRN
  Filled 2014-10-07: qty 1

## 2014-10-07 MED ORDER — AMLODIPINE BESYLATE 10 MG PO TABS
10.0000 mg | ORAL_TABLET | Freq: Every day | ORAL | Status: DC
  Administered 2014-10-07 – 2014-10-21 (×15): 10 mg via ORAL
  Filled 2014-10-07 (×15): qty 1

## 2014-10-07 MED ORDER — METOPROLOL TARTRATE 25 MG PO TABS: 25.0000 mg | ORAL_TABLET | Freq: Every day | ORAL | Status: DC

## 2014-10-07 MED ORDER — METOPROLOL TARTRATE 12.5 MG HALF TABLET
12.5000 mg | ORAL_TABLET | Freq: Two times a day (BID) | ORAL | Status: DC | PRN
Start: 2014-10-07 — End: 2014-10-07

## 2014-10-07 MED ORDER — ASPIRIN 325 MG PO TABS
325.0000 mg | ORAL_TABLET | Freq: Every day | ORAL | Status: DC
  Administered 2014-10-07 – 2014-10-15 (×9): 325 mg via ORAL
  Filled 2014-10-07 (×9): qty 1

## 2014-10-07 NOTE — Care Management (Signed)
UR updated.  

## 2014-10-07 NOTE — Progress Notes (Signed)
Occupational Therapy Evaluation Patient Details Name: Alexa Key MRN: [id number] DOB: October 07, 1950 Today's Date: 10/07/2014    History of Present Illness Patient is a 64 y/o female s/p exploratory laparotomy with sigmoid colectomy, colovaginal fistula takedown and colostomy 6/2. PMH includes CAD,PVD, CVA, HTN,COPD, tobacco use, fibromyalgia and DM.   Clinical Impression   PTA, pt independent with ADL and mobility. Pt will benefit from rehab at SNF to facilitate safe return home. Will follow acutely to address established goals.     Follow Up Recommendations  SNF;Supervision/Assistance - 24 hour    Equipment Recommendations  3 in 1 bedside comode    Recommendations for Other Services       Precautions / Restrictions Precautions Precautions: Fall      Mobility Bed Mobility Overal bed mobility: Needs Assistance Bed Mobility: Sidelying to Sit;Sit to Sidelying Rolling: Min assist Sidelying to sit: Min assist     Sit to sidelying: Min assist    Transfers Overall transfer level: Needs assistance   Transfers: Stand Pivot Transfers Sit to Stand: Min assist Stand pivot transfers: Min assist            Balance           Standing balance support: During functional activity Standing balance-Leahy Scale: Fair Standing balance comment: able to stand to clean self after toileting                             ADL Overall ADL's : Needs assistance/impaired     Grooming: Set up   Upper Body Bathing: Set up   Lower Body Bathing: Minimal assistance;Sit to/from stand   Upper Body Dressing : Set up   Lower Body Dressing: Minimal assistance;Sit to/from stand   Toilet Transfer: Minimal assistance;Stand-pivot   Toileting- Water quality scientist and Hygiene: Min guard       Functional mobility during ADLs: Minimal assistance General ADL Comments: limited by fatigue and pain     Vision     Perception     Praxis      Pertinent Vitals/Pain Pain  Assessment: Faces Faces Pain Scale: Hurts even more Pain Location: abdomen Pain Descriptors / Indicators: Discomfort;Grimacing Pain Intervention(s): Limited activity within patient's tolerance;Monitored during session;Repositioned     Hand Dominance     Extremity/Trunk Assessment Upper Extremity Assessment Upper Extremity Assessment: Overall WFL for tasks assessed   Lower Extremity Assessment Lower Extremity Assessment: Defer to PT evaluation   Cervical / Trunk Assessment Cervical / Trunk Assessment: Other exceptions (guards due to abdominal surgery)   Communication Communication Communication: No difficulties   Cognition Arousal/Alertness: Awake/alert Behavior During Therapy: WFL for tasks assessed/performed Overall Cognitive Status: Within Functional Limits for tasks assessed                     General Comments       Exercises       Shoulder Instructions      Home Living Family/patient expects to be discharged to:: Skilled nursing facility Living Arrangements: Alone                                      Prior Functioning/Environment Level of Independence: Independent             OT Diagnosis: Generalized weakness;Acute pain   OT Problem List: Decreased strength;Decreased range of motion;Decreased activity tolerance;Impaired balance (sitting and/or standing);Decreased  knowledge of use of DME or AE;Decreased knowledge of precautions;Cardiopulmonary status limiting activity;Obesity;Pain   OT Treatment/Interventions: Self-care/ADL training;Therapeutic exercise;Energy conservation;DME and/or AE instruction;Therapeutic activities;Patient/family education    OT Goals(Current goals can be found in the care plan section) Acute Rehab OT Goals Patient Stated Goal: to live independently again OT Goal Formulation: With patient Time For Goal Achievement: 10/21/14 Potential to Achieve Goals: Good  OT Frequency: Min 2X/week   Barriers to D/C:  Decreased caregiver support          Co-evaluation              End of Session Equipment Utilized During Treatment: Oxygen Nurse Communication: Mobility status  Activity Tolerance: Patient tolerated treatment well Patient left: in bed;with call bell/phone within reach;with family/visitor present   Time: 6010-9323 OT Time Calculation (min): 25 min Charges:  OT General Charges $OT Visit: 1 Procedure OT Evaluation $Initial OT Evaluation Tier I: 1 Procedure OT Treatments $Self Care/Home Management : 8-22 mins G-Codes:    Aniayah Alaniz,HILLARY 2014-10-19, 4:43 PM   [facility], OTR/L  [phone] Oct 19, 2014

## 2014-10-07 NOTE — Clinical Social Work Note (Signed)
Clinical Social Work Assessment  Patient Details  Name: Alexa Key MRN: [id number] Date of Birth: 1951/02/07  Date of referral:  10/07/14               Reason for consult:  Facility Placement                Permission sought to share information with:  Family Supports Permission granted to share information::  Yes, Verbal Permission Granted  Name::     Patient's daughter Roizy Harold::  SNF admissions  Relationship::     Contact Information:     Housing/Transportation Living arrangements for the past 2 months:  [place] of Information:  Patient, Adult Children Patient Interpreter Needed:  None Criminal Activity/Legal Involvement Pertinent to Current Situation/Hospitalization:  No - Comment as needed Significant Relationships:  Adult Children Lives with:    Do you feel safe going back to the place where you live?  Yes Need for family participation in patient care:  Yes (Comment) (Patient requested to discuss SNF options with patient's daughter.)  Care giving concerns: Patient and daughter agree that patient needs some short term rehab for strengthening in order for her to return home safely.   Social Worker assessment / plan:  CSW met with patient to discuss SNF placement and role of CSW.  Patient is a 64 year old female who is alert and oriented x4.  Patient states she has not been to short term rehab at a SNF before.  CSW explained what happens at SNF and the benefits of SNF vs home health.  Patient questioned about her social security if it would be affected, CSW explained to her that no it would not be affected unless she became a long term care resident.  Patient stated that made her feel better, and she is in agreement to going to SNF for short term rehab.  Patient was asked if she had any other questions she did not but requested CSW to speak to her daughter.  CSW contacted patient's daughter and explained process for SNF placement and daughter agreed that  patient would benefit from short term rehab.  CSW will continue process of finding placement for patient.  Employment status:  Retired Nurse, adult PT Recommendations:  [place] / Referral to community resources:     Patient/Family's Response to care:  Patient and family agreeable to SNF placement.  Patient/Family's Understanding of and Emotional Response to Diagnosis, Current Treatment, and Prognosis:  Patient and daughter did not have any questions regarding her diagnosis, and does not have any other questions about treatment.  Emotional Assessment Appearance:  Appears older than stated age Attitude/Demeanor/Rapport:    Affect (typically observed):  Accepting, Pleasant, Stable, Calm Orientation:  Oriented to Self, Oriented to Place, Oriented to  Time, Oriented to Situation Alcohol / Substance use:  Tobacco Use Psych involvement (Current and /or in the community):  No (Comment)  Discharge Needs  Concerns to be addressed:  No discharge needs identified Readmission within the last 30 days:  No Current discharge risk:  None Barriers to Discharge:  No Barriers Identified   Ross Ludwig, LCSWA 10/07/2014, 4:28 PM

## 2014-10-07 NOTE — Progress Notes (Signed)
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 Alexa Key had decreased appetite. Alexa Key was scheduled for cardiac catheterization on 10/03/2014 for medical clearance for surgical intervention of fistula or stricture. Alexa Key was admitted and general surgery consulted as Alexa Key had presented with a colonic obstruction. Cardiology was consulted and Alexa Key was scheduled for cardiac catheterization. On the day of cardiac catheterization Alexa Key was noted to be in acute renal failure with a creatinine going up as high as 2.59. Cardiac catheterization was canceled. Alexa Key and family accepted the risk for surgery and Alexa Key subsequently underwent a sigmoid colon colectomy with end colostomy takedown of colovaginal fistula. Alexa Key was placed in the step down unit initially. Alexa Key was noted to have a significant leukocytosis. Alexa Key was pancultured and noted to have a Escherichia coli UTI. Alexa Key was initially placed empirically on IV Levaquin however urine cultures were resistant to fluoroquinolones and antibiotics were switched to IV Rocephin. Alexa Key improved clinically has been started on clear liquids per general surgery and antibiotics changed to oral Ceftin.  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 Brantley Stage 10/02/2014. Alexa Key with abdominal pain around incision site which is slowly improving. Colostomy with stool. Alexa Key was also to have a cardiac catheterization, however due to worsening  renal function this was canceled. Per general surgery.  #2 coronary artery disease with intermediate risk Myoview Alexa Key denies any chest or no shortness of breath. Alexa Key had an abnormal Myoview and was subsequently scheduled for cardiac catheterization which was to be done, however due to worsening renal function cardiac catheterization has been canceled. Alexa Key will likely need to follow-up with cardiology as outpatient.   #3 acute on chronic kidney disease III ( baseline creatinine 1.2-1.5) Questionable etiology. Differential includes prerenal versus renal secondary to ATN. Renal function slowly trending down currently at 1.33 from 1.37 from 1.68 from 2.13 from 2.36 from 2.60 from 2.59. Alexa Key with recent CT scan done with contrast. Alexa Key also with small bowel obstruction with several episodes of emesis and nausea with poor oral intake. FENa = 0.2%. Renal ultrasound negative for hydronephrosis. Normal saline lock IV fluids. Follow.  #4 hypertension Stable. Change IV beta blocker to oral beta blocker. Place Alexa Key back on home dose ARB. Hydralazine as needed. I  #5 COPD Stable. Continue Symbicort and spiriva  #6 colovaginal fistula Alexa Key status post sigmoid colon colectomy with end colostomy takedown of colovaginal fistula 10/02/2014 per Dr. Brantley Stage. Per general surgery.  #7 Ecoli UTI Alexa Key with improvement with dysuria. Urine cultures with greater than 100,000 Escherichia coli. Change IV Rocephin to oral Ceftin antibiotics day 4/7.  #8 abnormal chest x-ray Chest x-ray with concerns for possible pneumonia. Alexa Key afebrile. Alexa Key does have a leukocytosis which is trending down. Alexa Key with no respiratory symptoms. Alexa Key denies any shortness of breath. Alexa Key denies any cough. Alexa Key on IV Rocephin. Alexa Key desats off oxygen which is improving, was not on oxygen at home. Repeat CXR with improving atelectasis and mild interstitial edema pattern. Normal saline lock IV fluids.  Change IV antibiotics to oral antibiotics. Continue IS, flutter valve. Alexa Key with autodiuresis and diuresed 1.6 L 2 days  ago. Follow.  #9 leukocytosis Likely secondary to problem #7 and reactive secondary to recent surgery. Alexa Key has been pancultures and cultures are pending. Urine cultures with Escherichia coli. Blood cultures with no growth to date. Alexa Key currently on IV Rocephin D4/7. Change Rocephin to oral Ceftin. Follow for now.  #10 SVT Alexa Key noted to have SVT 2 nights ago. No further SVT. Alexa Key asymptomatic. Alexa Key's potassium is at 3.4. Magnesium at 2.0. Replete potassium. Change IV beta blocker to oral beta blocker.   #11 tobacco abuse Nicotine patch. Tobacco cessation.  #12 prophylaxis PPI for GI prophylaxis. Lovenox for DVT prophylaxis.    Code Status: Full Family Communication: Updated Alexa Key. No family at bedside. Disposition Plan: Remain inpatient. Per general surgery.   Consultants:  Gen. surgery: Dr. Brantley Stage 09/30/2014  Cardiology: Dr. Ellyn Hack 09/30/2014  Procedures:  CT abdomen and pelvis 09/29/2014  Renal ultrasound 10/01/2014  Chest x-ray 10/03/2014, 10/05/14 Sigmoid colon colectomy with in colostomy takedown of colovaginal fistula per Dr. Brantley Stage 10/02/2014  Antibiotics:  IV Levaquin 10/01/2014 >>> 10/03/2014  IV Rocephin 10/03/2014>>>>> 10/07/2014  Oral Ceftin 10/07/2014  HPI/Subjective: Alexa Key tolerating current diet. No SOB. No chest pain. Alexa Key still with some abdominal pain.   Objective: Filed Vitals:   10/07/14 1137  BP:   Pulse:   Temp:   Resp: 18    Intake/Output Summary (Last 24 hours) at 10/07/14 1151 Last data filed at 10/07/14 0955  Gross per 24 hour  Intake    600 ml  Output    450 ml  Net    150 ml   Filed Weights   09/29/14 1900 09/30/14 0534 10/01/14 0602  Weight: 65.409 kg (144 lb 3.2 oz) 66.497 kg (146 lb 9.6 oz) 66.679 kg (147 lb)    Exam:   General:  NAD.   Cardiovascular: RRR  Respiratory:  CTAB anterior lung fields  Abdomen: Soft, decreasing tenderness to palpation in mid abdomen and around incision site. some bowel sounds, Colostomy with stool.  Musculoskeletal: No clubbing cyanosis or edema.  Data Reviewed: Basic Metabolic Panel:  Recent Labs Lab 10/03/14 0309 10/04/14 0325 10/05/14 0303 10/06/14 0309 10/07/14 0526  NA 145 149* 147* 145 145  K 4.1 4.0 4.0 3.9 3.4*  CL 117* 114* 109 109 107  CO2 18* 25 27 27 28   GLUCOSE 103* 127* 126* 111* 75  BUN 46* 46* 33* 23* 17  CREATININE 2.36* 2.13* 1.68* 1.37* 1.33*  CALCIUM 6.7* 7.5* 8.0* 8.2* 8.3*  MG 1.8 2.2 2.2  --  2.0  PHOS 5.4*  --   --   --   --    Liver Function Tests:  Recent Labs Lab 10/03/14 0309  ALBUMIN 2.2*   No results for input(s): LIPASE, AMYLASE in the last 168 hours. No results for input(s): AMMONIA in the last 168 hours. CBC:  Recent Labs Lab 10/03/14 0309 10/04/14 0325 10/05/14 0303 10/06/14 0309 10/07/14 0526  WBC 15.1* 14.7* 15.3* 14.3* 11.5*  NEUTROABS 12.8* 12.1*  --   --   --   HGB 9.2* 8.9* 8.5* 8.5* 9.1*  HCT 29.2* 28.7* 28.3* 27.5* 29.7*  MCV 98.6 98.3 99.6 98.9 98.3  PLT 245 222 211 218 203   Cardiac Enzymes: No results for input(s): CKTOTAL, CKMB, CKMBINDEX, TROPONINI in the last 168 hours. BNP (last 3 results) No results for input(s): BNP in the last 8760 hours.  ProBNP (last 3 results) No results for input(s): PROBNP in the last 8760 hours.  CBG:  Recent Labs Lab 10/01/14 0404 10/01/14  2132 10/02/14 0558 10/02/14 0817 10/02/14 1315  GLUCAP 107* 90 82 74 159*    Recent Results (from the past 240 hour(s))  Culture, blood (routine x 2)     Status: None   Collection Time: 09/30/14 12:32 PM  Result Value Ref Range Status   Specimen Description BLOOD LEFT HAND  Final   Special Requests BOTTLES DRAWN AEROBIC ONLY 10CC  Final   Culture   Final    NO GROWTH 5 DAYS Performed at [facility]    Report Status 10/06/2014 FINAL  Final  Culture, blood  (routine x 2)     Status: None   Collection Time: 09/30/14 12:41 PM  Result Value Ref Range Status   Specimen Description BLOOD RIGHT HAND  Final   Special Requests BOTTLES DRAWN AEROBIC ONLY [place]  Final   Culture   Final    NO GROWTH 5 DAYS Performed at [facility]    Report Status 10/06/2014 FINAL  Final  Culture, Urine     Status: None   Collection Time: 09/30/14  5:47 PM  Result Value Ref Range Status   Specimen Description URINE, RANDOM  Final   Special Requests NONE  Final   Colony Count   Final    >=100,000 COLONIES/ML Performed at [facility]    Culture   Final    Multiple bacterial morphotypes present, none predominant. Suggest appropriate recollection if clinically indicated. Performed at [facility]    Report Status 10/01/2014 FINAL  Final  Culture, Urine     Status: None   Collection Time: 10/01/14  2:31 PM  Result Value Ref Range Status   Specimen Description URINE, RANDOM  Final   Special Requests NONE  Final   Colony Count   Final    >=100,000 COLONIES/ML Performed at [facility]    Culture   Final    ESCHERICHIA COLI Performed at [facility]    Report Status 10/03/2014 FINAL  Final   Organism ID, Bacteria ESCHERICHIA COLI  Final      Susceptibility   Escherichia coli - MIC*    AMPICILLIN >=32 RESISTANT Resistant     CEFAZOLIN 16 INTERMEDIATE Intermediate     CEFTRIAXONE <=1 SENSITIVE Sensitive     CIPROFLOXACIN >=4 RESISTANT Resistant     GENTAMICIN <=1 SENSITIVE Sensitive     LEVOFLOXACIN >=8 RESISTANT Resistant     NITROFURANTOIN <=16 SENSITIVE Sensitive     TOBRAMYCIN <=1 SENSITIVE Sensitive     TRIMETH/SULFA <=20 SENSITIVE Sensitive     PIP/TAZO <=4 SENSITIVE Sensitive     * ESCHERICHIA COLI  Surgical pcr screen     Status: None   Collection Time: 10/01/14 10:28 PM  Result Value Ref Range Status   MRSA, PCR NEGATIVE NEGATIVE Final   Staphylococcus aureus NEGATIVE NEGATIVE Final    Comment:         The Xpert SA Assay (FDA approved for NASAL specimens in patients over 92 years of age), is one component of a comprehensive surveillance program.  Test performance has been validated by [facility] for patients greater than or equal to 58 year old. It is not intended to diagnose infection nor to guide or monitor treatment.      Studies: No results found.  Scheduled Meds: . acetaminophen  1,000 mg Oral TID  . amLODipine  10 mg Oral Daily  . aspirin  325 mg Oral Daily  . budesonide-formoterol  2 puff Inhalation BID  . cefTRIAXone (ROCEPHIN)  IV  1 g Intravenous Q24H  . enalaprilat  0.625 mg Intravenous 4 times per day  . enoxaparin (LOVENOX) injection  40 mg Subcutaneous Daily  . fentaNYL   Intravenous 6 times per day  . lip balm  1 application Topical BID  . metoprolol tartrate  12.5 mg Oral BID  . nicotine  7 mg Transdermal Daily  . pantoprazole  40 mg Oral Daily  . pneumococcal 23 valent vaccine  0.5 mL Intramuscular Tomorrow-1000  . saccharomyces boulardii  250 mg Oral BID  . tiotropium  18 mcg Inhalation Daily   Continuous Infusions: . 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 [facility] Pager [phone]. If 7PM-7AM, please contact night-coverage at [email or web address], password [facility] 10/07/2014, 11:51 AM   LOS: 8 days

## 2014-10-07 NOTE — Progress Notes (Addendum)
Vomited about 400cc bile colored vomitus. Diet changed to NPO.Will monilor.Surgery  Made aware.

## 2014-10-07 NOTE — Progress Notes (Signed)
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   Essential hypertension   COPD (chronic obstructive pulmonary disease)   Abdominal pain   CAD in native artery   CKD (chronic kidney disease) stage 3, GFR 30-59 ml/min   GERD (gastroesophageal reflux disease)   Bacteriuria   Abnormal nuclear stress test   Pre-operative cardiovascular examination, myocardial ischemia   ARF (acute renal failure)   Acute cystitis without hematuria   E-coli UTI   Leukocytosis   Hypoxia   5 Days Post-Op  Procedure(s): EXPLORATORY LAPAROTOMY WITH PARTIAL COLECTOMY/COLOSTOMY  Assessment  - Large bowel obstruction secondary to sigmoid colon stricture and colovaginal fistula: POD #3 - exploratory laparotomy with sigmoid colectomy, colovaginal fistula takedown and colostomy - Dr. Brantley Stage  Continues to experience moderate abdominal pain around incision site - Stoma is still purple and viable - HTN - COPD - Ecoli UTI - Leukocytosis: Likely secondary to [facility] UTI and surgery.   Plan:  - POD #3 - exploratory laparotomy with sigmoid colectomy, colovaginal fistula takedown and colostomy - Dr. Brantley Stage  Tylenol IV added and changed to reduced fentanyl PCA yesterday - Stoma is still purple, but viable - continue to monitor - HTN: Continue IV Lopressor - COPD: Continue on home regimen of Symbicort. Spiriva added per internal medicine. Lucianne Muss UTI: Dysuria improving. Continue on IV Rocephin. - Leukocytosis: Continue to monitor, cultures pending. - VTE prophylaxis: SCDs, continue on Lovenox. - Mobilize as tolerated to help recovery  [facility] [phone]  10/07/2014  Subjective:  Doing OK today. Was able to eat jello and had some apple juice yesterday. Had some nausea yesterday and vomited 1x. No nausea or vomiting this AM. Got up to chair yesterday. Soreness over incision sites. Decreased energy.  Objective:  Vital signs:  Filed Vitals:   10/07/14 0004 10/07/14 0108 10/07/14 0546 10/07/14 0629  BP: 153/55  168/90 160/68  Pulse: 67  78 71  Temp: 98.4 F (36.9 C)  98.3 F (36.8 C)   TempSrc: Oral  Oral   Resp: '19 14 18   ' Height:      Weight:      SpO2: 98% 97% 99%     Last BM Date: 10/06/14  Intake/Output   Yesterday:  06/06 0701 - 06/07 0700 In: 480 [P.O.:480] Out: 700 [Urine:400; Stool:300] This shift:     Bowel function:  Flatus: None  BM: Colostomy with dark liquid output.  Drain: N/A  Physical Exam:  General: Pt awake/alert/oriented x4 in no acute distress. Laying supine in bed. Eyes: PERRL, normal EOM.  Sclera clear.  No icterus Neuro: CN II-XII intact w/o focal sensory/motor deficits. Lymph: No head/neck/groin lymphadenopathy Psych:  No delerium/psychosis/paranoia HENT: Normocephalic, Mucus membranes moist.  No thrush Neck: Supple, No tracheal deviation Chest: CTAB. No chest wall pain w good excursion CV:  Pulses intact. RRR. MS: Normal AROM mjr joints.  No obvious deformity Abdomen: Soft.  Nondistended. Mildly tender at incisions and in mid-abdomen.  No evidence of peritonitis.  No incarcerated hernias. Hypoactive bowel sounds. Ext:  SCDs BLE.  No mjr edema.  No cyanosis or clubbing. Skin: No petechiae / purpura  Results:   Labs: Results for orders placed  or performed during the hospital encounter of 09/29/14 (from the past 48 hour(s))  CBC     Status: Abnormal   Collection Time: 10/06/14  3:09 AM  Result Value Ref Range   WBC 14.3 (H) 4.0 - 10.5 K/uL   RBC 2.78 (L) 3.87 - 5.11 MIL/uL   Hemoglobin 8.5 (L) 12.0 - 15.0 g/dL   HCT 27.5 (L) 36.0 - 46.0 %   MCV 98.9  78.0 - 100.0 fL   MCH 30.6 26.0 - 34.0 pg   MCHC 30.9 30.0 - 36.0 g/dL   RDW 14.9 11.5 - 15.5 %   Platelets 218 150 - 400 K/uL  Basic metabolic panel     Status: Abnormal   Collection Time: 10/06/14  3:09 AM  Result Value Ref Range   Sodium 145 135 - 145 mmol/L   Potassium 3.9 3.5 - 5.1 mmol/L   Chloride 109 101 - 111 mmol/L   CO2 27 22 - 32 mmol/L   Glucose, Bld 111 (H) 65 - 99 mg/dL   BUN 23 (H) 6 - 20 mg/dL   Creatinine, Ser 1.37 (H) 0.44 - 1.00 mg/dL   Calcium 8.2 (L) 8.9 - 10.3 mg/dL   GFR calc non Af Amer 39 (L) >60 mL/min   GFR calc Af Amer 45 (L) >60 mL/min    Comment: (NOTE) The eGFR has been calculated using the CKD EPI equation. This calculation has not been validated in all clinical situations. eGFR's persistently <60 mL/min signify possible Chronic Kidney Disease.    Anion gap 9 5 - 15  Basic metabolic panel     Status: Abnormal   Collection Time: 10/07/14  5:26 AM  Result Value Ref Range   Sodium 145 135 - 145 mmol/L   Potassium 3.4 (L) 3.5 - 5.1 mmol/L   Chloride 107 101 - 111 mmol/L   CO2 28 22 - 32 mmol/L   Glucose, Bld 75 65 - 99 mg/dL   BUN 17 6 - 20 mg/dL   Creatinine, Ser 1.33 (H) 0.44 - 1.00 mg/dL   Calcium 8.3 (L) 8.9 - 10.3 mg/dL   GFR calc non Af Amer 41 (L) >60 mL/min   GFR calc Af Amer 47 (L) >60 mL/min    Comment: (NOTE) The eGFR has been calculated using the CKD EPI equation. This calculation has not been validated in all clinical situations. eGFR's persistently <60 mL/min signify possible Chronic Kidney Disease.    Anion gap 10 5 - 15  CBC     Status: Abnormal   Collection Time: 10/07/14  5:26 AM  Result Value Ref Range   WBC 11.5 (H) 4.0 - 10.5 K/uL   RBC 3.02 (L) 3.87 - 5.11 MIL/uL   Hemoglobin 9.1 (L) 12.0 - 15.0 g/dL   HCT 29.7 (L) 36.0 - 46.0 %   MCV 98.3 78.0 - 100.0 fL   MCH 30.1 26.0 - 34.0 pg   MCHC 30.6 30.0 - 36.0 g/dL   RDW 15.0 11.5 - 15.5 %   Platelets 203 150 - 400 K/uL  Magnesium     Status: None   Collection  Time: 10/07/14  5:26 AM  Result Value Ref Range   Magnesium 2.0 1.7 - 2.4 mg/dL    Imaging / Studies: Dg Chest Port 1 View  10/05/2014   CLINICAL DATA:  Hypoxia  EXAM: PORTABLE CHEST - 1 VIEW  COMPARISON:  Radiograph 10/03/2014  FINDINGS: NG tube extends the stomach. Stable mildly enlarged cardiac silhouette. There is interval improvement in basilar  atelectasis seen on prior. Mild peripheral linear pattern remains.  IMPRESSION: 1. Improvement in right basilar atelectasis. 2. Mild interstitial edema pattern.   Electronically Signed   By: Suzy Bouchard M.D.   On: 10/05/2014 09:41    Medications / Allergies: per chart  Antibiotics: Anti-infectives    Start     Dose/Rate Route Frequency Ordered Stop   10/03/14 1530  cefTRIAXone (ROCEPHIN) 1 g in dextrose 5 % 50 mL IVPB - Premix     1 g 100 mL/hr over 30 Minutes Intravenous Every 24 hours 10/03/14 1454     10/02/14 2200  Levofloxacin (LEVAQUIN) IVPB 250 mg  Status:  Discontinued     250 mg 50 mL/hr over 60 Minutes Intravenous Every 24 hours 10/01/14 2105 10/02/14 2006   10/02/14 2200  Levofloxacin (LEVAQUIN) IVPB 250 mg  Status:  Discontinued     250 mg 50 mL/hr over 60 Minutes Intravenous Every 24 hours 10/02/14 1000 10/03/14 1454   10/02/14 0800  clindamycin (CLEOCIN) IVPB 900 mg     900 mg 100 mL/hr over 30 Minutes Intravenous To [facility] 10/01/14 1238 10/02/14 0933   10/02/14 0800  gentamicin (GARAMYCIN) IVPB 100 mg     100 mg 200 mL/hr over 30 Minutes Intravenous To [facility] 10/01/14 1238 10/02/14 0938   10/01/14 2200  levofloxacin (LEVAQUIN) IVPB 500 mg     500 mg 100 mL/hr over 60 Minutes Intravenous  Once 10/01/14 2059 10/02/14 0104        Note: Portions of this report may have been transcribed using voice recognition software. Every effort was made to ensure accuracy; however, inadvertent computerized transcription errors may be present.   Any transcriptional errors that result from this process are  unintentional.     [facility] [phone]  10/07/2014  CARE TEAM:  PCP: [name], PA-C  Outpatient Care Team: Patient Care Team: Tiffany Felipe Drone, PA-C as PCP - General (Physician Assistant)  Inpatient Treatment Team: Treatment Team: Attending Provider: Eugenie Filler, MD; Consulting Physician: [facility]; Consulting Physician: Md Edison Pace, MD; Rounding Team: Tacey Ruiz, MD; Technician: Benjamine Sprague, NT; Registered Nurse: Nadara Mode, RN; Technician: Si Raider, NT; Registered Nurse: Roselind Rily, RN; Technician: Roger Shelter, NT; Technician: Army Chaco, NT; Registered Nurse: Candida Peeling, RN

## 2014-10-07 NOTE — Progress Notes (Signed)
Alexa Key made aware of abdl xray result. No new order received. Said Md to address in AM. Nausea is much better at this time per pt.

## 2014-10-07 NOTE — Care Management Note (Signed)
Case Management Note  Patient Details  Name: Alexa Key MRN: [id number] Date of Birth: 05-05-1950  Subjective/Objective:                    Action/Plan: PT recommending SNF , SW following   Expected Discharge Date:        10-10-14          Expected Discharge Plan:  [place]  In-House Referral:  Clinical Social Work  Discharge planning Services  CM Consult  Post Acute Care Choice:    Choice offered to:     DME Arranged:    DME Agency:     HH Arranged:    [place] Agency:     Status of Service:  In process, will continue to follow  Medicare Important Message Given:  Yes Date Medicare IM Given:  10/06/14 Medicare IM give by:  Pearla Dubonnet RN Date Additional Medicare IM Given:  10/07/14 Additional Medicare Important Message give by:  Magdalen Spatz RN BSN   If discussed at [place] of Stay Meetings, dates discussed:    Additional Comments:  Marilu Favre, RN 10/07/2014, 11:16 AM

## 2014-10-07 NOTE — Consult Note (Addendum)
WOC wound consult note Reason for Consult: placement of NPWT VAC dressing Wound type: midline surgical  Pressure Ulcer POA: No Measurement:20cm x 5.5cm x 4.5cm  Wound bed: subcutaneous tissue Drainage (amount, consistency, odor) minimal  Periwound:intact  Dressing procedure/placement/frequency: 1pc of black foam used to fill wound bed, seal obtained at 186mmHG.  Pt tolerated but did have quite a bit of discomfort. She yelled out and did press her PCA several times.   [place] for bedside nurses to change moving forward.  Orders written  Plans for ostomy teaching today, however after dressing application patient is not able to focus on teaching session.  Pouch intact, stoma dusky  Will plan to see patient in the am for educational session, possible DC to SNF per patient.  [place] team will follow along with you for continued support with ostomy care and wound care as needed. Gisel Vipond Boling RN,CWOCN [phone]

## 2014-10-07 NOTE — Clinical Social Work Note (Signed)
CSW spoke with patient and her daughter about going to a SNF for short term rehab.  Patient and daughter both agree to go to a a facility for short term rehab.  Patient was pleasant and talkative, patient's daughter was grateful for the call about considering SNF placements.  Patient's daughter states she travels several times during the week for her job, and is not available to help take care of her mom.  PT and OT are recommending SNF, patient has been faxed out and FL2 on chart waiting for signature.  Patient's daughter asked CSW if patient's physician could call her with an update on patient's progress and anticipated discharge date.  CSW to continue to follow patient's discharge planning.  Jones Broom. [place], MSW, [place] 10/07/2014 1:43 PM

## 2014-10-07 NOTE — Clinical Social Work Placement (Signed)
   CLINICAL SOCIAL WORK PLACEMENT  NOTE  Date:  10/07/2014  Patient Details  Name: DAMANI RANDO MRN: [id number] Date of Birth: April 15, 1951  Clinical Social Work is seeking post-discharge placement for this patient at the [place] level of care (*CSW will initial, date and re-position this form in  chart as items are completed):  Yes   Patient/family provided with [place] Work Department's list of facilities offering this level of care within the geographic area requested by the patient (or if unable, by the patient's family).  Yes   Patient/family informed of their freedom to choose among providers that offer the needed level of care, that participate in Medicare, Medicaid or managed care program needed by the patient, have an available bed and are willing to accept the patient.  Yes   Patient/family informed of [facility]'s ownership interest in [facility] and [facility], as well as of the fact that they are under no obligation to receive care at these facilities.  PASRR submitted to EDS on 10/07/14     PASRR number received on 10/07/14     Existing PASRR number confirmed on       FL2 transmitted to all facilities in geographic area requested by pt/family on       FL2 transmitted to all facilities within larger geographic area on 10/07/14     Patient informed that his/her managed care company has contracts with or will negotiate with certain facilities, including the following:            Patient/family informed of bed offers received.  Patient chooses bed at       Physician recommends and patient chooses bed at      Patient to be transferred to   on  .  Patient to be transferred to facility by       Patient family notified on   of transfer.  Name of family member notified:        PHYSICIAN Please sign FL2     Additional Comment:    _______________________________________________ Ross Ludwig, LCSWA 10/07/2014, 4:36  PM

## 2014-10-08 LAB — CBC
HCT: 29.2 % — ABNORMAL LOW (ref 36.0–46.0)
Hemoglobin: 8.8 g/dL — ABNORMAL LOW (ref 12.0–15.0)
MCH: 30.2 pg (ref 26.0–34.0)
MCHC: 30.1 g/dL (ref 30.0–36.0)
MCV: 100.3 fL — AB (ref 78.0–100.0)
PLATELETS: 205 10*3/uL (ref 150–400)
RBC: 2.91 MIL/uL — AB (ref 3.87–5.11)
RDW: 15.2 % (ref 11.5–15.5)
WBC: 11.3 10*3/uL — ABNORMAL HIGH (ref 4.0–10.5)

## 2014-10-08 LAB — BASIC METABOLIC PANEL
Anion gap: 11 (ref 5–15)
BUN: 19 mg/dL (ref 6–20)
CALCIUM: 8.1 mg/dL — AB (ref 8.9–10.3)
CHLORIDE: 107 mmol/L (ref 101–111)
CO2: 25 mmol/L (ref 22–32)
Creatinine, Ser: 1.26 mg/dL — ABNORMAL HIGH (ref 0.44–1.00)
GFR calc non Af Amer: 43 mL/min — ABNORMAL LOW (ref >60)
GFR, EST AFRICAN AMERICAN: 50 mL/min — AB (ref >60)
GLUCOSE: 66 mg/dL (ref 65–99)
Potassium: 4.1 mmol/L (ref 3.5–5.1)
SODIUM: 143 mmol/L (ref 135–145)

## 2014-10-08 MED ORDER — HYDRALAZINE HCL 20 MG/ML IJ SOLN: 10.0000 mg | Freq: Four times a day (QID) | INTRAMUSCULAR | Status: DC | PRN

## 2014-10-08 NOTE — Progress Notes (Signed)
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 Patient had decreased appetite. Patient was scheduled for cardiac catheterization on 10/03/2014 for medical clearance for surgical intervention of fistula or stricture. Patient was admitted and general surgery consulted as patient had presented with a colonic obstruction. Cardiology was consulted and patient was scheduled for cardiac catheterization. On the day of cardiac catheterization patient was noted to be in acute renal failure with a creatinine going up as high as 2.59. Cardiac catheterization was canceled. Patient and family accepted the risk for surgery and patient subsequently underwent a sigmoid colon colectomy with end colostomy takedown of colovaginal fistula. Patient was placed in the step down unit initially. Patient was noted to have a significant leukocytosis. Patient was pancultured and noted to have a Escherichia coli UTI. Patient was initially placed empirically on IV Levaquin however urine cultures were resistant to fluoroquinolones and antibiotics were switched to IV Rocephin. Patient improved clinically has been started on clear liquids per general surgery and antibiotics changed to oral Ceftin.  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 Brantley Stage 10/02/2014.  -Colostomy with stool. Per general surgery. -KUB with obstruction, per surgery probably ileus.  -nausea improved.  On IV fluids due to NPO status.   2 coronary artery  disease with intermediate risk Myoview Patient denies any chest or no shortness of breath. Patient had an abnormal Myoview and was subsequently scheduled for cardiac catheterization which was to be done, however due to worsening renal function cardiac catheterization has been canceled. Patient will likely need to follow-up with cardiology as outpatient.   3 acute on chronic kidney disease III ( baseline creatinine 1.2-1.5) Differential includes prerenal versus renal secondary to ATN. Patient with recent CT scan done with contrast. Patient also with small bowel obstruction with several episodes of emesis and nausea with poor oral intake. Renal function slowly trending down currently at 1..2 from 2.59.  FENa = 0.2%. Renal ultrasound negative for hydronephrosis.  On iv fluids, patient npo.   4 hypertension IV hydralazine PRN Continue with metoprolol, norvasc, cozaar.   5 COPD Stable. Continue Symbicort and spiriva  6 colovaginal fistula Patient status post sigmoid colon colectomy with end colostomy takedown of colovaginal fistula 10/02/2014 per Dr. Brantley Stage. Per general surgery.  [place] UTI Patient with improvement with dysuria. Urine cultures with greater than 100,000 Escherichia coli. Continue with Ceftin.  antibiotics day 5/7.  8 abnormal chest x-ray Chest x-ray with concerns for possible pneumonia. Patient afebrile. Patient does have a leukocytosis which is trending down. Repeated  CXR with improving atelectasis and mild interstitial edema pattern.  Continue IS, flutter valve.   9 leukocytosis Likely secondary to problem to UTI.  and reactive secondary to recent surgery.  Urine cultures with Escherichia coli. Blood cultures with no growth to date. D5/7. Now on  Ceftin.   10 SVT Patient noted to have SVT 2 nights ago. No further SVT. Patient asymptomatic.  Continue with metoprolol.  k at 4.1.   11 tobacco abuse Nicotine patch. Tobacco cessation.  12 prophylaxis PPI for GI  prophylaxis. Lovenox for DVT prophylaxis.  Code Status: Full Family Communication: Updated patient. Daughter at bedside.  Disposition Plan: Remain inpatient. Per general surgery.   Consultants:  Gen. surgery: Dr. Brantley Stage 09/30/2014  Cardiology: Dr. Ellyn Hack 09/30/2014  Procedures:  CT abdomen and pelvis 09/29/2014  Renal ultrasound 10/01/2014  Chest x-ray 10/03/2014, 10/05/14 Sigmoid colon colectomy with in colostomy takedown of colovaginal fistula per Dr. Brantley Stage 10/02/2014  Antibiotics:  IV Levaquin 10/01/2014 >>> 10/03/2014  IV Rocephin 10/03/2014>>>>> 10/07/2014  Oral Ceftin 10/07/2014  HPI/Subjective: Events from last night notice. Multiple episodes of vomiting. Patient is feeling better today. No further vomiting.  No worsening abdominal pain. No dyspnea.   Objective: Filed Vitals:   10/08/14 1234  BP: 183/62  Pulse: 87  Temp: 98.5 F (36.9 C)  Resp: 20    Intake/Output Summary (Last 24 hours) at 10/08/14 1631 Last data filed at 10/08/14 1415  Gross per 24 hour  Intake 1531.25 ml  Output    850 ml  Net 681.25 ml   Filed Weights   09/29/14 1900 09/30/14 0534 10/01/14 0602  Weight: 65.409 kg (144 lb 3.2 oz) 66.497 kg (146 lb 9.6 oz) 66.679 kg (147 lb)    Exam:   General:  NAD.   Cardiovascular: RRR  Respiratory: CTAB anterior lung fields  Abdomen: Soft,. some bowel sounds, Colostomy with stool.  Musculoskeletal: No clubbing cyanosis or edema.  Data Reviewed: Basic Metabolic Panel:  Recent Labs Lab 10/03/14 0309 10/04/14 0325 10/05/14 0303 10/06/14 0309 10/07/14 0526 10/08/14 0520  NA 145 149* 147* 145 145 143  K 4.1 4.0 4.0 3.9 3.4* 4.1  CL 117* 114* 109 109 107 107  CO2 18* 25 27 27 28 25   GLUCOSE 103* 127* 126* 111* 75 66  BUN 46* 46* 33* 23* 17 19  CREATININE 2.36* 2.13* 1.68* 1.37* 1.33* 1.26*  CALCIUM 6.7* 7.5* 8.0* 8.2* 8.3* 8.1*  MG 1.8 2.2 2.2  --  2.0  --   PHOS 5.4*  --   --   --   --   --    Liver Function  Tests:  Recent Labs Lab 10/03/14 0309  ALBUMIN 2.2*   No results for input(s): LIPASE, AMYLASE in the last 168 hours. No results for input(s): AMMONIA in the last 168 hours. CBC:  Recent Labs Lab 10/03/14 0309 10/04/14 0325 10/05/14 0303 10/06/14 0309 10/07/14 0526 10/08/14 0520  WBC 15.1* 14.7* 15.3* 14.3* 11.5* 11.3*  NEUTROABS 12.8* 12.1*  --   --   --   --   HGB 9.2* 8.9* 8.5* 8.5* 9.1* 8.8*  HCT 29.2* 28.7* 28.3* 27.5* 29.7* 29.2*  MCV 98.6 98.3 99.6 98.9 98.3 100.3*  PLT 245 222 211 218 203 205   Cardiac Enzymes: No results for input(s): CKTOTAL, CKMB, CKMBINDEX, TROPONINI in the last 168 hours. BNP (last 3 results) No results for input(s): BNP in the last 8760 hours.  ProBNP (last 3 results) No results for input(s): PROBNP in the last 8760 hours.  CBG:  Recent Labs Lab 10/01/14 2132 10/02/14 0558 10/02/14 0817 10/02/14 1315  GLUCAP 90 82 74 159*    Recent Results (from the past 240 hour(s))  Culture, blood (routine x 2)     Status: None   Collection Time: 09/30/14 12:32 PM  Result Value Ref Range Status   Specimen Description BLOOD LEFT HAND  Final   Special Requests BOTTLES DRAWN AEROBIC ONLY 10CC  Final   Culture   Final    NO GROWTH 5 DAYS Performed at [facility]  Report Status 10/06/2014 FINAL  Final  Culture, blood (routine x 2)     Status: None   Collection Time: 09/30/14 12:41 PM  Result Value Ref Range Status   Specimen Description BLOOD RIGHT HAND  Final   Special Requests BOTTLES DRAWN AEROBIC ONLY [place]  Final   Culture   Final    NO GROWTH 5 DAYS Performed at [facility]    Report Status 10/06/2014 FINAL  Final  Culture, Urine     Status: None   Collection Time: 09/30/14  5:47 PM  Result Value Ref Range Status   Specimen Description URINE, RANDOM  Final   Special Requests NONE  Final   Colony Count   Final    >=100,000 COLONIES/ML Performed at [facility]    Culture   Final    Multiple bacterial  morphotypes present, none predominant. Suggest appropriate recollection if clinically indicated. Performed at [facility]    Report Status 10/01/2014 FINAL  Final  Culture, Urine     Status: None   Collection Time: 10/01/14  2:31 PM  Result Value Ref Range Status   Specimen Description URINE, RANDOM  Final   Special Requests NONE  Final   Colony Count   Final    >=100,000 COLONIES/ML Performed at [facility]    Culture   Final    ESCHERICHIA COLI Performed at [facility]    Report Status 10/03/2014 FINAL  Final   Organism ID, Bacteria ESCHERICHIA COLI  Final      Susceptibility   Escherichia coli - MIC*    AMPICILLIN >=32 RESISTANT Resistant     CEFAZOLIN 16 INTERMEDIATE Intermediate     CEFTRIAXONE <=1 SENSITIVE Sensitive     CIPROFLOXACIN >=4 RESISTANT Resistant     GENTAMICIN <=1 SENSITIVE Sensitive     LEVOFLOXACIN >=8 RESISTANT Resistant     NITROFURANTOIN <=16 SENSITIVE Sensitive     TOBRAMYCIN <=1 SENSITIVE Sensitive     TRIMETH/SULFA <=20 SENSITIVE Sensitive     PIP/TAZO <=4 SENSITIVE Sensitive     * ESCHERICHIA COLI  Surgical pcr screen     Status: None   Collection Time: 10/01/14 10:28 PM  Result Value Ref Range Status   MRSA, PCR NEGATIVE NEGATIVE Final   Staphylococcus aureus NEGATIVE NEGATIVE Final    Comment:        The Xpert SA Assay (FDA approved for NASAL specimens in patients over 30 years of age), is one component of a comprehensive surveillance program.  Test performance has been validated by [facility] for patients greater than or equal to 74 year old. It is not intended to diagnose infection nor to guide or monitor treatment.      Studies: Dg Abd 1 View  10/07/2014   CLINICAL DATA:  64 year old with vomiting. Recent abdominal surgery.  EXAM: ABDOMEN - 1 VIEW  COMPARISON:  Prior CT scan of the abdomen and pelvis 09/29/2014  FINDINGS: Multiple dilated air-filled loops of small bowel noted throughout abdomen. Left  lower quadrant noted. No evidence of large free air on this supine. Small right pleural. Surgical changes prior interbody cage placement -S1.  IMPRESSION: Dilated and air-filled loops of small bowel in the mid abdomen concerning for small bowel obstruction.  Small right pleural effusion.   Electronically Signed   By: Jacqulynn Cadet M.D.   On: 10/07/2014 21:47    Scheduled Meds: . amLODipine  10 mg Oral Daily  . aspirin  325 mg Oral Daily  .  budesonide-formoterol  2 puff Inhalation BID  . cefUROXime  500 mg Oral BID WC  . enoxaparin (LOVENOX) injection  40 mg Subcutaneous Daily  . fentaNYL   Intravenous 6 times per day  . lip balm  1 application Topical BID  . losartan  100 mg Oral Daily  . metoprolol tartrate  12.5 mg Oral BID  . nicotine  7 mg Transdermal Daily  . pantoprazole  40 mg Oral Daily  . pneumococcal 23 valent vaccine  0.5 mL Intramuscular Tomorrow-1000  . saccharomyces boulardii  250 mg Oral BID  . tiotropium  18 mcg Inhalation Daily   Continuous Infusions: . 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 [facility] Pager [phone]. If 7PM-7AM, please contact night-coverage at [email or web address], password [facility] 10/08/2014, 4:31 PM  LOS: 9 days

## 2014-10-08 NOTE — Progress Notes (Signed)
Patient ID: Alexa Key, female   DOB: August 26, 1950, 64 y.o.   MRN: [id number] 6 Days Post-Op  Subjective: Pt had some nausea and emesis overnight.  Feels better this am.  Pain is still about the same  Objective: Vital signs in last 24 hours: Temp:  [97.8 F (36.6 C)-98.7 F (37.1 C)] 97.8 F (36.6 C) (06/08 0508) Pulse Rate:  [62-78] 78 (06/08 0508) Resp:  [15-21] 21 (06/08 0848) BP: (150-170)/(59-68) 150/59 mmHg (06/08 0508) SpO2:  [95 %-100 %] 97 % (06/08 0848) Last BM Date: 10/06/14  Intake/Output from previous day: 06/07 0701 - 06/08 0700 In: 2191.3 [P.O.:1000; I.V.:1191.3] Out: -  Intake/Output this shift:    PE: Abd: soft, but has some upper abdominal distention, hypoactive to absent BS, ostomy has some thick pudding like stool in the bag though, wound with wound VAC in place.  Lab Results:   Recent Labs  10/07/14 0526 10/08/14 0520  WBC 11.5* 11.3*  HGB 9.1* 8.8*  HCT 29.7* 29.2*  PLT 203 205   BMET  Recent Labs  10/07/14 0526 10/08/14 0520  NA 145 143  K 3.4* 4.1  CL 107 107  CO2 28 25  GLUCOSE 75 66  BUN 17 19  CREATININE 1.33* 1.26*  CALCIUM 8.3* 8.1*   PT/INR No results for input(s): LABPROT, INR in the last 72 hours. CMP     Component Value Date/Time   NA 143 10/08/2014 0520   NA 145* 01/17/2013 1703   K 4.1 10/08/2014 0520   CL 107 10/08/2014 0520   CO2 25 10/08/2014 0520   GLUCOSE 66 10/08/2014 0520   GLUCOSE 82 01/17/2013 1703   BUN 19 10/08/2014 0520   BUN 26 01/17/2013 1703   CREATININE 1.26* 10/08/2014 0520   CREATININE 1.69* 11/05/2012 1541   CALCIUM 8.1* 10/08/2014 0520   PROT 7.2 09/29/2014 1107   PROT 6.5 01/17/2013 1703   ALBUMIN 2.2* 10/03/2014 0309   AST 22 09/29/2014 1107   ALT 18 09/29/2014 1107   ALKPHOS 77 09/29/2014 1107   BILITOT 0.3 09/29/2014 1107   GFRNONAA 43* 10/08/2014 0520   GFRNONAA 32* 11/05/2012 1541   GFRAA 50* 10/08/2014 0520   GFRAA 36* 11/05/2012 1541   Lipase     Component Value Date/Time    LIPASE 29 09/29/2014 1108       Studies/Results: Dg Abd 1 View  10/07/2014   CLINICAL DATA:  64 year old with vomiting. Recent abdominal surgery.  EXAM: ABDOMEN - 1 VIEW  COMPARISON:  Prior CT scan of the abdomen and pelvis 09/29/2014  FINDINGS: Multiple dilated air-filled loops of small bowel noted throughout abdomen. Left lower quadrant noted. No evidence of large free air on this supine. Small right pleural. Surgical changes prior interbody cage placement -S1.  IMPRESSION: Dilated and air-filled loops of small bowel in the mid abdomen concerning for small bowel obstruction.  Small right pleural effusion.   Electronically Signed   By: Jacqulynn Cadet M.D.   On: 10/07/2014 21:47    Anti-infectives: Anti-infectives    Start     Dose/Rate Route Frequency Ordered Stop   10/07/14 1800  cefUROXime (CEFTIN) tablet 500 mg     500 mg Oral 2 times daily with meals 10/07/14 1203 10/12/14 1759   10/03/14 1530  cefTRIAXone (ROCEPHIN) 1 g in dextrose 5 % 50 mL IVPB - Premix  Status:  Discontinued     1 g 100 mL/hr over 30 Minutes Intravenous Every 24 hours 10/03/14 1454 10/07/14 1203   10/02/14  2200  Levofloxacin (LEVAQUIN) IVPB 250 mg  Status:  Discontinued     250 mg 50 mL/hr over 60 Minutes Intravenous Every 24 hours 10/01/14 2105 10/02/14 2006   10/02/14 2200  Levofloxacin (LEVAQUIN) IVPB 250 mg  Status:  Discontinued     250 mg 50 mL/hr over 60 Minutes Intravenous Every 24 hours 10/02/14 1000 10/03/14 1454   10/02/14 0800  clindamycin (CLEOCIN) IVPB 900 mg     900 mg 100 mL/hr over 30 Minutes Intravenous To [facility] 10/01/14 1238 10/02/14 0933   10/02/14 0800  gentamicin (GARAMYCIN) IVPB 100 mg     100 mg 200 mL/hr over 30 Minutes Intravenous To [facility]Stay [facility] 10/01/14 1238 10/02/14 0938   10/01/14 2200  levofloxacin (LEVAQUIN) IVPB 500 mg     500 mg 100 mL/hr over 60 Minutes Intravenous  Once 10/01/14 2059 10/02/14 0104       Assessment/Plan  Large bowel obstruction  secondary to sigmoid colon stricture and colovaginal fistula  POD#6 exploratory laparotomy with sigmoid colectomy, colovaginal fistula takedown and colostomy--Dr. Brantley Stage -patient has nausea and emesis overnight.  Placed back to NPO with ice chips.  X-rays show some small bowel dilatation c/w ileus.  No NGT for now, if she continues to have N/V will need one replaced -PT eval -stoma is purple, but viable, monitor -VAC in place, TTS ID-on ceftin for UTI. WBC stable 11K VTE prophylaxis-SCD/heparin Pulmonary-hypoxia has resolved, CXR ok.. Has COPD. Pulling 588ml on IS which is improved.  FEN-NPO x ice, cont PCA for now.     LOS: 9 days    [name] E 10/08/2014, 10:28 AM Pager: [phone]

## 2014-10-08 NOTE — Progress Notes (Signed)
Abdl xray result relayed to Dr Donne Hazel. No order at this time. Pt denies nausea.

## 2014-10-08 NOTE — Progress Notes (Signed)
Occupational Therapy Treatment Patient Details Name: Alexa Key MRN: [id number] DOB: 07-28-1950 Today's Date: 10/08/2014    History of present illness Patient is a 64 y/o female s/p exploratory laparotomy with sigmoid colectomy, colovaginal fistula takedown and colostomy 6/2. PMH includes CAD,PVD, CVA, HTN,COPD, tobacco use, fibromyalgia and DM.   OT comments  Patient making slow progress towards goals, continue plan of care for now. Pt continues to be limited by pain and decreased overall activity tolerance/endurance. Pt will continue to benefit from [facility] prior to discharging > home.    Follow Up Recommendations  SNF;Supervision/Assistance - 24 hour    Equipment Recommendations  3 in 1 bedside comode;Other (comment) (TBD next venue of care)    Recommendations for Other Services  None at this time  Precautions / Restrictions Precautions Precautions: Fall Restrictions Weight Bearing Restrictions: No       Mobility Bed Mobility   Bed Mobility: Sidelying to Sit;Sit to Sidelying   Sidelying to sit: Mod assist Sit to sidelying: Mod assist General bed mobility comments: Cues for technique. Assistance for trunk control/support. No complaints of dizziness, but some pain.   Transfers Overall transfer level: Needs assistance Equipment used: Rolling walker (2 wheeled) Transfers: Sit to/from Stand Sit to Stand: Min assist   General transfer comment: Sit>stand from bed, then functional ambulation ~5 feet > recliner. Stand>sit with min assist and min verbal cues for hand placement and safety.     Balance Overall balance assessment: Needs assistance Sitting-balance support: No upper extremity supported;Feet supported Sitting balance-Leahy Scale: Fair     Standing balance support: Bilateral upper extremity supported;During functional activity Standing balance-Leahy Scale: Fair   ADL Overall ADL's : Needs assistance/impaired General ADL Comments: Patient engaged in bed mobility  and sat EOB for donning/doffing of socks. Pt with leak in colostomy bag, notified RN and pt not charged for this time. Pt laid back in supine. Pt then engaged in bed mobility again, adjusted socks, then transferred > recliner. Pt continues to be limited by pain and decreased activity tolerance/endurance. PCA encouraged.      Cognition   Behavior During Therapy: WFL for tasks assessed/performed Overall Cognitive Status: Within Functional Limits for tasks assessed                 Pertinent Vitals/ Pain       Pain Assessment: Faces Faces Pain Scale: Hurts even more Pain Location: abdomen with bed mobility and sit<>stand transfers Pain Descriptors / Indicators: Grimacing Pain Intervention(s): Limited activity within patient's tolerance;Monitored during session;Repositioned   Frequency Min 2X/week     Progress Toward Goals  OT Goals(current goals can now befound in the care plan section)  Progress towards OT goals: Progressing toward goals     Plan Discharge plan remains appropriate    End of Session Equipment Utilized During Treatment: Oxygen;Rolling walker   Activity Tolerance Patient tolerated treatment well   Patient Left in chair;with call bell/phone within reach;with family/visitor present  Nurse Communication Other (comment) (leak with colostomy )     Time: 6967-8938 OT Time Calculation (min): 40 min  Charges: OT General Charges $OT Visit: 1 Procedure OT Treatments $Self Care/Home Management : 23-37 mins  Gaynor Ferreras , MS, OTR/L, CLT Pager: [phone]  10/08/2014, 3:29 PM

## 2014-10-08 NOTE — Progress Notes (Signed)
PT Cancellation Note  Patient Details Name: Alexa Key MRN: [id number] DOB: 01-02-1951   Cancelled Treatment:    Reason Eval/Treat Not Completed: Patient declined, no reason specified Pt just got back into bed after sitting up in chair upon PT arrival and declined working with therapist due to being tired. Worked with OT earlier. Will follow up next available time.    [name] A Victoria Henshaw 10/08/2014, 4:35 PM Wray Kearns, [place], DPT [phone]

## 2014-10-09 LAB — BASIC METABOLIC PANEL
Anion gap: 13 (ref 5–15)
BUN: 18 mg/dL (ref 6–20)
CHLORIDE: 112 mmol/L — AB (ref 101–111)
CO2: 20 mmol/L — ABNORMAL LOW (ref 22–32)
Calcium: 8.2 mg/dL — ABNORMAL LOW (ref 8.9–10.3)
Creatinine, Ser: 1.34 mg/dL — ABNORMAL HIGH (ref 0.44–1.00)
GFR calc non Af Amer: 40 mL/min — ABNORMAL LOW (ref >60)
GFR, EST AFRICAN AMERICAN: 47 mL/min — AB (ref >60)
Glucose, Bld: 62 mg/dL — ABNORMAL LOW (ref 65–99)
Potassium: 4.5 mmol/L (ref 3.5–5.1)
Sodium: 145 mmol/L (ref 135–145)

## 2014-10-09 LAB — CBC
HCT: 29.8 % — ABNORMAL LOW (ref 36.0–46.0)
HEMOGLOBIN: 8.8 g/dL — AB (ref 12.0–15.0)
MCH: 30.4 pg (ref 26.0–34.0)
MCHC: 29.5 g/dL — ABNORMAL LOW (ref 30.0–36.0)
MCV: 103.1 fL — AB (ref 78.0–100.0)
PLATELETS: 234 10*3/uL (ref 150–400)
RBC: 2.89 MIL/uL — AB (ref 3.87–5.11)
RDW: 15.3 % (ref 11.5–15.5)
WBC: 11.3 10*3/uL — ABNORMAL HIGH (ref 4.0–10.5)

## 2014-10-09 MED ORDER — DEXTROSE 50 % IV SOLN
INTRAVENOUS | Status: AC
  Filled 2014-10-09: qty 50

## 2014-10-09 MED ORDER — DEXTROSE-NACL 5-0.9 % IV SOLN
INTRAVENOUS | Status: DC
  Administered 2014-10-09: 07:00:00 via INTRAVENOUS

## 2014-10-09 MED ORDER — HYDROCODONE-ACETAMINOPHEN 5-325 MG PO TABS
1.0000 | ORAL_TABLET | ORAL | Status: DC | PRN
  Administered 2014-10-10 (×2): 2 via ORAL
  Filled 2014-10-09 (×2): qty 2
  Filled 2014-10-09: qty 1
  Filled 2014-10-09: qty 2

## 2014-10-09 MED ORDER — DEXTROSE 50 % IV SOLN
1.0000 | Freq: Once | INTRAVENOUS | Status: AC
  Administered 2014-10-09: 50 mL via INTRAVENOUS

## 2014-10-09 MED ORDER — FENTANYL CITRATE (PF) 100 MCG/2ML IJ SOLN
12.5000 ug | INTRAMUSCULAR | Status: DC | PRN
  Administered 2014-10-09: 12.5 ug via INTRAVENOUS
  Administered 2014-10-09 – 2014-10-16 (×10): 50 ug via INTRAVENOUS
  Filled 2014-10-09 (×12): qty 2

## 2014-10-09 NOTE — Progress Notes (Addendum)
Physical Therapy Treatment Patient Details Name: Alexa Key MRN: [id number] DOB: 1951/04/15 Today's Date: 10/09/2014    History of Present Illness Patient is a 64 y/o female s/p exploratory laparotomy with sigmoid colectomy, colovaginal fistula takedown and colostomy 6/2. PMH includes CAD,PVD, CVA, HTN,COPD, tobacco use, fibromyalgia and DM.    PT Comments    Patient making progress towards PT goals. Tolerated increased ambulation and performing transfers with less assist this session. Will continue to see and progress as tolerated.  Follow Up Recommendations  SNF;Supervision/Assistance - 24 hour     Equipment Recommendations  Other (comment) (defer to next venue)    Recommendations for Other Services       Precautions / Restrictions Precautions Precautions: Fall Restrictions Weight Bearing Restrictions: No    Mobility  Bed Mobility Overal bed mobility: Needs Assistance Bed Mobility: Sidelying to Sit;Sit to Sidelying Rolling: Min assist Sidelying to sit: Mod assist     Sit to sidelying: Mod assist General bed mobility comments: Cues for technique. Assistance for trunk control/support. No complaints of dizziness, but some pain.   Transfers Overall transfer level: Needs assistance Equipment used: Rolling walker (2 wheeled) Transfers: Sit to/from [facility] Sit to Stand: Min guard Stand pivot transfers: Min assist       General transfer comment: transferred to bedside commode with min assist for stability, sit <> stand from bed and bedside commode  Ambulation/Gait Ambulation/Gait assistance: Min guard Ambulation Distance (Feet): 90 Feet Assistive device: Rolling walker (2 wheeled) Gait Pattern/deviations: Step-through pattern;Wide base of support;Decreased stride length;Decreased step length - right;Decreased step length - left;Trunk flexed   Gait velocity interpretation: Below normal speed for age/gender General Gait Details: ambulated on 3  liters O2 but required 3 standing rest breaks   Stairs            Wheelchair Mobility    Modified Rankin (Stroke Patients Only)       Balance     Sitting balance-Leahy Scale: Fair       Standing balance-Leahy Scale: Fair                      Cognition Arousal/Alertness: Awake/alert Behavior During Therapy: WFL for tasks assessed/performed Overall Cognitive Status: Within Functional Limits for tasks assessed                      Exercises      General Comments        Pertinent Vitals/Pain Pain Assessment: Faces Faces Pain Scale: Hurts little more Pain Location: abdomen with bed mobility and sit<>stand transfers Pain Descriptors / Indicators: Aching;Discomfort;Grimacing;Guarding Pain Intervention(s): Limited activity within patient's tolerance;Monitored during session;Repositioned    Home Living                      Prior Function            PT Goals (current goals can now be found in the care plan section) Acute Rehab PT Goals Patient Stated Goal: to live independently again PT Goal Formulation: With patient/family Time For Goal Achievement: 10/20/14 Potential to Achieve Goals: Good Progress towards PT goals: Progressing toward goals    Frequency  Min 3X/week    PT Plan Current plan remains appropriate    Co-evaluation             End of Session Equipment Utilized During Treatment: Gait belt;Oxygen Activity Tolerance: Patient tolerated treatment well;Patient limited by pain Patient left: with call bell/phone within  reach;with nursing/sitter in room;with family/visitor present (sitting EOB)     Time: 1660-6301 PT Time Calculation (min) (ACUTE ONLY): 14 min  Charges:  $Gait Training: 8-22 mins                    G CodesDuncan Dull 10-25-2014, 4:17 PM Alben Deeds, [place] DPT  [phone]

## 2014-10-09 NOTE — Progress Notes (Signed)
[facility] Progress Note  7 Days Post-Op  Subjective: Pt doing very well.  Ambulating in room with assistance.  Having flatus in bag, liquid still minimal.  Not as much pain, no N/V.  Thinks it was the pain med that made her sick.  Feels good today.  Wants to drink liquids.    Objective: Vital signs in last 24 hours: Temp:  [98.3 F (36.8 C)-98.6 F (37 C)] 98.5 F (36.9 C) (06/09 0427) Pulse Rate:  [59-87] 65 (06/09 0427) Resp:  [19-21] 19 (06/09 0857) BP: (139-183)/(48-62) 156/48 mmHg (06/09 0427) SpO2:  [92 %-100 %] 94 % (06/09 0857) Last BM Date: 10/08/14  Intake/Output from previous day: 06/08 0701 - 06/09 0700 In: 50 [P.O.:180; I.V.:400] Out: 1475 [Urine:1025; Drains:50; Stool:400] Intake/Output this shift: Total I/O In: 36 [P.O.:60] Out: -   PE: Gen:  Alert, NAD, pleasant Abd: Soft, mildly tender/distended, +BS, no HSM, incisions C/D/I with wound vac with minimal output, ostomy dark, but has flatus and minimal amount of liquid brown stool.     Lab Results:   Recent Labs  10/08/14 0520 10/09/14 0413  WBC 11.3* 11.3*  HGB 8.8* 8.8*  HCT 29.2* 29.8*  PLT 205 234   BMET  Recent Labs  10/08/14 0520 10/09/14 0413  NA 143 145  K 4.1 4.5  CL 107 112*  CO2 25 20*  GLUCOSE 66 62*  BUN 19 18  CREATININE 1.26* 1.34*  CALCIUM 8.1* 8.2*   PT/INR No results for input(s): LABPROT, INR in the last 72 hours. CMP     Component Value Date/Time   NA 145 10/09/2014 0413   NA 145* 01/17/2013 1703   K 4.5 10/09/2014 0413   CL 112* 10/09/2014 0413   CO2 20* 10/09/2014 0413   GLUCOSE 62* 10/09/2014 0413   GLUCOSE 82 01/17/2013 1703   BUN 18 10/09/2014 0413   BUN 26 01/17/2013 1703   CREATININE 1.34* 10/09/2014 0413   CREATININE 1.69* 11/05/2012 1541   CALCIUM 8.2* 10/09/2014 0413   PROT 7.2 09/29/2014 1107   PROT 6.5 01/17/2013 1703   ALBUMIN 2.2* 10/03/2014 0309   AST 22 09/29/2014 1107   ALT 18 09/29/2014 1107   ALKPHOS 77 09/29/2014 1107    BILITOT 0.3 09/29/2014 1107   GFRNONAA 40* 10/09/2014 0413   GFRNONAA 32* 11/05/2012 1541   GFRAA 47* 10/09/2014 0413   GFRAA 36* 11/05/2012 1541   Lipase     Component Value Date/Time   LIPASE 29 09/29/2014 1108       Studies/Results: Dg Abd 1 View  10/07/2014   CLINICAL DATA:  64 year old with vomiting. Recent abdominal surgery.  EXAM: ABDOMEN - 1 VIEW  COMPARISON:  Prior CT scan of the abdomen and pelvis 09/29/2014  FINDINGS: Multiple dilated air-filled loops of small bowel noted throughout abdomen. Left lower quadrant noted. No evidence of large free air on this supine. Small right pleural. Surgical changes prior interbody cage placement -S1.  IMPRESSION: Dilated and air-filled loops of small bowel in the mid abdomen concerning for small bowel obstruction.  Small right pleural effusion.   Electronically Signed   By: Jacqulynn Cadet M.D.   On: 10/07/2014 21:47    Anti-infectives: Anti-infectives    Start     Dose/Rate Route Frequency Ordered Stop   10/07/14 1800  cefUROXime (CEFTIN) tablet 500 mg     500 mg Oral 2 times daily with meals 10/07/14 1203 10/12/14 1759   10/03/14 1530  cefTRIAXone (ROCEPHIN) 1 g in dextrose 5 %  50 mL IVPB - Premix  Status:  Discontinued     1 g 100 mL/hr over 30 Minutes Intravenous Every 24 hours 10/03/14 1454 10/07/14 1203   10/02/14 2200  Levofloxacin (LEVAQUIN) IVPB 250 mg  Status:  Discontinued     250 mg 50 mL/hr over 60 Minutes Intravenous Every 24 hours 10/01/14 2105 10/02/14 2006   10/02/14 2200  Levofloxacin (LEVAQUIN) IVPB 250 mg  Status:  Discontinued     250 mg 50 mL/hr over 60 Minutes Intravenous Every 24 hours 10/02/14 1000 10/03/14 1454   10/02/14 0800  clindamycin (CLEOCIN) IVPB 900 mg     900 mg 100 mL/hr over 30 Minutes Intravenous To [facility] 10/01/14 1238 10/02/14 0933   10/02/14 0800  gentamicin (GARAMYCIN) IVPB 100 mg     100 mg 200 mL/hr over 30 Minutes Intravenous To [facility] 10/01/14 1238 10/02/14  0938   10/01/14 2200  levofloxacin (LEVAQUIN) IVPB 500 mg     500 mg 100 mL/hr over 60 Minutes Intravenous  Once 10/01/14 2059 10/02/14 0104       Assessment/Plan Large bowel obstruction secondary to sigmoid colon stricture and colovaginal fistula  POD#7 exploratory laparotomy with sigmoid colectomy, colovaginal fistula takedown and colostomy--Dr. Brantley Stage -Nausea/emesis resolved.  Feels much better, wants liquids.  X-rays show some small bowel dilatation c/w ileus. No NGT for now, if she continues to have N/V will need one replaced -PT eval -stoma is purple, but viable, monitor -VAC in place, TTS ID-on ceftin for UTI. WBC stable 11K 3rd day in a row VTE prophylaxis-SCD/heparin Pulmonary-hypoxia has resolved, CXR ok. Has COPD. Pulling 52ml on IS which is improved.  FEN-Clears, d/c pca start IV push prn and orals when able, Cr. Went up to 1.34, bmet in am Disp-Await better bowel function    LOS: 10 days    Nat Christen 10/09/2014, 9:10 AM Pager: [phone]

## 2014-10-09 NOTE — Consult Note (Addendum)
WOC ostomy follow up Bedside nurses changed Vac to midline abd wound earlier today.  CCS following for assessment and plan of care. Stoma type/location:  Colostomy pouch to LLQ from 6/2 Stomal assessment/size: Stoma 100% brown, flush with skin level, 1 1/2 inches Peristomal assessment: Intact skin surrounding  Output Small amt flatus, no stool Ostomy pouching: 1pc with barrier ring to maintain seal.  Education provided:  Demonstrated pouch change using one piece with barrier ring to maintain seal.  Pt watched but did not participate or ask questions.  She was able to open and close the velcro opening with large amt assistance.   Enrolled patient in [place] Start Discharge program: No Recommend SNF or home health for further assistance after discharge since pt is weak and has not been able to perform pouching activities without assistance. Julien Girt MSN, RN, [place], [place], [place]

## 2014-10-09 NOTE — Progress Notes (Signed)
Unused fentanyl pca syringe was taken down to pharmacy and given to pharmacy tech.

## 2014-10-09 NOTE — Progress Notes (Signed)
Wasted 6cc of Fentanyl from PCA syringe, wasted with Heywood Footman., RN

## 2014-10-09 NOTE — Progress Notes (Signed)
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 Patient had decreased appetite. Patient was scheduled for cardiac catheterization on 10/03/2014 for medical clearance for surgical intervention of fistula or stricture. Patient was admitted and general surgery consulted as patient had presented with a colonic obstruction. Cardiology was consulted and patient was scheduled for cardiac catheterization. On the day of cardiac catheterization patient was noted to be in acute renal failure with a creatinine going up as high as 2.59. Cardiac catheterization was canceled. Patient and family accepted the risk for surgery and patient subsequently underwent a sigmoid colon colectomy with end colostomy takedown of colovaginal fistula. Patient was placed in the step down unit initially. Patient was noted to have a significant leukocytosis. Patient was pancultured and noted to have a Escherichia coli UTI. Patient was initially placed empirically on IV Levaquin however urine cultures were resistant to fluoroquinolones and antibiotics were switched to IV Rocephin. Patient improved clinically has been started on clear liquids per general surgery and antibiotics changed to oral Ceftin.  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 Brantley Stage 10/02/2014.  -Colostomy with stool. Per general surgery. -KUB with obstruction, per surgery probably ileus.  -nausea improved. Started on clear diet today.    2 coronary artery  disease with intermediate risk Myoview Patient denies any chest or no shortness of breath. Patient had an abnormal Myoview and was subsequently scheduled for cardiac catheterization which was to be done, however due to worsening renal function cardiac catheterization has been canceled. Patient will likely need to follow-up with cardiology as outpatient.   3 Acute on chronic kidney disease III ( baseline creatinine 1.2-1.5) Differential includes prerenal versus renal secondary to ATN. Patient with recent CT scan done with contrast. Patient also with small bowel obstruction with several episodes of emesis and nausea with poor oral intake. Renal function slowly trending down currently at 1..3 from 2.59.  FENa = 0.2%. Renal ultrasound negative for hydronephrosis.  NSL.   4 hypertension IV hydralazine PRN Continue with metoprolol, norvasc, cozaar.   5 COPD Stable. Continue Symbicort and spiriva  6 colovaginal fistula Patient status post sigmoid colon colectomy with end colostomy takedown of colovaginal fistula 10/02/2014 per Dr. Brantley Stage. Per general surgery.  [place] UTI Patient with improvement with dysuria. Urine cultures with greater than 100,000 Escherichia coli. Continue with Ceftin.  antibiotics day 6/7.  8 Abnormal chest x-ray Chest x-ray with concerns for possible pneumonia. Patient afebrile. Patient does have a leukocytosis which is trending down. Repeated  CXR with improving atelectasis and mild interstitial edema pattern.  Continue IS, flutter valve.   9 leukocytosis Likely secondary to problem to UTI.  and reactive secondary to recent surgery.  Urine cultures with Escherichia coli. Blood cultures with no growth to date. D6/7. Now on  Ceftin.   10 SVT Patient noted to have SVT 2 nights ago. No further SVT. Patient asymptomatic.  Continue with metoprolol.  k at 4.1.   11 tobacco abuse Nicotine patch. Tobacco cessation.  12 prophylaxis PPI for GI prophylaxis. Lovenox for  DVT prophylaxis.   Hypoglycemia. received amp D 50.  Started on clear diet  Code Status: Full Family Communication: Updated patient. Daughter at bedside.  Disposition Plan: Remain inpatient. Per general surgery.   Consultants:  Gen. surgery: Dr. Brantley Stage 09/30/2014  Cardiology: Dr. Ellyn Hack 09/30/2014  Procedures:  CT abdomen and pelvis 09/29/2014  Renal ultrasound 10/01/2014  Chest x-ray 10/03/2014, 10/05/14 Sigmoid colon colectomy with in colostomy takedown of colovaginal fistula per Dr. Brantley Stage 10/02/2014  Antibiotics:  IV Levaquin 10/01/2014 >>> 10/03/2014  IV Rocephin 10/03/2014>>>>> 10/07/2014  Oral Ceftin 10/07/2014  HPI/Subjective: Feeling better today, no more nausea.   Objective: Filed Vitals:   10/09/14 1325  BP: 164/56  Pulse: 65  Temp: 98.6 F (37 C)  Resp: 18    Intake/Output Summary (Last 24 hours) at 10/09/14 1550 Last data filed at 10/09/14 1436  Gross per 24 hour  Intake   1060 ml  Output    625 ml  Net    435 ml   Filed Weights   09/29/14 1900 09/30/14 0534 10/01/14 0602  Weight: 65.409 kg (144 lb 3.2 oz) 66.497 kg (146 lb 9.6 oz) 66.679 kg (147 lb)    Exam:   General:  NAD.   Cardiovascular: RRR  Respiratory: CTAB anterior lung fields  Abdomen: Soft,. some bowel sounds, Colostomy with stool. Midline incision  With wound vac  Musculoskeletal: No clubbing cyanosis or edema.  Data Reviewed: Basic Metabolic Panel:  Recent Labs Lab 10/03/14 0309 10/04/14 0325 10/05/14 0303 10/06/14 0309 10/07/14 0526 10/08/14 0520 10/09/14 0413  NA 145 149* 147* 145 145 143 145  K 4.1 4.0 4.0 3.9 3.4* 4.1 4.5  CL 117* 114* 109 109 107 107 112*  CO2 18* 25 27 27 28 25  20*  GLUCOSE 103* 127* 126* 111* 75 66 62*  BUN 46* 46* 33* 23* 17 19 18   CREATININE 2.36* 2.13* 1.68* 1.37* 1.33* 1.26* 1.34*  CALCIUM 6.7* 7.5* 8.0* 8.2* 8.3* 8.1* 8.2*  MG 1.8 2.2 2.2  --  2.0  --   --   PHOS 5.4*  --   --   --   --   --   --    Liver Function  Tests:  Recent Labs Lab 10/03/14 0309  ALBUMIN 2.2*   No results for input(s): LIPASE, AMYLASE in the last 168 hours. No results for input(s): AMMONIA in the last 168 hours. CBC:  Recent Labs Lab 10/03/14 0309 10/04/14 0325 10/05/14 0303 10/06/14 0309 10/07/14 0526 10/08/14 0520 10/09/14 0413  WBC 15.1* 14.7* 15.3* 14.3* 11.5* 11.3* 11.3*  NEUTROABS 12.8* 12.1*  --   --   --   --   --   HGB 9.2* 8.9* 8.5* 8.5* 9.1* 8.8* 8.8*  HCT 29.2* 28.7* 28.3* 27.5* 29.7* 29.2* 29.8*  MCV 98.6 98.3 99.6 98.9 98.3 100.3* 103.1*  PLT 245 222 211 218 203 205 234   Cardiac Enzymes: No results for input(s): CKTOTAL, CKMB, CKMBINDEX, TROPONINI in the last 168 hours. BNP (last 3 results) No results for input(s): BNP in the last 8760 hours.  ProBNP (last 3 results) No results for input(s): PROBNP in the last 8760 hours.  CBG: No results for input(s): GLUCAP in the last 168 hours.  Recent Results (from the past 240 hour(s))  Culture, blood (routine x 2)     Status: None   Collection Time: 09/30/14 12:32 PM  Result Value Ref Range Status   Specimen Description BLOOD LEFT HAND  Final   Special Requests BOTTLES DRAWN AEROBIC ONLY 10CC  Final   Culture   Final  NO GROWTH 5 DAYS Performed at [facility]    Report Status 10/06/2014 FINAL  Final  Culture, blood (routine x 2)     Status: None   Collection Time: 09/30/14 12:41 PM  Result Value Ref Range Status   Specimen Description BLOOD RIGHT HAND  Final   Special Requests BOTTLES DRAWN AEROBIC ONLY [place]  Final   Culture   Final    NO GROWTH 5 DAYS Performed at [facility]    Report Status 10/06/2014 FINAL  Final  Culture, Urine     Status: None   Collection Time: 09/30/14  5:47 PM  Result Value Ref Range Status   Specimen Description URINE, RANDOM  Final   Special Requests NONE  Final   Colony Count   Final    >=100,000 COLONIES/ML Performed at [facility]    Culture   Final    Multiple bacterial  morphotypes present, none predominant. Suggest appropriate recollection if clinically indicated. Performed at [facility]    Report Status 10/01/2014 FINAL  Final  Culture, Urine     Status: None   Collection Time: 10/01/14  2:31 PM  Result Value Ref Range Status   Specimen Description URINE, RANDOM  Final   Special Requests NONE  Final   Colony Count   Final    >=100,000 COLONIES/ML Performed at [facility]    Culture   Final    ESCHERICHIA COLI Performed at [facility]    Report Status 10/03/2014 FINAL  Final   Organism ID, Bacteria ESCHERICHIA COLI  Final      Susceptibility   Escherichia coli - MIC*    AMPICILLIN >=32 RESISTANT Resistant     CEFAZOLIN 16 INTERMEDIATE Intermediate     CEFTRIAXONE <=1 SENSITIVE Sensitive     CIPROFLOXACIN >=4 RESISTANT Resistant     GENTAMICIN <=1 SENSITIVE Sensitive     LEVOFLOXACIN >=8 RESISTANT Resistant     NITROFURANTOIN <=16 SENSITIVE Sensitive     TOBRAMYCIN <=1 SENSITIVE Sensitive     TRIMETH/SULFA <=20 SENSITIVE Sensitive     PIP/TAZO <=4 SENSITIVE Sensitive     * ESCHERICHIA COLI  Surgical pcr screen     Status: None   Collection Time: 10/01/14 10:28 PM  Result Value Ref Range Status   MRSA, PCR NEGATIVE NEGATIVE Final   Staphylococcus aureus NEGATIVE NEGATIVE Final    Comment:        The Xpert SA Assay (FDA approved for NASAL specimens in patients over 75 years of age), is one component of a comprehensive surveillance program.  Test performance has been validated by [facility] for patients greater than or equal to 41 year old. It is not intended to diagnose infection nor to guide or monitor treatment.      Studies: Dg Abd 1 View  10/07/2014   CLINICAL DATA:  64 year old with vomiting. Recent abdominal surgery.  EXAM: ABDOMEN - 1 VIEW  COMPARISON:  Prior CT scan of the abdomen and pelvis 09/29/2014  FINDINGS: Multiple dilated air-filled loops of small bowel noted throughout abdomen. Left  lower quadrant noted. No evidence of large free air on this supine. Small right pleural. Surgical changes prior interbody cage placement -S1.  IMPRESSION: Dilated and air-filled loops of small bowel in the mid abdomen concerning for small bowel obstruction.  Small right pleural effusion.   Electronically Signed   By: Jacqulynn Cadet M.D.   On: 10/07/2014 21:47    Scheduled Meds: . amLODipine  10 mg  Oral Daily  . aspirin  325 mg Oral Daily  . budesonide-formoterol  2 puff Inhalation BID  . cefUROXime  500 mg Oral BID WC  . enoxaparin (LOVENOX) injection  40 mg Subcutaneous Daily  . lip balm  1 application Topical BID  . losartan  100 mg Oral Daily  . metoprolol tartrate  12.5 mg Oral BID  . nicotine  7 mg Transdermal Daily  . pantoprazole  40 mg Oral Daily  . pneumococcal 23 valent vaccine  0.5 mL Intramuscular Tomorrow-1000  . saccharomyces boulardii  250 mg Oral BID  . 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 [facility] Pager [phone]. If 7PM-7AM, please contact night-coverage at [email or web address], password [facility] 10/09/2014, 3:50 PM  LOS: 10 days

## 2014-10-09 NOTE — Clinical Social Work Note (Addendum)
CSW met with patient and her daughter to present bed offers.  Patient and daughter have agreed to go to [facility] for short term rehab.  CSW contacted [facility] to confirm bed offer, left message on voice mail awaiting, call back from admissions worker.  CSW to continue to follow patient throughout discharge planning.  Jones Broom. [place], MSW, [place] 10/09/2014 4:38 PM

## 2014-10-10 MED ORDER — METHOCARBAMOL 500 MG PO TABS
1000.0000 mg | ORAL_TABLET | Freq: Three times a day (TID) | ORAL | Status: DC | PRN
  Administered 2014-10-11 (×2): 1000 mg via ORAL
  Filled 2014-10-10 (×3): qty 2

## 2014-10-10 MED ORDER — BOOST / RESOURCE BREEZE PO LIQD
1.0000 | Freq: Three times a day (TID) | ORAL | Status: DC
  Administered 2014-10-10 – 2014-10-13 (×6): 1 via ORAL

## 2014-10-10 MED ORDER — OXYCODONE HCL 5 MG PO TABS
5.0000 mg | ORAL_TABLET | Freq: Four times a day (QID) | ORAL | Status: DC | PRN
  Administered 2014-10-11: 10 mg via ORAL
  Filled 2014-10-10: qty 2

## 2014-10-10 MED ORDER — ENSURE ENLIVE PO LIQD
237.0000 mL | Freq: Three times a day (TID) | ORAL | Status: DC
  Administered 2014-10-10: 237 mL via ORAL

## 2014-10-10 NOTE — Progress Notes (Signed)
[facility] Progress Note  8 Days Post-Op  Subjective: Pt doing "so-so".  She says she's having some sharp pains in her RLQ where her heparin shots are given.  No N/V, but belching a lot.  Having flatus and some liquid BM in ostomy bag.  Urinating well.  Ambulating some in room with PT.  Not very hungry, but tolerating clears.    Objective: Vital signs in last 24 hours: Temp:  [97.7 F (36.5 C)-98.6 F (37 C)] 98.4 F (36.9 C) (06/10 0520) Pulse Rate:  [58-70] 70 (06/10 0520) Resp:  [18-19] 18 (06/10 0520) BP: (137-166)/(41-68) 137/41 mmHg (06/10 0520) SpO2:  [94 %-100 %] 100 % (06/10 0520) Last BM Date: 10/08/14  Intake/Output from previous day: 06/09 0701 - 06/10 0700 In: 1140 [P.O.:1140] Out: 725 [Urine:650; Drains:25; Stool:50] Intake/Output this shift:    PE: Gen:  Alert, NAD, pleasant Abd: Soft, mild distension, tenderness in RLQ over heparin hematomas, +BS, no HSM, midline wound with wound vac in place, ostomy purple with redish brown tissue centrally, ostomy has flatus and liquid brown stools in bag   Lab Results:   Recent Labs  10/08/14 0520 10/09/14 0413  WBC 11.3* 11.3*  HGB 8.8* 8.8*  HCT 29.2* 29.8*  PLT 205 234   BMET  Recent Labs  10/08/14 0520 10/09/14 0413  NA 143 145  K 4.1 4.5  CL 107 112*  CO2 25 20*  GLUCOSE 66 62*  BUN 19 18  CREATININE 1.26* 1.34*  CALCIUM 8.1* 8.2*   PT/INR No results for input(s): LABPROT, INR in the last 72 hours. CMP     Component Value Date/Time   NA 145 10/09/2014 0413   NA 145* 01/17/2013 1703   K 4.5 10/09/2014 0413   CL 112* 10/09/2014 0413   CO2 20* 10/09/2014 0413   GLUCOSE 62* 10/09/2014 0413   GLUCOSE 82 01/17/2013 1703   BUN 18 10/09/2014 0413   BUN 26 01/17/2013 1703   CREATININE 1.34* 10/09/2014 0413   CREATININE 1.69* 11/05/2012 1541   CALCIUM 8.2* 10/09/2014 0413   PROT 7.2 09/29/2014 1107   PROT 6.5 01/17/2013 1703   ALBUMIN 2.2* 10/03/2014 0309   AST 22 09/29/2014 1107    ALT 18 09/29/2014 1107   ALKPHOS 77 09/29/2014 1107   BILITOT 0.3 09/29/2014 1107   GFRNONAA 40* 10/09/2014 0413   GFRNONAA 32* 11/05/2012 1541   GFRAA 47* 10/09/2014 0413   GFRAA 36* 11/05/2012 1541   Lipase     Component Value Date/Time   LIPASE 29 09/29/2014 1108       Studies/Results: No results found.  Anti-infectives: Anti-infectives    Start     Dose/Rate Route Frequency Ordered Stop   10/07/14 1800  cefUROXime (CEFTIN) tablet 500 mg     500 mg Oral 2 times daily with meals 10/07/14 1203 10/12/14 1759   10/03/14 1530  cefTRIAXone (ROCEPHIN) 1 g in dextrose 5 % 50 mL IVPB - Premix  Status:  Discontinued     1 g 100 mL/hr over 30 Minutes Intravenous Every 24 hours 10/03/14 1454 10/07/14 1203   10/02/14 2200  Levofloxacin (LEVAQUIN) IVPB 250 mg  Status:  Discontinued     250 mg 50 mL/hr over 60 Minutes Intravenous Every 24 hours 10/01/14 2105 10/02/14 2006   10/02/14 2200  Levofloxacin (LEVAQUIN) IVPB 250 mg  Status:  Discontinued     250 mg 50 mL/hr over 60 Minutes Intravenous Every 24 hours 10/02/14 1000 10/03/14 1454   10/02/14 0800  clindamycin (CLEOCIN) IVPB 900 mg     900 mg 100 mL/hr over 30 Minutes Intravenous To [facility] 10/01/14 1238 10/02/14 0933   10/02/14 0800  gentamicin (GARAMYCIN) IVPB 100 mg     100 mg 200 mL/hr over 30 Minutes Intravenous To [facility] 10/01/14 1238 10/02/14 0938   10/01/14 2200  levofloxacin (LEVAQUIN) IVPB 500 mg     500 mg 100 mL/hr over 60 Minutes Intravenous  Once 10/01/14 2059 10/02/14 0104       Assessment/Plan Large bowel obstruction secondary to sigmoid colon stricture and colovaginal fistula  POD#8 exploratory laparotomy with sigmoid colectomy, colovaginal fistula takedown and colostomy--Dr. Brantley Stage -Tolerating clears better today, add ensure, she's still burping and belching, but N/V resolved.   -PT eval -stoma is purple, but viable, monitor, stool output -VAC in place, TTS ID-on ceftin for  UTI. WBC stable 11K yesterday VTE prophylaxis-SCD/heparin Pulmonary-hypoxia has resolved, CXR ok. Has COPD. Pulling 725ml on IS which is improved.  FEN-Clears, on orals and IV prn pain meds, Cr. Went up to 1.34 yesterday Disp-Await better bowel function before advancing diet    LOS: 11 days    Alexa Key 10/10/2014, 7:51 AM Pager: [phone]

## 2014-10-10 NOTE — Progress Notes (Signed)
Physical Therapy Treatment Patient Details Name: Alexa Key MRN: [id number] DOB: July 19, 1950 Today's Date: 10/10/2014    History of Present Illness Patient is a 64 y/o female s/p exploratory laparotomy with sigmoid colectomy, colovaginal fistula takedown and colostomy 6/2. PMH includes CAD,PVD, CVA, HTN,COPD, tobacco use, fibromyalgia and DM.    PT Comments    Patient is progressing towards physical therapy goals, ambulating up to 80 feet today with min guard assist, required 2 standing rest breaks. SpO2 96% with 2L supplemental O2. Easily fatigued with minimal dyspnea. Tolerated therapeutic exercises well. Remains very motivated to improve her functional independence. Patient will continue to benefit from skilled physical therapy services to further improve independence with functional mobility.    Follow Up Recommendations  SNF;Supervision/Assistance - 24 hour     Equipment Recommendations  Other (comment) (defer to next venue)    Recommendations for Other Services       Precautions / Restrictions Precautions Precautions: Fall Restrictions Weight Bearing Restrictions: No    Mobility  Bed Mobility Overal bed mobility: Needs Assistance Bed Mobility: Supine to Sit     Supine to sit: Min assist     General bed mobility comments: Min assist for trucal support with transistion from supine to sit. Able to scoot to edge of bed. Encouraged use of rail.  Transfers Overall transfer level: Needs assistance Equipment used: Rolling walker (2 wheeled) Transfers: Sit to/from Stand Sit to Stand: Min guard         General transfer comment: Close guard for safety. Requires extra time. VC for hand placement. VC for pursed lip breathing and energy conservation techniques.  Ambulation/Gait Ambulation/Gait assistance: Min guard Ambulation Distance (Feet): 80 Feet Assistive device: Rolling walker (2 wheeled) Gait Pattern/deviations: Step-through pattern;Decreased stride  length Gait velocity: slow Gait velocity interpretation: Below normal speed for age/gender General Gait Details: Tolerated wean of supplemental O2 from 3 to 2L while ambulating, SpO2 maintained 96%. Required 2 standing rest breaks to complete distance. States she feels fatigued today. Assist to manage wound vac and O2.   Stairs            Wheelchair Mobility    Modified Rankin (Stroke Patients Only)       Balance                                    Cognition Arousal/Alertness: Awake/alert Behavior During Therapy: WFL for tasks assessed/performed Overall Cognitive Status: Within Functional Limits for tasks assessed                      Exercises General Exercises - Lower Extremity Ankle Circles/Pumps: AROM;Both;10 reps;Seated Gluteal Sets: Strengthening;Both;10 reps;Seated Long Arc Quad: Strengthening;Both;10 reps;Seated Hip Flexion/Marching: Strengthening;Both;10 reps    General Comments        Pertinent Vitals/Pain Pain Assessment: 0-10 Pain Score:  ("I'm doing okay" no value given) Pain Intervention(s): Monitored during session    Home Living                      Prior Function            PT Goals (current goals can now be found in the care plan section) Acute Rehab PT Goals Patient Stated Goal: to live independently again PT Goal Formulation: With patient/family Time For Goal Achievement: 10/20/14 Potential to Achieve Goals: Good Progress towards PT goals: Progressing toward goals    Frequency  Min 3X/week    PT Plan Current plan remains appropriate    Co-evaluation             End of Session Equipment Utilized During Treatment: Gait belt;Oxygen Activity Tolerance: Patient limited by fatigue Patient left: with call bell/phone within reach;Other (comment) (sitting EOB)     Time: 0867-6195 PT Time Calculation (min) (ACUTE ONLY): 24 min  Charges:  $Gait Training: 8-22 mins $Therapeutic Exercise: 8-22  mins                    G Codes:      Ellouise Newer 2014-10-16, 1:43 PM [name] [place], [place]

## 2014-10-10 NOTE — Progress Notes (Signed)
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 Patient had decreased appetite. Patient was scheduled for cardiac catheterization on 10/03/2014 for medical clearance for surgical intervention of fistula or stricture. Patient was admitted and general surgery consulted as patient had presented with a colonic obstruction. Cardiology was consulted and patient was scheduled for cardiac catheterization. On the day of cardiac catheterization patient was noted to be in acute renal failure with a creatinine going up as high as 2.59. Cardiac catheterization was canceled.  Patient and family accepted the risk for surgery and patient subsequently underwent a sigmoid colon colectomy with end colostomy takedown of colovaginal fistula. Patient was placed in the step down unit initially. Patient was noted to have a significant leukocytosis. Patient was pancultured and noted to have a Escherichia coli UTI. Patient was initially placed empirically on IV Levaquin however urine cultures were resistant to fluoroquinolones and antibiotics were switched to IV Rocephin. Patient improved clinically has been started on clear liquids per general surgery and antibiotics changed to oral Ceftin.  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 Brantley Stage 10/02/2014.  -Colostomy with stool. Per general surgery. -KUB with obstruction, per surgery probably ileus.  -nausea improved. Tolerating clear. Plan to advance diet to full liquid  tonight.  -She will need IV pain medications for dressing changes.   2 coronary artery disease with intermediate risk Myoview Patient denies any chest or no shortness of breath. Patient had an abnormal Myoview and was subsequently scheduled for cardiac catheterization which was to be done, however due to worsening renal function cardiac catheterization has been canceled. Patient will likely need to follow-up with cardiology as outpatient.   3 Acute on chronic kidney disease III ( baseline creatinine 1.2-1.5) Differential includes prerenal versus renal secondary to ATN. Patient with recent CT scan done with contrast. Patient also with small bowel obstruction with several episodes of emesis and nausea with poor oral intake. Renal function slowly trending down currently at 1..3 from 2.59.  FENa = 0.2%. Renal ultrasound negative for hydronephrosis.  NSL.  Repeat renal function in am.   4 hypertension IV hydralazine PRN Continue with metoprolol, norvasc, cozaar.   5 COPD Stable. Continue Symbicort and spiriva  6 colovaginal fistula Patient status post sigmoid colon colectomy with end colostomy takedown of colovaginal fistula 10/02/2014 per Dr. Brantley Stage. Per general surgery.  [facility] UTI Patient with improvement with dysuria. Urine cultures with greater than 100,000 Escherichia coli. Continue with Ceftin.  antibiotics day 6/7.  8 Abnormal chest x-ray Chest x-ray with concerns for possible pneumonia. Patient afebrile. Patient does have a leukocytosis which is trending down. Repeated  CXR with improving atelectasis and mild interstitial edema pattern.  Continue IS, flutter valve.   9 leukocytosis Likely secondary to problem to UTI.  and reactive secondary to recent surgery.  Urine cultures with Escherichia coli. Blood cultures with no growth to date. D7/7. Now on  Ceftin.   10 SVT Patient noted to have SVT 2 nights ago. No further SVT. Patient asymptomatic.  Continue with metoprolol.  k  at 4.1.   11 tobacco abuse Nicotine  patch. Tobacco cessation.  12 prophylaxis PPI for GI prophylaxis. Lovenox for DVT prophylaxis.   Hypoglycemia. received amp D 50. Started on clear diet Resolved.   Code Status: Full Family Communication: Updated patient.  Disposition Plan: Remain inpatient. Per general surgery.   Consultants:  Gen. surgery: Dr. Brantley Stage 09/30/2014  Cardiology: Dr. Ellyn Hack 09/30/2014  Procedures:  CT abdomen and pelvis 09/29/2014  Renal ultrasound 10/01/2014  Chest x-ray 10/03/2014, 10/05/14 Sigmoid colon colectomy with in colostomy takedown of colovaginal fistula per Dr. Brantley Stage 10/02/2014  Antibiotics:  IV Levaquin 10/01/2014 >>> 10/03/2014  IV Rocephin 10/03/2014>>>>> 10/07/2014  Oral Ceftin 10/07/2014  HPI/Subjective: Feeling well, tolerating clear diet   Objective: Filed Vitals:   10/10/14 1348  BP: 145/53  Pulse: 71  Temp: 98.4 F (36.9 C)  Resp: 18    Intake/Output Summary (Last 24 hours) at 10/10/14 1536 Last data filed at 10/10/14 1300  Gross per 24 hour  Intake    920 ml  Output   1125 ml  Net   -205 ml   Filed Weights   09/29/14 1900 09/30/14 0534 10/01/14 0602  Weight: 65.409 kg (144 lb 3.2 oz) 66.497 kg (146 lb 9.6 oz) 66.679 kg (147 lb)    Exam:   General:  NAD.   Cardiovascular: RRR  Respiratory: CTAB anterior lung fields  Abdomen: Soft,. some bowel sounds, Colostomy with stool. Midline incision  With wound vac  Musculoskeletal: No clubbing cyanosis or edema.  Data Reviewed: Basic Metabolic Panel:  Recent Labs Lab 10/04/14 0325 10/05/14 0303 10/06/14 0309 10/07/14 0526 10/08/14 0520 10/09/14 0413  NA 149* 147* 145 145 143 145  K 4.0 4.0 3.9 3.4* 4.1 4.5  CL 114* 109 109 107 107 112*  CO2 25 27 27 28 25  20*  GLUCOSE 127* 126* 111* 75 66 62*  BUN 46* 33* 23* 17 19 18   CREATININE 2.13* 1.68* 1.37* 1.33* 1.26* 1.34*  CALCIUM 7.5* 8.0* 8.2* 8.3* 8.1* 8.2*  MG 2.2 2.2  --  2.0  --   --    Liver  Function Tests: No results for input(s): AST, ALT, ALKPHOS, BILITOT, PROT, ALBUMIN in the last 168 hours. No results for input(s): LIPASE, AMYLASE in the last 168 hours. No results for input(s): AMMONIA in the last 168 hours. CBC:  Recent Labs Lab 10/04/14 0325 10/05/14 0303 10/06/14 0309 10/07/14 0526 10/08/14 0520 10/09/14 0413  WBC 14.7* 15.3* 14.3* 11.5* 11.3* 11.3*  NEUTROABS 12.1*  --   --   --   --   --   HGB 8.9* 8.5* 8.5* 9.1* 8.8* 8.8*  HCT 28.7* 28.3* 27.5* 29.7* 29.2* 29.8*  MCV 98.3 99.6 98.9 98.3 100.3* 103.1*  PLT 222 211 218 203 205 234   Cardiac Enzymes: No results for input(s): CKTOTAL, CKMB, CKMBINDEX, TROPONINI in the last 168 hours. BNP (last 3 results) No results for input(s): BNP in the last 8760 hours.  ProBNP (last 3 results) No results for input(s): PROBNP in the last 8760 hours.  CBG: No results for input(s): GLUCAP in the last 168 hours.  Recent Results (from the past 240 hour(s))  Culture, Urine     Status: None   Collection Time: 09/30/14  5:47 PM  Result Value Ref Range Status   Specimen Description URINE, RANDOM  Final   Special Requests NONE  Final   Colony Count   Final    >=100,000 COLONIES/ML Performed at [facility]    Culture   Final    Multiple bacterial morphotypes present,  none predominant. Suggest appropriate recollection if clinically indicated. Performed at [facility]    Report Status 10/01/2014 FINAL  Final  Culture, Urine     Status: None   Collection Time: 10/01/14  2:31 PM  Result Value Ref Range Status   Specimen Description URINE, RANDOM  Final   Special Requests NONE  Final   Colony Count   Final    >=100,000 COLONIES/ML Performed at [facility]    Culture   Final    ESCHERICHIA COLI Performed at [facility]    Report Status 10/03/2014 FINAL  Final   Organism ID, Bacteria ESCHERICHIA COLI  Final      Susceptibility   Escherichia coli - MIC*    AMPICILLIN >=32  RESISTANT Resistant     CEFAZOLIN 16 INTERMEDIATE Intermediate     CEFTRIAXONE <=1 SENSITIVE Sensitive     CIPROFLOXACIN >=4 RESISTANT Resistant     GENTAMICIN <=1 SENSITIVE Sensitive     LEVOFLOXACIN >=8 RESISTANT Resistant     NITROFURANTOIN <=16 SENSITIVE Sensitive     TOBRAMYCIN <=1 SENSITIVE Sensitive     TRIMETH/SULFA <=20 SENSITIVE Sensitive     PIP/TAZO <=4 SENSITIVE Sensitive     * ESCHERICHIA COLI  Surgical pcr screen     Status: None   Collection Time: 10/01/14 10:28 PM  Result Value Ref Range Status   MRSA, PCR NEGATIVE NEGATIVE Final   Staphylococcus aureus NEGATIVE NEGATIVE Final    Comment:        The Xpert SA Assay (FDA approved for NASAL specimens in patients over 95 years of age), is one component of a comprehensive surveillance program.  Test performance has been validated by [facility] for patients greater than or equal to 47 year old. It is not intended to diagnose infection nor to guide or monitor treatment.      Studies: No results found.  Scheduled Meds: . amLODipine  10 mg Oral Daily  . aspirin  325 mg Oral Daily  . budesonide-formoterol  2 puff Inhalation BID  . cefUROXime  500 mg Oral BID WC  . enoxaparin (LOVENOX) injection  40 mg Subcutaneous Daily  . feeding supplement (RESOURCE BREEZE)  1 Container Oral TID BM  . lip balm  1 application Topical BID  . losartan  100 mg Oral Daily  . metoprolol tartrate  12.5 mg Oral BID  . nicotine  7 mg Transdermal Daily  . pantoprazole  40 mg Oral Daily  . pneumococcal 23 valent vaccine  0.5 mL Intramuscular Tomorrow-1000  . saccharomyces boulardii  250 mg Oral BID  . 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 [facility] Pager [phone]. If 7PM-7AM, please contact night-coverage at [email or web address], password [facility] 10/10/2014, 3:36 PM  LOS: 11 days

## 2014-10-10 NOTE — Clinical Social Work Note (Signed)
CSW received phone call from [facility] who said they can take patient once she is medically ready and discharge orders have been received.  CSW will continue to follow patient throughout discharge, CSW informed patient that [facility] Key can take her.  Jones Broom. [place], MSW, [place] 10/10/2014 4:54 PM

## 2014-10-10 NOTE — Consult Note (Addendum)
WOC follow-up: Reviewed pouching routines and ordering supplies. Pt states she plans to have further assistance with pouching application and emptying at a SNF after discharge.  Educational materials left in room and extra supplies are at the bedside for staff nurse use. Pt denies further questions at this time. Placed on [place] discharge program: Daniel MSN, RN, [place], [place], [place]

## 2014-10-10 NOTE — Progress Notes (Signed)
Initial Nutrition Assessment  DOCUMENTATION CODES:  Not applicable  INTERVENTION:  -Resource Breeze po TID, each supplement provides 250 kcal and 9 grams of protein -RD to follow for diet advancement -Consider initiation of supplemental TPN if pt is unable to advance diet above clear liquids  NUTRITION DIAGNOSIS:  Inadequate oral intake related to altered GI function as evidenced by other (see comment) (prolonged NPO/clear liquid status).   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 Patient had decreased appetite. Patient was scheduled for cardiac catheterization on 10/03/2014 for medical clearance for surgical intervention of fistula or stricture. Patient was admitted and general surgery consulted as patient had presented with a colonic obstruction.  Pt s/p exploratory laparotomy with sigmoid colectomy, colovaginal fistula takedown and colostomy on 10/02/14.   Pt has been NPO/clear liquids for the majority of her 11 day hospital stay. Noted that pt was advanced to a full liquid diet on 10/07/14, however, was made NPO later that day due to nausea and vomiting. Pt was advanced to a clear liquid diet on 10/09/14.   Spoke with pt who was sitting in recliner at time of visit. She reports good appetite PTA, however, is consuming minimal liquids at this time. She reports she consumed a few bites of jello and a glass of juice this morning. She reports she tried to drink coffee this AM but it aggravated her stomach.   She denies any recent weight loss.   Noted  10 ml colostomy output on 10/10/14.   Encouraged pt to consume her meals. She is amenable to try [employer].  Case discussed with RN. She reports that pt was ordered Ensure, but noted pt was not interested in drinking it at time of RD visit. RD interventions discussed. Per MD notes, awaiting better bowel function prior to advancing diet. RN reports discharge disposition is to SNF.   Height:  Ht Readings from Last 1 Encounters:  09/29/14 5\' 1"  (1.549 m)    Weight:  Wt Readings from Last 1 Encounters:  10/01/14 147 lb (66.679 kg)    Ideal Body Weight:  47.7 kg  Wt Readings from Last 10 Encounters:  10/01/14 147 lb (66.679 kg)  09/22/14 143 lb 6.4 oz (65.046 kg)  09/10/14 142 lb (64.411 kg)  08/27/14 142 lb (64.411 kg)  08/11/14 142 lb (64.411 kg)  08/01/14 136 lb (61.689 kg)  07/21/14 137 lb 9.6 oz (62.415 kg)  07/11/14 140 lb (63.504 kg)  05/19/14 138 lb 3.2 oz (62.687 kg)  05/08/14 137 lb 12.8 oz (62.506 kg)    BMI:  Body mass index is 27.79 kg/(m^2). Overweight  Estimated Nutritional Needs:  Kcal:  1700-1900  Protein:  85-95 grams  Fluid:  1.7-1.9 L  Skin:  Wound (see comment) (open rt arm wound, back incision, closed abdominal incision, abdominal wound vac)  Diet Order:  Diet clear liquid Room service appropriate?: Yes; Fluid consistency:: Thin  EDUCATION NEEDS:  Education needs addressed   Intake/Output Summary (Last 24 hours) at 10/10/14 1419 Last data filed at 10/10/14 1300  Gross per 24 hour  Intake  1400 ml  Output   1125 ml  Net    275 ml    Last BM:  10/10/14  Laquita Harlan A. Jimmye Norman, RD, LDN, CDE Pager: [phone] After hours Pager: [phone]

## 2014-10-11 DIAGNOSIS — N3000 Acute cystitis without hematuria: Secondary | ICD-10-CM

## 2014-10-11 LAB — BASIC METABOLIC PANEL
Anion gap: 9 (ref 5–15)
BUN: 11 mg/dL (ref 6–20)
CO2: 25 mmol/L (ref 22–32)
Calcium: 8.1 mg/dL — ABNORMAL LOW (ref 8.9–10.3)
Chloride: 111 mmol/L (ref 101–111)
Creatinine, Ser: 1.18 mg/dL — ABNORMAL HIGH (ref 0.44–1.00)
GFR, EST AFRICAN AMERICAN: 54 mL/min — AB (ref >60)
GFR, EST NON AFRICAN AMERICAN: 47 mL/min — AB (ref >60)
Glucose, Bld: 91 mg/dL (ref 65–99)
Potassium: 3.9 mmol/L (ref 3.5–5.1)
Sodium: 145 mmol/L (ref 135–145)

## 2014-10-11 LAB — CBC
HCT: 26.7 % — ABNORMAL LOW (ref 36.0–46.0)
Hemoglobin: 8.2 g/dL — ABNORMAL LOW (ref 12.0–15.0)
MCH: 30.9 pg (ref 26.0–34.0)
MCHC: 30.7 g/dL (ref 30.0–36.0)
MCV: 100.8 fL — AB (ref 78.0–100.0)
Platelets: 234 10*3/uL (ref 150–400)
RBC: 2.65 MIL/uL — AB (ref 3.87–5.11)
RDW: 15.3 % (ref 11.5–15.5)
WBC: 10.9 10*3/uL — ABNORMAL HIGH (ref 4.0–10.5)

## 2014-10-11 LAB — MAGNESIUM: MAGNESIUM: 2.1 mg/dL (ref 1.7–2.4)

## 2014-10-11 MED ORDER — OXYCODONE HCL 5 MG PO TABS
5.0000 mg | ORAL_TABLET | ORAL | Status: DC | PRN
  Administered 2014-10-11 – 2014-10-18 (×6): 10 mg via ORAL
  Filled 2014-10-11 (×6): qty 2

## 2014-10-11 NOTE — Progress Notes (Signed)
9 Days Post-Op  Subjective: Tolerated fulls. Likes the [employer]. Drinks several a day. No n/v.   Objective: Vital signs in last 24 hours: Temp:  [98.1 F (36.7 C)-98.7 F (37.1 C)] 98.7 F (37.1 C) (06/11 0645) Pulse Rate:  [56-71] 56 (06/11 0645) Resp:  [16-18] 17 (06/11 0645) BP: (124-145)/(40-57) 124/49 mmHg (06/11 0645) SpO2:  [96 %-100 %] 97 % (06/11 0645) Last BM Date: 10/10/14  Intake/Output from previous day: 06/10 0701 - 06/11 0700 In: 2360 [P.O.:2360] Out: 1075 [Urine:650; Drains:75; Stool:350] Intake/Output this shift:    Alert, nontoxic cta ant Obese, soft, wound vac intact; ostomy viable, +air in bag  Lab Results:   Recent Labs  10/09/14 0413 10/11/14 0451  WBC 11.3* 10.9*  HGB 8.8* 8.2*  HCT 29.8* 26.7*  PLT 234 234   BMET  Recent Labs  10/09/14 0413 10/11/14 0451  NA 145 145  K 4.5 3.9  CL 112* 111  CO2 20* 25  GLUCOSE 62* 91  BUN 18 11  CREATININE 1.34* 1.18*  CALCIUM 8.2* 8.1*   PT/INR No results for input(s): LABPROT, INR in the last 72 hours. ABG No results for input(s): PHART, HCO3 in the last 72 hours.  Invalid input(s): PCO2, PO2  Studies/Results: No results found.  Anti-infectives: Anti-infectives    Start     Dose/Rate Route Frequency Ordered Stop   10/07/14 1800  cefUROXime (CEFTIN) tablet 500 mg     500 mg Oral 2 times daily with meals 10/07/14 1203 10/12/14 1759   10/03/14 1530  cefTRIAXone (ROCEPHIN) 1 g in dextrose 5 % 50 mL IVPB - Premix  Status:  Discontinued     1 g 100 mL/hr over 30 Minutes Intravenous Every 24 hours 10/03/14 1454 10/07/14 1203   10/02/14 2200  Levofloxacin (LEVAQUIN) IVPB 250 mg  Status:  Discontinued     250 mg 50 mL/hr over 60 Minutes Intravenous Every 24 hours 10/01/14 2105 10/02/14 2006   10/02/14 2200  Levofloxacin (LEVAQUIN) IVPB 250 mg  Status:  Discontinued     250 mg 50 mL/hr over 60 Minutes Intravenous Every 24 hours 10/02/14 1000 10/03/14 1454   10/02/14 0800  clindamycin  (CLEOCIN) IVPB 900 mg     900 mg 100 mL/hr over 30 Minutes Intravenous To ShortStay Surgical 10/01/14 1238 10/02/14 0933   10/02/14 0800  gentamicin (GARAMYCIN) IVPB 100 mg     100 mg 200 mL/hr over 30 Minutes Intravenous To ShortStay Surgical 10/01/14 1238 10/02/14 0938   10/01/14 2200  levofloxacin (LEVAQUIN) IVPB 500 mg     500 mg 100 mL/hr over 60 Minutes Intravenous  Once 10/01/14 2059 10/02/14 0104      Assessment/Plan: s/p Procedure(s): EXPLORATORY LAPAROTOMY WITH PARTIAL COLECTOMY/COLOSTOMY (N/A) Large bowel obstruction secondary to sigmoid colon stricture and colovaginal fistula  POD#9 exploratory laparotomy with sigmoid colectomy, colovaginal fistula takedown and colostomy--Dr. Cornett -Tolerating fulls. Likes protein shakes (resource); cont [employer]. Will adv to soft diet for lunch  -PT eval -stoma is purple, but viable, monitor, stool output -VAC in place, TTS ID-on ceftin for UTI. WBC stable 11K . No fever VTE prophylaxis-SCD/heparin Pulmonary-hypoxia has resolved, CXR ok. Has COPD. Pulling 726ml on IS which is improved.  FEN-fulls, adv to soft, on orals and IV prn pain meds, Cr. Went up to 1.2 today Disp-if does well today, should be ready from our standpoint to go to [facility] Sunday  Leighton Ruff. Redmond Pulling, MD, FACS General, Bariatric, & Minimally Invasive Surgery [facility], [place]  LOS: 12 days    Gayland Curry 10/11/2014

## 2014-10-11 NOTE — Progress Notes (Signed)
CSW reviewed Pt chart.   Per MD today's MD Pt may be ready to d/c to facility tomorrow. CSW contacted facility and Pt will not be able to return to facility until Monday 10/13/2014 per weekend supervisor at [facility]. This decision was based on no access to medication and Pt not in system at facility.   CSW will continue to follow Pt for further needs.    [facility]  2S, 64M,3S, 5N, 6N [phone]

## 2014-10-11 NOTE — Progress Notes (Signed)
0300 Noted patient to now be in a junctional rhythm heart rate 57. Central monitoring notified and it was determine that patient had went in junctional rhythm about 2200. Will continue to monitor.

## 2014-10-11 NOTE — Progress Notes (Signed)
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 Patient had decreased appetite. Patient was scheduled for cardiac catheterization on 10/03/2014 for medical clearance for surgical intervention of fistula or stricture. Patient was admitted and general surgery consulted as patient had presented with a colonic obstruction. Cardiology was consulted and patient was scheduled for cardiac catheterization. On the day of cardiac catheterization patient was noted to be in acute renal failure with a creatinine going up as high as 2.59. Cardiac catheterization was canceled.  Patient and family accepted the risk for surgery and patient subsequently underwent a sigmoid colon colectomy with end colostomy takedown of colovaginal fistula. Patient was placed in the step down unit initially. Patient was noted to have a significant leukocytosis. Patient was pancultured and noted to have a Escherichia coli UTI. Patient was initially placed empirically on IV Levaquin however urine cultures were resistant to fluoroquinolones and antibiotics were switched to IV Rocephin. Patient improved clinically has been started on clear liquids per general surgery and antibiotics changed to oral Ceftin.  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 Brantley Stage 10/02/2014.  -Colostomy with stool. Per general surgery. -KUB with obstruction, per surgery probably ileus.  Tolerating current diet. Plan to advance diet to soft today    2  coronary artery disease with intermediate risk Myoview Patient denies any chest or no shortness of breath. Patient had an abnormal Myoview and was subsequently scheduled for cardiac catheterization which was to be done, however due to worsening renal function cardiac catheterization has been canceled. Patient will likely need to follow-up with cardiology as outpatient.   3 Acute on chronic kidney disease III ( baseline creatinine 1.2-1.5) Differential includes prerenal versus renal secondary to ATN. Patient with recent CT scan done with contrast. Patient also with small bowel obstruction with several episodes of emesis and nausea with poor oral intake. Renal function slowly trending down currently at 1..3 from 2.59.  FENa = 0.2%. Renal ultrasound negative for hydronephrosis.  NSL.    4 hypertension IV hydralazine PRN Continue with metoprolol, norvasc, cozaar.   5 COPD Stable. Continue Symbicort and spiriva  6 colovaginal fistula Patient status post sigmoid colon colectomy with end colostomy takedown of colovaginal fistula 10/02/2014 per Dr. Brantley Stage. Per general surgery.  Gutierrez UTI Patient with improvement with dysuria. Urine cultures with greater than 100,000 Escherichia coli. Continue with Ceftin.  antibiotics day 7/7. Last dose of antibiotics today.   8 Abnormal chest x-ray Chest x-ray with concerns for possible pneumonia. Patient afebrile. Patient does have a leukocytosis which is trending down. Repeated  CXR with improving atelectasis and mild interstitial edema pattern.  Continue IS, flutter valve.   9 leukocytosis Likely secondary to problem to UTI.  and reactive secondary to recent surgery.  Urine cultures with Escherichia coli. Blood cultures with no growth to date. D7/7.  10 SVT Patient noted to have SVT 2 nights ago. No further SVT. Patient asymptomatic.  Continue with metoprolol.  k at 4.1.   11 tobacco abuse Nicotine patch. Tobacco cessation.  12 prophylaxis PPI  for GI prophylaxis. Lovenox for DVT prophylaxis.  Hypoglycemia. received amp D 50. Started on clear diet Resolved.   Code Status: Full Family Communication: Updated patient.  Disposition Plan: SNF when bed available   Consultants:  Gen. surgery: Dr. Brantley Stage 09/30/2014  Cardiology: Dr. Ellyn Hack 09/30/2014  Procedures:  CT abdomen and pelvis 09/29/2014  Renal ultrasound 10/01/2014  Chest x-ray 10/03/2014, 10/05/14 Sigmoid colon colectomy with in colostomy takedown of colovaginal fistula per Dr. Brantley Stage 10/02/2014  Antibiotics:  IV Levaquin 10/01/2014 >>> 10/03/2014  IV Rocephin 10/03/2014>>>>> 10/07/2014  Oral Ceftin 10/07/2014  HPI/Subjective: Tolerating diet. Feeling ok   Objective: Filed Vitals:   10/11/14 1334  BP: 122/58  Pulse: 58  Temp: 98.7 F (37.1 C)  Resp: 18    Intake/Output Summary (Last 24 hours) at 10/11/14 1609 Last data filed at 10/11/14 1327  Gross per 24 hour  Intake   2760 ml  Output   1175 ml  Net   1585 ml   Filed Weights   09/29/14 1900 09/30/14 0534 10/01/14 0602  Weight: 65.409 kg (144 lb 3.2 oz) 66.497 kg (146 lb 9.6 oz) 66.679 kg (147 lb)    Exam:   General:  NAD.   Cardiovascular: RRR  Respiratory: CTAB anterior lung fields  Abdomen: Soft,. some bowel sounds, Colostomy with stool. Midline incision  With wound vac  Musculoskeletal: No clubbing cyanosis or edema.  Data Reviewed: Basic Metabolic Panel:  Recent Labs Lab 10/05/14 0303 10/06/14 0309 10/07/14 0526 10/08/14 0520 10/09/14 0413 10/11/14 0451  NA 147* 145 145 143 145 145  K 4.0 3.9 3.4* 4.1 4.5 3.9  CL 109 109 107 107 112* 111  CO2 27 27 28 25  20* 25  GLUCOSE 126* 111* 75 66 62* 91  BUN 33* 23* 17 19 18 11   CREATININE 1.68* 1.37* 1.33* 1.26* 1.34* 1.18*  CALCIUM 8.0* 8.2* 8.3* 8.1* 8.2* 8.1*  MG 2.2  --  2.0  --   --  2.1   Liver Function Tests: No results for input(s): AST, ALT, ALKPHOS, BILITOT, PROT, ALBUMIN in the last 168 hours. No results  for input(s): LIPASE, AMYLASE in the last 168 hours. No results for input(s): AMMONIA in the last 168 hours. CBC:  Recent Labs Lab 10/06/14 0309 10/07/14 0526 10/08/14 0520 10/09/14 0413 10/11/14 0451  WBC 14.3* 11.5* 11.3* 11.3* 10.9*  HGB 8.5* 9.1* 8.8* 8.8* 8.2*  HCT 27.5* 29.7* 29.2* 29.8* 26.7*  MCV 98.9 98.3 100.3* 103.1* 100.8*  PLT 218 203 205 234 234   Cardiac Enzymes: No results for input(s): CKTOTAL, CKMB, CKMBINDEX, TROPONINI in the last 168 hours. BNP (last 3 results) No results for input(s): BNP in the last 8760 hours.  ProBNP (last 3 results) No results for input(s): PROBNP in the last 8760 hours.  CBG: No results for input(s): GLUCAP in the last 168 hours.  Recent Results (from the past 240 hour(s))  Surgical pcr screen     Status: None   Collection Time: 10/01/14 10:28 PM  Result Value Ref Range Status   MRSA, PCR NEGATIVE NEGATIVE Final   Staphylococcus aureus NEGATIVE NEGATIVE Final    Comment:        The Xpert SA Assay (FDA approved for NASAL specimens in patients over 35 years of age), is one component of a comprehensive surveillance program.  Test performance has been validated by [facility] for patients greater than or equal to 45 year old. It is not intended to diagnose infection nor to guide or monitor treatment.      Studies: No results found.  Scheduled Meds: . amLODipine  10 mg Oral Daily  . aspirin  325 mg Oral Daily  . budesonide-formoterol  2 puff Inhalation BID  . cefUROXime  500 mg Oral BID WC  . enoxaparin (LOVENOX) injection  40 mg Subcutaneous Daily  . feeding supplement (RESOURCE BREEZE)  1 Container Oral TID BM  . lip balm  1 application Topical BID  . losartan  100 mg Oral Daily  . metoprolol tartrate  12.5 mg Oral BID  . nicotine  7 mg Transdermal Daily  . pantoprazole  40 mg Oral Daily  . pneumococcal 23 valent vaccine  0.5 mL Intramuscular Tomorrow-1000  . saccharomyces boulardii  250 mg Oral BID  .  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 [facility] Pager [phone]. If 7PM-7AM, please contact night-coverage at [email or web address], password [facility] 10/11/2014, 4:09 PM  LOS: 12 days

## 2014-10-12 LAB — CBC
HEMATOCRIT: 27.6 % — AB (ref 36.0–46.0)
Hemoglobin: 8.1 g/dL — ABNORMAL LOW (ref 12.0–15.0)
MCH: 29.9 pg (ref 26.0–34.0)
MCHC: 29.3 g/dL — AB (ref 30.0–36.0)
MCV: 101.8 fL — ABNORMAL HIGH (ref 78.0–100.0)
PLATELETS: 276 10*3/uL (ref 150–400)
RBC: 2.71 MIL/uL — AB (ref 3.87–5.11)
RDW: 15.6 % — ABNORMAL HIGH (ref 11.5–15.5)
WBC: 15 10*3/uL — ABNORMAL HIGH (ref 4.0–10.5)

## 2014-10-12 LAB — BASIC METABOLIC PANEL
Anion gap: 7 (ref 5–15)
BUN: 11 mg/dL (ref 6–20)
CO2: 27 mmol/L (ref 22–32)
CREATININE: 1.45 mg/dL — AB (ref 0.44–1.00)
Calcium: 8.4 mg/dL — ABNORMAL LOW (ref 8.9–10.3)
Chloride: 111 mmol/L (ref 101–111)
GFR, EST AFRICAN AMERICAN: 42 mL/min — AB (ref >60)
GFR, EST NON AFRICAN AMERICAN: 37 mL/min — AB (ref >60)
Glucose, Bld: 110 mg/dL — ABNORMAL HIGH (ref 65–99)
Potassium: 3.8 mmol/L (ref 3.5–5.1)
SODIUM: 145 mmol/L (ref 135–145)

## 2014-10-12 LAB — TROPONIN I
Troponin I: 0.03 ng/mL (ref <0.031)
Troponin I: 0.16 ng/mL — ABNORMAL HIGH (ref <0.031)
Troponin I: 0.04 ng/mL — ABNORMAL HIGH (ref <0.031)

## 2014-10-12 MED ORDER — HEPARIN BOLUS VIA INFUSION
2000.0000 [IU] | Freq: Once | INTRAVENOUS | Status: AC
Start: 2014-10-12 — End: 2014-10-12
  Administered 2014-10-12: 2000 [IU] via INTRAVENOUS
  Filled 2014-10-12: qty 2000

## 2014-10-12 MED ORDER — SUCRALFATE 1 GM/10ML PO SUSP
1.0000 g | Freq: Three times a day (TID) | ORAL | Status: DC
  Administered 2014-10-12 – 2014-10-21 (×28): 1 g via ORAL
  Filled 2014-10-12 (×34): qty 10

## 2014-10-12 MED ORDER — HEPARIN (PORCINE) IN NACL 100-0.45 UNIT/ML-% IJ SOLN
1000.0000 [IU]/h | INTRAMUSCULAR | Status: DC
  Administered 2014-10-12 (×3): 750 [IU]/h via INTRAVENOUS
  Administered 2014-10-13: 900 [IU]/h via INTRAVENOUS
  Filled 2014-10-12 (×2): qty 250

## 2014-10-12 NOTE — Progress Notes (Signed)
10 Days Post-Op  Subjective: Pt reports 1 episode of emesis after dinner. States she ate too much at 1 time (had shake and dinner tray). She felt fine afterwards and was able to eat later last night without any n/v.   Objective: Vital signs in last 24 hours: Temp:  [98.5 F (36.9 C)-98.7 F (37.1 C)] 98.6 F (37 C) (06/12 0524) Pulse Rate:  [57-70] 57 (06/12 0524) Resp:  [18] 18 (06/12 0524) BP: (122-148)/(43-58) 132/49 mmHg (06/12 0524) SpO2:  [96 %-99 %] 99 % (06/12 0524) Weight:  [69.809 kg (153 lb 14.4 oz)] 69.809 kg (153 lb 14.4 oz) (06/12 0524) Last BM Date: 10/11/14  Intake/Output from previous day: 06/11 0701 - 06/12 0700 In: 31 [P.O.:1070] Out: 1650 [Urine:825; Emesis/NG output:400; Stool:425] Intake/Output this shift:    Alert, nad Soft, obese, ostomy with air/stool in bag; intact wound vac. Very mild TTP in LUQ  Lab Results:   Recent Labs  10/11/14 0451  WBC 10.9*  HGB 8.2*  HCT 26.7*  PLT 234   BMET  Recent Labs  10/11/14 0451  NA 145  K 3.9  CL 111  CO2 25  GLUCOSE 91  BUN 11  CREATININE 1.18*  CALCIUM 8.1*   PT/INR No results for input(s): LABPROT, INR in the last 72 hours. ABG No results for input(s): PHART, HCO3 in the last 72 hours.  Invalid input(s): PCO2, PO2  Studies/Results: No results found.  Anti-infectives: Anti-infectives    Start     Dose/Rate Route Frequency Ordered Stop   10/07/14 1800  cefUROXime (CEFTIN) tablet 500 mg  Status:  Discontinued     500 mg Oral 2 times daily with meals 10/07/14 1203 10/11/14 1611   10/03/14 1530  cefTRIAXone (ROCEPHIN) 1 g in dextrose 5 % 50 mL IVPB - Premix  Status:  Discontinued     1 g 100 mL/hr over 30 Minutes Intravenous Every 24 hours 10/03/14 1454 10/07/14 1203   10/02/14 2200  Levofloxacin (LEVAQUIN) IVPB 250 mg  Status:  Discontinued     250 mg 50 mL/hr over 60 Minutes Intravenous Every 24 hours 10/01/14 2105 10/02/14 2006   10/02/14 2200  Levofloxacin (LEVAQUIN) IVPB 250 mg   Status:  Discontinued     250 mg 50 mL/hr over 60 Minutes Intravenous Every 24 hours 10/02/14 1000 10/03/14 1454   10/02/14 0800  clindamycin (CLEOCIN) IVPB 900 mg     900 mg 100 mL/hr over 30 Minutes Intravenous To ShortStay Surgical 10/01/14 1238 10/02/14 0933   10/02/14 0800  gentamicin (GARAMYCIN) IVPB 100 mg     100 mg 200 mL/hr over 30 Minutes Intravenous To ShortStay Surgical 10/01/14 1238 10/02/14 0938   10/01/14 2200  levofloxacin (LEVAQUIN) IVPB 500 mg     500 mg 100 mL/hr over 60 Minutes Intravenous  Once 10/01/14 2059 10/02/14 0104      Assessment/Plan: s/p Procedure(s): EXPLORATORY LAPAROTOMY WITH PARTIAL COLECTOMY/COLOSTOMY (N/A) Large bowel obstruction secondary to sigmoid colon stricture and colovaginal fistula  POD#10 exploratory laparotomy with sigmoid colectomy, colovaginal fistula takedown and colostomy--Dr. Brantley Stage -1 episode of emesis last night but was able to eat later without incident. Will keep on current diet. If vomits again, will need to change diet status and get imaging.  Also having ostomy function. Advised pt again she will need to eat smaller portions and not big large meals. Having no fevers. Wbc essentially normal at this point.   -cont PT -stoma is purple, but viable, monitor, stool output -VAC in place, TTS  ID- ceftin for UTI - completed. WBC stable. No fever VTE prophylaxis-SCD/heparin Pulmonary-hypoxia has resolved, CXR ok. Has COPD. Pulling 772ml on IS which is improved.  FEN-soft, on orals and IV prn pain meds Disp-if does well today, should be ready from our standpoint to go to [facility] Monday.  Advised nurse to page surgeon on call if pt vomits today - if happens again, would recommend repeat CT scan of abd/pelvis  Leighton Ruff. Redmond Pulling, MD, FACS General, Bariatric, & Minimally Invasive Surgery [facility], [place]   LOS: 13 days    Gayland Curry 10/12/2014

## 2014-10-12 NOTE — Progress Notes (Signed)
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 Patient had decreased appetite. Patient was scheduled for cardiac catheterization on 10/03/2014 for medical clearance for surgical intervention of fistula or stricture. Patient was admitted and general surgery consulted as patient had presented with a colonic obstruction. Cardiology was consulted and patient was scheduled for cardiac catheterization. On the day of cardiac catheterization patient was noted to be in acute renal failure with a creatinine going up as high as 2.59. Cardiac catheterization was canceled.  Patient and family accepted the risk for surgery and patient subsequently underwent a sigmoid colon colectomy with end colostomy takedown of colovaginal fistula. Patient was placed in the step down unit initially. Patient was noted to have a significant leukocytosis. Patient was pancultured and noted to have a Escherichia coli UTI. Patient was initially placed empirically on IV Levaquin however urine cultures were resistant to fluoroquinolones and antibiotics were switched to IV Rocephin. Patient improved clinically has been started on clear liquids per general surgery and antibiotics changed to oral Ceftin.  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 Brantley Stage 10/02/2014.  -Colostomy with stool. -vomit last night. Was not able to eat breakfast well. Complaining of chest pain, burning sensation.    2 coronary artery disease with  intermediate risk Myoview Patient had an abnormal Myoview and was subsequently scheduled for cardiac catheterization which was to be done, however due to worsening renal function cardiac catheterization has been canceled. Patient will likely need to follow-up with cardiology as outpatient.  -Chest pain, sound GI origin. Will cycle cardiac enzymes.  Start sucralfate.   3 Acute on chronic kidney disease III ( baseline creatinine 1.2-1.5) Patient with recent CT scan done with contrast. Patient also with small bowel obstruction with several episodes of emesis and nausea with poor oral intake. Renal function slowly trending down currently at 1..3 from 2.59.  FENa = 0.2%. Renal ultrasound negative for hydronephrosis.  NSL.   4 hypertension IV hydralazine PRN Continue with metoprolol, norvasc, cozaar.   5 COPD Stable. Continue Symbicort and spiriva  6 colovaginal fistula Patient status post sigmoid colon colectomy with end colostomy takedown of colovaginal fistula 10/02/2014 per Dr. Brantley Stage. Per general surgery.  [place] UTI Patient with improvement with dysuria. Urine cultures with greater than 100,000 Escherichia coli. Continue with Ceftin.  antibiotics day 7/7.  8 Abnormal chest x-ray Chest x-ray with concerns for possible pneumonia. Patient afebrile. Patient does have a leukocytosis which is trending down. Repeated  CXR with improving atelectasis and mild interstitial edema pattern.  Continue IS, flutter valve.   9 leukocytosis Likely secondary to problem to UTI.  and reactive secondary to recent surgery.  Urine cultures with Escherichia coli. Blood cultures with no growth to date. D7/7.  10 SVT Patient noted to have SVT 2 nights ago. No further SVT. Patient asymptomatic.  Continue with metoprolol.  k at 4.1.   11 tobacco abuse Nicotine patch. Tobacco cessation.  12 prophylaxis PPI for GI prophylaxis. Lovenox for DVT prophylaxis.   Hypoglycemia. received amp D 50. Started on  clear diet Resolved.   Code Status: Full  Family Communication: Updated patient.  Disposition Plan: SNF when bed available   Consultants:  Gen. surgery: Dr. Brantley Stage 09/30/2014  Cardiology: Dr. Ellyn Hack 09/30/2014  Procedures:  CT abdomen and pelvis 09/29/2014  Renal ultrasound 10/01/2014  Chest x-ray 10/03/2014, 10/05/14 Sigmoid colon colectomy with in colostomy takedown of colovaginal fistula per Dr. Brantley Stage 10/02/2014  Antibiotics:  IV Levaquin 10/01/2014 >>> 10/03/2014  IV Rocephin 10/03/2014>>>>> 10/07/2014  Oral Ceftin 10/07/2014  HPI/Subjective: Vomit last night. Complaining of chest pain, burning sensation  Objective: Filed Vitals:   10/12/14 1200  BP: 147/49  Pulse: 64  Temp:   Resp: 18    Intake/Output Summary (Last 24 hours) at 10/12/14 1255 Last data filed at 10/12/14 0836  Gross per 24 hour  Intake    830 ml  Output   1150 ml  Net   -320 ml   Filed Weights   09/30/14 0534 10/01/14 0602 10/12/14 0524  Weight: 66.497 kg (146 lb 9.6 oz) 66.679 kg (147 lb) 69.809 kg (153 lb 14.4 oz)    Exam:   General:  NAD.   Cardiovascular: RRR  Respiratory: CTAB anterior lung fields  Abdomen: Soft,. some bowel sounds, Colostomy with stool. Midline incision  With wound vac  Musculoskeletal: No clubbing cyanosis or edema.  Data Reviewed: Basic Metabolic Panel:  Recent Labs Lab 10/06/14 0309 10/07/14 0526 10/08/14 0520 10/09/14 0413 10/11/14 0451  NA 145 145 143 145 145  K 3.9 3.4* 4.1 4.5 3.9  CL 109 107 107 112* 111  CO2 27 28 25  20* 25  GLUCOSE 111* 75 66 62* 91  BUN 23* 17 19 18 11   CREATININE 1.37* 1.33* 1.26* 1.34* 1.18*  CALCIUM 8.2* 8.3* 8.1* 8.2* 8.1*  MG  --  2.0  --   --  2.1   Liver Function Tests: No results for input(s): AST, ALT, ALKPHOS, BILITOT, PROT, ALBUMIN in the last 168 hours. No results for input(s): LIPASE, AMYLASE in the last 168 hours. No results for input(s): AMMONIA in the last 168 hours. CBC:  Recent  Labs Lab 10/06/14 0309 10/07/14 0526 10/08/14 0520 10/09/14 0413 10/11/14 0451  WBC 14.3* 11.5* 11.3* 11.3* 10.9*  HGB 8.5* 9.1* 8.8* 8.8* 8.2*  HCT 27.5* 29.7* 29.2* 29.8* 26.7*  MCV 98.9 98.3 100.3* 103.1* 100.8*  PLT 218 203 205 234 234   Cardiac Enzymes: No results for input(s): CKTOTAL, CKMB, CKMBINDEX, TROPONINI in the last 168 hours. BNP (last 3 results) No results for input(s): BNP in the last 8760 hours.  ProBNP (last 3 results) No results for input(s): PROBNP in the last 8760 hours.  CBG: No results for input(s): GLUCAP in the last 168 hours.  No results found for this or any previous visit (from the past 240 hour(s)).   Studies: No results found.  Scheduled Meds: . amLODipine  10 mg Oral Daily  . aspirin  325 mg Oral Daily  . budesonide-formoterol  2 puff Inhalation BID  . enoxaparin (LOVENOX) injection  40 mg Subcutaneous Daily  . feeding supplement (RESOURCE BREEZE)  1 Container Oral TID BM  . lip balm  1 application Topical BID  . losartan  100 mg Oral Daily  . metoprolol tartrate  12.5 mg Oral BID  . nicotine  7 mg Transdermal Daily  . pantoprazole  40 mg Oral Daily  . pneumococcal 23 valent vaccine  0.5 mL Intramuscular Tomorrow-1000  . saccharomyces boulardii  250 mg Oral BID  . sucralfate  1 g Oral TID WC & HS  . 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 [facility] Pager [phone]. If 7PM-7AM, please contact night-coverage at [email or web address], password [facility] 10/12/2014, 12:55 PM  LOS: 13 days

## 2014-10-12 NOTE — Progress Notes (Signed)
ANTICOAGULATION CONSULT NOTE - Initial Consult  Pharmacy Consult for heparin Indication: chest pain/ACS  Allergies  Allergen Reactions  . Penicillins Rash  . Sulfa Antibiotics Rash    Patient Measurements: Height: 5\' 1"  (154.9 cm) Weight: 153 lb 14.4 oz (69.809 kg) IBW/kg (Calculated) : 47.8 Heparin Dosing Weight: 62.7 kg  Vital Signs: Temp: 98.7 F (37.1 C) (06/12 1332) Temp Source: Oral (06/12 1332) BP: 138/52 mmHg (06/12 1332) Pulse Rate: 68 (06/12 1332)  Labs:  Recent Labs  10/11/14 0451 10/12/14 1313 10/12/14 1647  HGB 8.2* 8.1*  --   HCT 26.7* 27.6*  --   PLT 234 276  --   CREATININE 1.18* 1.45*  --   TROPONINI  --  0.03 0.16*    Estimated Creatinine Clearance: 34.1 mL/min (by C-G formula based on Cr of 1.45).   Medical History: Past Medical History  Diagnosis Date  . CAD (coronary artery disease)     a. 07/2012 s/p MI ->not cath candidate 2/2 AKI; b. 09/2012 Neg MV;  c. 08/2014 [facility] CL: EF 45-54%, + ischemia, intermittent risk study.  Marland Kitchen PVD (peripheral vascular disease)   . CVA (cerebral infarction)     TIAx2  . HTN (hypertension)   . HLD (hyperlipidemia)   . Tobacco abuse   . Diverticulitis 08/04/12  . S/P partial lobectomy of lung 1997    per [facility], incidental finding, path c/w benign lesion  . Stroke   . COPD (chronic obstructive pulmonary disease)   . Fibromyalgia   . PONV (postoperative nausea and vomiting)   . Anxiety   . Diabetes mellitus without complication     on no meds   . GERD (gastroesophageal reflux disease)   . H/O echocardiogram     a. 07/2012 Echo: EF 50-55%, grade 2 DD, mild LVH.  Marland Kitchen Abnormal cardiac function test 09/22/2014    Medications:  Scheduled:  . amLODipine  10 mg Oral Daily  . aspirin  325 mg Oral Daily  . budesonide-formoterol  2 puff Inhalation BID  . feeding supplement (RESOURCE BREEZE)  1 Container Oral TID BM  . lip balm  1 application Topical BID  . losartan  100 mg Oral Daily  . metoprolol tartrate  12.5  mg Oral BID  . nicotine  7 mg Transdermal Daily  . pantoprazole  40 mg Oral Daily  . pneumococcal 23 valent vaccine  0.5 mL Intramuscular Tomorrow-1000  . saccharomyces boulardii  250 mg Oral BID  . sucralfate  1 g Oral TID WC & HS  . tiotropium  18 mcg Inhalation Daily    Assessment: 64 yo F with SBO due to sigmoid stricture with colovaginal fistula.  Previously scheduled for a cardiac cath to determine risk but was cancelled due to AKI. Pt now has CP and a troponin of 0.16.  Patient was previously of dvt prophylaxis dosing of enoxaparin 40 mg daily with the last dose the AM at 1030. Patient does have low hgb, but no reports of bleeding per RN.  Goal of Therapy:  Heparin level 0.3-0.7 units/ml Monitor platelets by anticoagulation protocol: Yes   Plan:  -Heparin bolus of 2000 units x 1 -Initiate heparin infusion of 750 units/hr -HL in 6 hrs -Daily CBC -Monitor H&H, s/sx of bleeding  Levester Fresh, PharmD, BCPS Clinical Pharmacist Pager [phone] 10/12/2014 9:06 PM

## 2014-10-12 NOTE — Progress Notes (Signed)
CSW contacted facility to assess if Pt could d/c to facility today. CSW was informed that admissions is not there over the weekend. CSW asked to speak with the nursing supervisor. CSW was placed on hold and no one ever came to the phone. CSW will attempt to contact the facility again. Likely that the Pt can not d/c to facility until Monday morning.   CSW will continue to follow Pt for d/c planning and SNF placement.     [facility]  2S, 58M,3S, 5N, 6N [phone]

## 2014-10-12 NOTE — Progress Notes (Signed)
[facility] progress note. Chief complaint. Elevated troponin. History of present illness. This 64 year old female with complicated hospitalization including colonic obstruction post surgical repair on 10/02/14. She has coronary artery disease and had a recent Myoview which determined intermediate risk. Patient was due for a cardiac catheterization which was canceled due to her worsening renal function. Patient apparently had some chest pain earlier today and a EKG was obtained at that time. The EKG looks fairly benign per review. Troponins were ordered and the initial troponin was negative however the second troponin has resulted elevated 0.16. I contacted the nursing staff on the floor and indicated the patient was chest pain-free at this time. I contacted cardiology Dr. Karlyn Agee who felt that these results might represent a non-ST elevated MI. He recommended aspirin and beta blocker which the patient is already receiving. He also recommended heparin drip. Due to the patient's recent surgery I contacted the surgical team to ensure that heparin would be appropriate following her fairly recent surgery. Cardiology does indicate that heparin can be initiated safely at this time. I have ordered further troponins so we can follow these for 6 sets every 6 hours. I will also repeat a 12-lead EKG serially every 6 hours for 2 events. As my call location is at [facility] long I will notify my fellow extender at [facility] of these developments.

## 2014-10-13 ENCOUNTER — Inpatient Hospital Stay (HOSPITAL_COMMUNITY): Payer: Commercial Managed Care - HMO

## 2014-10-13 DIAGNOSIS — I214 Non-ST elevation (NSTEMI) myocardial infarction: Secondary | ICD-10-CM

## 2014-10-13 DIAGNOSIS — N183 Chronic kidney disease, stage 3 (moderate): Secondary | ICD-10-CM

## 2014-10-13 LAB — CBC
HCT: 26 % — ABNORMAL LOW (ref 36.0–46.0)
Hemoglobin: 7.8 g/dL — ABNORMAL LOW (ref 12.0–15.0)
MCH: 30.4 pg (ref 26.0–34.0)
MCHC: 30 g/dL (ref 30.0–36.0)
MCV: 101.2 fL — AB (ref 78.0–100.0)
PLATELETS: 277 10*3/uL (ref 150–400)
RBC: 2.57 MIL/uL — ABNORMAL LOW (ref 3.87–5.11)
RDW: 15.5 % (ref 11.5–15.5)
WBC: 13.3 10*3/uL — ABNORMAL HIGH (ref 4.0–10.5)

## 2014-10-13 LAB — HEPARIN LEVEL (UNFRACTIONATED)
HEPARIN UNFRACTIONATED: 0.2 [IU]/mL — AB (ref 0.30–0.70)
HEPARIN UNFRACTIONATED: 0.18 [IU]/mL — AB (ref 0.30–0.70)

## 2014-10-13 LAB — BASIC METABOLIC PANEL
Anion gap: 7 (ref 5–15)
BUN: 10 mg/dL (ref 6–20)
CALCIUM: 8.2 mg/dL — AB (ref 8.9–10.3)
CO2: 27 mmol/L (ref 22–32)
Chloride: 113 mmol/L — ABNORMAL HIGH (ref 101–111)
Creatinine, Ser: 1.31 mg/dL — ABNORMAL HIGH (ref 0.44–1.00)
GFR calc non Af Amer: 41 mL/min — ABNORMAL LOW (ref >60)
GFR, EST AFRICAN AMERICAN: 48 mL/min — AB (ref >60)
GLUCOSE: 113 mg/dL — AB (ref 65–99)
POTASSIUM: 3.3 mmol/L — AB (ref 3.5–5.1)
Sodium: 147 mmol/L — ABNORMAL HIGH (ref 135–145)

## 2014-10-13 LAB — TROPONIN I
TROPONIN I: 0.03 ng/mL (ref <0.031)
TROPONIN I: 0.04 ng/mL — AB (ref <0.031)
Troponin I: 0.03 ng/mL (ref <0.031)

## 2014-10-13 LAB — ABO/RH: ABO/RH(D): A NEG

## 2014-10-13 LAB — PREPARE RBC (CROSSMATCH)

## 2014-10-13 MED ORDER — FUROSEMIDE 10 MG/ML IJ SOLN
20.0000 mg | Freq: Once | INTRAMUSCULAR | Status: AC
  Administered 2014-10-13: 20 mg via INTRAVENOUS
  Filled 2014-10-13: qty 2

## 2014-10-13 MED ORDER — POTASSIUM CHLORIDE CRYS ER 20 MEQ PO TBCR
40.0000 meq | EXTENDED_RELEASE_TABLET | Freq: Once | ORAL | Status: AC
  Administered 2014-10-13: 40 meq via ORAL
  Filled 2014-10-13: qty 2

## 2014-10-13 MED ORDER — SODIUM CHLORIDE 0.9 % IV SOLN
Freq: Once | INTRAVENOUS | Status: AC
  Administered 2014-10-14: 21:00:00 via INTRAVENOUS

## 2014-10-13 NOTE — Consult Note (Addendum)
WOC ostomy follow up [facility] following for assessment and plan of care to abd wound.  Bedside nurses are changing the Vac dressing Q T/TH/Sat. Stoma type/location: Pt states the bedside nurse changed her pouch yesterday. Current pouch is intact with good seal; mod amt semi-formed brown stool in the pouch.  She plans to discharge to a SNF where she will have further assistance with pouch application and emptying.  Pt has not attempted to participate in emptying or pouch change activities during the hospital stay. Scientist, clinical (histocompatibility and immunogenetics) and extra supplies at the bedside for staff nurse use.  Pt denies further questions at this time.  She has been placed on the [facility] discharge program and discussed ordering supplies after discharge from SNF.  Julien Girt MSN, RN, [place], [place], [place]

## 2014-10-13 NOTE — Progress Notes (Signed)
ANTICOAGULATION CONSULT NOTE - Follow Up Consult  Pharmacy Consult for Heparin  Indication: chest pain/ACS  Allergies  Allergen Reactions  . Penicillins Rash  . Sulfa Antibiotics Rash   Patient Measurements: Height: 5\' 1"  (154.9 cm) Weight: 153 lb 14.4 oz (69.809 kg) IBW/kg (Calculated) : 47.8   Vital Signs: Temp: 98.3 F (36.8 C) (06/13 0302) Temp Source: Oral (06/13 0302) BP: 142/50 mmHg (06/13 0302) Pulse Rate: 60 (06/13 0302)  Labs:  Recent Labs  10/11/14 0451  10/12/14 1313 10/12/14 1647 10/12/14 2251 10/13/14 0505  HGB 8.2*  --  8.1*  --   --  7.8*  HCT 26.7*  --  27.6*  --   --  26.0*  PLT 234  --  276  --   --  277  HEPARINUNFRC  --   --   --   --   --  0.20*  CREATININE 1.18*  --  1.45*  --   --   --   TROPONINI  --   < > 0.03 0.16* 0.04* 0.03  < > = values in this interval not displayed.  Estimated Creatinine Clearance: 34.1 mL/min (by C-G formula based on Cr of 1.45).   Assessment: Sub-therapeutic heparin level, no issues per RN.   Goal of Therapy:  Heparin level 0.3-0.7 units/ml Monitor platelets by anticoagulation protocol: Yes   Plan:  -Increase heparin to 900 units/hr -1400 HL -Daily CBC/HL -Monitor for bleeding  Narda Bonds 10/13/2014,6:14 AM

## 2014-10-13 NOTE — Progress Notes (Signed)
ANTICOAGULATION CONSULT NOTE - Follow Up Consult  Pharmacy Consult for Heparin  Indication: chest pain/ACS  Allergies  Allergen Reactions  . Penicillins Rash  . Sulfa Antibiotics Rash   Patient Measurements: Height: 5\' 1"  (154.9 cm) Weight: 153 lb 14.4 oz (69.809 kg) IBW/kg (Calculated) : 47.8   Vital Signs: Temp: 98.6 F (37 C) (06/13 2134) Temp Source: Oral (06/13 2134) BP: 175/67 mmHg (06/13 2134) Pulse Rate: 73 (06/13 2134)  Labs:  Recent Labs  10/11/14 0451 10/12/14 1313  10/13/14 0505 10/13/14 1159 10/13/14 1509 10/13/14 2107  HGB 8.2* 8.1*  --  7.8*  --   --   --   HCT 26.7* 27.6*  --  26.0*  --   --   --   PLT 234 276  --  277  --   --   --   HEPARINUNFRC  --   --   --  0.20*  --  0.18*  --   CREATININE 1.18* 1.45*  --   --  1.31*  --   --   TROPONINI  --  0.03  < > 0.03 0.03  --  0.04*  < > = values in this interval not displayed.  Estimated Creatinine Clearance: 37.7 mL/min (by C-G formula based on Cr of 1.31).   Assessment: Sub-therapeutic heparin level, no issues per RN.   HL remains subtherapeutic at 0.18 on heparin 900 units/hr. No issues with infusion or bleeding noted.  Goal of Therapy:  Heparin level 0.3-0.7 units/ml Monitor platelets by anticoagulation protocol: Yes   Plan:  -Increase heparin to 1000 units/hr -Daily CBC/HL -Monitor for bleeding  Andrey Cota. Diona Foley, PharmD Clinical Pharmacist Pager [phone] 10/13/2014,10:47 PM

## 2014-10-13 NOTE — Progress Notes (Signed)
PT Cancellation Note  Patient Details Name: Alexa Key MRN: [id number] DOB: 1951-01-04   Cancelled Treatment:    Reason Eval/Treat Not Completed: Patient at procedure or test/unavailable, Attempted to see patient this afternoon, however, transport arrived to room to take patient for procedure. Will re-attempt next day.   Duncan Dull 10/13/2014, 2:34 PM Alben Deeds, [place] DPT  [phone]

## 2014-10-13 NOTE — Progress Notes (Signed)
[facility] Progress Note  11 Days Post-Op  Subjective: Pt looks miserable.  C/o throwing up phlegm this monring and nausea.  Ambulating some OOB, usings IS.  Denies SOB or dysuria.  C/o indigestion and abdominal discomfort.  Having stools and flatus in bag.  Tolerating some PO intake on soft diet.    Objective: Vital signs in last 24 hours: Temp:  [97.6 F (36.4 C)-98.7 F (37.1 C)] 98.7 F (37.1 C) (06/13 0714) Pulse Rate:  [60-71] 71 (06/13 0714) Resp:  [18] 18 (06/13 0714) BP: (138-167)/(46-58) 167/58 mmHg (06/13 0714) SpO2:  [97 %-99 %] 97 % (06/13 0714) Last BM Date: 10/12/14  Intake/Output from previous day: 06/12 0701 - 06/13 0700 In: 840 [P.O.:840] Out: 870 [Urine:800; Drains:70] Intake/Output this shift: Total I/O In: -  Out: 250 [Stool:250]  PE: Gen:  Alert, NAD, pleasant Abd: Soft, more distension, tenderness in RLQ over heparin hematomas, but also throughout, +BS, no HSM, midline wound with wound vac in place, ostomy purple with redish brown tissue centrally, ostomy has flatus and soft brown stools in bag  Lab Results:   Recent Labs  10/12/14 1313 10/13/14 0505  WBC 15.0* 13.3*  HGB 8.1* 7.8*  HCT 27.6* 26.0*  PLT 276 277   BMET  Recent Labs  10/11/14 0451 10/12/14 1313  NA 145 145  K 3.9 3.8  CL 111 111  CO2 25 27  GLUCOSE 91 110*  BUN 11 11  CREATININE 1.18* 1.45*  CALCIUM 8.1* 8.4*   PT/INR No results for input(s): LABPROT, INR in the last 72 hours. CMP     Component Value Date/Time   NA 145 10/12/2014 1313   NA 145* 01/17/2013 1703   K 3.8 10/12/2014 1313   CL 111 10/12/2014 1313   CO2 27 10/12/2014 1313   GLUCOSE 110* 10/12/2014 1313   GLUCOSE 82 01/17/2013 1703   BUN 11 10/12/2014 1313   BUN 26 01/17/2013 1703   CREATININE 1.45* 10/12/2014 1313   CREATININE 1.69* 11/05/2012 1541   CALCIUM 8.4* 10/12/2014 1313   PROT 7.2 09/29/2014 1107   PROT 6.5 01/17/2013 1703   ALBUMIN 2.2* 10/03/2014 0309   AST 22  09/29/2014 1107   ALT 18 09/29/2014 1107   ALKPHOS 77 09/29/2014 1107   BILITOT 0.3 09/29/2014 1107   GFRNONAA 37* 10/12/2014 1313   GFRNONAA 32* 11/05/2012 1541   GFRAA 42* 10/12/2014 1313   GFRAA 36* 11/05/2012 1541   Lipase     Component Value Date/Time   LIPASE 29 09/29/2014 1108       Studies/Results: No results found.  Anti-infectives: Anti-infectives    Start     Dose/Rate Route Frequency Ordered Stop   10/07/14 1800  cefUROXime (CEFTIN) tablet 500 mg  Status:  Discontinued     500 mg Oral 2 times daily with meals 10/07/14 1203 10/11/14 1611   10/03/14 1530  cefTRIAXone (ROCEPHIN) 1 g in dextrose 5 % 50 mL IVPB - Premix  Status:  Discontinued     1 g 100 mL/hr over 30 Minutes Intravenous Every 24 hours 10/03/14 1454 10/07/14 1203   10/02/14 2200  Levofloxacin (LEVAQUIN) IVPB 250 mg  Status:  Discontinued     250 mg 50 mL/hr over 60 Minutes Intravenous Every 24 hours 10/01/14 2105 10/02/14 2006   10/02/14 2200  Levofloxacin (LEVAQUIN) IVPB 250 mg  Status:  Discontinued     250 mg 50 mL/hr over 60 Minutes Intravenous Every 24 hours 10/02/14 1000 10/03/14 1454   10/02/14  0800  clindamycin (CLEOCIN) IVPB 900 mg     900 mg 100 mL/hr over 30 Minutes Intravenous To [facility] 10/01/14 1238 10/02/14 0933   10/02/14 0800  gentamicin (GARAMYCIN) IVPB 100 mg     100 mg 200 mL/hr over 30 Minutes Intravenous To [facility] 10/01/14 1238 10/02/14 0938   10/01/14 2200  levofloxacin (LEVAQUIN) IVPB 500 mg     500 mg 100 mL/hr over 60 Minutes Intravenous  Once 10/01/14 2059 10/02/14 0104       Assessment/Plan Large bowel obstruction secondary to sigmoid colon stricture and colovaginal fistula  POD #11 exploratory laparotomy with sigmoid colectomy, colovaginal fistula takedown and colostomy--Dr. Brantley Stage -Continues to have intermittent nausea, heartburn, and "indigestion".  She's threw up phlegm this am.  Having no fevers. Wbc up to 15.0 yesterday, 13.3  today. -Cont PT -Stoma is purple, but viable, monitor, stool output -VAC in place, TTS ID- ceftin for UTI - completed. VTE prophylaxis-SCD/heparin Elevated troponin's - cardiology discontinuing heparin infusion Pulmonary-hypoxia has resolved, CXR ok. Has COPD. Pulling 719ml on IS which is improved.  FEN-soft, on orals and IV prn pain meds Disp-CT abd/pelvis with oral contrast ordered for today of abdomen given continued nausea, abdominal distension, and elevated WBC.     LOS: 14 days    Nat Christen 10/13/2014, 9:00 AM Pager: [phone]

## 2014-10-13 NOTE — Progress Notes (Signed)
Notified Kathline Magic of patient's latest Troponin of 0.04. Will continue to monitor.

## 2014-10-13 NOTE — Progress Notes (Addendum)
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 Patient had decreased appetite. Patient was scheduled for cardiac catheterization on 10/03/2014 for medical clearance for surgical intervention of fistula or stricture. Patient was admitted and general surgery consulted as patient had presented with a colonic obstruction. Cardiology was consulted and patient was scheduled for cardiac catheterization. On the day of cardiac catheterization patient was noted to be in acute renal failure with a creatinine going up as high as 2.59. Cardiac catheterization was canceled.  Patient and family accepted the risk for surgery and patient subsequently underwent a sigmoid colon colectomy with end colostomy takedown of colovaginal fistula. Patient was placed in the step down unit initially. Patient was noted to have a significant leukocytosis. Patient was pancultured and noted to have a Escherichia coli UTI. Patient was initially placed empirically on IV Levaquin however urine cultures were resistant to fluoroquinolones and antibiotics were switched to IV Rocephin. She finished treatment for UTI, She continue to have nausea and vomiting. Plan to repeat CT abdomen today.  She develops chest pain 6-12, mildly elevated troponin. Cardiology consulted.  Hb drop t 7. Plan to give 2 units of PRBC today.   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 Brantley Stage 10/02/2014.  -Colostomy with stool. -Ct abdomen order by Surgery  today, patient with persistent vomiting. .   2 coronary artery disease with intermediate risk Myoview Patient had an abnormal Myoview and was subsequently scheduled for cardiac catheterization which was to be done, however due to worsening renal function cardiac catheterization has been canceled. Patient will likely need to follow-up with cardiology as outpatient.  -troponin mildly elevated. Started on heparin Gtt overnight. Cardiology consulted.   sucralfate helping   3 Acute on chronic kidney disease III ( baseline creatinine 1.2-1.5) Patient with recent CT scan done with contrast. Patient also with small bowel obstruction with several episodes of emesis and nausea with poor oral intake. Renal function slowly trending down currently at 1..3 from 2.59.  FENa = 0.2%. Renal ultrasound negative for hydronephrosis.  NSL.   4 hypertension IV hydralazine PRN Continue with metoprolol, norvasc. Hold cozaar to avoid worsening renal function in setting of vomiting.   5 COPD Stable. Continue Symbicort and spiriva  6 colovaginal fistula Patient status post sigmoid colon colectomy with end colostomy takedown of colovaginal fistula 10/02/2014 per Dr. Brantley Stage. Per general surgery.  [place] UTI Patient with improvement with dysuria. Urine cultures with greater than 100,000 Escherichia coli. Continue with Ceftin.  antibiotics day 7/7.  8 Abnormal chest x-ray Chest x-ray with concerns for possible pneumonia. Patient afebrile. Patient does have a leukocytosis which is trending down. Repeated  CXR with improving atelectasis and mild interstitial edema pattern.  Continue IS, flutter valve.   9-Anemia; Hb decrease to 7. Will transfuse 2 units due to CAD.   10 SVT Patient noted to have SVT 2 nights ago. No further SVT. Patient asymptomatic.  Continue with metoprolol.   11 tobacco abuse Nicotine patch. Tobacco cessation.  12 prophylaxis PPI for GI prophylaxis. Lovenox for DVT prophylaxis.    Hypoglycemia. received amp D  50. Started on clear diet Resolved.   Code Status: Full Family Communication: Updated patient.  Disposition Plan: SNF when medical ready. Awaiting repeat Ct Abdomen, cardiology evaluation   Consultants:  Gen. surgery: Dr. Brantley Stage 09/30/2014  Cardiology: Dr. Ellyn Hack 09/30/2014  Procedures:  CT abdomen and pelvis 09/29/2014  Renal ultrasound 10/01/2014  Chest x-ray 10/03/2014, 10/05/14 Sigmoid colon colectomy with in colostomy takedown of colovaginal fistula per Dr. Brantley Stage 10/02/2014  Antibiotics:  IV Levaquin 10/01/2014 >>> 10/03/2014  IV Rocephin 10/03/2014>>>>> 10/07/2014  Oral Ceftin 10/07/2014  HPI/Subjective: Vomited this morning. No more chest pain.    Objective: Filed Vitals:   10/13/14 0714  BP: 167/58  Pulse: 71  Temp: 98.7 F (37.1 C)  Resp: 18    Intake/Output Summary (Last 24 hours) at 10/13/14 0945 Last data filed at 10/13/14 0713  Gross per 24 hour  Intake    600 ml  Output   1120 ml  Net   -520 ml   Filed Weights   09/30/14 0534 10/01/14 0602 10/12/14 0524  Weight: 66.497 kg (146 lb 9.6 oz) 66.679 kg (147 lb) 69.809 kg (153 lb 14.4 oz)    Exam:   General:  NAD.   Cardiovascular: RRR  Respiratory: CTAB anterior lung fields  Abdomen: Soft,. some bowel sounds, Colostomy with stool. Midline incision  With wound vac  Musculoskeletal: No clubbing cyanosis or edema.  Data Reviewed: Basic Metabolic Panel:  Recent Labs Lab 10/07/14 0526 10/08/14 0520 10/09/14 0413 10/11/14 0451 10/12/14 1313  NA 145 143 145 145 145  K 3.4* 4.1 4.5 3.9 3.8  CL 107 107 112* 111 111  CO2 28 25 20* 25 27  GLUCOSE 75 66 62* 91 110*  BUN 17 19 18 11 11   CREATININE 1.33* 1.26* 1.34* 1.18* 1.45*  CALCIUM 8.3* 8.1* 8.2* 8.1* 8.4*  MG 2.0  --   --  2.1  --    Liver Function Tests: No results for input(s): AST, ALT, ALKPHOS, BILITOT, PROT, ALBUMIN in the last 168 hours. No results for input(s): LIPASE, AMYLASE in the  last 168 hours. No results for input(s): AMMONIA in the last 168 hours. CBC:  Recent Labs Lab 10/08/14 0520 10/09/14 0413 10/11/14 0451 10/12/14 1313 10/13/14 0505  WBC 11.3* 11.3* 10.9* 15.0* 13.3*  HGB 8.8* 8.8* 8.2* 8.1* 7.8*  HCT 29.2* 29.8* 26.7* 27.6* 26.0*  MCV 100.3* 103.1* 100.8* 101.8* 101.2*  PLT 205 234 234 276 277   Cardiac Enzymes:  Recent Labs Lab 10/12/14 1313 10/12/14 1647 10/12/14 2251 10/13/14 0505  TROPONINI 0.03 0.16* 0.04* 0.03   BNP (last 3 results) No results for input(s): BNP in the last 8760 hours.  ProBNP (last 3 results) No results for input(s): PROBNP in the last 8760 hours.  CBG: No results for input(s): GLUCAP in the last 168 hours.  No results found for this or any previous visit (from the past 240 hour(s)).   Studies: No results found.  Scheduled Meds: . sodium chloride   Intravenous Once  . amLODipine  10 mg Oral Daily  . aspirin  325 mg Oral Daily  . budesonide-formoterol  2 puff Inhalation BID  . feeding supplement (RESOURCE BREEZE)  1 Container Oral TID BM  . furosemide  20 mg Intravenous Once  . lip balm  1 application Topical BID  . losartan  100 mg Oral Daily  . metoprolol tartrate  12.5 mg Oral BID  . nicotine  7 mg Transdermal Daily  . pantoprazole  40 mg Oral Daily  .  pneumococcal 23 valent vaccine  0.5 mL Intramuscular Tomorrow-1000  . saccharomyces boulardii  250 mg Oral BID  . sucralfate  1 g Oral TID WC & HS  . tiotropium  18 mcg Inhalation Daily   Continuous Infusions: . 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 [facility] Pager [phone]. If 7PM-7AM, please contact night-coverage at [email or web address], password [facility] 10/13/2014, 9:45 AM  LOS: 14 days

## 2014-10-13 NOTE — Progress Notes (Signed)
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 She currently denies chest pain or SOB.  CURRENT MEDS . sodium chloride   Intravenous Once  . amLODipine  10 mg Oral Daily  . aspirin  325 mg Oral Daily  . budesonide-formoterol  2 puff Inhalation BID  . feeding supplement (RESOURCE BREEZE)  1 Container Oral TID BM  . furosemide  20 mg Intravenous Once  . lip balm  1 application Topical BID  . losartan  100 mg Oral Daily  . metoprolol tartrate  12.5 mg Oral BID  . nicotine  7 mg Transdermal Daily  . pantoprazole  40 mg Oral Daily  . pneumococcal 23 valent vaccine  0.5 mL Intramuscular Tomorrow-1000  . saccharomyces boulardii  250 mg Oral BID  . sucralfate  1 g Oral TID WC & HS  . tiotropium  18 mcg Inhalation Daily   . heparin 900 Units/hr (10/13/14 0620)   OBJECTIVE  Filed Vitals:   10/12/14 1332 10/12/14 2100 10/13/14 0302 10/13/14 0714  BP: 138/52 149/46 142/50 167/58  Pulse: 68 64 60 71  Temp: 98.7 F (37.1 C) 97.6 F (36.4 C) 98.3 F (36.8 C) 98.7 F (37.1 C)  TempSrc: Oral Axillary Oral Axillary  Resp: 18 18 18 18   Height:      Weight:      SpO2: 99% 97% 98% 97%     Intake/Output Summary (Last 24 hours) at 10/13/14 0902 Last data filed at 10/13/14 0713  Gross per 24 hour  Intake    600 ml  Output   1120 ml  Net   -520 ml   Filed Weights   09/30/14 0534 10/01/14 0602 10/12/14 0524  Weight: 146 lb 9.6 oz (66.497 kg) 147 lb (66.679 kg) 153 lb 14.4 oz (69.809 kg)   PHYSICAL EXAM  General: Pleasant, NAD. Neuro: Alert and oriented X 3. Moves all extremities spontaneously. Psych: Normal affect. HEENT:  Normal  Neck: Supple without bruits or JVD. Lungs:  Resp regular and unlabored, CTA. Heart: RRR no s3, s4, or murmurs. Abdomen: Soft, non-tender, non-distended, BS + x 4.  Extremities: No clubbing, cyanosis or edema. DP/PT/Radials 2+ and equal bilaterally.  Accessory Clinical Findings  CBC  Recent Labs  10/12/14 1313 10/13/14 0505  WBC 15.0* 13.3*  HGB 8.1* 7.8*  HCT 27.6* 26.0*  MCV 101.8* 101.2*  PLT 276 546   Basic Metabolic Panel  Recent Labs  10/11/14 0451 10/12/14 1313  NA 145 145  K 3.9 3.8  CL 111 111  CO2 25 27  GLUCOSE 91 110*  BUN 11 11  CREATININE 1.18* 1.45*  CALCIUM 8.1* 8.4*  MG 2.1  --     Recent Labs  10/12/14 1647 10/12/14  2251 10/13/14 0505  TROPONINI 0.16* 0.04* 0.03   TELE: SR, nsVT - 2-3 beats  ECG: SR, anterolateral ischemia   ASSESSMENT AND PLAN  64 year old female with h/o CAD 07/2012 s/p MI ->not cath candidate 2/2 AKI; b. 09/2012 Neg MV; c. 08/2014 Lex[place] CL: EF 45-54%, + ischemia, intermittent risk study, with moderate sized reversible anterior, apical and apical lateral perfusion defect. She was originally scheduled for outpatient cath but developed protracted N/V and was admitted. She has a rectovaginal fistula and partial colectomy planned. This was deferred sec to significant decline in renal function with creatinine 1.2>>2.59 in 2 days.  Cardiac cath was cancelled. She underwent sigmoid colon colectomy with end colostomy takedown of colovaginal fistula per Dr. Brantley Stage 10/02/2014, now  with colostomy. Yesterday and today she had an episode of chest congestion with nausea/vomiting, her troponin is elevated 0.16-> 0.04--> 0.0. ECG shows new ischemia lateral leads (now in V4-6, previously only in I and aVL). She was started on Heparin.   I have had a discussion with the patient and her daughter. The patient is currently asymptomatic and would rather wait.  There are two ongoing problems: Her Crea has worsened again, and her Hb is 7.8, previously 12.4 prior to the surgery. I agree with the Heparin infusion to see if the Hb drop is the result of surgery or there is additional bleeding. The patient also appears mildly fluid overloaded.  Plan:  - can d/c Heparin infusion as the troponin is now normal - consider a transfusion in a patient with NSTEMI and Hb < 8 - Administer lasix 20 mg iv today and after every transfusion, monitor Crea closely - we will continue to monitor and plan for a left cath if Hb stable and Crea back to baseline ( 1.2) - BP is elevated I would add imdur 30 mg po daily   Signed, Dorothy Spark MD, [facility] 10/13/2014

## 2014-10-14 ENCOUNTER — Inpatient Hospital Stay (HOSPITAL_COMMUNITY): Payer: Commercial Managed Care - HMO

## 2014-10-14 DIAGNOSIS — N824 Other female intestinal-genital tract fistulae: Secondary | ICD-10-CM

## 2014-10-14 DIAGNOSIS — K567 Ileus, unspecified: Secondary | ICD-10-CM | POA: Insufficient documentation

## 2014-10-14 LAB — BASIC METABOLIC PANEL
Anion gap: 8 (ref 5–15)
BUN: 8 mg/dL (ref 6–20)
CALCIUM: 8.5 mg/dL — AB (ref 8.9–10.3)
CO2: 29 mmol/L (ref 22–32)
Chloride: 111 mmol/L (ref 101–111)
Creatinine, Ser: 1.27 mg/dL — ABNORMAL HIGH (ref 0.44–1.00)
GFR calc Af Amer: 50 mL/min — ABNORMAL LOW (ref >60)
GFR, EST NON AFRICAN AMERICAN: 43 mL/min — AB (ref >60)
Glucose, Bld: 112 mg/dL — ABNORMAL HIGH (ref 65–99)
Potassium: 3.9 mmol/L (ref 3.5–5.1)
Sodium: 148 mmol/L — ABNORMAL HIGH (ref 135–145)

## 2014-10-14 LAB — HEPARIN LEVEL (UNFRACTIONATED): Heparin Unfractionated: 0.3 IU/mL (ref 0.30–0.70)

## 2014-10-14 LAB — TYPE AND SCREEN
ABO/RH(D): A NEG
Antibody Screen: NEGATIVE
UNIT DIVISION: 0
Unit division: 0

## 2014-10-14 LAB — CBC
HEMATOCRIT: 33.7 % — AB (ref 36.0–46.0)
Hemoglobin: 10.5 g/dL — ABNORMAL LOW (ref 12.0–15.0)
MCH: 29.5 pg (ref 26.0–34.0)
MCHC: 31.2 g/dL (ref 30.0–36.0)
MCV: 94.7 fL (ref 78.0–100.0)
Platelets: 302 10*3/uL (ref 150–400)
RBC: 3.56 MIL/uL — ABNORMAL LOW (ref 3.87–5.11)
RDW: 18.4 % — AB (ref 11.5–15.5)
WBC: 13.9 10*3/uL — ABNORMAL HIGH (ref 4.0–10.5)

## 2014-10-14 LAB — TROPONIN I: TROPONIN I: 0.05 ng/mL — AB (ref <0.031)

## 2014-10-14 MED ORDER — ISOSORBIDE MONONITRATE ER 30 MG PO TB24
30.0000 mg | ORAL_TABLET | Freq: Every day | ORAL | Status: DC
  Administered 2014-10-14 – 2014-10-21 (×8): 30 mg via ORAL
  Filled 2014-10-14 (×8): qty 1

## 2014-10-14 MED ORDER — FUROSEMIDE 10 MG/ML IJ SOLN
20.0000 mg | Freq: Once | INTRAMUSCULAR | Status: AC
  Administered 2014-10-14: 20 mg via INTRAVENOUS
  Filled 2014-10-14: qty 2

## 2014-10-14 MED ORDER — POLYETHYLENE GLYCOL 3350 17 G PO PACK
17.0000 g | PACK | Freq: Every day | ORAL | Status: DC
  Administered 2014-10-16 – 2014-10-21 (×6): 17 g via ORAL
  Filled 2014-10-14 (×6): qty 1

## 2014-10-14 MED ORDER — ENSURE ENLIVE PO LIQD
237.0000 mL | Freq: Two times a day (BID) | ORAL | Status: DC
  Administered 2014-10-14 – 2014-10-17 (×3): 237 mL via ORAL
  Filled 2014-10-14 (×4): qty 237

## 2014-10-14 MED ORDER — HEPARIN (PORCINE) IN NACL 100-0.45 UNIT/ML-% IJ SOLN
1350.0000 [IU]/h | INTRAMUSCULAR | Status: DC
  Administered 2014-10-14 (×2): 1200 [IU]/h via INTRAVENOUS
  Filled 2014-10-14 (×4): qty 250

## 2014-10-14 MED ORDER — DOCUSATE SODIUM 100 MG PO CAPS
100.0000 mg | ORAL_CAPSULE | Freq: Two times a day (BID) | ORAL | Status: DC
  Administered 2014-10-14 – 2014-10-21 (×15): 100 mg via ORAL
  Filled 2014-10-14 (×16): qty 1

## 2014-10-14 NOTE — Clinical Social Work Note (Signed)
CSW contacted [facility] to inform SNF that patient is not discharging today.  SNF said they can still take patient once she is medically ready and discharge orders have been received.  CSW to continue to follow patient throughout discharge planning.  Jones Broom. Rush, MSW, [place] 10/14/2014 4:38 PM

## 2014-10-14 NOTE — Progress Notes (Signed)
PROGRESS NOTE  Alexa Key GMW:[id number] DOB: [birth year] DOA: 09/29/2014 PCP: [name], PA-C  HPI/Recap of past 78 hours: 64 year old female with past medical history of chronic abdominal pain from rectovaginal fistula plus sigmoid stricture presented with 7 days of abdominal pain and found to have colonic obstruction. Plan was for surgical intervention of fistula versus stricture with cardiac catheterization (after patient had failed Myoview ) done prior to surgery for clearance. Patient unable to get cardiac catheterization due to acute renal failure on day of catheterization. 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  Following surgery, patient with persistent leukocytosis and found to have UTI. Cardiology will be consulted after patient started having chest pain with mildly elevated troponin. On 6/13, patient's hemoglobin had dropped to as low as 7. She did not have an acute drop slowly trending down since admission.  She has also since developed postop ileus.  Assessment/Plan:    Tobacco user: Counseled. On nicotine patch.    COPD (chronic obstructive pulmonary disease): Stable.    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 Surgery following. Continue wound VAC.   CAD with mild non-STEMI and ischemia.: For diagnostic cath once renal function improved. We'll discuss with surgery and cardiology as to whether catheterization can be done before wound VAC and closure of open abdomen which I suspect may not be anytime soon.  Acute on chronic diastolic heart failure: Patient up 11 pounds and +7.3 L. With renal function almost to baseline, can hopefully start diarrhetic syndrome    GERD (gastroesophageal reflux disease)  Acute  kidney injury in the setting of stage III chronic kidney disease: Back to near baseline. Felt to be secondary to recent CT scan done with contrast plus episodes of emesis plus poor oral intake. Renal ultrasound unrevealing.       E-coli UTI: Completed event about its.     Code Status: Full code  Family Communication: Daughter at the bedside  Disposition Plan: Cardiac cath once renal function improves.   Consultants:  Cardiology  General surgery  Procedures: Sigmoid colon colectomy with in colostomy takedown of colovaginal fistula per Dr. Brantley Stage 10/02/2014  Antibiotics:  IV Levaquin 10/01/2014 >>> 10/03/2014  IV Rocephin 10/03/2014>>>>> 10/07/2014  Oral Ceftin 10/07/2014   Objective: BP 170/62 mmHg  Pulse 72  Temp(Src) 98.5 F (36.9 C) (Oral)  Resp 20  Ht 5\' 1"  (1.549 m)  Wt 69.809 kg (153 lb 14.4 oz)  BMI 29.09 kg/m2  SpO2 94%  Intake/Output Summary (Last 24 hours) at 10/14/14 2046 Last data filed at 10/14/14 0600  Gross per 24 hour  Intake    759 ml  Output    335 ml  Net    424 ml   Filed Weights   09/30/14 0534 10/01/14 0602 10/12/14 0524  Weight: 66.497 kg (146 lb 9.6 oz) 66.679 kg (147 lb) 69.809 kg (153 lb 14.4 oz)    Exam:   General:  Alert and oriented 3, no acute distress  Cardiovascular: Regular rate and rhythm, Y4-I3, 2/6 systolic ejection murmur  Respiratory: Poor inspiratory effort, decreased at bases  Abdomen: Soft, nontender, ostomy noted, few bowel sounds, wound VAC in place  Musculoskeletal: Trace edema   Data Reviewed: Basic Metabolic Panel:  Recent Labs Lab 10/09/14 0413 10/11/14 0451 10/12/14 1313 10/13/14 1159 10/14/14 0351  NA 145 145 145 147* 148*  K 4.5 3.9 3.8 3.3* 3.9  CL 112* 111 111 113* 111  CO2 20* 25 27 27 29   GLUCOSE 62* 91 110* 113* 112*  BUN 18 11 11 10 8   CREATININE 1.34* 1.18* 1.45* 1.31* 1.27*  CALCIUM 8.2* 8.1* 8.4* 8.2* 8.5*  MG  --  2.1  --   --   --    Liver Function Tests: No results  for input(s): AST, ALT, ALKPHOS, BILITOT, PROT, ALBUMIN in the last 168 hours. No results for input(s): LIPASE, AMYLASE in the last 168 hours. No results for input(s): AMMONIA in the last 168 hours. CBC:  Recent Labs Lab 10/09/14 0413 10/11/14 0451 10/12/14 1313 10/13/14 0505 10/14/14 0351  WBC 11.3* 10.9* 15.0* 13.3* 13.9*  HGB 8.8* 8.2* 8.1* 7.8* 10.5*  HCT 29.8* 26.7* 27.6* 26.0* 33.7*  MCV 103.1* 100.8* 101.8* 101.2* 94.7  PLT 234 234 276 277 302   Cardiac Enzymes:    Recent Labs Lab 10/12/14 2251 10/13/14 0505 10/13/14 1159 10/13/14 2107 10/14/14 0351  TROPONINI 0.04* 0.03 0.03 0.04* 0.05*   BNP (last 3 results) No results for input(s): BNP in the last 8760 hours.  ProBNP (last 3 results) No results for input(s): PROBNP in the last 8760 hours.  CBG: No results for input(s): GLUCAP in the last 168 hours.  No results found for this or any previous visit (from the past 240 hour(s)).   Studies: Dg Abd Portable 1v  10/14/2014   CLINICAL DATA:  Ileus.  EXAM: PORTABLE ABDOMEN - 1 VIEW  COMPARISON:  CT 10/13/2014.  FINDINGS: Persistent dilated loops of small bowel noted. Colonic gas pattern is normal. These findings suggest mild partial small-bowel obstruction versus adynamic ileus. No free air. Mild residual oral contrast noted. Ostomy site left lower quadrant. Aortoiliac atherosclerotic vascular disease. Prior lumbosacral spine fusion. No acute bony abnormality. Surgical clip right lower quadrant.  IMPRESSION: 1. Persistent dilated loops of small bowel with normal colonic gas pattern. These findings suggest persistent mild partial small-bowel obstruction versus adynamic ileus. Ostomy site noted left lower quadrant. 2. Aortoiliac atherosclerotic vascular disease.   Electronically Signed   By: [place]   On: 10/14/2014 07:37    Scheduled Meds: . sodium chloride   Intravenous Once  . amLODipine  10 mg Oral Daily  . aspirin  325 mg Oral Daily  .  budesonide-formoterol  2 puff Inhalation BID  . docusate sodium  100 mg Oral BID  . feeding supplement (ENSURE ENLIVE)  237 mL Oral BID BM  . isosorbide mononitrate  30 mg Oral Daily  . lip balm  1 application Topical BID  . metoprolol tartrate  12.5 mg Oral BID  . nicotine  7 mg Transdermal Daily  . pantoprazole  40 mg Oral Daily  . pneumococcal 23 valent vaccine  0.5 mL Intramuscular Tomorrow-1000  . polyethylene glycol  17 g Oral Daily  . saccharomyces boulardii  250 mg Oral BID  . sucralfate  1 g Oral TID WC & HS  . tiotropium  18 mcg Inhalation Daily    Continuous Infusions: . heparin 1,200 Units/hr (10/14/14 0731)     Time spent: 15 minutes  [facility] Pager [phone]. If 7PM-7AM, please contact night-coverage at [email or web address], password [facility] 10/14/2014, 8:46 PM  LOS: 15 days

## 2014-10-14 NOTE — Progress Notes (Signed)
Physical Therapy Treatment Patient Details Name: Alexa Key MRN: [id number] DOB: 1951/03/18 Today's Date: 10/14/2014    History of Present Illness Patient is a 64 y/o female s/p exploratory laparotomy with sigmoid colectomy, colovaginal fistula takedown and colostomy 6/2. PMH includes CAD,PVD, CVA, HTN,COPD, tobacco use, fibromyalgia and DM.    PT Comments    Patient able to tolerate increased activity but required significant time and several standing rest breaks to perform. Patient performed ambulation in halls in addition to functional tasks in room. Required 2 liters O2 for ambulation but continues to report SOB.   Follow Up Recommendations  SNF;Supervision/Assistance - 24 hour     Equipment Recommendations  Other (comment) (defer to next venue)    Recommendations for Other Services       Precautions / Restrictions Precautions Precautions: Fall Restrictions Weight Bearing Restrictions: No    Mobility  Bed Mobility Overal bed mobility: Needs Assistance   Rolling: Min assist Sidelying to sit: Min assist     Sit to sidelying: Mod assist General bed mobility comments: Vcs for positioning and technique, assist to elevate trunk, upon return to bed assist for LEs elevation and repositioning  Transfers Overall transfer level: Needs assistance Equipment used: Rolling walker (2 wheeled) Transfers: Sit to/from Stand Sit to Stand: Min guard Stand pivot transfers: Min guard       General transfer comment: performed from various surfaces (bed and toilet) increased time and cues for safety required  Ambulation/Gait Ambulation/Gait assistance: Min guard;Min assist Ambulation Distance (Feet): 120 Feet (x3) Assistive device: Rolling walker (2 wheeled) Gait Pattern/deviations: Step-through pattern;Wide base of support;Decreased stride length;Decreased step length - right;Decreased step length - left;Trunk flexed Gait velocity: slow Gait velocity interpretation: <1.8  ft/sec, indicative of risk for recurrent falls General Gait Details:  2L while ambulating, SpO2 maintained 96%. Required several standing rest breaks to complete distance. Continuously running into objects, assist at times to help control RW (significant time required to amb, several standing breaks)   Stairs            Wheelchair Mobility    Modified Rankin (Stroke Patients Only)       Balance     Sitting balance-Leahy Scale: Fair     Standing balance support: Bilateral upper extremity supported Standing balance-Leahy Scale: Fair Standing balance comment: reliance on UE support                    Cognition Arousal/Alertness: Awake/alert Behavior During Therapy: WFL for tasks assessed/performed Overall Cognitive Status: Within Functional Limits for tasks assessed                      Exercises General Exercises - Lower Extremity Ankle Circles/Pumps: AROM;Both;10 reps;Seated    General Comments        Pertinent Vitals/Pain Pain Assessment: Faces Faces Pain Scale: Hurts even more Pain Location: abdominal region Pain Descriptors / Indicators: Sore Pain Intervention(s): Monitored during session;Repositioned;Relaxation    Home Living                      Prior Function            PT Goals (current goals can now be found in the care plan section) Acute Rehab PT Goals Patient Stated Goal: to live independently again PT Goal Formulation: With patient/family Time For Goal Achievement: 10/20/14 Potential to Achieve Goals: Good Progress towards PT goals: Progressing toward goals    Frequency  Min 3X/week  PT Plan Current plan remains appropriate    Co-evaluation             End of Session Equipment Utilized During Treatment: Gait belt;Oxygen Activity Tolerance: Patient tolerated treatment well;Patient limited by fatigue Patient left: in bed;with call bell/phone within reach;with family/visitor present     Time:  1157-2620 PT Time Calculation (min) (ACUTE ONLY): 25 min  Charges:  $Gait Training: 8-22 mins $Therapeutic Activity: 8-22 mins                    G CodesDuncan Dull October 29, 2014, 1:35 PM Alben Deeds, [place] DPT  [phone]

## 2014-10-14 NOTE — Progress Notes (Signed)
Patient Name: Alexa Key Date of Encounter: 10/14/2014  Primary Cardiologist: J. 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 Some burning sensation in the chest on Sat and yesterday, resolved. Still has not eaten much. Afraid of feeling nauseated again, but no nausea now.   CURRENT MEDS . sodium chloride   Intravenous Once  . amLODipine  10 mg Oral Daily  . aspirin  325 mg Oral Daily  . budesonide-formoterol  2 puff Inhalation BID  . docusate sodium  100 mg Oral BID  . feeding supplement (ENSURE ENLIVE)  237 mL Oral BID BM  . lip balm  1 application Topical BID  . metoprolol tartrate  12.5 mg Oral BID  . nicotine  7 mg Transdermal Daily  . pantoprazole  40 mg Oral Daily  . pneumococcal 23 valent vaccine  0.5 mL Intramuscular Tomorrow-1000  . polyethylene glycol  17 g Oral Daily  . saccharomyces boulardii  250 mg Oral BID  . sucralfate  1 g Oral TID WC & HS  . tiotropium  18 mcg Inhalation Daily    OBJECTIVE  Filed Vitals:   10/13/14 2134 10/14/14 0008 10/14/14 0502 10/14/14 0934  BP: 175/67 179/58 180/60 172/69  Pulse: 73 73 73 73  Temp: 98.6 F (37 C) 98.3 F (36.8 C) 98.6 F (37 C)   TempSrc: Oral Oral Oral   Resp: 20 20 20    Height:      Weight:      SpO2: 99% 99% 93%     Intake/Output Summary (Last 24 hours) at 10/14/14 1401 Last data filed at  10/14/14 0600  Gross per 24 hour  Intake   1214 ml  Output    375 ml  Net    839 ml   Filed Weights   09/30/14 0534 10/01/14 0602 10/12/14 0524  Weight: 146 lb 9.6 oz (66.497 kg) 147 lb (66.679 kg) 153 lb 14.4 oz (69.809 kg)    PHYSICAL EXAM  General: Pleasant, NAD. Neuro: Alert and oriented X 3. Moves all extremities spontaneously. Psych: Normal affect. HEENT:  Normal  Neck: Supple without bruits or JVD. Lungs:  Resp regular and unlabored. Bibasilar rale.  Heart: RRR no s3, s4, or murmurs. Abdomen: Soft, non-tender, non-distended, BS + x 4.  Extremities: No clubbing, cyanosis or edema. DP/PT/Radials 2+ and equal bilaterally.  Accessory Clinical Findings  CBC  Recent Labs  10/13/14 0505 10/14/14 0351  WBC 13.3* 13.9*  HGB 7.8* 10.5*  HCT 26.0* 33.7*  MCV 101.2* 94.7  PLT 277 161   Basic Metabolic Panel  Recent Labs  10/13/14 1159 10/14/14 0351  NA 147* 148*  K 3.3* 3.9  CL 113* 111  CO2 27 29  GLUCOSE 113* 112*  BUN 10 8  CREATININE 1.31* 1.27*  CALCIUM 8.2* 8.5*   Cardiac Enzymes  Recent Labs  10/13/14 1159 10/13/14 2107 10/14/14 0351  TROPONINI  0.03 0.04* 0.05*   TELE NSR with burst of SVT    ECG  No new EKG  Echocardiogram 08/07/2012  LV EF: 50% -  55%  ------------------------------------------------------------ History:  PMH: NSTEMI. Coronary artery disease. Risk factors: History of CVA. Current tobacco use. Hypertension. Diabetes mellitus. Dyslipidemia.  ------------------------------------------------------------ Study Conclusions  - Left ventricle: The cavity size was normal. Wall thickness was increased in a pattern of mild LVH. Systolic function was normal. The estimated ejection fraction was in the range of 50% to 55%. Wall motion was normal; there were no regional wall motion abnormalities. Features are consistent with a pseudonormal left ventricular filling pattern, with concomitant abnormal relaxation  and increased filling pressure (grade 2 diastolic dysfunction). Doppler parameters are consistent with high ventricular filling pressure. - Mitral valve: Calcified annulus. - Left atrium: The atrium was moderately dilated. - Pulmonary arteries: Systolic pressure was mildly increased. PA peak pressure: 1mm Hg (S).    Radiology/Studies  Ct Abdomen Pelvis Wo Contrast  10/13/2014   CLINICAL DATA:  64 year old female 10 days post exploratory laparotomy with sigmoid colectomy, colovaginal fistula takedown and colostomy formation. Sigmoid colon stricture noted at the time of surgery. Nausea with vomiting phlegm. Subsequent encounter.  EXAM: CT ABDOMEN AND PELVIS WITHOUT CONTRAST  TECHNIQUE: Multidetector CT imaging of the abdomen and pelvis was performed following the standard protocol without IV contrast.  COMPARISON:  Plain film examination 10/07/2014. Preoperative CT 09/29/2014.  FINDINGS: Post resection portion of the descending colon and sigmoid colon. Rectosigmoid pouch without complication. Left lower abdominal colostomy site without complication.  Subcutaneous edema most notable buttock and upper thigh region greater on the right may represent dependent edema or third spacing of fluid.  Anterior abdominal/pelvic wall incision with gas and mild infiltration of fat planes at the base of the incision site which may represent residua of healing by secondary intent without well-defined drainable abscess.  Contrast and partially gas-filled prominent size loops of small bowel with change of caliber in the left lower quadrant. This may reflect result of adhesions and mild partial obstruction. The small bowel loops extend towards the base of the incision site but do not appear to enter such.  Basilar subsegmental atelectasis with small left-sided pleural effusion.  Heart size top-normal coronary artery calcifications and aortic root calcifications.  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 Ectasia right common iliac artery. Narrowing of the left common iliac artery.  Gallstones/gallbladder sludge.  Taking into account limitation by non contrast imaging, no worrisome hepatic, splenic, pancreatic or adrenal lesion.  Scarring of the kidneys greater on the left. Fullness of the renal collecting systems without calcified obstructing stone identified. Left renal nonobstructing calculi and/or vascular calcifications unchanged. Left renal sub cm hyperdense lesions possibly hemorrhagic cysts although incompletely assessed.  Partially contracted noncontrast filled urinary bladder without obvious abnormality.  Postsurgical changes L5-S1.  IMPRESSION: Post resection portion of the descending colon and sigmoid colon. Rectosigmoid pouch without complication. Left lower abdominal colostomy site without complication.  Anterior abdominal/pelvic wall incision with gas and mild infiltration of fat planes at the base of the incision site which may represent residua of healing by secondary intent without well-defined drainable abscess.  Contrast and partially gas-filled prominent size loops of small bowel with change of caliber in the left lower quadrant. This may reflect result of adhesions and mild partial obstruction. The small bowel loops extend towards the base of the incision site but  do not appear to enter such.  Basilar subsegmental atelectasis with small left-sided pleural effusion.  Heart size top-normal coronary artery calcifications and aortic root calcifications.  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  Gallstones/gallbladder sludge.  Scarring of the kidneys greater on the left. Fullness of the renal collecting systems without calcified obstructing stone identified.  Dependent edema/third spacing of fluid.    Electronically Signed   By: Genia Del M.D.   On: 10/13/2014 15:44   Dg Abd 1 View  10/07/2014   CLINICAL DATA:  64 year old with vomiting. Recent abdominal surgery.  EXAM: ABDOMEN - 1 VIEW  COMPARISON:  Prior CT scan of the abdomen and pelvis 09/29/2014  FINDINGS: Multiple dilated air-filled loops of small bowel noted throughout abdomen. Left lower quadrant noted. No evidence of large free air on this supine. Small right pleural. Surgical changes prior interbody cage placement -S1.  IMPRESSION: Dilated and air-filled loops of small bowel in the mid abdomen concerning for small bowel obstruction.  Small right pleural effusion.   Electronically Signed   By: Jacqulynn Cadet M.D.   On: 10/07/2014 21:47   Ct Abdomen Pelvis W Contrast  09/29/2014   CLINICAL DATA:  Lower abdominal pain  EXAM: CT ABDOMEN AND PELVIS WITH CONTRAST  TECHNIQUE: Multidetector CT imaging of the abdomen and pelvis was performed using the standard protocol following bolus administration of intravenous contrast.  CONTRAST:  80mL OMNIPAQUE IOHEXOL 300 MG/ML SOLN, 63mL OMNIPAQUE IOHEXOL 300 MG/ML SOLN  COMPARISON:  None.  FINDINGS: Lung bases are free of acute infiltrate or sizable effusion.  The liver, gallbladder, spleen, adrenal glands and pancreas are within normal limits. The kidneys are well visualized and demonstrate right renal cystic change stable from the prior exam. Stable renal calculi are noted on the left. 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 It is stable in appearance from the prior exam. Stable scattered atherosclerotic changes are noted throughout the aortoiliac system.  The colon is somewhat distended with air with area of wall thickening and narrowing in the sigmoid colon. This has progressed in the interval from the prior exam. Given findings from recent sigmoidoscopy this likely represents progressive smooth muscle thickening. Clinical correlation is  recommended. The bladder is decompressed. No pelvic mass lesion is noted. The bony structures demonstrate postoperative change in the lumbar spine.  IMPRESSION: Stable cystic and calculus changes in the kidneys bilaterally.  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  Increased colonic distention with air secondary to an area of narrowing in the sigmoid colon. This likely represents progressive smooth muscle thickening and narrowing. The need for direct visualization can be determined on a clinical basis.   Electronically Signed   By: Inez Catalina M.D.   On: 09/29/2014 14:07   US Renal  10/01/2014   CLINICAL DATA:  Acute renal failure.  EXAM: RENAL / URINARY TRACT ULTRASOUND COMPLETE  COMPARISON:  CT 09/29/2014  FINDINGS: Right Kidney:  Length: 10.2 cm. Small 9 mm cyst in the upper pole. Normal echotexture. Normal echotexture no hydronephrosis.  Left Kidney:  Length: 10.1 cm. Normal echotexture. No hydronephrosis. 12 mm cyst in the upper pole. 19 mm hypoechoic parapelvic area in the lower pole, likely small parapelvic cysts. This was seen scratch head this appears to be a cyst on prior CT. Mild cortical thinning within the mid  and upper pole.  Bladder:  Appears normal for degree of bladder distention.  IMPRESSION: Small bilateral renal cysts. No hydronephrosis. No acute findings. Scarring in the mid and upper pole of the left kidney.   Electronically Signed   By: Rolm Baptise M.D.   On: 10/01/2014 14:53   Dg Chest Port 1 View  10/05/2014   CLINICAL DATA:  Hypoxia  EXAM: PORTABLE CHEST - 1 VIEW  COMPARISON:  Radiograph 10/03/2014  FINDINGS: NG tube extends the stomach. Stable mildly enlarged cardiac silhouette. There is interval improvement in basilar atelectasis seen on prior. Mild peripheral linear pattern remains.  IMPRESSION: 1. Improvement in right basilar atelectasis. 2. Mild interstitial edema pattern.   Electronically Signed   By: Suzy Bouchard M.D.   On: 10/05/2014  09:41   Dg Chest Port 1 View  10/03/2014   CLINICAL DATA:  64 year old female with a history of leukocytosis.  EXAM: PORTABLE CHEST - 1 VIEW  COMPARISON:  Chest x-ray 12/20/2012, 08/09/2012. Prior abdominal CT 09/29/2014 out prior chest CT 05/19/2005  FINDINGS: Cardiac diameter unchanged. Right rotation of the patient accentuates the right heart border, with increased opacity along the right-sided mediastinum. Surgical changes in the right hilum.  No pneumothorax.  Elevation of the right hemidiaphragm. Right base not well visualized.  Interstitial opacities of the bilateral lungs.  Gastric tube projects over the mediastinum, terminating out of the field of view.  IMPRESSION: Limited chest x-ray demonstrates bilateral interstitial opacities and right basilar airspace disease, potentially multifocal infection including atypical infections as well as aspiration pneumonitis/pneumonia.  Surgical changes of the right hilar region, status post lobectomy. If the patient has a history of lung carcinoma, further evaluation with chest CT may be useful, as the lung changes are new from the most recent chest x-ray of 12/20/2012.  Gastric tube projects over the mediastinum.  Signed,  Dulcy Fanny. Earleen Newport, DO  [facility]  [facility]   Electronically Signed   By: Corrie Mckusick D.O.   On: 10/03/2014 09:12   Dg Abd Portable 1v  10/14/2014   CLINICAL DATA:  Ileus.  EXAM: PORTABLE ABDOMEN - 1 VIEW  COMPARISON:  CT 10/13/2014.  FINDINGS: Persistent dilated loops of small bowel noted. Colonic gas pattern is normal. These findings suggest mild partial small-bowel obstruction versus adynamic ileus. No free air. Mild residual oral contrast noted. Ostomy site left lower quadrant. Aortoiliac atherosclerotic vascular disease. Prior lumbosacral spine fusion. No acute bony abnormality. Surgical clip right lower quadrant.  IMPRESSION: 1. Persistent dilated loops of small bowel with normal colonic  gas pattern. These findings suggest persistent mild partial small-bowel obstruction versus adynamic ileus. Ostomy site noted left lower quadrant. 2. Aortoiliac atherosclerotic vascular disease.   Electronically Signed   By: Marcello Moores  Register   On: 10/14/2014 07:37    ASSESSMENT AND PLAN  64 year old female with PMH significant for tobacco abuse, COPD HTN, CKD stage III, and rectovaginal fistula presenting with acute abdominal pain and nausea. Felt to be partial colonic obstruction 2/2 benign stricture of colovaginal fistula. Originally planned for diagnostic cath prior to surgery (with delayed PCI later as pt did not have any CP), however renal function worsened. Cath cancelled 6/1. S/p sigmoid colon colectomy with in colostomy takedown of colovaginal fistula 6/2. Postop ileus noted.   1. CAD with abnormal stress test 08/2014  - moderate sized reversible anterior, apical and apical lateral perfusion defect. Intermediate risk study. EF 52%  - had mild NSTEMI over the weekend (borderline  high trop) and some burning sensation in the chest pain, trop trending down, symptom improving.   - will need diagnostic cath, ideally need to make sure ileus resolve and open abdomen with vac closed   2. Large bowel obstruction 2/2 sigmoid colon stricture and colovaginal fistula s/p ex lap with sigmoid colectomy  3. Acute on chronic diastolic HF  - continue have some bibasilar rale, unclear if atelectasis vs HF, Cr stable after 1st dose of IV lasix, will give additional lasix tonight and check CXR in AM  4. Acute on chronic renal insufficiency   5. HTN: uncontrolled, add Imdur  6. HLD 7. COPD 8. Anemia: transfused yesterday   Signed, Woodward Ku Pager: [phone]   I have seen and examined the patient along with Almyra Deforest PA-C.  I have reviewed the chart, notes and new data.  I agree with PA's note.  Key new complaints: no angina, has passed formed stool. Key examination changes: no clinical CHF Key  new findings / data: creat 1.27   PLAN: Ileus has resolved and creatinine is back to baseline. She is still on IV heparin. I would recommend that we proceed with left heart cath before she is discharged to SNF. There is a high likelihood that she has multivessel CAD and has a CABG indication, which would have to wait until abdominal wound has healed and she has recovered strength in rehab. If she has single vessel LAD stenosis, could address with PCI at time of diagnostic cath. There is plan for colostomy takedown, probably 6 months from now. She is considering her options. Will tentatively add to schedule tomorrow.  Sanda Klein, MD, [place] [phone] 10/14/2014, 4:51 PM

## 2014-10-14 NOTE — Progress Notes (Signed)
Nutrition Follow-up  DOCUMENTATION CODES:  Not applicable  INTERVENTION:  -D/c Resource Breeze po TID, each supplement provides 250 kcal and 9 grams of protein, due to poor acceptance -Ensure Enlive po BID, each supplement provides 350 kcal and 20 grams of protein  NUTRITION DIAGNOSIS:  Inadequate oral intake related to altered GI function as evidenced by meal completion < 50%, per patient/family report.  Ongoing  GOAL:  Patient will meet greater than or equal to 90% of their needs  Progressing  MONITOR:  PO intake, Supplement acceptance, Labs, Weight trends, I & O's, Skin  REASON FOR ASSESSMENT:  NPO/Clear Liquid Diet    ASSESSMENT: Patient with history of chronic ongoing abdominal pain secondary to rectal vaginal fistula and sigmoid stricture presented with abdominal pain 7 days prior to admission which was intermittent in nature with associated nausea and also some diarrhea. Patient had decreased appetite. Patient was scheduled for cardiac catheterization on 10/03/2014 for medical clearance for surgical intervention of fistula or stricture. Patient was admitted and general surgery consulted as patient had presented with a colonic obstruction.  Pt s/p exploratory laparotomy with sigmoid colectomy, colovaginal fistula takedown and colostomy on 10/02/14.   Pt walking halls with PT at time of visit.   Spoke with RN who reports pt is not yet ready for discharge. Plan is to change abdominal vac dressing today.   Noted 100 ml output from colostomy this shift.   Pt has been advanced to a soft diet. Intake remains poor, per staff report. Noted pt has been refusing Resource Breeze supplement- RD will d/c due to poor acceptance. Surgery has ordered Ensure and pt accepted morning dose- will continue supplement order.    Labs reviewed. Na: 148.   Height:  Ht Readings from Last 1 Encounters:  09/29/14 5\' 1"  (1.549 m)    Weight:  Wt Readings from Last 1 Encounters:  10/12/14  153 lb 14.4 oz (69.809 kg)    Ideal Body Weight:  47.7 kg  Wt Readings from Last 10 Encounters:  10/12/14 153 lb 14.4 oz (69.809 kg)  09/22/14 143 lb 6.4 oz (65.046 kg)  09/10/14 142 lb (64.411 kg)  08/27/14 142 lb (64.411 kg)  08/11/14 142 lb (64.411 kg)  08/01/14 136 lb (61.689 kg)  07/21/14 137 lb 9.6 oz (62.415 kg)  07/11/14 140 lb (63.504 kg)  05/19/14 138 lb 3.2 oz (62.687 kg)  05/08/14 137 lb 12.8 oz (62.506 kg)    BMI:  Body mass index is 29.09 kg/(m^2).  Estimated Nutritional Needs:  Kcal:  1700-1900  Protein:  85-95 grams  Fluid:  1.7-1.9 L  Skin:  Wound (see comment) (open rt arm wound, back incision, closed abdominal incision, abdominal wound vac)  Diet Order:  DIET SOFT Room service appropriate?: Yes; Fluid consistency:: Thin  EDUCATION NEEDS:  No education needs identified at this time   Intake/Output Summary (Last 24 hours) at 10/14/14 1153 Last data filed at 10/14/14 0600  Gross per 24 hour  Intake   1214 ml  Output    375 ml  Net    839 ml    Last BM:  10/14/14  Martika Egler A. Jimmye Norman, RD, LDN, CDE Pager: [phone] After hours Pager: [phone]

## 2014-10-14 NOTE — Progress Notes (Signed)
[facility] Progress Note  12 Days Post-Op  Subjective: Pt doing well, no N/V today, tolerating small amounts of soft diet well.  Having flatus and BM in ostomy bag.  Says she was urinating frequently after given lasix.  Up OOB some.  Objective: Vital signs in last 24 hours: Temp:  [98.3 F (36.8 C)-99.1 F (37.3 C)] 98.6 F (37 C) (06/14 0502) Pulse Rate:  [67-81] 73 (06/14 0934) Resp:  [18-20] 20 (06/14 0502) BP: (152-180)/(46-69) 172/69 mmHg (06/14 0934) SpO2:  [93 %-99 %] 93 % (06/14 0502) Last BM Date: 10/14/14  Intake/Output from previous day: 06/13 0701 - 06/14 0700 In: 1334 [P.O.:360; I.V.:304; Blood:670] Out: 925 [Urine:300; Drains:75; Stool:550] Intake/Output this shift:    PE: Gen: Alert, NAD, pleasant Abd: Soft, more distension, tenderness in RLQ over heparin hematomas, but also throughout, +BS, no HSM, midline wound with wound vac in place, ostomy purple with redish brown tissue centrally, ostomy has flatus and soft brown stools in bag  Lab Results:   Recent Labs  10/13/14 0505 10/14/14 0351  WBC 13.3* 13.9*  HGB 7.8* 10.5*  HCT 26.0* 33.7*  PLT 277 302   BMET  Recent Labs  10/13/14 1159 10/14/14 0351  NA 147* 148*  K 3.3* 3.9  CL 113* 111  CO2 27 29  GLUCOSE 113* 112*  BUN 10 8  CREATININE 1.31* 1.27*  CALCIUM 8.2* 8.5*   PT/INR No results for input(s): LABPROT, INR in the last 72 hours. CMP     Component Value Date/Time   NA 148* 10/14/2014 0351   NA 145* 01/17/2013 1703   K 3.9 10/14/2014 0351   CL 111 10/14/2014 0351   CO2 29 10/14/2014 0351   GLUCOSE 112* 10/14/2014 0351   GLUCOSE 82 01/17/2013 1703   BUN 8 10/14/2014 0351   BUN 26 01/17/2013 1703   CREATININE 1.27* 10/14/2014 0351   CREATININE 1.69* 11/05/2012 1541   CALCIUM 8.5* 10/14/2014 0351   PROT 7.2 09/29/2014 1107   PROT 6.5 01/17/2013 1703   ALBUMIN 2.2* 10/03/2014 0309   AST 22 09/29/2014 1107   ALT 18 09/29/2014 1107   ALKPHOS 77 09/29/2014 1107    BILITOT 0.3 09/29/2014 1107   GFRNONAA 43* 10/14/2014 0351   GFRNONAA 32* 11/05/2012 1541   GFRAA 50* 10/14/2014 0351   GFRAA 36* 11/05/2012 1541   Lipase     Component Value Date/Time   LIPASE 29 09/29/2014 1108       Studies/Results: Ct Abdomen Pelvis Wo Contrast  10/13/2014   CLINICAL DATA:  64 year old female 10 days post exploratory laparotomy with sigmoid colectomy, colovaginal fistula takedown and colostomy formation. Sigmoid colon stricture noted at the time of surgery. Nausea with vomiting phlegm. Subsequent encounter.  EXAM: CT ABDOMEN AND PELVIS WITHOUT CONTRAST  TECHNIQUE: Multidetector CT imaging of the abdomen and pelvis was performed following the standard protocol without IV contrast.  COMPARISON:  Plain film examination 10/07/2014. Preoperative CT 09/29/2014.  FINDINGS: Post resection portion of the descending colon and sigmoid colon. Rectosigmoid pouch without complication. Left lower abdominal colostomy site without complication.  Subcutaneous edema most notable buttock and upper thigh region greater on the right may represent dependent edema or third spacing of fluid.  Anterior abdominal/pelvic wall incision with gas and mild infiltration of fat planes at the base of the incision site which may represent residua of healing by secondary intent without well-defined drainable abscess.  Contrast and partially gas-filled prominent size loops of small bowel with change of caliber in  the left lower quadrant. This may reflect result of adhesions and mild partial obstruction. The small bowel loops extend towards the base of the incision site but do not appear to enter such.  Basilar subsegmental atelectasis with small left-sided pleural effusion.  Heart size top-normal coronary artery calcifications and aortic root calcifications.  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 Ectasia right common  iliac artery. Narrowing of the left common iliac artery.  Gallstones/gallbladder sludge.  Taking into account limitation by non contrast imaging, no worrisome hepatic, splenic, pancreatic or adrenal lesion.  Scarring of the kidneys greater on the left. Fullness of the renal collecting systems without calcified obstructing stone identified. Left renal nonobstructing calculi and/or vascular calcifications unchanged. Left renal sub cm hyperdense lesions possibly hemorrhagic cysts although incompletely assessed.  Partially contracted noncontrast filled urinary bladder without obvious abnormality.  Postsurgical changes L5-S1.  IMPRESSION: Post resection portion of the descending colon and sigmoid colon. Rectosigmoid pouch without complication. Left lower abdominal colostomy site without complication.  Anterior abdominal/pelvic wall incision with gas and mild infiltration of fat planes at the base of the incision site which may represent residua of healing by secondary intent without well-defined drainable abscess.  Contrast and partially gas-filled prominent size loops of small bowel with change of caliber in the left lower quadrant. This may reflect result of adhesions and mild partial obstruction. The small bowel loops extend towards the base of the incision site but do not appear to enter such.  Basilar subsegmental atelectasis with small left-sided pleural effusion.  Heart size top-normal coronary artery calcifications and aortic root calcifications.  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  Gallstones/gallbladder sludge.  Scarring of the kidneys greater on the left. Fullness of the renal collecting systems without calcified obstructing stone identified.  Dependent edema/third spacing of fluid.   Electronically Signed   By: Genia Del M.D.   On: 10/13/2014 15:44   Dg Abd Portable 1v  10/14/2014    CLINICAL DATA:  Ileus.  EXAM: PORTABLE ABDOMEN - 1 VIEW  COMPARISON:  CT 10/13/2014.  FINDINGS: Persistent dilated loops of small bowel noted. Colonic gas pattern is normal. These findings suggest mild partial small-bowel obstruction versus adynamic ileus. No free air. Mild residual oral contrast noted. Ostomy site left lower quadrant. Aortoiliac atherosclerotic vascular disease. Prior lumbosacral spine fusion. No acute bony abnormality. Surgical clip right lower quadrant.  IMPRESSION: 1. Persistent dilated loops of small bowel with normal colonic gas pattern. These findings suggest persistent mild partial small-bowel obstruction versus adynamic ileus. Ostomy site noted left lower quadrant. 2. Aortoiliac atherosclerotic vascular disease.   Electronically Signed   By: Marcello Moores  Register   On: 10/14/2014 07:37    Anti-infectives: Anti-infectives    Start     Dose/Rate Route Frequency Ordered Stop   10/07/14 1800  cefUROXime (CEFTIN) tablet 500 mg  Status:  Discontinued     500 mg Oral 2 times daily with meals 10/07/14 1203 10/11/14 1611   10/03/14 1530  cefTRIAXone (ROCEPHIN) 1 g in dextrose 5 % 50 mL IVPB - Premix  Status:  Discontinued     1 g 100 mL/hr over 30 Minutes Intravenous Every 24 hours 10/03/14 1454 10/07/14 1203   10/02/14 2200  Levofloxacin (LEVAQUIN) IVPB 250 mg  Status:  Discontinued     250 mg 50 mL/hr over  60 Minutes Intravenous Every 24 hours 10/01/14 2105 10/02/14 2006   10/02/14 2200  Levofloxacin (LEVAQUIN) IVPB 250 mg  Status:  Discontinued     250 mg 50 mL/hr over 60 Minutes Intravenous Every 24 hours 10/02/14 1000 10/03/14 1454   10/02/14 0800  clindamycin (CLEOCIN) IVPB 900 mg     900 mg 100 mL/hr over 30 Minutes Intravenous To [facility] 10/01/14 1238 10/02/14 0933   10/02/14 0800  gentamicin (GARAMYCIN) IVPB 100 mg     100 mg 200 mL/hr over 30 Minutes Intravenous To [facility] 10/01/14 1238 10/02/14 0938   10/01/14 2200  levofloxacin (LEVAQUIN) IVPB 500  mg     500 mg 100 mL/hr over 60 Minutes Intravenous  Once 10/01/14 2059 10/02/14 0104       Assessment/Plan Large bowel obstruction secondary to sigmoid colon stricture and colovaginal fistula  POD #12 exploratory laparotomy with sigmoid colectomy, colovaginal fistula takedown and colostomy--Dr. Brantley Stage -N/V resolved today, she feels better.  Still with ileus. -CT abd/pelvis showed normal post-op changes, dilated loops of small bowel, no acute findings or drainable abscesses -KUB this am showed dilated loops of small bowel, but normal colonic gas patterns, most consistent with ileus. -Continue soft diet as tolerated, ensure -Bowel regimen - start colace and miralax -Antiemetics to manage nausea -Cont PT -Stoma is purple, but viable, monitor, stool output -VAC in place, TTS ID- ceftin for UTI - completed. VTE prophylaxis-SCD/heparin Elevated troponin's - cardiology discontinuing heparin infusion Pulmonary-hypoxia has resolved, CXR ok. Has COPD. Pulling 7104ml on IS which is improved.  FEN-soft, on orals and IV prn pain meds Disp-medical management of nausea, when eating better d/c to SNF   LOS: 15 days    Nat Christen 10/14/2014, 10:06 AM Pager: [phone]

## 2014-10-14 NOTE — Progress Notes (Signed)
ANTICOAGULATION CONSULT NOTE - Follow Up Consult  Pharmacy Consult:  Heparin Indication: chest pain/ACS  Allergies  Allergen Reactions  . Penicillins Rash  . Sulfa Antibiotics Rash    Patient Measurements: Height: 5\' 1"  (154.9 cm) Weight: 153 lb 14.4 oz (69.809 kg) IBW/kg (Calculated) : 47.8 Heparin Dosing Weight: 63 kg  Vital Signs: Temp: 98.6 F (37 C) (06/14 0502) Temp Source: Oral (06/14 0502) BP: 180/60 mmHg (06/14 0502) Pulse Rate: 73 (06/14 0502)  Labs:  Recent Labs  10/12/14 1313  10/13/14 0505 10/13/14 1159 10/13/14 1509 10/13/14 2107 10/14/14 0351  HGB 8.1*  --  7.8*  --   --   --  10.5*  HCT 27.6*  --  26.0*  --   --   --  33.7*  PLT 276  --  277  --   --   --  302  HEPARINUNFRC  --   --  0.20*  --  0.18*  --  <0.10*  CREATININE 1.45*  --   --  1.31*  --   --  1.27*  TROPONINI 0.03  < > 0.03 0.03  --  0.04* 0.05*  < > = values in this interval not displayed.  Estimated Creatinine Clearance: 38.9 mL/min (by C-G formula based on Cr of 1.27).     Assessment: 59 YOF continues on IV heparin for ACS.  Cath was canceled on 10/01/14 due to AKI, but plan to proceed once renal function normalizes and hemoglobin stabilizes.  Heparin level is undetectable this AM and RN reported no complication with infusion.  No overt bleeding reported although patient did receive transfusion on 10/13/14.  Noted her troponin is trending up again.   Goal of Therapy:  Heparin level 0.3-0.7 units/ml Monitor platelets by anticoagulation protocol: Yes    Plan:  - Increase heparin gtt to 1200 units/hr - Check 6 hr HL - Daily HL / CBC - F/U resume home med Pravachol, discontinue heparin and start Imdur per Cards recommendations    Mailin Coglianese D. Mina Marble, PharmD, BCPS Pager:  [phone] 10/14/2014, 7:21 AM

## 2014-10-14 NOTE — Progress Notes (Signed)
PHARMACY NOTE  Pharmacy Consult :  64 y.o. female is currently on Heparin infusion for ACS/chest pain.   Heparin Dosing Wt :  63 kg  LABS :  Recent Labs  10/12/14 1313 10/13/14 0505 10/13/14 1159 10/13/14 1509 10/14/14 0351 10/14/14 1424  HGB 8.1* 7.8*  --   --  10.5*  --   HCT 27.6* 26.0*  --   --  33.7*  --   PLT 276 277  --   --  302  --   HEPARINUNFRC  --  0.20*  --  0.18* <0.10* 0.30  CREATININE 1.45*  --  1.31*  --  1.27*  --     MEDICATION: Infusion[s]: Infusions:  . heparin 1,200 Units/hr (10/14/14 0731)   ASSESSMENT :  64 y.o. female remains on Heparin for ACS/chest pain   Heparin infusing at 1200 units/hr.  Heparin level 0.3 units/ml which is within therapeutic range.  No evidence of bleeding complications observed.  GOAL :  Heparin Level  0.3 - 0.7 units/ml  PLAN : 1. Continue Heparin infusion at current rate, 1200 units/hr. 2. The next Heparin Level in 8 hours to confirm therapeutic levels. 3. Daily Heparin level, CBC while on Heparin.  Monitor for bleeding complications. Follow Platelet counts.  Marthenia Rolling,  Pharm.D   10/14/2014,  4:32 PM

## 2014-10-14 NOTE — Progress Notes (Signed)
Occupational Therapy Treatment Patient Details Name: Alexa Key MRN: [id number] DOB: 09-17-1950 Today's Date: 10/14/2014    History of present illness Patient is a 64 y/o female s/p exploratory laparotomy with sigmoid colectomy, colovaginal fistula takedown and colostomy 6/2. PMH includes CAD,PVD, CVA, HTN,COPD, tobacco use, fibromyalgia and DM.   OT comments  Pt requiring encouragement for activity. Performed bed mobility, seated grooming at EOB and toileting.  Educated pt on benefits of activity. Will continue to follow.  Follow Up Recommendations  SNF;Supervision/Assistance - 24 hour    Equipment Recommendations       Recommendations for Other Services      Precautions / Restrictions Precautions Precautions: Fall       Mobility Bed Mobility Overal bed mobility: Needs Assistance   Rolling: Min assist Sidelying to sit: Mod assist     Sit to sidelying: Mod assist General bed mobility comments: instructed in log roll method to decrease abdominal pain  Transfers       Sit to Stand: Min guard Stand pivot transfers: Min guard            Balance                                   ADL Overall ADL's : Needs assistance/impaired     Grooming: Set up;Wash/dry hands;Wash/dry face;Brushing hair                   Toilet Transfer: Min Camera operator and Hygiene: Min guard         General ADL Comments: Only agreeable to EOB grooming and use of BSC due to pain.  RN provided pain meds.  PT to follow OT.  Educated pt on benefits of OOB activity.  Opened window blinds as pt was in the bed in the dark.      Vision                     Perception     Praxis      Cognition   Behavior During Therapy: WFL for tasks assessed/performed Overall Cognitive Status: Within Functional Limits for tasks assessed                       Extremity/Trunk Assessment               Exercises      Shoulder Instructions       General Comments      Pertinent Vitals/ Pain       Pain Assessment: Faces Faces Pain Scale: Hurts even more Pain Location: abdomen Pain Descriptors / Indicators: Sore Pain Intervention(s): Limited activity within patient's tolerance;Monitored during session;Patient requesting pain meds-RN notified;Repositioned  Home Living                                          Prior Functioning/Environment              Frequency Min 2X/week     Progress Toward Goals  OT Goals(current goals can now be found in the care plan section)  Progress towards OT goals: Progressing toward goals  Acute Rehab OT Goals Patient Stated Goal: to live independently again Time For Goal Achievement: 10/21/14 Potential to Achieve Goals: Good  Plan Discharge plan remains appropriate  Co-evaluation                 End of Session Equipment Utilized During Treatment: Oxygen;Rolling walker   Activity Tolerance Patient limited by fatigue   Patient Left in bed;with call bell/phone within reach   Nurse Communication Patient requests pain meds        Time: 8891-6945 OT Time Calculation (min): 17 min  Charges: OT General Charges $OT Visit: 1 Procedure OT Treatments $Self Care/Home Management : 8-22 mins  Malka So 10/14/2014, 11:53 AM  [phone]

## 2014-10-15 ENCOUNTER — Inpatient Hospital Stay (HOSPITAL_COMMUNITY): Payer: Commercial Managed Care - HMO

## 2014-10-15 ENCOUNTER — Encounter (HOSPITAL_COMMUNITY)
Admission: EM | Disposition: A | Discharge status: Skilled Nursing Facility | Payer: Commercial Managed Care - HMO | Source: Home / Self Care | Attending: Internal Medicine

## 2014-10-15 ENCOUNTER — Other Ambulatory Visit: Payer: Self-pay | Admitting: Physician Assistant

## 2014-10-15 DIAGNOSIS — N179 Acute kidney failure, unspecified: Secondary | ICD-10-CM

## 2014-10-15 DIAGNOSIS — N390 Urinary tract infection, site not specified: Secondary | ICD-10-CM

## 2014-10-15 DIAGNOSIS — N189 Chronic kidney disease, unspecified: Secondary | ICD-10-CM

## 2014-10-15 DIAGNOSIS — R0902 Hypoxemia: Secondary | ICD-10-CM

## 2014-10-15 DIAGNOSIS — B9620 Unspecified Escherichia coli [E. coli] as the cause of diseases classified elsewhere: Secondary | ICD-10-CM

## 2014-10-15 HISTORY — PX: CARDIAC CATHETERIZATION: SHX172

## 2014-10-15 LAB — CBC
HEMATOCRIT: 33.5 % — AB (ref 36.0–46.0)
HEMOGLOBIN: 10.3 g/dL — AB (ref 12.0–15.0)
MCH: 29.3 pg (ref 26.0–34.0)
MCHC: 30.7 g/dL (ref 30.0–36.0)
MCV: 95.4 fL (ref 78.0–100.0)
Platelets: 294 10*3/uL (ref 150–400)
RBC: 3.51 MIL/uL — ABNORMAL LOW (ref 3.87–5.11)
RDW: 17.8 % — ABNORMAL HIGH (ref 11.5–15.5)
WBC: 11 10*3/uL — ABNORMAL HIGH (ref 4.0–10.5)

## 2014-10-15 LAB — HEPARIN LEVEL (UNFRACTIONATED)
Heparin Unfractionated: 0.22 IU/mL — ABNORMAL LOW (ref 0.30–0.70)
Heparin Unfractionated: 0.21 IU/mL — ABNORMAL LOW (ref 0.30–0.70)

## 2014-10-15 LAB — BASIC METABOLIC PANEL
ANION GAP: 9 (ref 5–15)
BUN: 6 mg/dL (ref 6–20)
CHLORIDE: 108 mmol/L (ref 101–111)
CO2: 29 mmol/L (ref 22–32)
Calcium: 8.5 mg/dL — ABNORMAL LOW (ref 8.9–10.3)
Creatinine, Ser: 1.39 mg/dL — ABNORMAL HIGH (ref 0.44–1.00)
GFR calc Af Amer: 45 mL/min — ABNORMAL LOW (ref >60)
GFR calc non Af Amer: 39 mL/min — ABNORMAL LOW (ref >60)
Glucose, Bld: 99 mg/dL (ref 65–99)
POTASSIUM: 3.6 mmol/L (ref 3.5–5.1)
SODIUM: 146 mmol/L — AB (ref 135–145)

## 2014-10-15 SURGERY — LEFT HEART CATH AND CORONARY ANGIOGRAPHY
Anesthesia: LOCAL

## 2014-10-15 MED ORDER — SODIUM CHLORIDE 0.9 % IV SOLN: 250.0000 mL | INTRAVENOUS | Status: DC | PRN

## 2014-10-15 MED ORDER — NITROGLYCERIN 1 MG/10 ML FOR IR/CATH LAB
INTRA_ARTERIAL | Status: AC
  Filled 2014-10-15: qty 10

## 2014-10-15 MED ORDER — ACETAMINOPHEN 325 MG PO TABS: 650.0000 mg | ORAL_TABLET | ORAL | Status: DC | PRN

## 2014-10-15 MED ORDER — SODIUM CHLORIDE 0.9 % IJ SOLN: 3.0000 mL | INTRAMUSCULAR | Status: DC | PRN

## 2014-10-15 MED ORDER — HEPARIN SODIUM (PORCINE) 1000 UNIT/ML IJ SOLN
INTRAMUSCULAR | Status: DC | PRN
  Administered 2014-10-15: 3000 [IU] via INTRAVENOUS

## 2014-10-15 MED ORDER — MIDAZOLAM HCL 2 MG/2ML IJ SOLN
INTRAMUSCULAR | Status: AC
  Filled 2014-10-15: qty 2

## 2014-10-15 MED ORDER — HEPARIN (PORCINE) IN NACL 2-0.9 UNIT/ML-% IJ SOLN
INTRAMUSCULAR | Status: AC
  Filled 2014-10-15: qty 1500

## 2014-10-15 MED ORDER — FENTANYL CITRATE (PF) 100 MCG/2ML IJ SOLN
INTRAMUSCULAR | Status: DC | PRN
  Administered 2014-10-15: 50 ug via INTRAVENOUS
  Administered 2014-10-15 (×2): 25 ug via INTRAVENOUS

## 2014-10-15 MED ORDER — SODIUM CHLORIDE 0.9 % IV SOLN
INTRAVENOUS | Status: AC
  Administered 2014-10-15: 20:00:00 via INTRAVENOUS

## 2014-10-15 MED ORDER — ASPIRIN 325 MG PO TABS
325.0000 mg | ORAL_TABLET | Freq: Every day | ORAL | Status: DC
  Administered 2014-10-16 – 2014-10-21 (×6): 325 mg via ORAL
  Filled 2014-10-15 (×6): qty 1

## 2014-10-15 MED ORDER — SODIUM CHLORIDE 0.9 % WEIGHT BASED INFUSION: 1.0000 mL/kg/h | INTRAVENOUS | Status: DC

## 2014-10-15 MED ORDER — ASPIRIN 81 MG PO CHEW: 81.0000 mg | CHEWABLE_TABLET | ORAL | Status: DC

## 2014-10-15 MED ORDER — HEPARIN (PORCINE) IN NACL 100-0.45 UNIT/ML-% IJ SOLN
1500.0000 [IU]/h | INTRAMUSCULAR | Status: DC
  Administered 2014-10-15: 1500 [IU]/h via INTRAVENOUS
  Filled 2014-10-15 (×2): qty 250

## 2014-10-15 MED ORDER — IOHEXOL 300 MG/ML  SOLN
INTRAMUSCULAR | Status: DC | PRN
  Administered 2014-10-15: 60 mL via INTRAVENOUS

## 2014-10-15 MED ORDER — SODIUM CHLORIDE 0.9 % IJ SOLN
3.0000 mL | Freq: Two times a day (BID) | INTRAMUSCULAR | Status: DC
Start: 2014-10-15 — End: 2014-10-16

## 2014-10-15 MED ORDER — LIDOCAINE HCL (PF) 1 % IJ SOLN
INTRAMUSCULAR | Status: AC
  Filled 2014-10-15: qty 30

## 2014-10-15 MED ORDER — VERAPAMIL HCL 2.5 MG/ML IV SOLN
INTRAVENOUS | Status: DC | PRN
  Administered 2014-10-15 (×2): via INTRA_ARTERIAL

## 2014-10-15 MED ORDER — VERAPAMIL HCL 2.5 MG/ML IV SOLN
INTRAVENOUS | Status: AC
  Filled 2014-10-15: qty 2

## 2014-10-15 MED ORDER — VERAPAMIL HCL 2.5 MG/ML IV SOLN: INTRAVENOUS | Status: DC | PRN

## 2014-10-15 MED ORDER — HEPARIN SODIUM (PORCINE) 5000 UNIT/ML IJ SOLN
5000.0000 [IU] | Freq: Three times a day (TID) | INTRAMUSCULAR | Status: DC
  Administered 2014-10-16 – 2014-10-21 (×17): 5000 [IU] via SUBCUTANEOUS
  Filled 2014-10-15 (×15): qty 1

## 2014-10-15 MED ORDER — SODIUM CHLORIDE 0.9 % IJ SOLN: 3.0000 mL | Freq: Two times a day (BID) | INTRAMUSCULAR | Status: DC

## 2014-10-15 MED ORDER — FENTANYL CITRATE (PF) 100 MCG/2ML IJ SOLN
INTRAMUSCULAR | Status: AC
  Filled 2014-10-15: qty 2

## 2014-10-15 MED ORDER — MIDAZOLAM HCL 2 MG/2ML IJ SOLN
INTRAMUSCULAR | Status: DC | PRN
Start: 2014-10-15 — End: 2014-10-15
  Administered 2014-10-15: 2 mg via INTRAVENOUS
  Administered 2014-10-15: 1 mg via INTRAVENOUS
  Administered 2014-10-15: 2 mg via INTRAVENOUS

## 2014-10-15 SURGICAL SUPPLY — 18 items
CATH INFINITI 5 FR JL3.5 (CATHETERS) ×2 IMPLANT
CATH INFINITI 5FR ANG PIGTAIL (CATHETERS) ×2 IMPLANT
CATH INFINITI 5FR JL4 (CATHETERS) ×2 IMPLANT
CATH INFINITI 5FR MULTPACK ANG (CATHETERS) IMPLANT
CATH INFINITI JR4 5F (CATHETERS) ×2 IMPLANT
DEVICE RAD COMP TR BAND LRG (VASCULAR PRODUCTS) ×2 IMPLANT
GLIDESHEATH SLEND SS 6F .021 (SHEATH) ×3 IMPLANT
GUIDEWIRE ANGLED .035X150CM (WIRE) ×1 IMPLANT
KIT HEART LEFT (KITS) ×2 IMPLANT
PACK CARDIAC CATHETERIZATION (CUSTOM PROCEDURE TRAY) ×2 IMPLANT
SHEATH PINNACLE 5F 10CM (SHEATH) IMPLANT
SYR MEDRAD MARK V 150ML (SYRINGE) ×2 IMPLANT
TRANSDUCER W/STOPCOCK (MISCELLANEOUS) ×2 IMPLANT
TUBING CIL FLEX 10 FLL-RA (TUBING) ×2 IMPLANT
WIRE COUGAR XT STRL 190CM (WIRE) ×1 IMPLANT
WIRE EMERALD 3MM-J .035X150CM (WIRE) IMPLANT
WIRE HI TORQ VERSACORE-J 145CM (WIRE) ×1 IMPLANT
WIRE SAFE-T 1.5MM-J .035X260CM (WIRE) ×2 IMPLANT

## 2014-10-15 NOTE — Interval H&P Note (Signed)
Cath Lab Visit (complete for each Cath Lab visit)  Clinical Evaluation Leading to the Procedure:   ACS: Yes.    Non-ACS:    Anginal Classification: CCS IV  Anti-ischemic medical therapy: Maximal Therapy (2 or more classes of medications)  Non-Invasive Test Results: Intermediate-risk stress test findings: cardiac mortality 1-3%/year  Prior CABG: No previous CABG      History and Physical Interval Note:  10/15/2014 4:20 PM  Alexa Key  has presented today for surgery, with the diagnosis of cp  The various methods of treatment have been discussed with the patient and family. After consideration of risks, benefits and other options for treatment, the patient has consented to  Procedure(s): Left Heart Cath and Coronary Angiography (N/A) as a surgical intervention .  The patient's history has been reviewed, patient examined, no change in status, stable for surgery.  I have reviewed the patient's chart and labs.  Questions were answered to the patient's satisfaction.     Sherren Mocha

## 2014-10-15 NOTE — H&P (View-Only) (Signed)
Patient Name: LORRINE KILLILEA Date of Encounter: 10/15/2014  Primary Cardiologist: J. 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   CURRENT MEDS . amLODipine  10 mg Oral Daily  . aspirin  325 mg Oral Daily  . budesonide-formoterol  2 puff Inhalation BID  . docusate sodium  100 mg Oral BID  . feeding supplement (ENSURE ENLIVE)  237 mL Oral BID BM  . isosorbide mononitrate  30 mg Oral Daily  . lip balm  1 application Topical BID  . metoprolol tartrate  12.5 mg Oral BID  . nicotine  7 mg Transdermal Daily  . pantoprazole  40 mg Oral Daily  . pneumococcal 23 valent vaccine  0.5 mL Intramuscular Tomorrow-1000  . polyethylene glycol  17 g Oral Daily  . saccharomyces boulardii  250 mg Oral BID  . sucralfate  1 g Oral TID WC & HS  . tiotropium  18 mcg Inhalation Daily    OBJECTIVE  Filed Vitals:   10/14/14 1400 10/14/14 2112 10/15/14 0531 10/15/14 0857  BP: 170/62 140/57 139/49 148/55  Pulse: 72 75 63 67  Temp: 98.5 F (36.9 C) 98.7 F (37.1 C) 98.8 F (37.1 C)   TempSrc: Oral Oral Oral   Resp: 20 20 18    Height:      Weight:      SpO2: 94% 97% 96%     Intake/Output Summary (Last 24 hours) at 10/15/14 0934 Last data filed at 10/15/14 0535  Gross per 24 hour  Intake      0 ml  Output    375 ml  Net   -375 ml   Filed Weights   09/30/14 0534 10/01/14  0602 10/12/14 0524  Weight: 146 lb 9.6 oz (66.497 kg) 147 lb (66.679 kg) 153 lb 14.4 oz (69.809 kg)    PHYSICAL EXAM  General: Pleasant, NAD. Neuro: Alert and oriented X 3. Moves all extremities spontaneously. Psych: Normal affect. HEENT:  Normal  Neck: Supple without bruits or JVD. Lungs:  Resp regular and unlabored. CTA Heart: RRR no s3, s4, or murmurs. Abdomen: Skin drain in place, colostomy bad in place Extremities: No clubbing, cyanosis or edema. DP/PT/Radials 2+ and equal bilaterally.  Accessory Clinical Findings  CBC  Recent Labs  10/14/14 0351 10/15/14 0753  WBC 13.9* 11.0*  HGB 10.5* 10.3*  HCT 33.7* 33.5*  MCV 94.7 95.4  PLT 302 660   Basic Metabolic Panel  Recent Labs  10/14/14 0351 10/15/14 0753  NA 148* 146*  K 3.9 3.6  CL 111 108  CO2 29 29  GLUCOSE 112* 99  BUN 8 6  CREATININE 1.27* 1.39*  CALCIUM 8.5* 8.5*   Cardiac Enzymes  Recent Labs  10/13/14 1159 10/13/14 2107 10/14/14 0351  TROPONINI 0.03 0.04* 0.05*   TELE NSR with episode of 11 beats run of NSVT and occasional PACs  ECG  No new EKG  Echocardiogram 08/07/2012  LV EF: 50% -  55%  ------------------------------------------------------------ History:  PMH: NSTEMI. Coronary artery disease. Risk factors: History of CVA. Current tobacco use. Hypertension. Diabetes mellitus. Dyslipidemia.  ------------------------------------------------------------ Study Conclusions  - Left ventricle: The cavity size was normal. Wall thickness was increased in a pattern of mild LVH. Systolic function was normal. The estimated ejection fraction was in the range of 50% to 55%. Wall motion was normal; there were no regional wall motion abnormalities. Features are consistent with a pseudonormal left ventricular filling pattern, with concomitant abnormal relaxation and increased filling pressure (grade 2 diastolic dysfunction). Doppler parameters are consistent with  high ventricular filling pressure. - Mitral valve: Calcified annulus. - Left atrium: The atrium was moderately dilated. - Pulmonary arteries: Systolic pressure was mildly increased. PA peak pressure: 66mm Hg (S).    Radiology/Studies  Ct Abdomen Pelvis Wo Contrast  10/13/2014   CLINICAL DATA:  64 year old female 10 days post exploratory laparotomy with sigmoid colectomy, colovaginal fistula takedown and colostomy formation. Sigmoid colon stricture noted at the time of surgery. Nausea with vomiting phlegm. Subsequent encounter.  EXAM: CT ABDOMEN AND PELVIS WITHOUT CONTRAST  TECHNIQUE: Multidetector CT imaging of the abdomen and pelvis was performed following the standard protocol without IV contrast.  COMPARISON:  Plain film examination 10/07/2014. Preoperative CT 09/29/2014.  FINDINGS: Post resection portion of the descending colon and sigmoid colon. Rectosigmoid pouch without complication. Left lower abdominal colostomy site without complication.  Subcutaneous edema most notable buttock and upper thigh region greater on the right may represent dependent edema or third spacing of fluid.  Anterior abdominal/pelvic wall incision with gas and mild infiltration of fat planes at the base of the incision site which may represent residua of healing by secondary intent without well-defined drainable abscess.  Contrast and partially gas-filled prominent size loops of small bowel with change of caliber in the left lower quadrant. This may reflect result of adhesions and mild partial obstruction. The small bowel loops extend towards the base of the incision site but do not appear to enter such.  Basilar subsegmental atelectasis with small left-sided pleural effusion.  Heart size top-normal coronary artery calcifications and aortic root calcifications.  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 Ectasia right common iliac  artery. Narrowing of the left common iliac artery.  Gallstones/gallbladder sludge.  Taking into account limitation by non contrast imaging, no worrisome hepatic, splenic, pancreatic or adrenal lesion.  Scarring of the kidneys greater on the left. Fullness of the renal collecting systems without calcified obstructing stone identified. Left renal nonobstructing calculi and/or vascular calcifications unchanged. Left renal sub cm hyperdense lesions possibly hemorrhagic cysts although incompletely assessed.  Partially contracted noncontrast filled urinary bladder without obvious abnormality.  Postsurgical changes L5-S1.  IMPRESSION: Post resection portion of the descending colon and sigmoid colon. Rectosigmoid pouch without complication. Left lower abdominal colostomy site without complication.  Anterior abdominal/pelvic wall incision with gas and mild infiltration of fat planes at the base of the incision site which may represent residua of healing by secondary intent without well-defined drainable abscess.  Contrast and partially gas-filled prominent size loops of small bowel with change of caliber in the left lower quadrant. This may reflect result of adhesions and mild partial obstruction. The small bowel loops extend towards the base of the incision site but do not appear to enter such.  Basilar subsegmental atelectasis with small left-sided pleural  effusion.  Heart size top-normal coronary artery calcifications and aortic root calcifications.  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  Gallstones/gallbladder sludge.  Scarring of the kidneys greater on the left. Fullness of the renal collecting systems without calcified obstructing stone identified.  Dependent edema/third spacing of fluid.   Electronically Signed   By: Genia Del M.D.   On: 10/13/2014 15:44   Dg Chest 1 View  10/15/2014   CLINICAL DATA:   Chest rales  EXAM: CHEST  1 VIEW  COMPARISON:  10/05/2014  FINDINGS: Chronic pleural scarring in the right lung base is stable from prior studies. NG tube is been removed  Cardiac enlargement. Negative for heart failure or pneumonia. Left lung is clear.  IMPRESSION: No active disease.   Electronically Signed   By: Franchot Gallo M.D.   On: 10/15/2014 09:02   Dg Abd 1 View  10/07/2014   CLINICAL DATA:  64 year old with vomiting. Recent abdominal surgery.  EXAM: ABDOMEN - 1 VIEW  COMPARISON:  Prior CT scan of the abdomen and pelvis 09/29/2014  FINDINGS: Multiple dilated air-filled loops of small bowel noted throughout abdomen. Left lower quadrant noted. No evidence of large free air on this supine. Small right pleural. Surgical changes prior interbody cage placement -S1.  IMPRESSION: Dilated and air-filled loops of small bowel in the mid abdomen concerning for small bowel obstruction.  Small right pleural effusion.   Electronically Signed   By: Jacqulynn Cadet M.D.   On: 10/07/2014 21:47   Ct Abdomen Pelvis W Contrast  09/29/2014   CLINICAL DATA:  Lower abdominal pain  EXAM: CT ABDOMEN AND PELVIS WITH CONTRAST  TECHNIQUE: Multidetector CT imaging of the abdomen and pelvis was performed using the standard protocol following bolus administration of intravenous contrast.  CONTRAST:  76mL OMNIPAQUE IOHEXOL 300 MG/ML SOLN, 67mL OMNIPAQUE IOHEXOL 300 MG/ML SOLN  COMPARISON:  None.  FINDINGS: Lung bases are free of acute infiltrate or sizable effusion.  The liver, gallbladder, spleen, adrenal glands and pancreas are within normal limits. The kidneys are well visualized and demonstrate right renal cystic change stable from the prior exam. Stable renal calculi are noted on the left. 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 It is stable in appearance from the prior exam. Stable scattered atherosclerotic changes are noted throughout the aortoiliac system.  The  colon is somewhat distended with air with area of wall thickening and narrowing in the sigmoid colon. This has progressed in the interval from the prior exam. Given findings from recent sigmoidoscopy this likely represents progressive smooth muscle thickening. Clinical correlation is recommended. The bladder is decompressed. No pelvic mass lesion is noted. The bony structures demonstrate postoperative change in the lumbar spine.  IMPRESSION: Stable cystic and calculus changes in the kidneys bilaterally.  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  Increased colonic distention with air secondary to an area of narrowing in the sigmoid colon. This likely represents progressive smooth muscle thickening and narrowing. The need for direct visualization can be determined on a clinical basis.   Electronically Signed   By: Inez Catalina M.D.   On: 09/29/2014 14:07   US Renal  10/01/2014   CLINICAL DATA:  Acute renal failure.  EXAM: RENAL / URINARY TRACT ULTRASOUND COMPLETE  COMPARISON:  CT 09/29/2014  FINDINGS: Right Kidney:  Length: 10.2 cm.  Small 9 mm cyst in the upper pole. Normal echotexture. Normal echotexture no hydronephrosis.  Left Kidney:  Length: 10.1 cm. Normal echotexture. No hydronephrosis. 12 mm cyst in the upper pole. 19 mm hypoechoic parapelvic area in the lower pole, likely small parapelvic cysts. This was seen scratch head this appears to be a cyst on prior CT. Mild cortical thinning within the mid and upper pole.  Bladder:  Appears normal for degree of bladder distention.  IMPRESSION: Small bilateral renal cysts. No hydronephrosis. No acute findings. Scarring in the mid and upper pole of the left kidney.   Electronically Signed   By: Rolm Baptise M.D.   On: 10/01/2014 14:53   Dg Chest Port 1 View  10/05/2014   CLINICAL DATA:  Hypoxia  EXAM: PORTABLE CHEST - 1 VIEW  COMPARISON:  Radiograph 10/03/2014  FINDINGS: NG tube extends the stomach. Stable mildly enlarged  cardiac silhouette. There is interval improvement in basilar atelectasis seen on prior. Mild peripheral linear pattern remains.  IMPRESSION: 1. Improvement in right basilar atelectasis. 2. Mild interstitial edema pattern.   Electronically Signed   By: Suzy Bouchard M.D.   On: 10/05/2014 09:41   Dg Chest Port 1 View  10/03/2014   CLINICAL DATA:  64 year old female with a history of leukocytosis.  EXAM: PORTABLE CHEST - 1 VIEW  COMPARISON:  Chest x-ray 12/20/2012, 08/09/2012. Prior abdominal CT 09/29/2014 out prior chest CT 05/19/2005  FINDINGS: Cardiac diameter unchanged. Right rotation of the patient accentuates the right heart border, with increased opacity along the right-sided mediastinum. Surgical changes in the right hilum.  No pneumothorax.  Elevation of the right hemidiaphragm. Right base not well visualized.  Interstitial opacities of the bilateral lungs.  Gastric tube projects over the mediastinum, terminating out of the field of view.  IMPRESSION: Limited chest x-ray demonstrates bilateral interstitial opacities and right basilar airspace disease, potentially multifocal infection including atypical infections as well as aspiration pneumonitis/pneumonia.  Surgical changes of the right hilar region, status post lobectomy. If the patient has a history of lung carcinoma, further evaluation with chest CT may be useful, as the lung changes are new from the most recent chest x-ray of 12/20/2012.  Gastric tube projects over the mediastinum.  Signed,  Dulcy Fanny. Earleen Newport, DO  [facility]  [facility]   Electronically Signed   By: Corrie Mckusick D.O.   On: 10/03/2014 09:12   Dg Abd Portable 1v  10/14/2014   CLINICAL DATA:  Ileus.  EXAM: PORTABLE ABDOMEN - 1 VIEW  COMPARISON:  CT 10/13/2014.  FINDINGS: Persistent dilated loops of small bowel noted. Colonic gas pattern is normal. These findings suggest mild partial small-bowel obstruction versus adynamic ileus. No free  air. Mild residual oral contrast noted. Ostomy site left lower quadrant. Aortoiliac atherosclerotic vascular disease. Prior lumbosacral spine fusion. No acute bony abnormality. Surgical clip right lower quadrant.  IMPRESSION: 1. Persistent dilated loops of small bowel with normal colonic gas pattern. These findings suggest persistent mild partial small-bowel obstruction versus adynamic ileus. Ostomy site noted left lower quadrant. 2. Aortoiliac atherosclerotic vascular disease.   Electronically Signed   By: Marcello Moores  Register   On: 10/14/2014 07:37    ASSESSMENT AND PLAN  64 year old female with PMH significant for tobacco abuse, COPD HTN, CKD stage III, and rectovaginal fistula presenting with acute abdominal pain and nausea. Felt to be partial colonic obstruction 2/2 benign stricture of colovaginal fistula. Originally planned for diagnostic cath prior to surgery (with delayed PCI  later as pt did not have any CP), however renal function worsened. Cath cancelled 6/1. S/p sigmoid colon colectomy with in colostomy takedown of colovaginal fistula 6/2. Postop ileus noted.   1. CAD with abnormal stress test 08/2014  - moderate sized reversible anterior, apical and apical lateral perfusion defect. Intermediate risk study. EF 52%  - had mild NSTEMI over the weekend (borderline high trop) and some burning sensation in the chest pain, trop trending down, symptom improving.   - discussed with MD and surgery PA, noted that she is cleared by surgery to take ASA and plavix for 1 yr is necessary, it is not open abdomen per surgery, just skin vac and well healed too. No other contraindication to stent. hgb stable. Will proceed with cath +/- PCI today  2. Large bowel obstruction 2/2 sigmoid colon stricture and colovaginal fistula s/p ex lap with sigmoid colectomy  3. Acute on chronic diastolic HF  - continue have some bibasilar rale, unclear if atelectasis vs HF, Cr stable after 1st dose of IV lasix, will give  additional lasix tonight and check CXR in AM  4. Acute on chronic renal insufficiency   5. HTN: uncontrolled, added Imdur  6. HLD  7. COPD  8. Anemia: transfused recently   Signed, Almyra Deforest PA-C Pager: [phone]  Agree with note by Almyra Deforest PA-C  Pt s/p abd surgery with intermediate risk myoview done preoperatively, CP recently with inferolateral TWI and mildly + enz. Needs diagnostic cath. H/O previous PCI. Can be done radially. Apparently has more bleeding stoma and needs to have this revised tomorrow. Given above data, I favor cor angio to risk stratify prior to taking back to OR. I understand that DAPT/anti coag may be an issue if high risk anatomy. Discussed with patient.   Lorretta Harp, M.D., [place], [facility], [place] [address] [place], [place]  [address]  [phone] 10/15/2014 3:03 PM

## 2014-10-15 NOTE — Progress Notes (Signed)
ANTICOAGULATION CONSULT NOTE - Follow Up Consult  Pharmacy Consult:  Heparin Indication: chest pain/ACS  Allergies  Allergen Reactions  . Penicillins Rash  . Sulfa Antibiotics Rash    Patient Measurements: Height: 5\' 1"  (154.9 cm) Weight: 153 lb 14.4 oz (69.809 kg) IBW/kg (Calculated) : 47.8 Heparin Dosing Weight: 63 kg  Vital Signs: Temp: 98.8 F (37.1 C) (06/15 0531) Temp Source: Oral (06/15 0531) BP: 148/55 mmHg (06/15 0857) Pulse Rate: 67 (06/15 0857)  Labs:  Recent Labs  10/12/14 1313  10/13/14 0505 10/13/14 1159  10/13/14 2107 10/14/14 0351 10/14/14 1424 10/14/14 2302 10/15/14 0753  HGB 8.1*  --  7.8*  --   --   --  10.5*  --   --  10.3*  HCT 27.6*  --  26.0*  --   --   --  33.7*  --   --  33.5*  PLT 276  --  277  --   --   --  302  --   --  294  HEPARINUNFRC  --   --  0.20*  --   < >  --  <0.10* 0.30 0.22* 0.21*  CREATININE 1.45*  --   --  1.31*  --   --  1.27*  --   --   --   TROPONINI 0.03  < > 0.03 0.03  --  0.04* 0.05*  --   --   --   < > = values in this interval not displayed.  Estimated Creatinine Clearance: 38.9 mL/min (by C-G formula based on Cr of 1.27).     Assessment: 69 YOF continues on IV heparin for ACS.  Cath was canceled on 10/01/14 due to AKI, but plan to proceed today given improving renal function.  Heparin level is remains sub-therapeutic despite multiple rate increases.  No overt bleeding reported.   Goal of Therapy:  Heparin level 0.3-0.7 units/ml Monitor platelets by anticoagulation protocol: Yes    Plan:  - Increase heparin gtt to 1500 units/hr - Check 6 hr HL vs f/u post cath - Daily HL / CBC - F/U resume home med Pravachol    Vincenzina Jagoda D. Mina Marble, PharmD, BCPS Pager:  [phone] 10/15/2014, 9:10 AM

## 2014-10-15 NOTE — Progress Notes (Signed)
[facility] Progress Note  13 Days Post-Op  Subjective: Pt says her appetite is improved.  No more N/V, tolerating soft diet.  +Flauts and BM's in ostomy bag.  Says she has pain at her ostomy site.  Pending heart cath.    Objective: Vital signs in last 24 hours: Temp:  [98.5 F (36.9 C)-98.8 F (37.1 C)] 98.8 F (37.1 C) (06/15 0531) Pulse Rate:  [63-75] 67 (06/15 0857) Resp:  [18-20] 18 (06/15 0531) BP: (139-172)/(49-69) 148/55 mmHg (06/15 0857) SpO2:  [94 %-97 %] 96 % (06/15 0531) Last BM Date: 10/14/14  Intake/Output from previous day: 06/14 0701 - 06/15 0700 In: -  Out: 375 [Urine:375] Intake/Output this shift:    PE: Gen: Alert, NAD, pleasant Abd: Soft, ND, NT, +BS, no HSM, midline wound with wound vac in place, Ostomy is having some mucocutaneous separation with undermining and surface seems to be sloughing off nearly 100 yellow slough today.   10/15/14 - colostomy   Lab Results:   Recent Labs  10/14/14 0351 10/15/14 0753  WBC 13.9* 11.0*  HGB 10.5* 10.3*  HCT 33.7* 33.5*  PLT 302 294   BMET  Recent Labs  10/14/14 0351 10/15/14 0753  NA 148* 146*  K 3.9 3.6  CL 111 108  CO2 29 29  GLUCOSE 112* 99  BUN 8 6  CREATININE 1.27* 1.39*  CALCIUM 8.5* 8.5*   PT/INR No results for input(s): LABPROT, INR in the last 72 hours. CMP     Component Value Date/Time   NA 146* 10/15/2014 0753   NA 145* 01/17/2013 1703   K 3.6 10/15/2014 0753   CL 108 10/15/2014 0753   CO2 29 10/15/2014 0753   GLUCOSE 99 10/15/2014 0753   GLUCOSE 82 01/17/2013 1703   BUN 6 10/15/2014 0753   BUN 26 01/17/2013 1703   CREATININE 1.39* 10/15/2014 0753   CREATININE 1.69* 11/05/2012 1541   CALCIUM 8.5* 10/15/2014 0753   PROT 7.2 09/29/2014 1107   PROT 6.5 01/17/2013 1703   ALBUMIN 2.2* 10/03/2014 0309   AST 22 09/29/2014 1107   ALT 18 09/29/2014 1107   ALKPHOS 77 09/29/2014 1107   BILITOT 0.3 09/29/2014 1107   GFRNONAA 39* 10/15/2014 0753   GFRNONAA 32*  11/05/2012 1541   GFRAA 45* 10/15/2014 0753   GFRAA 36* 11/05/2012 1541   Lipase     Component Value Date/Time   LIPASE 29 09/29/2014 1108       Studies/Results: Ct Abdomen Pelvis Wo Contrast  10/13/2014   CLINICAL DATA:  64 year old female 10 days post exploratory laparotomy with sigmoid colectomy, colovaginal fistula takedown and colostomy formation. Sigmoid colon stricture noted at the time of surgery. Nausea with vomiting phlegm. Subsequent encounter.  EXAM: CT ABDOMEN AND PELVIS WITHOUT CONTRAST  TECHNIQUE: Multidetector CT imaging of the abdomen and pelvis was performed following the standard protocol without IV contrast.  COMPARISON:  Plain film examination 10/07/2014. Preoperative CT 09/29/2014.  FINDINGS: Post resection portion of the descending colon and sigmoid colon. Rectosigmoid pouch without complication. Left lower abdominal colostomy site without complication.  Subcutaneous edema most notable buttock and upper thigh region greater on the right may represent dependent edema or third spacing of fluid.  Anterior abdominal/pelvic wall incision with gas and mild infiltration of fat planes at the base of the incision site which may represent residua of healing by secondary intent without well-defined drainable abscess.  Contrast and partially gas-filled prominent size loops of small bowel with change of caliber in the left  lower quadrant. This may reflect result of adhesions and mild partial obstruction. The small bowel loops extend towards the base of the incision site but do not appear to enter such.  Basilar subsegmental atelectasis with small left-sided pleural effusion.  Heart size top-normal coronary artery calcifications and aortic root calcifications.  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 Ectasia right common iliac artery. Narrowing of the left common iliac artery.   Gallstones/gallbladder sludge.  Taking into account limitation by non contrast imaging, no worrisome hepatic, splenic, pancreatic or adrenal lesion.  Scarring of the kidneys greater on the left. Fullness of the renal collecting systems without calcified obstructing stone identified. Left renal nonobstructing calculi and/or vascular calcifications unchanged. Left renal sub cm hyperdense lesions possibly hemorrhagic cysts although incompletely assessed.  Partially contracted noncontrast filled urinary bladder without obvious abnormality.  Postsurgical changes L5-S1.  IMPRESSION: Post resection portion of the descending colon and sigmoid colon. Rectosigmoid pouch without complication. Left lower abdominal colostomy site without complication.  Anterior abdominal/pelvic wall incision with gas and mild infiltration of fat planes at the base of the incision site which may represent residua of healing by secondary intent without well-defined drainable abscess.  Contrast and partially gas-filled prominent size loops of small bowel with change of caliber in the left lower quadrant. This may reflect result of adhesions and mild partial obstruction. The small bowel loops extend towards the base of the incision site but do not appear to enter such.  Basilar subsegmental atelectasis with small left-sided pleural effusion.  Heart size top-normal coronary artery calcifications and aortic root calcifications.  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  Gallstones/gallbladder sludge.  Scarring of the kidneys greater on the left. Fullness of the renal collecting systems without calcified obstructing stone identified.  Dependent edema/third spacing of fluid.   Electronically Signed   By: Genia Del M.D.   On: 10/13/2014 15:44   Dg Chest 1 View  10/15/2014   CLINICAL DATA:  Chest rales  EXAM: CHEST  1 VIEW  COMPARISON:   10/05/2014  FINDINGS: Chronic pleural scarring in the right lung base is stable from prior studies. NG tube is been removed  Cardiac enlargement. Negative for heart failure or pneumonia. Left lung is clear.  IMPRESSION: No active disease.   Electronically Signed   By: Franchot Gallo M.D.   On: 10/15/2014 09:02   Dg Abd Portable 1v  10/14/2014   CLINICAL DATA:  Ileus.  EXAM: PORTABLE ABDOMEN - 1 VIEW  COMPARISON:  CT 10/13/2014.  FINDINGS: Persistent dilated loops of small bowel noted. Colonic gas pattern is normal. These findings suggest mild partial small-bowel obstruction versus adynamic ileus. No free air. Mild residual oral contrast noted. Ostomy site left lower quadrant. Aortoiliac atherosclerotic vascular disease. Prior lumbosacral spine fusion. No acute bony abnormality. Surgical clip right lower quadrant.  IMPRESSION: 1. Persistent dilated loops of small bowel with normal colonic gas pattern. These findings suggest persistent mild partial small-bowel obstruction versus adynamic ileus. Ostomy site noted left lower quadrant. 2. Aortoiliac atherosclerotic vascular disease.   Electronically Signed   By: Marcello Moores  Register   On: 10/14/2014 07:37    Anti-infectives: Anti-infectives    Start     Dose/Rate Route Frequency Ordered Stop   10/07/14 1800  cefUROXime (CEFTIN) tablet 500 mg  Status:  Discontinued  500 mg Oral 2 times daily with meals 10/07/14 1203 10/11/14 1611   10/03/14 1530  cefTRIAXone (ROCEPHIN) 1 g in dextrose 5 % 50 mL IVPB - Premix  Status:  Discontinued     1 g 100 mL/hr over 30 Minutes Intravenous Every 24 hours 10/03/14 1454 10/07/14 1203   10/02/14 2200  Levofloxacin (LEVAQUIN) IVPB 250 mg  Status:  Discontinued     250 mg 50 mL/hr over 60 Minutes Intravenous Every 24 hours 10/01/14 2105 10/02/14 2006   10/02/14 2200  Levofloxacin (LEVAQUIN) IVPB 250 mg  Status:  Discontinued     250 mg 50 mL/hr over 60 Minutes Intravenous Every 24 hours 10/02/14 1000 10/03/14 1454    10/02/14 0800  clindamycin (CLEOCIN) IVPB 900 mg     900 mg 100 mL/hr over 30 Minutes Intravenous To [facility] 10/01/14 1238 10/02/14 0933   10/02/14 0800  gentamicin (GARAMYCIN) IVPB 100 mg     100 mg 200 mL/hr over 30 Minutes Intravenous To [facility] 10/01/14 1238 10/02/14 0938   10/01/14 2200  levofloxacin (LEVAQUIN) IVPB 500 mg     500 mg 100 mL/hr over 60 Minutes Intravenous  Once 10/01/14 2059 10/02/14 0104       Assessment/Plan Large bowel obstruction secondary to sigmoid colon stricture and colovaginal fistula  POD #13 exploratory laparotomy with sigmoid colectomy, colovaginal fistula takedown and colostomy--Dr. Brantley Stage -N/V resolved today, appetite improving, colostomy with good stool output -CT abd/pelvis showed normal post-op changes, dilated loops of small bowel, no acute findings or drainable abscesses -KUB this am showed dilated loops of small bowel, but normal colonic gas patterns, most consistent with ileus. -Continue soft diet as tolerated, ensure -Bowel regimen - start colace and miralax -Antiemetics to manage nausea -Cont PT -Stoma is purple, but viable, monitor, stool output -Wound VAC in place (NOT OPEN ABDOMEN VAC), TTS -Ostomy is having some mucocutaneous separation and surface seems to be sloughing off, will need to monitor this closely ID- ceftin for UTI - completed. VTE prophylaxis-SCD/heparin NSTEMI - cardiology following, pending cath today, okay to anticoagulate - they anticipate plavix and aspirin for 1 year, so this may prevent her from getting reversed prior to 1 year from now unless anticoagulation could be held.  Would need cardiac clearance prior anyway. Pulmonary-hypoxia has resolved, CXR ok. Has COPD. Pulling 766ml on IS which is improved.  FEN-soft, on orals and IV prn pain meds Disp-NSTEMI    LOS: 16 days    Nat Christen 10/15/2014, 9:15 AM Pager: [phone]

## 2014-10-15 NOTE — Progress Notes (Signed)
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 Plan was for surgical intervention of fistula versus stricture with cardiac catheterization (after patient had failed Myoview ) done prior to surgery for clearance. Patient unable to get cardiac catheterization due to acute renal failure on day of catheterization. 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  Following surgery, patient with persistent leukocytosis and found to have UTI. Cardiology will be consulted after patient started having chest pain with mildly elevated troponin. On 6/13, patient's hemoglobin had dropped to as low as 7. She did not have an acute drop slowly trending down since admission.  She has also since developed postop ileus.  Patient's creatinine has remained stable. Cardiology plans to take her for cardiac catheterization later today. Patient herself with no complaints, no shortness of breath and abdominal pain well controlled  Assessment/Plan:    Tobacco user: Counseled. On nicotine patch.    COPD (chronic obstructive pulmonary disease): Stable.    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  Following catheterization, stoma to be evaluated. Surgery feels like ostomy not really working. Continue wound VAC.    CAD with mild non-STEMI and ischemia.: For diagnostic cath later today   Acute on chronic diastolic heart failure: Patient up 11 pounds and +7.3 L. With renal function almost to  baseline, can hopefully start diuresis soon    GERD (gastroesophageal reflux disease)  Acute kidney injury in the setting of stage III chronic kidney disease: Back to near baseline. Felt to be secondary to recent CT scan done with contrast plus episodes of emesis plus poor oral intake. Renal ultrasound unrevealing.       E-coli UTI: Completed anti-biotic course  Code Status: Full code  Family Communication: Family members at the bedside  Disposition Plan: Awaiting cardiac catheterization   Consultants:  Cardiology  General surgery  Procedures: Sigmoid colon colectomy with in colostomy takedown of colovaginal fistula per Dr. Brantley Stage 10/02/2014 Cardiac catheterization plan 6/15  Antibiotics:  IV Levaquin 10/01/2014 >>> 10/03/2014  IV Rocephin 10/03/2014>>>>> 10/07/2014  Oral Ceftin 10/07/2014   Objective: BP 162/66 mmHg  Pulse 63  Temp(Src) 98.6 F (37 C) (Oral)  Resp 19  Ht 5\' 1"  (1.549 m)  Wt 69.809 kg (153 lb 14.4 oz)  BMI 29.09 kg/m2  SpO2 96%  Intake/Output Summary (Last 24 hours) at 10/15/14 1651 Last data filed at 10/15/14 1639  Gross per 24 hour  Intake      0 ml  Output    475 ml  Net   -475 ml   Filed Weights   09/30/14 0534 10/01/14 0602 10/12/14 0524  Weight: 66.497 kg (146 lb 9.6 oz) 66.679 kg (147 lb) 69.809 kg (153 lb 14.4 oz)    Exam:   General:  Alert and oriented 3, no acute distress  Cardiovascular: Regular rate and rhythm, D6-Q2, 2/6 systolic ejection murmur  Respiratory: Poor inspiratory effort, decreased at bases  Abdomen: Soft, nontender, ostomy noted, few bowel sounds, wound VAC in place  Musculoskeletal: Trace edema   Data Reviewed: Basic Metabolic Panel:  Recent  Labs Lab 10/11/14 0451 10/12/14 1313 10/13/14 1159 10/14/14 0351 10/15/14 0753  NA 145 145 147* 148* 146*  K 3.9 3.8 3.3* 3.9 3.6  CL 111 111 113* 111 108  CO2 25 27 27 29 29   GLUCOSE 91 110* 113* 112* 99  BUN 11 11 10 8 6   CREATININE 1.18* 1.45*  1.31* 1.27* 1.39*  CALCIUM 8.1* 8.4* 8.2* 8.5* 8.5*  MG 2.1  --   --   --   --    Liver Function Tests: No results for input(s): AST, ALT, ALKPHOS, BILITOT, PROT, ALBUMIN in the last 168 hours. No results for input(s): LIPASE, AMYLASE in the last 168 hours. No results for input(s): AMMONIA in the last 168 hours. CBC:  Recent Labs Lab 10/11/14 0451 10/12/14 1313 10/13/14 0505 10/14/14 0351 10/15/14 0753  WBC 10.9* 15.0* 13.3* 13.9* 11.0*  HGB 8.2* 8.1* 7.8* 10.5* 10.3*  HCT 26.7* 27.6* 26.0* 33.7* 33.5*  MCV 100.8* 101.8* 101.2* 94.7 95.4  PLT 234 276 277 302 294   Cardiac Enzymes:    Recent Labs Lab 10/12/14 2251 10/13/14 0505 10/13/14 1159 10/13/14 2107 10/14/14 0351  TROPONINI 0.04* 0.03 0.03 0.04* 0.05*   BNP (last 3 results) No results for input(s): BNP in the last 8760 hours.  ProBNP (last 3 results) No results for input(s): PROBNP in the last 8760 hours.  CBG: No results for input(s): GLUCAP in the last 168 hours.  No results found for this or any previous visit (from the past 240 hour(s)).   Studies: Dg Chest 1 View  10/15/2014   CLINICAL DATA:  Chest rales  EXAM: CHEST  1 VIEW  COMPARISON:  10/05/2014  FINDINGS: Chronic pleural scarring in the right lung base is stable from prior studies. NG tube is been removed  Cardiac enlargement. Negative for heart failure or pneumonia. Left lung is clear.  IMPRESSION: No active disease.   Electronically Signed   By: Franchot Gallo M.D.   On: 10/15/2014 09:02    Scheduled Meds: . amLODipine  10 mg Oral Daily  . aspirin  325 mg Oral Daily  . budesonide-formoterol  2 puff Inhalation BID  . docusate sodium  100 mg Oral BID  . feeding supplement (ENSURE ENLIVE)  237 mL Oral BID BM  . isosorbide mononitrate  30 mg Oral Daily  . lip balm  1 application Topical BID  . metoprolol tartrate  12.5 mg Oral BID  . nicotine  7 mg Transdermal Daily  . pantoprazole  40 mg Oral Daily  . pneumococcal 23 valent vaccine  0.5 mL  Intramuscular Tomorrow-1000  . polyethylene glycol  17 g Oral Daily  . saccharomyces boulardii  250 mg Oral BID  . sucralfate  1 g Oral TID WC & HS  . tiotropium  18 mcg Inhalation Daily    Continuous Infusions: . heparin 1,500 Units/hr (10/15/14 1524)     Time spent: 15 minutes  [facility] Pager [phone]. If 7PM-7AM, please contact night-coverage at [email or web address], password [place] 10/15/2014, 4:51 PM  LOS: 16 days

## 2014-10-15 NOTE — Consult Note (Addendum)
WOC ostomy follow up [facility] following for assessment and plan of care to abd wound; bedside nurses are changing Vac dressing Q T/TH/Sat Stoma type/location: Colostomy pouch to LLQ from 6/2 Stomal assessment/size: Stoma 95% loose slough, 5% red, flush with skin level, 1 1/2 inches.  Mucocutaneous separation beginning to occur at edges.  Tunneling to 4 cm when swab inserted to middle of stoma, not where os is located. Pt c/o pain to stoma. CCS PA at bedside at assess site. Output Small dark brown liquid, no stool. Ostomy pouching: 1pc with barrier ring to maintain seal.  Education provided: Demonstrated pouch change using one piece with barrier ring to maintain seal. Pt watched but did not participate or ask questions. She was able to open and close the velcro opening with large amt assistance.  Enrolled patient in [place] Start Discharge program: Yes Educational materials and extra supplies left at bedside for staff nurse use. Recommend SNF or home health for further assistance after discharge since pt is weak and has not been able to perform pouching activities without assistance. Julien Girt MSN, RN, [place], [place], [place]

## 2014-10-15 NOTE — Progress Notes (Signed)
Patient Name: Alexa Key Date of Encounter: 10/15/2014  Primary Cardiologist: J. 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   CURRENT MEDS . amLODipine  10 mg Oral Daily  . aspirin  325 mg Oral Daily  . budesonide-formoterol  2 puff Inhalation BID  . docusate sodium  100 mg Oral BID  . feeding supplement (ENSURE ENLIVE)  237 mL Oral BID BM  . isosorbide mononitrate  30 mg Oral Daily  . lip balm  1 application Topical BID  . metoprolol tartrate  12.5 mg Oral BID  . nicotine  7 mg Transdermal Daily  . pantoprazole  40 mg Oral Daily  . pneumococcal 23 valent vaccine  0.5 mL Intramuscular Tomorrow-1000  . polyethylene glycol  17 g Oral Daily  . saccharomyces boulardii  250 mg Oral BID  . sucralfate  1 g Oral TID WC & HS  . tiotropium  18 mcg Inhalation Daily    OBJECTIVE  Filed Vitals:   10/14/14 1400 10/14/14 2112 10/15/14 0531 10/15/14 0857  BP: 170/62 140/57 139/49 148/55  Pulse: 72 75 63 67  Temp: 98.5 F (36.9 C) 98.7 F (37.1 C) 98.8 F (37.1 C)   TempSrc: Oral Oral Oral   Resp: 20 20 18    Height:      Weight:      SpO2: 94% 97% 96%     Intake/Output Summary (Last 24 hours) at 10/15/14 0934 Last data filed at 10/15/14 0535  Gross per 24 hour  Intake      0 ml  Output    375 ml  Net   -375 ml   Filed Weights   09/30/14 0534 10/01/14  0602 10/12/14 0524  Weight: 146 lb 9.6 oz (66.497 kg) 147 lb (66.679 kg) 153 lb 14.4 oz (69.809 kg)    PHYSICAL EXAM  General: Pleasant, NAD. Neuro: Alert and oriented X 3. Moves all extremities spontaneously. Psych: Normal affect. HEENT:  Normal  Neck: Supple without bruits or JVD. Lungs:  Resp regular and unlabored. CTA Heart: RRR no s3, s4, or murmurs. Abdomen: Skin drain in place, colostomy bad in place Extremities: No clubbing, cyanosis or edema. DP/PT/Radials 2+ and equal bilaterally.  Accessory Clinical Findings  CBC  Recent Labs  10/14/14 0351 10/15/14 0753  WBC 13.9* 11.0*  HGB 10.5* 10.3*  HCT 33.7* 33.5*  MCV 94.7 95.4  PLT 302 811   Basic Metabolic Panel  Recent Labs  10/14/14 0351 10/15/14 0753  NA 148* 146*  K 3.9 3.6  CL 111 108  CO2 29 29  GLUCOSE 112* 99  BUN 8 6  CREATININE 1.27* 1.39*  CALCIUM 8.5* 8.5*   Cardiac Enzymes  Recent Labs  10/13/14 1159 10/13/14 2107 10/14/14 0351  TROPONINI 0.03 0.04* 0.05*   TELE NSR with episode of 11 beats run of NSVT and occasional PACs  ECG  No new EKG  Echocardiogram 08/07/2012  LV EF: 50% -  55%  ------------------------------------------------------------ History:  PMH: NSTEMI. Coronary artery disease. Risk factors: History of CVA. Current tobacco use. Hypertension. Diabetes mellitus. Dyslipidemia.  ------------------------------------------------------------ Study Conclusions  - Left ventricle: The cavity size was normal. Wall thickness was increased in a pattern of mild LVH. Systolic function was normal. The estimated ejection fraction was in the range of 50% to 55%. Wall motion was normal; there were no regional wall motion abnormalities. Features are consistent with a pseudonormal left ventricular filling pattern, with concomitant abnormal relaxation and increased filling pressure (grade 2 diastolic dysfunction). Doppler parameters are consistent with  high ventricular filling pressure. - Mitral valve: Calcified annulus. - Left atrium: The atrium was moderately dilated. - Pulmonary arteries: Systolic pressure was mildly increased. PA peak pressure: 26mm Hg (S).    Radiology/Studies  Ct Abdomen Pelvis Wo Contrast  10/13/2014   CLINICAL DATA:  64 year old female 10 days post exploratory laparotomy with sigmoid colectomy, colovaginal fistula takedown and colostomy formation. Sigmoid colon stricture noted at the time of surgery. Nausea with vomiting phlegm. Subsequent encounter.  EXAM: CT ABDOMEN AND PELVIS WITHOUT CONTRAST  TECHNIQUE: Multidetector CT imaging of the abdomen and pelvis was performed following the standard protocol without IV contrast.  COMPARISON:  Plain film examination 10/07/2014. Preoperative CT 09/29/2014.  FINDINGS: Post resection portion of the descending colon and sigmoid colon. Rectosigmoid pouch without complication. Left lower abdominal colostomy site without complication.  Subcutaneous edema most notable buttock and upper thigh region greater on the right may represent dependent edema or third spacing of fluid.  Anterior abdominal/pelvic wall incision with gas and mild infiltration of fat planes at the base of the incision site which may represent residua of healing by secondary intent without well-defined drainable abscess.  Contrast and partially gas-filled prominent size loops of small bowel with change of caliber in the left lower quadrant. This may reflect result of adhesions and mild partial obstruction. The small bowel loops extend towards the base of the incision site but do not appear to enter such.  Basilar subsegmental atelectasis with small left-sided pleural effusion.  Heart size top-normal coronary artery calcifications and aortic root calcifications.  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 Ectasia right common iliac  artery. Narrowing of the left common iliac artery.  Gallstones/gallbladder sludge.  Taking into account limitation by non contrast imaging, no worrisome hepatic, splenic, pancreatic or adrenal lesion.  Scarring of the kidneys greater on the left. Fullness of the renal collecting systems without calcified obstructing stone identified. Left renal nonobstructing calculi and/or vascular calcifications unchanged. Left renal sub cm hyperdense lesions possibly hemorrhagic cysts although incompletely assessed.  Partially contracted noncontrast filled urinary bladder without obvious abnormality.  Postsurgical changes L5-S1.  IMPRESSION: Post resection portion of the descending colon and sigmoid colon. Rectosigmoid pouch without complication. Left lower abdominal colostomy site without complication.  Anterior abdominal/pelvic wall incision with gas and mild infiltration of fat planes at the base of the incision site which may represent residua of healing by secondary intent without well-defined drainable abscess.  Contrast and partially gas-filled prominent size loops of small bowel with change of caliber in the left lower quadrant. This may reflect result of adhesions and mild partial obstruction. The small bowel loops extend towards the base of the incision site but do not appear to enter such.  Basilar subsegmental atelectasis with small left-sided pleural  effusion.  Heart size top-normal coronary artery calcifications and aortic root calcifications.  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  Gallstones/gallbladder sludge.  Scarring of the kidneys greater on the left. Fullness of the renal collecting systems without calcified obstructing stone identified.  Dependent edema/third spacing of fluid.   Electronically Signed   By: Genia Del M.D.   On: 10/13/2014 15:44   Dg Chest 1 View  10/15/2014   CLINICAL DATA:   Chest rales  EXAM: CHEST  1 VIEW  COMPARISON:  10/05/2014  FINDINGS: Chronic pleural scarring in the right lung base is stable from prior studies. NG tube is been removed  Cardiac enlargement. Negative for heart failure or pneumonia. Left lung is clear.  IMPRESSION: No active disease.   Electronically Signed   By: Franchot Gallo M.D.   On: 10/15/2014 09:02   Dg Abd 1 View  10/07/2014   CLINICAL DATA:  64 year old with vomiting. Recent abdominal surgery.  EXAM: ABDOMEN - 1 VIEW  COMPARISON:  Prior CT scan of the abdomen and pelvis 09/29/2014  FINDINGS: Multiple dilated air-filled loops of small bowel noted throughout abdomen. Left lower quadrant noted. No evidence of large free air on this supine. Small right pleural. Surgical changes prior interbody cage placement -S1.  IMPRESSION: Dilated and air-filled loops of small bowel in the mid abdomen concerning for small bowel obstruction.  Small right pleural effusion.   Electronically Signed   By: Jacqulynn Cadet M.D.   On: 10/07/2014 21:47   Ct Abdomen Pelvis W Contrast  09/29/2014   CLINICAL DATA:  Lower abdominal pain  EXAM: CT ABDOMEN AND PELVIS WITH CONTRAST  TECHNIQUE: Multidetector CT imaging of the abdomen and pelvis was performed using the standard protocol following bolus administration of intravenous contrast.  CONTRAST:  62mL OMNIPAQUE IOHEXOL 300 MG/ML SOLN, 32mL OMNIPAQUE IOHEXOL 300 MG/ML SOLN  COMPARISON:  None.  FINDINGS: Lung bases are free of acute infiltrate or sizable effusion.  The liver, gallbladder, spleen, adrenal glands and pancreas are within normal limits. The kidneys are well visualized and demonstrate right renal cystic change stable from the prior exam. Stable renal calculi are noted on the left. 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 It is stable in appearance from the prior exam. Stable scattered atherosclerotic changes are noted throughout the aortoiliac system.  The  colon is somewhat distended with air with area of wall thickening and narrowing in the sigmoid colon. This has progressed in the interval from the prior exam. Given findings from recent sigmoidoscopy this likely represents progressive smooth muscle thickening. Clinical correlation is recommended. The bladder is decompressed. No pelvic mass lesion is noted. The bony structures demonstrate postoperative change in the lumbar spine.  IMPRESSION: Stable cystic and calculus changes in the kidneys bilaterally.  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  Increased colonic distention with air secondary to an area of narrowing in the sigmoid colon. This likely represents progressive smooth muscle thickening and narrowing. The need for direct visualization can be determined on a clinical basis.   Electronically Signed   By: Inez Catalina M.D.   On: 09/29/2014 14:07   US Renal  10/01/2014   CLINICAL DATA:  Acute renal failure.  EXAM: RENAL / URINARY TRACT ULTRASOUND COMPLETE  COMPARISON:  CT 09/29/2014  FINDINGS: Right Kidney:  Length: 10.2 cm.  Small 9 mm cyst in the upper pole. Normal echotexture. Normal echotexture no hydronephrosis.  Left Kidney:  Length: 10.1 cm. Normal echotexture. No hydronephrosis. 12 mm cyst in the upper pole. 19 mm hypoechoic parapelvic area in the lower pole, likely small parapelvic cysts. This was seen scratch head this appears to be a cyst on prior CT. Mild cortical thinning within the mid and upper pole.  Bladder:  Appears normal for degree of bladder distention.  IMPRESSION: Small bilateral renal cysts. No hydronephrosis. No acute findings. Scarring in the mid and upper pole of the left kidney.   Electronically Signed   By: Rolm Baptise M.D.   On: 10/01/2014 14:53   Dg Chest Port 1 View  10/05/2014   CLINICAL DATA:  Hypoxia  EXAM: PORTABLE CHEST - 1 VIEW  COMPARISON:  Radiograph 10/03/2014  FINDINGS: NG tube extends the stomach. Stable mildly enlarged  cardiac silhouette. There is interval improvement in basilar atelectasis seen on prior. Mild peripheral linear pattern remains.  IMPRESSION: 1. Improvement in right basilar atelectasis. 2. Mild interstitial edema pattern.   Electronically Signed   By: Suzy Bouchard M.D.   On: 10/05/2014 09:41   Dg Chest Port 1 View  10/03/2014   CLINICAL DATA:  64 year old female with a history of leukocytosis.  EXAM: PORTABLE CHEST - 1 VIEW  COMPARISON:  Chest x-ray 12/20/2012, 08/09/2012. Prior abdominal CT 09/29/2014 out prior chest CT 05/19/2005  FINDINGS: Cardiac diameter unchanged. Right rotation of the patient accentuates the right heart border, with increased opacity along the right-sided mediastinum. Surgical changes in the right hilum.  No pneumothorax.  Elevation of the right hemidiaphragm. Right base not well visualized.  Interstitial opacities of the bilateral lungs.  Gastric tube projects over the mediastinum, terminating out of the field of view.  IMPRESSION: Limited chest x-ray demonstrates bilateral interstitial opacities and right basilar airspace disease, potentially multifocal infection including atypical infections as well as aspiration pneumonitis/pneumonia.  Surgical changes of the right hilar region, status post lobectomy. If the patient has a history of lung carcinoma, further evaluation with chest CT may be useful, as the lung changes are new from the most recent chest x-ray of 12/20/2012.  Gastric tube projects over the mediastinum.  Signed,  Dulcy Fanny. Earleen Newport, DO  [facility]  [facility]   Electronically Signed   By: Corrie Mckusick D.O.   On: 10/03/2014 09:12   Dg Abd Portable 1v  10/14/2014   CLINICAL DATA:  Ileus.  EXAM: PORTABLE ABDOMEN - 1 VIEW  COMPARISON:  CT 10/13/2014.  FINDINGS: Persistent dilated loops of small bowel noted. Colonic gas pattern is normal. These findings suggest mild partial small-bowel obstruction versus adynamic ileus. No free  air. Mild residual oral contrast noted. Ostomy site left lower quadrant. Aortoiliac atherosclerotic vascular disease. Prior lumbosacral spine fusion. No acute bony abnormality. Surgical clip right lower quadrant.  IMPRESSION: 1. Persistent dilated loops of small bowel with normal colonic gas pattern. These findings suggest persistent mild partial small-bowel obstruction versus adynamic ileus. Ostomy site noted left lower quadrant. 2. Aortoiliac atherosclerotic vascular disease.   Electronically Signed   By: Marcello Moores  Register   On: 10/14/2014 07:37    ASSESSMENT AND PLAN  64 year old female with PMH significant for tobacco abuse, COPD HTN, CKD stage III, and rectovaginal fistula presenting with acute abdominal pain and nausea. Felt to be partial colonic obstruction 2/2 benign stricture of colovaginal fistula. Originally planned for diagnostic cath prior to surgery (with delayed PCI  later as pt did not have any CP), however renal function worsened. Cath cancelled 6/1. S/p sigmoid colon colectomy with in colostomy takedown of colovaginal fistula 6/2. Postop ileus noted.   1. CAD with abnormal stress test 08/2014  - moderate sized reversible anterior, apical and apical lateral perfusion defect. Intermediate risk study. EF 52%  - had mild NSTEMI over the weekend (borderline high trop) and some burning sensation in the chest pain, trop trending down, symptom improving.   - discussed with MD and surgery PA, noted that she is cleared by surgery to take ASA and plavix for 1 yr is necessary, it is not open abdomen per surgery, just skin vac and well healed too. No other contraindication to stent. hgb stable. Will proceed with cath +/- PCI today  2. Large bowel obstruction 2/2 sigmoid colon stricture and colovaginal fistula s/p ex lap with sigmoid colectomy  3. Acute on chronic diastolic HF  - continue have some bibasilar rale, unclear if atelectasis vs HF, Cr stable after 1st dose of IV lasix, will give  additional lasix tonight and check CXR in AM  4. Acute on chronic renal insufficiency   5. HTN: uncontrolled, added Imdur  6. HLD  7. COPD  8. Anemia: transfused recently   Signed, Almyra Deforest PA-C Pager: [phone]  Agree with note by Almyra Deforest PA-C  Pt s/p abd surgery with intermediate risk myoview done preoperatively, CP recently with inferolateral TWI and mildly + enz. Needs diagnostic cath. H/O previous PCI. Can be done radially. Apparently has more bleeding stoma and needs to have this revised tomorrow. Given above data, I favor cor angio to risk stratify prior to taking back to OR. I understand that DAPT/anti coag may be an issue if high risk anatomy. Discussed with patient.   Lorretta Harp, M.D., [place], [facility], Laverta Baltimore Smith [address]. [place], [place]  [address]  [phone] 10/15/2014 3:03 PM

## 2014-10-15 NOTE — Progress Notes (Signed)
ANTICOAGULATION CONSULT NOTE Pharmacy Consult:  Heparin Indication: chest pain/ACS  Allergies  Allergen Reactions  . Penicillins Rash  . Sulfa Antibiotics Rash    Patient Measurements: Height: 5\' 1"  (154.9 cm) Weight: 153 lb 14.4 oz (69.809 kg) IBW/kg (Calculated) : 47.8 Heparin Dosing Weight: 63 kg  Vital Signs: Temp: 98.7 F (37.1 C) (06/14 2112) Temp Source: Oral (06/14 2112) BP: 140/57 mmHg (06/14 2112) Pulse Rate: 75 (06/14 2112)  Labs:  Recent Labs  10/12/14 1313  10/13/14 0505 10/13/14 1159  10/13/14 2107 10/14/14 0351 10/14/14 1424 10/14/14 2302  HGB 8.1*  --  7.8*  --   --   --  10.5*  --   --   HCT 27.6*  --  26.0*  --   --   --  33.7*  --   --   PLT 276  --  277  --   --   --  302  --   --   HEPARINUNFRC  --   --  0.20*  --   < >  --  <0.10* 0.30 0.22*  CREATININE 1.45*  --   --  1.31*  --   --  1.27*  --   --   TROPONINI 0.03  < > 0.03 0.03  --  0.04* 0.05*  --   --   < > = values in this interval not displayed.  Estimated Creatinine Clearance: 38.9 mL/min (by C-G formula based on Cr of 1.27).  Assessment: 64 yo female with NSTEMI for heparin  Goal of Therapy:  Heparin level 0.3-0.7 units/ml Monitor platelets by anticoagulation protocol: Yes   Plan:  Increase Heparin 1350 units/hr Check heparin level in 8 hours.  Phillis Knack, PharmD, BCPS  10/15/2014, 12:24 AM

## 2014-10-16 ENCOUNTER — Inpatient Hospital Stay (HOSPITAL_COMMUNITY): Payer: Commercial Managed Care - HMO

## 2014-10-16 ENCOUNTER — Ambulatory Visit: Payer: Commercial Managed Care - HMO | Admitting: Cardiology

## 2014-10-16 ENCOUNTER — Encounter (HOSPITAL_COMMUNITY): Payer: Self-pay | Admitting: Cardiovascular Disease

## 2014-10-16 DIAGNOSIS — I5033 Acute on chronic diastolic (congestive) heart failure: Secondary | ICD-10-CM

## 2014-10-16 LAB — CBC
HCT: 32.9 % — ABNORMAL LOW (ref 36.0–46.0)
Hemoglobin: 10 g/dL — ABNORMAL LOW (ref 12.0–15.0)
MCH: 29.2 pg (ref 26.0–34.0)
MCHC: 30.4 g/dL (ref 30.0–36.0)
MCV: 96.2 fL (ref 78.0–100.0)
Platelets: 264 10*3/uL (ref 150–400)
RBC: 3.42 MIL/uL — ABNORMAL LOW (ref 3.87–5.11)
RDW: 17.2 % — AB (ref 11.5–15.5)
WBC: 9.5 10*3/uL (ref 4.0–10.5)

## 2014-10-16 LAB — BASIC METABOLIC PANEL
Anion gap: 10 (ref 5–15)
BUN: 7 mg/dL (ref 6–20)
CHLORIDE: 104 mmol/L (ref 101–111)
CO2: 30 mmol/L (ref 22–32)
CREATININE: 1.22 mg/dL — AB (ref 0.44–1.00)
Calcium: 8.2 mg/dL — ABNORMAL LOW (ref 8.9–10.3)
GFR calc Af Amer: 52 mL/min — ABNORMAL LOW (ref >60)
GFR calc non Af Amer: 45 mL/min — ABNORMAL LOW (ref >60)
Glucose, Bld: 113 mg/dL — ABNORMAL HIGH (ref 65–99)
Potassium: 3.8 mmol/L (ref 3.5–5.1)
Sodium: 144 mmol/L (ref 135–145)

## 2014-10-16 LAB — MAGNESIUM: MAGNESIUM: 1.8 mg/dL (ref 1.7–2.4)

## 2014-10-16 MED ORDER — FUROSEMIDE 10 MG/ML IJ SOLN
20.0000 mg | Freq: Every day | INTRAMUSCULAR | Status: DC
  Administered 2014-10-16 – 2014-10-17 (×2): 20 mg via INTRAVENOUS
  Filled 2014-10-16 (×2): qty 2

## 2014-10-16 MED ORDER — IOHEXOL 300 MG/ML  SOLN
25.0000 mL | INTRAMUSCULAR | Status: AC
  Administered 2014-10-16: 25 mL via ORAL

## 2014-10-16 MED FILL — Nitroglycerin IV Soln 100 MCG/ML in D5W: INTRA_ARTERIAL | Qty: 10 | Status: AC

## 2014-10-16 MED FILL — Lidocaine HCl Local Preservative Free (PF) Inj 1%: INTRAMUSCULAR | Qty: 30 | Status: AC

## 2014-10-16 MED FILL — Heparin Sodium (Porcine) 2 Unit/ML in Sodium Chloride 0.9%: INTRAMUSCULAR | Qty: 1500 | Status: AC

## 2014-10-16 NOTE — Progress Notes (Signed)
Pt had 10 beats V-tach, pt denied Chest pain, no SOB, no distress, Vital signs stable as charted, Dr. Claiborne Billings informed, new orders received.  Will continue to monitor patient.

## 2014-10-16 NOTE — Progress Notes (Signed)
[facility] Progress Note  1 Day Post-Op  Subjective: Pt has worsening pain today, continues to have nausea, dry-heaving and pain at ostomy and midline wound.  Her wound vac canister has had an acute change from sanguinous to large amounts of dirty dish-watery output.  Her ostomy had some output today compared to yesterday.  She is scared about possibly going back to surgery.  Objective: Vital signs in last 24 hours: Temp:  [98.2 F (36.8 C)-98.6 F (37 C)] 98.2 F (36.8 C) (06/16 0350) Pulse Rate:  [0-91] 63 (06/16 0350) Resp:  [0-51] 20 (06/16 0350) BP: (81-179)/(41-84) 115/68 mmHg (06/16 0350) SpO2:  [0 %-98 %] 98 % (06/16 0350) Weight:  [67.3 kg (148 lb 5.9 oz)] 67.3 kg (148 lb 5.9 oz) (06/16 0123) Last BM Date: 10/15/14  Intake/Output from previous day: 06/15 0701 - 06/16 0700 In: -  Out: 550 [Urine:550] Intake/Output this shift:    PE: Gen: Alert, NAD, pleasant Abd: Soft, ND, more significantly tender today at wound and around ostomy, +BS, no HSM, midline wound probed with finger and found to have fascial dehiscence with purulent sanguinous drainage output, Ostomy is having significant mucocutaneous separation with undermining and surface seems to be sloughing off nearly 100 yellow slough today, some stool output found in ostomy bag.  VAC canister with tan/brown dish water drainage which filled the canister 3/4 of the way full.     10/16/14 wound vac and ostomy change    Lab Results:   Recent Labs  10/15/14 0753 10/16/14 0530  WBC 11.0* 9.5  HGB 10.3* 10.0*  HCT 33.5* 32.9*  PLT 294 264   BMET  Recent Labs  10/15/14 0753 10/16/14 0530  NA 146* 144  K 3.6 3.8  CL 108 104  CO2 29 30  GLUCOSE 99 113*  BUN 6 7  CREATININE 1.39* 1.22*  CALCIUM 8.5* 8.2*   PT/INR No results for input(s): LABPROT, INR in the last 72 hours. CMP     Component Value Date/Time   NA 144 10/16/2014 0530   NA 145* 01/17/2013 1703   K 3.8 10/16/2014 0530   CL  104 10/16/2014 0530   CO2 30 10/16/2014 0530   GLUCOSE 113* 10/16/2014 0530   GLUCOSE 82 01/17/2013 1703   BUN 7 10/16/2014 0530   BUN 26 01/17/2013 1703   CREATININE 1.22* 10/16/2014 0530   CREATININE 1.69* 11/05/2012 1541   CALCIUM 8.2* 10/16/2014 0530   PROT 7.2 09/29/2014 1107   PROT 6.5 01/17/2013 1703   ALBUMIN 2.2* 10/03/2014 0309   AST 22 09/29/2014 1107   ALT 18 09/29/2014 1107   ALKPHOS 77 09/29/2014 1107   BILITOT 0.3 09/29/2014 1107   GFRNONAA 45* 10/16/2014 0530   GFRNONAA 32* 11/05/2012 1541   GFRAA 52* 10/16/2014 0530   GFRAA 36* 11/05/2012 1541   Lipase     Component Value Date/Time   LIPASE 29 09/29/2014 1108       Studies/Results: Dg Chest 1 View  10/15/2014   CLINICAL DATA:  Chest rales  EXAM: CHEST  1 VIEW  COMPARISON:  10/05/2014  FINDINGS: Chronic pleural scarring in the right lung base is stable from prior studies. NG tube is been removed  Cardiac enlargement. Negative for heart failure or pneumonia. Left lung is clear.  IMPRESSION: No active disease.   Electronically Signed   By: Franchot Gallo M.D.   On: 10/15/2014 09:02    Anti-infectives: Anti-infectives    Start     Dose/Rate Route Frequency  Ordered Stop   10/07/14 1800  cefUROXime (CEFTIN) tablet 500 mg  Status:  Discontinued     500 mg Oral 2 times daily with meals 10/07/14 1203 10/11/14 1611   10/03/14 1530  cefTRIAXone (ROCEPHIN) 1 g in dextrose 5 % 50 mL IVPB - Premix  Status:  Discontinued     1 g 100 mL/hr over 30 Minutes Intravenous Every 24 hours 10/03/14 1454 10/07/14 1203   10/02/14 2200  Levofloxacin (LEVAQUIN) IVPB 250 mg  Status:  Discontinued     250 mg 50 mL/hr over 60 Minutes Intravenous Every 24 hours 10/01/14 2105 10/02/14 2006   10/02/14 2200  Levofloxacin (LEVAQUIN) IVPB 250 mg  Status:  Discontinued     250 mg 50 mL/hr over 60 Minutes Intravenous Every 24 hours 10/02/14 1000 10/03/14 1454   10/02/14 0800  clindamycin (CLEOCIN) IVPB 900 mg     900 mg 100 mL/hr over 30  Minutes Intravenous To [facility] 10/01/14 1238 10/02/14 0933   10/02/14 0800  gentamicin (GARAMYCIN) IVPB 100 mg     100 mg 200 mL/hr over 30 Minutes Intravenous To ShortStay Surgical 10/01/14 1238 10/02/14 0938   10/01/14 2200  levofloxacin (LEVAQUIN) IVPB 500 mg     500 mg 100 mL/hr over 60 Minutes Intravenous  Once 10/01/14 2059 10/02/14 0104       Assessment/Plan Large bowel obstruction secondary to sigmoid colon stricture and colovaginal fistula  POD #14 exploratory laparotomy with sigmoid colectomy, colovaginal fistula takedown and colostomy--Dr. Brantley Stage -10/13/14 - CT abd/pelvis showed normal post-op changes, dilated loops of small bowel, no acute findings or drainable abscesses -She has continued to have an ileus -Repeat STAT CT scan today -An acute change in the wound vac drainage today, Wound VAC changed today and the wound has dehisced and has purulent-sanguinous drainage.  Ostomy is having significant mucocutaneous separation and surface seems to be sloughing off.  Dr. Donne Hazel to discuss with daughter and patient her options later today once we have the results of the CT scan.  She may require to go back to the OR emergently. -NPO -Antiemetics to manage nausea -Cont PT -?resuming antibiotics ID- ceftin for UTI - completed. VTE prophylaxis-SCD/heparin NSTEMI - cardiology following, cath recommended medical management only Pulmonary-hypoxia has resolved, but still requiring O2 FEN-soft, on orals and IV prn pain meds Disp-NSTEMI, ?return to OR pending CT scan    LOS: 73 days    Nat Christen 10/16/2014, 10:01 AM Pager: [phone]

## 2014-10-16 NOTE — Consult Note (Addendum)
WOC ostomy follow up CCS following for assessment and plan of care to abd wound; PA and Dr Donne Hazel at the bedside to assess the wound and stoma. Stoma type/location: Colostomy to LLQ from 6/2 Stomal assessment/size: Stoma 95% loose slough, 5% red, flush with skin level, 1 1/2 inches. Mucocutaneous separation beginning to occur at edges. Tunneling to 4 cm when swab inserted to middle of stoma, not where os is located. Pt c/o pain to stoma.  Output 50cc semi-formed brown stool. Ostomy pouching: 1pc with barrier ring to maintain seal.  Education provided: Demonstrated pouch change using one piece with barrier ring to maintain seal. Pt is in a large amt pain when pouch change performed.  Enrolled patient in [place] Start Discharge program: Yes Educational materials and extra supplies left at bedside for staff nurse use. Recommend SNF or home health for further assistance after discharge since pt is weak and has not been able to perform pouching activities without assistance.  WOC wound consult note Reason for Consult: Bedside nurses have been changing abd Vac dressing Q Tues/Thurs/Sat.  Removed Vac for surgeon to assess site. Wound type: Midline abd wound beefy red Drainage (amount, consistency, odor) Mod amt thick brown drainage in cannister and draining from pinhole opening in middle of wound bed. Periwound: Intact skin surrounding. Dressing procedure/placement/frequency: CCS plans to obtain another CT scan and take back to surgery for abd wound and ostomy revision.  Applied one piece black foam to 13mm cont suction and cannister changed.  Pt was in a large amt pain when wound was assessed and Vac dressing applied despite pain meds given earlier. Julien Girt MSN, RN, [place], [place], [place]      Revision History

## 2014-10-16 NOTE — Progress Notes (Signed)
Patient ID: Alexa Key, female   DOB: 01-Mar-1951, 64 y.o.   MRN: [id number] I reviewed ct . I think there is extraadominal connection between area of infection near colostomy.  She looks well.  Her wbc is normal and she is afebrile. He stoma is actually functional. I dont know that I have better solution for this in or and discussed observation vs or.  We have decided to continue to follow for now although she understands that either in short term or long term (for stricture) that we could need to reoperate on her.

## 2014-10-16 NOTE — Progress Notes (Signed)
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 Plan was for surgical intervention of fistula versus stricture with cardiac catheterization (after patient had failed Myoview ) done prior to surgery for clearance. Patient unable to get cardiac catheterization due to acute renal failure on day of catheterization. 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  Following surgery, patient with persistent leukocytosis and found to have UTI. Cardiology will be consulted after patient started having chest pain with mildly elevated troponin. On 6/13, patient's hemoglobin had dropped to as low as 7. She did not have an acute drop slowly trending down since admission.  She has also since developed postop ileus.  Patient's creatinine has remained stable. Patient underwent cardiac catheterization on 6/15 noting three-vessel disease although moderate. Only a very distal vessel noted up to 70% blockage. Plan is to manage this medically and if patient needs surgery, felt to be an acceptable cardiovascular risk.  Today, surgery reviewing updated abdominal CT to see if ostomy continues patency.    Patient herself with no complaints  Assessment/Plan:    Tobacco user: Counseled. On nicotine patch.    COPD (chronic obstructive pulmonary disease): Stable.    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  Following catheterization, stoma to be evaluated. After reviewing new CT, surgery wants  to wait and observe to see if fistula will open up. Continue wound VAC.    CAD with mild non-STEMI and ischemia.: For diagnostic cath later today   Acute on chronic diastolic heart failure: Patient up 6 pounds a normal spine liters With renal function almost to baseline, we'll start gentle IV Lasix    GERD (gastroesophageal reflux disease)  Acute kidney injury in the setting of stage III chronic kidney disease: Back to near baseline. Felt to be secondary to recent CT scan done with contrast plus episodes of emesis plus poor oral intake. Renal ultrasound unrevealing.       E-coli UTI: Completed anti-biotic course  Code Status: Full code  Family Communication:  Daughter at the bedside  Disposition Plan: Observing   Consultants:  Cardiology  General surgery  Procedures: Sigmoid colon colectomy with in colostomy takedown of colovaginal fistula per Dr. Brantley Stage 10/02/2014 Cardiac catheterization 6/15: Three-vessel moderate disease  Antibiotics:  IV Levaquin 10/01/2014 >>> 10/03/2014  IV Rocephin 10/03/2014>>>>> 10/07/2014  Oral Ceftin 10/07/2014 x 1 day   Objective: BP 155/58 mmHg  Pulse 61  Temp(Src) 99.3 F (37.4 C) (Oral)  Resp 20  Ht 5\' 1"  (1.549 m)  Wt 67.3 kg (148 lb 5.9 oz)  BMI 28.05 kg/m2  SpO2 98%  Intake/Output Summary (Last 24 hours) at 10/16/14 1729 Last data filed at 10/16/14 1648  Gross per 24 hour  Intake      0 ml  Output   1950 ml  Net  -1950 ml   Filed Weights   10/12/14 0524 10/16/14 0036 10/16/14 0123  Weight: 69.809 kg (153 lb 14.4 oz) 67.3 kg (148 lb 5.9 oz) 67.3 kg (148 lb 5.9 oz)    Exam: Unchanged from previous  General:  Alert and oriented 3, no acute distress  Cardiovascular: Regular rate and rhythm, J2-E2, 2/6 systolic ejection murmur  Respiratory: Poor inspiratory effort, decreased at bases  Abdomen: Soft, nontender, ostomy noted, few bowel sounds, wound VAC in place  Musculoskeletal: Trace edema   Data  Reviewed: Basic Metabolic Panel:  Recent Labs Lab 10/11/14 0451 10/12/14 1313 10/13/14 1159 10/14/14 0351 10/15/14 0753 10/16/14 0530  NA 145 145 147* 148* 146* 144  K 3.9 3.8 3.3* 3.9 3.6 3.8  CL 111 111 113* 111 108 104  CO2 25 27 27 29 29 30   GLUCOSE 91 110* 113* 112* 99 113*  BUN 11 11 10 8 6 7   CREATININE 1.18* 1.45* 1.31* 1.27* 1.39* 1.22*  CALCIUM 8.1* 8.4* 8.2* 8.5* 8.5* 8.2*  MG 2.1  --   --   --   --  1.8   Liver Function Tests: No results for input(s): AST, ALT, ALKPHOS, BILITOT, PROT, ALBUMIN in the last 168 hours. No results for input(s): LIPASE, AMYLASE in the last 168 hours. No results for input(s): AMMONIA in the last 168 hours. CBC:  Recent Labs Lab 10/12/14 1313 10/13/14 0505 10/14/14 0351 10/15/14 0753 10/16/14 0530  WBC 15.0* 13.3* 13.9* 11.0* 9.5  HGB 8.1* 7.8* 10.5* 10.3* 10.0*  HCT 27.6* 26.0* 33.7* 33.5* 32.9*  MCV 101.8* 101.2* 94.7 95.4 96.2  PLT 276 277 302 294 264   Cardiac Enzymes:    Recent Labs Lab 10/12/14 2251 10/13/14 0505 10/13/14 1159 10/13/14 2107 10/14/14 0351  TROPONINI 0.04* 0.03 0.03 0.04* 0.05*   BNP (last 3 results) No results for input(s): BNP in the last 8760 hours.  ProBNP (last 3 results) No results for input(s): PROBNP in the last 8760 hours.  CBG: No results for input(s): GLUCAP in the last 168 hours.  No results found for this or any previous visit (from the past 240 hour(s)).   Studies: Ct Abdomen Pelvis Wo Contrast  10/16/2014   CLINICAL DATA:  Follow-up exam for assessment and plan care for abdominal wound, colostomy in left lower quadrant, abdominal infection  EXAM: CT ABDOMEN AND PELVIS WITHOUT CONTRAST  TECHNIQUE: Multidetector CT imaging of the abdomen and pelvis was performed following the standard protocol without IV contrast.  COMPARISON:  09/29/2014 and 10/14/2014  FINDINGS: Sagittal images of the spine shows degenerative changes thoracolumbar spine. Again noted extensive atherosclerotic  calcifications of abdominal aorta SMA bilateral common iliac arteries bilateral renal artery. There is small left pleural effusion. Trace right pleural effusion. Bilateral basilar posterior atelectasis. Coronary artery calcifications are again noted.  No calcified gallstones are noted within gallbladder. Oral contrast material was given to the patient. No intrahepatic biliary ductal dilatation. The unenhanced pancreas, spleen and adrenal glands are unremarkable. Stable saccular aneurysm infrarenal abdominal aorta without evidence of acute complication.  Multiple vascular calcifications are noted bilateral renal hilum. Again noted nonobstructive calcification in midpole of the left kidney measures 5.5 mm. Cortical scarring in midpole of the left kidney is again noted. Additional nonobstructive left renal calculi.  No hydronephrosis or hydroureter.  No calcified ureteral calculi are noted.  Again noted postsurgical changes post resection of distal left colon and sigmoid colon. A colostomy is noted in left lower quadrant. There is no evidence of colonic obstruction. No stricture is noted at the colostomy level.  The rectosigmoid pouch is unremarkable.  Again noted postsurgical changes with midline abdominal scarring. Again noted mild infiltration of deep fat plane just above the left rectus muscle at the base of incision and  small amount of air deep along the incision site. There is minimal swelling of the left rectus muscle. Although findings may be postsurgical in nature. Inflammatory changes and mild myositis cannot be excluded. Minimal confluent fluid is noted inferior aspect of the incision just above the fascia in axial image 69 measures about 1.7 cm without definite evidence of on abscess. Mild inflammatory changes/infection cannot be excluded.  There is contrast material within urinary bladder probable from previous CT scan.  There are mild distended small bowel loops in mid lower abdomen with contrast material  without evidence of small bowel obstruction. Probable mild ileus. No thickening of small bowel wall.  The patient is status post hysterectomy. The terminal ileum is unremarkable. Small amount of contrast is noted in distal esophagus probable from gastroesophageal reflux. No gastric outlet obstruction. There is mild anasarca infiltration of subcutaneous fat bilateral flank wall and bilateral lateral pelvic wall.  IMPRESSION: 1. Again noted postsurgical changes post resection of sigmoid colon and distal left colon. A colostomy is noted in left lower quadrant. There is no evidence of stricture at colostomy size. No evidence of colonic obstruction. 2. Again noted postsurgical changes with midline abdominal scarring. Again noted mild infiltration of deep fat plane just above the left rectus muscle at the base of incision and small amount of air deep along the incision site. There is minimal swelling of the left rectus muscle. Although findings may be postsurgical in nature. Inflammatory changes and mild myositis cannot be excluded. Minimal confluent fluid is noted inferior aspect of the incision just above the fascia in axial image 69 measures about 1.7 cm without definite evidence of on abscess. Mild inflammatory changes/infection cannot be excluded. 3. Mild dilated small bowel loops containing contrast in mid abdomen without evidence of small bowel obstruction. Findings probable reflect ileus. 4. No pericecal inflammation. 5. Again noted left nonobstructive nephrolithiasis. Scarring in left kidney again noted. No hydronephrosis or hydroureter. No calcified ureteral calculi. 6. Status post hysterectomy. 7.   Electronically Signed   By: Lahoma Crocker M.D.   On: 10/16/2014 15:21    Scheduled Meds: . amLODipine  10 mg Oral Daily  . [START ON 10/17/2014] aspirin  325 mg Oral Daily  . budesonide-formoterol  2 puff Inhalation BID  . docusate sodium  100 mg Oral BID  . feeding supplement (ENSURE ENLIVE)  237 mL Oral BID BM   . heparin  5,000 Units Subcutaneous 3 times per day  . isosorbide mononitrate  30 mg Oral Daily  . lip balm  1 application Topical BID  . metoprolol tartrate  12.5 mg Oral BID  . nicotine  7 mg Transdermal Daily  . pantoprazole  40 mg Oral Daily  . pneumococcal 23 valent vaccine  0.5 mL Intramuscular Tomorrow-1000  . polyethylene glycol  17 g Oral Daily  . saccharomyces boulardii  250 mg Oral BID  . sucralfate  1 g Oral TID WC & HS  . tiotropium  18 mcg Inhalation Daily    Continuous Infusions:     Time spent: 15 minutes  [facility] Pager [phone]. If 7PM-7AM, please contact night-coverage at [email or web address], password [facility] 10/16/2014, 5:29 PM  LOS: 17 days

## 2014-10-16 NOTE — Progress Notes (Signed)
Subjective:  No CP/SOB s/p cath revealing mod CAD , Med Rx  Objective:  Temp:  [98.2 F (36.8 C)-98.6 F (37 C)] 98.2 F (36.8 C) (06/16 0350) Pulse Rate:  [0-91] 63 (06/16 0350) Resp:  [0-51] 20 (06/16 0350) BP: (81-179)/(41-84) 115/68 mmHg (06/16 0350) SpO2:  [0 %-98 %] 98 % (06/16 0350) Weight:  [148 lb 5.9 oz (67.3 kg)] 148 lb 5.9 oz (67.3 kg) (06/16 0123) Weight change:   Intake/Output from previous day: 06/15 0701 - 06/16 0700 In: -  Out: 550 [Urine:550]  Intake/Output from this shift: Total I/O In: -  Out: 50 [Drains:50]  Physical Exam: General appearance: alert and no distress Neck: no adenopathy, no carotid bruit, no JVD, supple, symmetrical, trachea midline and thyroid not enlarged, symmetric, no tenderness/mass/nodules Lungs: clear to auscultation bilaterally Heart: regular rate and rhythm, S1, S2 normal, no murmur, click, rub or gallop Extremities: extremities normal, atraumatic, no cyanosis or edema  Lab Results: Results for orders placed or performed during the hospital encounter of 09/29/14 (from the past 48 hour(s))  Heparin level (unfractionated)     Status: None   Collection Time: 10/14/14  2:24 PM  Result Value Ref Range   Heparin Unfractionated 0.30 0.30 - 0.70 IU/mL    Comment:        IF HEPARIN RESULTS ARE BELOW EXPECTED VALUES, AND PATIENT DOSAGE HAS BEEN CONFIRMED, SUGGEST FOLLOW UP TESTING OF ANTITHROMBIN III LEVELS.   Heparin level (unfractionated)     Status: Abnormal   Collection Time: 10/14/14 11:02 PM  Result Value Ref Range   Heparin Unfractionated 0.22 (L) 0.30 - 0.70 IU/mL    Comment:        IF HEPARIN RESULTS ARE BELOW EXPECTED VALUES, AND PATIENT DOSAGE HAS BEEN CONFIRMED, SUGGEST FOLLOW UP TESTING OF ANTITHROMBIN III LEVELS.   Basic metabolic panel     Status: Abnormal   Collection Time: 10/15/14  7:53 AM  Result Value Ref Range   Sodium 146 (H) 135 - 145 mmol/L   Potassium 3.6 3.5 - 5.1 mmol/L   Chloride 108  101 - 111 mmol/L   CO2 29 22 - 32 mmol/L   Glucose, Bld 99 65 - 99 mg/dL   BUN 6 6 - 20 mg/dL   Creatinine, Ser 1.39 (H) 0.44 - 1.00 mg/dL   Calcium 8.5 (L) 8.9 - 10.3 mg/dL   GFR calc non Af Amer 39 (L) >60 mL/min   GFR calc Af Amer 45 (L) >60 mL/min    Comment: (NOTE) The eGFR has been calculated using the CKD EPI equation. This calculation has not been validated in all clinical situations. eGFR's persistently <60 mL/min signify possible Chronic Kidney Disease.    Anion gap 9 5 - 15  CBC     Status: Abnormal   Collection Time: 10/15/14  7:53 AM  Result Value Ref Range   WBC 11.0 (H) 4.0 - 10.5 K/uL   RBC 3.51 (L) 3.87 - 5.11 MIL/uL   Hemoglobin 10.3 (L) 12.0 - 15.0 g/dL   HCT 33.5 (L) 36.0 - 46.0 %   MCV 95.4 78.0 - 100.0 fL   MCH 29.3 26.0 - 34.0 pg   MCHC 30.7 30.0 - 36.0 g/dL   RDW 17.8 (H) 11.5 - 15.5 %   Platelets 294 150 - 400 K/uL  Heparin level (unfractionated)     Status: Abnormal   Collection Time: 10/15/14  7:53 AM  Result Value Ref Range   Heparin Unfractionated 0.21 (L) 0.30 -  0.70 IU/mL    Comment:        IF HEPARIN RESULTS ARE BELOW EXPECTED VALUES, AND PATIENT DOSAGE HAS BEEN CONFIRMED, SUGGEST FOLLOW UP TESTING OF ANTITHROMBIN III LEVELS.   CBC     Status: Abnormal   Collection Time: 10/16/14  5:30 AM  Result Value Ref Range   WBC 9.5 4.0 - 10.5 K/uL   RBC 3.42 (L) 3.87 - 5.11 MIL/uL   Hemoglobin 10.0 (L) 12.0 - 15.0 g/dL   HCT 32.9 (L) 36.0 - 46.0 %   MCV 96.2 78.0 - 100.0 fL   MCH 29.2 26.0 - 34.0 pg   MCHC 30.4 30.0 - 36.0 g/dL   RDW 17.2 (H) 11.5 - 15.5 %   Platelets 264 150 - 400 K/uL  Basic metabolic panel     Status: Abnormal   Collection Time: 10/16/14  5:30 AM  Result Value Ref Range   Sodium 144 135 - 145 mmol/L   Potassium 3.8 3.5 - 5.1 mmol/L   Chloride 104 101 - 111 mmol/L   CO2 30 22 - 32 mmol/L   Glucose, Bld 113 (H) 65 - 99 mg/dL   BUN 7 6 - 20 mg/dL   Creatinine, Ser 1.22 (H) 0.44 - 1.00 mg/dL   Calcium 8.2 (L) 8.9 - 10.3  mg/dL   GFR calc non Af Amer 45 (L) >60 mL/min   GFR calc Af Amer 52 (L) >60 mL/min    Comment: (NOTE) The eGFR has been calculated using the CKD EPI equation. This calculation has not been validated in all clinical situations. eGFR's persistently <60 mL/min signify possible Chronic Kidney Disease.    Anion gap 10 5 - 15  Magnesium     Status: None   Collection Time: 10/16/14  5:30 AM  Result Value Ref Range   Magnesium 1.8 1.7 - 2.4 mg/dL    Imaging: Imaging results have been reviewed  Assessment/Plan:   1. Principal Problem: 2.   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 Active Problems: 4.   Acute renal failure 5.   Tobacco user 6.   Colovaginal fistula s/p colectomy/colostomy/repair 10/02/2014 7.   Essential hypertension 8.   COPD (chronic obstructive pulmonary disease) 9.   Abdominal pain 10.   CAD in native artery 11.   CKD (chronic kidney disease) stage 3, GFR 30-59 ml/min 12.   GERD (gastroesophageal reflux disease) 13.   Bacteriuria 14.   Pre-operative cardiovascular examination, myocardial ischemia 15.   ARF (acute renal failure) 16.   Acute cystitis without hematuria 17.   E-coli UTI 18.   Leukocytosis 19.   Hypoxia 20.   Ileus 21.   Time Spent Directly with Patient:  20 minutes  Length of Stay:  LOS: 17 days   S/P cath revealing 3VD, moderate, with rec of Med Rx. Can undergo Surgery if necessary at acceptable CV risk. Exam benign. No further Rec at this time. Will s/o and be avail for further questions.  Alexa Key 10/16/2014, 12:28 PM

## 2014-10-17 LAB — CBC
HCT: 34.6 % — ABNORMAL LOW (ref 36.0–46.0)
Hemoglobin: 10.7 g/dL — ABNORMAL LOW (ref 12.0–15.0)
MCH: 30 pg (ref 26.0–34.0)
MCHC: 30.9 g/dL (ref 30.0–36.0)
MCV: 96.9 fL (ref 78.0–100.0)
PLATELETS: 278 10*3/uL (ref 150–400)
RBC: 3.57 MIL/uL — AB (ref 3.87–5.11)
RDW: 16.6 % — ABNORMAL HIGH (ref 11.5–15.5)
WBC: 9.3 10*3/uL (ref 4.0–10.5)

## 2014-10-17 LAB — BASIC METABOLIC PANEL
ANION GAP: 9 (ref 5–15)
BUN: 7 mg/dL (ref 6–20)
CO2: 31 mmol/L (ref 22–32)
CREATININE: 1.34 mg/dL — AB (ref 0.44–1.00)
Calcium: 8.4 mg/dL — ABNORMAL LOW (ref 8.9–10.3)
Chloride: 102 mmol/L (ref 101–111)
GFR calc Af Amer: 47 mL/min — ABNORMAL LOW (ref >60)
GFR calc non Af Amer: 40 mL/min — ABNORMAL LOW (ref >60)
Glucose, Bld: 94 mg/dL (ref 65–99)
Potassium: 3.4 mmol/L — ABNORMAL LOW (ref 3.5–5.1)
Sodium: 142 mmol/L (ref 135–145)

## 2014-10-17 MED ORDER — FUROSEMIDE 40 MG PO TABS
40.0000 mg | ORAL_TABLET | Freq: Two times a day (BID) | ORAL | Status: DC
  Administered 2014-10-17 – 2014-10-19 (×4): 40 mg via ORAL
  Filled 2014-10-17 (×4): qty 1

## 2014-10-17 MED ORDER — FUROSEMIDE 10 MG/ML IJ SOLN
20.0000 mg | Freq: Two times a day (BID) | INTRAMUSCULAR | Status: DC
  Filled 2014-10-17: qty 2

## 2014-10-17 MED ORDER — HYDRALAZINE HCL 10 MG PO TABS
10.0000 mg | ORAL_TABLET | Freq: Four times a day (QID) | ORAL | Status: DC | PRN
  Administered 2014-10-17: 10 mg via ORAL
  Filled 2014-10-17: qty 1

## 2014-10-17 NOTE — Progress Notes (Signed)
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 Plan was for surgical intervention of fistula versus stricture with cardiac catheterization (after patient had failed Myoview ) done prior to surgery for clearance. Patient unable to get cardiac catheterization due to acute renal failure on day of catheterization. 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  Following surgery, patient with persistent leukocytosis and found to have UTI. Cardiology will be consulted after patient started having chest pain with mildly elevated troponin. On 6/13, patient's hemoglobin had dropped to as low as 7. She did not have an acute drop slowly trending down since admission.  She has also since developed postop ileus.  Patient's creatinine has remained stable. Patient underwent cardiac catheterization on 6/15 noting three-vessel disease although moderate. Only a very distal vessel noted up to 70% blockage. Plan is to manage this medically and if patient needs surgery, felt to be an acceptable cardiovascular risk.  After reviewing repeat CT scan, surgery is planned is to try to manage conservatively. Diet advanced and so far patient tolerating well. She continues to do this that she can possibly discharge early next week although this is separate from the possibly of whether or not she will need surgery long-term.  Patient self doing okay today. No complaints. Tolerating by mouth  Assessment/Plan:    Tobacco user: Counseled. On nicotine patch.    COPD (chronic obstructive pulmonary disease):  Stable.    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  Following catheterization, stoma to be evaluated. After reviewing new CT, surgery wants to wait and observe to see if fistula will open up. Continue wound VAC.     CAD with mild non-STEMI and ischemia.: Three-vessel moderate disease. Stable  Acute on chronic diastolic heart failure: Patient up 6 pounds a normal spine liters With renal function almost to baseline, started on IV Lasix yesterday and diuresed 2 L right away    GERD (gastroesophageal reflux disease)  Acute kidney injury in the setting of stage III chronic kidney disease: Back to near baseline. Felt to be secondary to recent CT scan done with contrast plus episodes of emesis plus poor oral intake. Renal ultrasound unrevealing.       E-coli UTI: Completed anti-biotic course  Code Status: Full code  Family Communication:  Left message with daughter  Disposition Plan: Managing conservatively. If she continues to tolerate by mouth, discharge likely beginning of next week Consultants:  Cardiology  General surgery  Procedures: Sigmoid colon colectomy with in colostomy takedown of colovaginal fistula per Dr. Brantley Stage 10/02/2014 Cardiac catheterization 6/15: Three-vessel moderate disease  Antibiotics:  IV Levaquin 10/01/2014 >>> 10/03/2014  IV Rocephin 10/03/2014>>>>> 10/07/2014  Oral Ceftin 10/07/2014 x 1 day   Objective: BP 168/61 mmHg  Pulse 72  Temp(Src) 98.6 F (37 C) (Oral)  Resp 20  Ht 5\' 1"  (1.549 m)  Wt 66.9 kg (147 lb 7.8 oz)  BMI 27.88 kg/m2  SpO2 95%  Intake/Output Summary (Last 24 hours) at 10/17/14 1419 Last data filed at 10/17/14 1335  Gross per 24 hour  Intake  960 ml  Output   2250 ml  Net  -1290 ml   Filed Weights   10/16/14 0036 10/16/14 0123 10/17/14 0605  Weight: 67.3 kg (148 lb 5.9 oz) 67.3 kg (148 lb 5.9 oz) 66.9 kg (147  lb 7.8 oz)    Exam:   General:  Alert and oriented 3, no acute distress  Cardiovascular: Regular rate and rhythm, J6-E8, 2/6 systolic ejection murmur  Respiratory: Poor inspiratory effort, decreased at bases  Abdomen: Soft, nontender, ostomy noted, some bowel sounds bowel sounds, wound VAC in place  Musculoskeletal: Trace edema   Data Reviewed: Basic Metabolic Panel:  Recent Labs Lab 10/11/14 0451  10/13/14 1159 10/14/14 0351 10/15/14 0753 10/16/14 0530 10/17/14 0331  NA 145  < > 147* 148* 146* 144 142  K 3.9  < > 3.3* 3.9 3.6 3.8 3.4*  CL 111  < > 113* 111 108 104 102  CO2 25  < > 27 29 29 30 31   GLUCOSE 91  < > 113* 112* 99 113* 94  BUN 11  < > 10 8 6 7 7   CREATININE 1.18*  < > 1.31* 1.27* 1.39* 1.22* 1.34*  CALCIUM 8.1*  < > 8.2* 8.5* 8.5* 8.2* 8.4*  MG 2.1  --   --   --   --  1.8  --   < > = values in this interval not displayed. Liver Function Tests: No results for input(s): AST, ALT, ALKPHOS, BILITOT, PROT, ALBUMIN in the last 168 hours. No results for input(s): LIPASE, AMYLASE in the last 168 hours. No results for input(s): AMMONIA in the last 168 hours. CBC:  Recent Labs Lab 10/13/14 0505 10/14/14 0351 10/15/14 0753 10/16/14 0530 10/17/14 0331  WBC 13.3* 13.9* 11.0* 9.5 9.3  HGB 7.8* 10.5* 10.3* 10.0* 10.7*  HCT 26.0* 33.7* 33.5* 32.9* 34.6*  MCV 101.2* 94.7 95.4 96.2 96.9  PLT 277 302 294 264 278   Cardiac Enzymes:    Recent Labs Lab 10/12/14 2251 10/13/14 0505 10/13/14 1159 10/13/14 2107 10/14/14 0351  TROPONINI 0.04* 0.03 0.03 0.04* 0.05*   BNP (last 3 results) No results for input(s): BNP in the last 8760 hours.  ProBNP (last 3 results) No results for input(s): PROBNP in the last 8760 hours.  CBG: No results for input(s): GLUCAP in the last 168 hours.  No results found for this or any previous visit (from the past 240 hour(s)).   Studies: Ct Abdomen Pelvis Wo Contrast  10/16/2014   CLINICAL DATA:  Follow-up exam for  assessment and plan care for abdominal wound, colostomy in left lower quadrant, abdominal infection  EXAM: CT ABDOMEN AND PELVIS WITHOUT CONTRAST  TECHNIQUE: Multidetector CT imaging of the abdomen and pelvis was performed following the standard protocol without IV contrast.  COMPARISON:  09/29/2014 and 10/14/2014  FINDINGS: Sagittal images of the spine shows degenerative changes thoracolumbar spine. Again noted extensive atherosclerotic calcifications of abdominal aorta SMA bilateral common iliac arteries bilateral renal artery. There is small left pleural effusion. Trace right pleural effusion. Bilateral basilar posterior atelectasis. Coronary artery calcifications are again noted.  No calcified gallstones are noted within gallbladder. Oral contrast material was given to the patient. No intrahepatic biliary ductal dilatation. The unenhanced pancreas, spleen and adrenal glands are unremarkable. Stable saccular aneurysm infrarenal abdominal aorta without evidence of acute complication.  Multiple vascular calcifications are noted bilateral renal hilum. Again noted nonobstructive calcification in midpole of the left kidney measures 5.5 mm. Cortical scarring in midpole of the left kidney is  again noted. Additional nonobstructive left renal calculi.  No hydronephrosis or hydroureter.  No calcified ureteral calculi are noted.  Again noted postsurgical changes post resection of distal left colon and sigmoid colon. A colostomy is noted in left lower quadrant. There is no evidence of colonic obstruction. No stricture is noted at the colostomy level.  The rectosigmoid pouch is unremarkable.  Again noted postsurgical changes with midline abdominal scarring. Again noted mild infiltration of deep fat plane just above the left rectus muscle at the base of incision and small amount of air deep along the incision site. There is minimal swelling of the left rectus muscle. Although findings may be postsurgical in nature.  Inflammatory changes and mild myositis cannot be excluded. Minimal confluent fluid is noted inferior aspect of the incision just above the fascia in axial image 69 measures about 1.7 cm without definite evidence of on abscess. Mild inflammatory changes/infection cannot be excluded.  There is contrast material within urinary bladder probable from previous CT scan.  There are mild distended small bowel loops in mid lower abdomen with contrast material without evidence of small bowel obstruction. Probable mild ileus. No thickening of small bowel wall.  The patient is status post hysterectomy. The terminal ileum is unremarkable. Small amount of contrast is noted in distal esophagus probable from gastroesophageal reflux. No gastric outlet obstruction. There is mild anasarca infiltration of subcutaneous fat bilateral flank wall and bilateral lateral pelvic wall.  IMPRESSION: 1. Again noted postsurgical changes post resection of sigmoid colon and distal left colon. A colostomy is noted in left lower quadrant. There is no evidence of stricture at colostomy size. No evidence of colonic obstruction. 2. Again noted postsurgical changes with midline abdominal scarring. Again noted mild infiltration of deep fat plane just above the left rectus muscle at the base of incision and small amount of air deep along the incision site. There is minimal swelling of the left rectus muscle. Although findings may be postsurgical in nature. Inflammatory changes and mild myositis cannot be excluded. Minimal confluent fluid is noted inferior aspect of the incision just above the fascia in axial image 69 measures about 1.7 cm without definite evidence of on abscess. Mild inflammatory changes/infection cannot be excluded. 3. Mild dilated small bowel loops containing contrast in mid abdomen without evidence of small bowel obstruction. Findings probable reflect ileus. 4. No pericecal inflammation. 5. Again noted left nonobstructive nephrolithiasis.  Scarring in left kidney again noted. No hydronephrosis or hydroureter. No calcified ureteral calculi. 6. Status post hysterectomy. 7.   Electronically Signed   By: Lahoma Crocker M.D.   On: 10/16/2014 15:21    Scheduled Meds: . amLODipine  10 mg Oral Daily  . aspirin  325 mg Oral Daily  . budesonide-formoterol  2 puff Inhalation BID  . docusate sodium  100 mg Oral BID  . feeding supplement (ENSURE ENLIVE)  237 mL Oral BID BM  . furosemide  20 mg Intravenous Daily  . heparin  5,000 Units Subcutaneous 3 times per day  . isosorbide mononitrate  30 mg Oral Daily  . lip balm  1 application Topical BID  . metoprolol tartrate  12.5 mg Oral BID  . nicotine  7 mg Transdermal Daily  . pantoprazole  40 mg Oral Daily  . pneumococcal 23 valent vaccine  0.5 mL Intramuscular Tomorrow-1000  . polyethylene glycol  17 g Oral Daily  . saccharomyces boulardii  250 mg Oral BID  . sucralfate  1 g Oral TID WC & HS  .  tiotropium  18 mcg Inhalation Daily    Continuous Infusions:     Time spent: 15 minutes  [facility] Pager [phone]. If 7PM-7AM, please contact night-coverage at [email or web address], password [facility] 10/17/2014, 2:19 PM  LOS: 18 days

## 2014-10-17 NOTE — Progress Notes (Signed)
2 Days Post-Op  Subjective: Feels great, no pain, tol breakfast  Objective: Vital signs in last 24 hours: Temp:  [98 F (36.7 C)-99.3 F (37.4 C)] 98.6 F (37 C) (06/17 0605) Pulse Rate:  [61-74] 72 (06/17 0605) Resp:  [20] 20 (06/17 0605) BP: (155-202)/(58-68) 168/61 mmHg (06/17 0605) SpO2:  [92 %-98 %] 95 % (06/17 0803) Weight:  [66.9 kg (147 lb 7.8 oz)] 66.9 kg (147 lb 7.8 oz) (06/17 0605) Last BM Date: 10/16/14  Intake/Output from previous day: 06/16 0701 - 06/17 0700 In: 480 [P.O.:480] Out: 2650 [Urine:2100; Drains:50; Stool:500] Intake/Output this shift: Total I/O In: 240 [P.O.:240] Out: 250 [Urine:250]  GI: soft stoma unchanged this is functional vac in place drainage less murky today  Lab Results:   Recent Labs  10/16/14 0530 10/17/14 0331  WBC 9.5 9.3  HGB 10.0* 10.7*  HCT 32.9* 34.6*  PLT 264 278   BMET  Recent Labs  10/16/14 0530 10/17/14 0331  NA 144 142  K 3.8 3.4*  CL 104 102  CO2 30 31  GLUCOSE 113* 94  BUN 7 7  CREATININE 1.22* 1.34*  CALCIUM 8.2* 8.4*   PT/INR No results for input(s): LABPROT, INR in the last 72 hours. ABG No results for input(s): PHART, HCO3 in the last 72 hours.  Invalid input(s): PCO2, PO2  Studies/Results: Ct Abdomen Pelvis Wo Contrast  10/16/2014   CLINICAL DATA:  Follow-up exam for assessment and plan care for abdominal wound, colostomy in left lower quadrant, abdominal infection  EXAM: CT ABDOMEN AND PELVIS WITHOUT CONTRAST  TECHNIQUE: Multidetector CT imaging of the abdomen and pelvis was performed following the standard protocol without IV contrast.  COMPARISON:  09/29/2014 and 10/14/2014  FINDINGS: Sagittal images of the spine shows degenerative changes thoracolumbar spine. Again noted extensive atherosclerotic calcifications of abdominal aorta SMA bilateral common iliac arteries bilateral renal artery. There is small left pleural effusion. Trace right pleural effusion. Bilateral basilar posterior atelectasis.  Coronary artery calcifications are again noted.  No calcified gallstones are noted within gallbladder. Oral contrast material was given to the patient. No intrahepatic biliary ductal dilatation. The unenhanced pancreas, spleen and adrenal glands are unremarkable. Stable saccular aneurysm infrarenal abdominal aorta without evidence of acute complication.  Multiple vascular calcifications are noted bilateral renal hilum. Again noted nonobstructive calcification in midpole of the left kidney measures 5.5 mm. Cortical scarring in midpole of the left kidney is again noted. Additional nonobstructive left renal calculi.  No hydronephrosis or hydroureter.  No calcified ureteral calculi are noted.  Again noted postsurgical changes post resection of distal left colon and sigmoid colon. A colostomy is noted in left lower quadrant. There is no evidence of colonic obstruction. No stricture is noted at the colostomy level.  The rectosigmoid pouch is unremarkable.  Again noted postsurgical changes with midline abdominal scarring. Again noted mild infiltration of deep fat plane just above the left rectus muscle at the base of incision and small amount of air deep along the incision site. There is minimal swelling of the left rectus muscle. Although findings may be postsurgical in nature. Inflammatory changes and mild myositis cannot be excluded. Minimal confluent fluid is noted inferior aspect of the incision just above the fascia in axial image 69 measures about 1.7 cm without definite evidence of on abscess. Mild inflammatory changes/infection cannot be excluded.  There is contrast material within urinary bladder probable from previous CT scan.  There are mild distended small bowel loops in mid lower abdomen with contrast material without  evidence of small bowel obstruction. Probable mild ileus. No thickening of small bowel wall.  The patient is status post hysterectomy. The terminal ileum is unremarkable. Small amount of  contrast is noted in distal esophagus probable from gastroesophageal reflux. No gastric outlet obstruction. There is mild anasarca infiltration of subcutaneous fat bilateral flank wall and bilateral lateral pelvic wall.  IMPRESSION: 1. Again noted postsurgical changes post resection of sigmoid colon and distal left colon. A colostomy is noted in left lower quadrant. There is no evidence of stricture at colostomy size. No evidence of colonic obstruction. 2. Again noted postsurgical changes with midline abdominal scarring. Again noted mild infiltration of deep fat plane just above the left rectus muscle at the base of incision and small amount of air deep along the incision site. There is minimal swelling of the left rectus muscle. Although findings may be postsurgical in nature. Inflammatory changes and mild myositis cannot be excluded. Minimal confluent fluid is noted inferior aspect of the incision just above the fascia in axial image 69 measures about 1.7 cm without definite evidence of on abscess. Mild inflammatory changes/infection cannot be excluded. 3. Mild dilated small bowel loops containing contrast in mid abdomen without evidence of small bowel obstruction. Findings probable reflect ileus. 4. No pericecal inflammation. 5. Again noted left nonobstructive nephrolithiasis. Scarring in left kidney again noted. No hydronephrosis or hydroureter. No calcified ureteral calculi. 6. Status post hysterectomy. 7.   Electronically Signed   By: Lahoma Crocker M.D.   On: 10/16/2014 15:21    Anti-infectives: Anti-infectives    Start     Dose/Rate Route Frequency Ordered Stop   10/07/14 1800  cefUROXime (CEFTIN) tablet 500 mg  Status:  Discontinued     500 mg Oral 2 times daily with meals 10/07/14 1203 10/11/14 1611   10/03/14 1530  cefTRIAXone (ROCEPHIN) 1 g in dextrose 5 % 50 mL IVPB - Premix  Status:  Discontinued     1 g 100 mL/hr over 30 Minutes Intravenous Every 24 hours 10/03/14 1454 10/07/14 1203   10/02/14  2200  Levofloxacin (LEVAQUIN) IVPB 250 mg  Status:  Discontinued     250 mg 50 mL/hr over 60 Minutes Intravenous Every 24 hours 10/01/14 2105 10/02/14 2006   10/02/14 2200  Levofloxacin (LEVAQUIN) IVPB 250 mg  Status:  Discontinued     250 mg 50 mL/hr over 60 Minutes Intravenous Every 24 hours 10/02/14 1000 10/03/14 1454   10/02/14 0800  clindamycin (CLEOCIN) IVPB 900 mg     900 mg 100 mL/hr over 30 Minutes Intravenous To [facility] 10/01/14 1238 10/02/14 0933   10/02/14 0800  gentamicin (GARAMYCIN) IVPB 100 mg     100 mg 200 mL/hr over 30 Minutes Intravenous To [facility] 10/01/14 1238 10/02/14 0938   10/01/14 2200  levofloxacin (LEVAQUIN) IVPB 500 mg     500 mg 100 mL/hr over 60 Minutes Intravenous  Once 10/01/14 2059 10/02/14 0104      Assessment/Plan: POD #15  sigmoid colectomy, colovaginal fistula takedown and colostomy--Dr. Brantley Stage has issue that may end up needing to go to or but I think options are limited if that is the case and there is certainly some risk.  She is clinically well although her stoma is not well.  It is functional however.  I think continued conservative mgt with understanding there is short term and long term (stricture) risk of needing revision.  She is agreeable to this I think if does well over weekend she may be able  to be discharged.   [facility] 10/17/2014

## 2014-10-17 NOTE — Progress Notes (Signed)
PT Cancellation Note  Patient Details Name: Alexa Key MRN: [id number] DOB: 1950/12/20   Cancelled Treatment:    Reason Eval/Treat Not Completed: Other (comment) Attempted to see patient for mobility session, patient was in hall with nursing tech ambulating at this time. Spoke with patient who states that she feels much better today and was tolerating walk well. Will hold formal PT session at this time and allowed patient to continue ambulation with staff, patient in agreement. Will continue to see and progress as tolerated.   Duncan Dull 10/17/2014, 4:42 PM Alben Deeds, [place] DPT  [phone]

## 2014-10-18 DIAGNOSIS — R1084 Generalized abdominal pain: Secondary | ICD-10-CM

## 2014-10-18 LAB — CBC
HCT: 35 % — ABNORMAL LOW (ref 36.0–46.0)
Hemoglobin: 10.7 g/dL — ABNORMAL LOW (ref 12.0–15.0)
MCH: 29.4 pg (ref 26.0–34.0)
MCHC: 30.6 g/dL (ref 30.0–36.0)
MCV: 96.2 fL (ref 78.0–100.0)
Platelets: 234 10*3/uL (ref 150–400)
RBC: 3.64 MIL/uL — ABNORMAL LOW (ref 3.87–5.11)
RDW: 16.3 % — ABNORMAL HIGH (ref 11.5–15.5)
WBC: 9.3 10*3/uL (ref 4.0–10.5)

## 2014-10-18 LAB — BASIC METABOLIC PANEL
Anion gap: 12 (ref 5–15)
BUN: 10 mg/dL (ref 6–20)
CALCIUM: 8.2 mg/dL — AB (ref 8.9–10.3)
CO2: 30 mmol/L (ref 22–32)
CREATININE: 1.53 mg/dL — AB (ref 0.44–1.00)
Chloride: 100 mmol/L — ABNORMAL LOW (ref 101–111)
GFR calc non Af Amer: 34 mL/min — ABNORMAL LOW (ref >60)
GFR, EST AFRICAN AMERICAN: 40 mL/min — AB (ref >60)
Glucose, Bld: 95 mg/dL (ref 65–99)
POTASSIUM: 3.4 mmol/L — AB (ref 3.5–5.1)
SODIUM: 142 mmol/L (ref 135–145)

## 2014-10-18 NOTE — Progress Notes (Signed)
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 Plan was for surgical intervention of fistula versus stricture with cardiac catheterization (after patient had failed Myoview ) done prior to surgery for clearance. Patient unable to get cardiac catheterization due to acute renal failure on day of catheterization. 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  Following surgery, patient with persistent leukocytosis and found to have UTI. Cardiology will be consulted after patient started having chest pain with mildly elevated troponin. On 6/13, patient's hemoglobin had dropped to as low as 7. She did not have an acute drop slowly trending down since admission.  She has also since developed postop ileus.  Patient's creatinine has remained stable. Patient underwent cardiac catheterization on 6/15 noting three-vessel disease although moderate. Only a very distal vessel noted up to 70% blockage. Plan is to manage this medically and if patient needs surgery, felt to be an acceptable cardiovascular risk.  After reviewing repeat CT scan, surgery is planned is to try to manage conservatively. Diet advanced and so far patient tolerating well. She continues to do this that she can possibly discharge early next week although this is separate from the possibly of whether or not she will need surgery long-term.  Patient feels very good. No complaints. Has been ambulating and tolerating by mouth in the last 24 hours  Assessment/Plan:    Tobacco user: Counseled. On nicotine patch.    COPD  (chronic obstructive pulmonary disease): Stable.    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  Following catheterization, stoma to be evaluated. After reviewing new CT, surgery wants to wait and observe to see if fistula will open up. Continue wound VAC. Seems to be doing well manage conservatively for now    CAD with mild non-STEMI and ischemia.: Three-vessel moderate disease. Stable  Acute on chronic diastolic heart failure: Diuresed well and is down to 142 pounds, suspected dry weight. Lasix eased back    GERD (gastroesophageal reflux disease)  Acute kidney injury in the setting of stage III chronic kidney disease: Back to near baseline. Felt to be secondary to recent CT scan done with contrast plus episodes of emesis plus poor oral intake. Renal ultrasound unrevealing. Creatinine slightly up today, likely as a sign of minimal overdiuresis. Lasix eased back.      E-coli UTI: Completed anti-biotic course  Code Status: Full code  Family Communication:  Left message with daughter  Disposition Plan: If continues to do well, return to skilled nursing on Monday.  Consultants:  Cardiology  General surgery  Procedures: Sigmoid colon colectomy with in colostomy takedown of colovaginal fistula per Dr. Brantley Stage 10/02/2014 Cardiac catheterization 6/15: Three-vessel moderate disease  Antibiotics:  IV Levaquin 10/01/2014 >>> 10/03/2014  IV Rocephin 10/03/2014>>>>> 10/07/2014  Oral Ceftin 10/07/2014 x 1 day   Objective: BP 114/69 mmHg  Pulse 62  Temp(Src) 98.8 F (37.1 C) (Oral)  Resp 18  Ht 5\' 1"  (1.549 m)  Wt 64.592 kg (142 lb 6.4 oz)  BMI 26.92 kg/m2  SpO2 100%  Intake/Output Summary (Last 24 hours) at 10/18/14  [place] filed at 10/18/14 1556  Gross per 24 hour  Intake    600 ml  Output    275 ml  Net    325 ml   Filed Weights   10/16/14 0123 10/17/14  0605 10/18/14 0551  Weight: 67.3 kg (148 lb 5.9 oz) 66.9 kg (147 lb 7.8 oz) 64.592 kg (142 lb 6.4 oz)    Exam: No change from yesterday  General:  Alert and oriented 3, no acute distress  Cardiovascular: Regular rate and rhythm, Q2-W9, 2/6 systolic ejection murmur  Respiratory: Clear to auscultation bilaterally  Abdomen: Soft, nontender, ostomy noted, some bowel sounds, wound VAC in place  Musculoskeletal: Trace edema   Data Reviewed: Basic Metabolic Panel:  Recent Labs Lab 10/14/14 0351 10/15/14 0753 10/16/14 0530 10/17/14 0331 10/18/14 0410  NA 148* 146* 144 142 142  K 3.9 3.6 3.8 3.4* 3.4*  CL 111 108 104 102 100*  CO2 29 29 30 31 30   GLUCOSE 112* 99 113* 94 95  BUN 8 6 7 7 10   CREATININE 1.27* 1.39* 1.22* 1.34* 1.53*  CALCIUM 8.5* 8.5* 8.2* 8.4* 8.2*  MG  --   --  1.8  --   --    Liver Function Tests: No results for input(s): AST, ALT, ALKPHOS, BILITOT, PROT, ALBUMIN in the last 168 hours. No results for input(s): LIPASE, AMYLASE in the last 168 hours. No results for input(s): AMMONIA in the last 168 hours. CBC:  Recent Labs Lab 10/14/14 0351 10/15/14 0753 10/16/14 0530 10/17/14 0331 10/18/14 0410  WBC 13.9* 11.0* 9.5 9.3 9.3  HGB 10.5* 10.3* 10.0* 10.7* 10.7*  HCT 33.7* 33.5* 32.9* 34.6* 35.0*  MCV 94.7 95.4 96.2 96.9 96.2  PLT 302 294 264 278 234   Cardiac Enzymes:    Recent Labs Lab 10/12/14 2251 10/13/14 0505 10/13/14 1159 10/13/14 2107 10/14/14 0351  TROPONINI 0.04* 0.03 0.03 0.04* 0.05*   BNP (last 3 results) No results for input(s): BNP in the last 8760 hours.  ProBNP (last 3 results) No results for input(s): PROBNP in the last 8760 hours.  CBG: No results for input(s): GLUCAP in the last 168 hours.  No results found for this or any previous visit (from the past 240 hour(s)).   Studies: No results found.  Scheduled Meds: . amLODipine  10 mg Oral Daily  . aspirin  325 mg Oral Daily  . budesonide-formoterol  2 puff  Inhalation BID  . docusate sodium  100 mg Oral BID  . feeding supplement (ENSURE ENLIVE)  237 mL Oral BID BM  . furosemide  40 mg Oral BID  . heparin  5,000 Units Subcutaneous 3 times per day  . isosorbide mononitrate  30 mg Oral Daily  . lip balm  1 application Topical BID  . metoprolol tartrate  12.5 mg Oral BID  . nicotine  7 mg Transdermal Daily  . pantoprazole  40 mg Oral Daily  . pneumococcal 23 valent vaccine  0.5 mL Intramuscular Tomorrow-1000  . polyethylene glycol  17 g Oral Daily  . saccharomyces boulardii  250 mg Oral BID  . sucralfate  1 g Oral TID WC & HS  . tiotropium  18 mcg Inhalation Daily    Continuous Infusions:     Time spent: 15 minutes  [place] [facility] Pager [phone]. If 7PM-7AM, please contact night-coverage at [email or web address], password [facility] 10/18/2014, 5:34 PM  LOS: 19 days

## 2014-10-18 NOTE — Progress Notes (Signed)
Patient ID: Alexa Key, female   DOB: [birth year], 64 y.o.   MRN: [id number]  Subjective: No complaints this morning. She did have 1 episode of emesis last night but she feels that it was more reflux or something getting stuck in her throat Rather than actual nausea and vomiting. Stoma functioning.  Objective: Vital signs in last 24 hours: Temp:  [98.4 F (36.9 C)-100 F (37.8 C)] 98.7 F (37.1 C) (06/18 0551) Pulse Rate:  [65-78] 65 (06/18 0551) Resp:  [18-19] 18 (06/18 0551) BP: (127-172)/(47-60) 127/47 mmHg (06/18 0551) SpO2:  [93 %-96 %] 96 % (06/18 0551) Weight:  [64.592 kg (142 lb 6.4 oz)] 64.592 kg (142 lb 6.4 oz) (06/18 0551) Last BM Date: 10/17/14  Intake/Output from previous day: 06/17 0701 - 06/18 0700 In: 720 [P.O.:720] Out: 1150 [Urine:1150] Intake/Output this shift:    General appearance: alert, cooperative and no distress GI: soft and nontender and nondistended. Stomal retraction. Incision/Wound: VAC in place, no erythema or unusual drainage  Lab Results:   Recent Labs  10/17/14 0331 10/18/14 0410  WBC 9.3 9.3  HGB 10.7* 10.7*  HCT 34.6* 35.0*  PLT 278 234   BMET  Recent Labs  10/17/14 0331 10/18/14 0410  NA 142 142  K 3.4* 3.4*  CL 102 100*  CO2 31 30  GLUCOSE 94 95  BUN 7 10  CREATININE 1.34* 1.53*  CALCIUM 8.4* 8.2*     Studies/Results: Ct Abdomen Pelvis Wo Contrast  10/16/2014   CLINICAL DATA:  Follow-up exam for assessment and plan care for abdominal wound, colostomy in left lower quadrant, abdominal infection  EXAM: CT ABDOMEN AND PELVIS WITHOUT CONTRAST  TECHNIQUE: Multidetector CT imaging of the abdomen and pelvis was performed following the standard protocol without IV contrast.  COMPARISON:  09/29/2014 and 10/14/2014  FINDINGS: Sagittal images of the spine shows degenerative changes thoracolumbar spine. Again noted extensive atherosclerotic calcifications of abdominal aorta SMA bilateral common iliac arteries bilateral renal artery.  There is small left pleural effusion. Trace right pleural effusion. Bilateral basilar posterior atelectasis. Coronary artery calcifications are again noted.  No calcified gallstones are noted within gallbladder. Oral contrast material was given to the patient. No intrahepatic biliary ductal dilatation. The unenhanced pancreas, spleen and adrenal glands are unremarkable. Stable saccular aneurysm infrarenal abdominal aorta without evidence of acute complication.  Multiple vascular calcifications are noted bilateral renal hilum. Again noted nonobstructive calcification in midpole of the left kidney measures 5.5 mm. Cortical scarring in midpole of the left kidney is again noted. Additional nonobstructive left renal calculi.  No hydronephrosis or hydroureter.  No calcified ureteral calculi are noted.  Again noted postsurgical changes post resection of distal left colon and sigmoid colon. A colostomy is noted in left lower quadrant. There is no evidence of colonic obstruction. No stricture is noted at the colostomy level.  The rectosigmoid pouch is unremarkable.  Again noted postsurgical changes with midline abdominal scarring. Again noted mild infiltration of deep fat plane just above the left rectus muscle at the base of incision and small amount of air deep along the incision site. There is minimal swelling of the left rectus muscle. Although findings may be postsurgical in nature. Inflammatory changes and mild myositis cannot be excluded. Minimal confluent fluid is noted inferior aspect of the incision just above the fascia in axial image 69 measures about 1.7 cm without definite evidence of on abscess. Mild inflammatory changes/infection cannot be excluded.  There is contrast material within urinary bladder probable from previous CT  scan.  There are mild distended small bowel loops in mid lower abdomen with contrast material without evidence of small bowel obstruction. Probable mild ileus. No thickening of small  bowel wall.  The patient is status post hysterectomy. The terminal ileum is unremarkable. Small amount of contrast is noted in distal esophagus probable from gastroesophageal reflux. No gastric outlet obstruction. There is mild anasarca infiltration of subcutaneous fat bilateral flank wall and bilateral lateral pelvic wall.  IMPRESSION: 1. Again noted postsurgical changes post resection of sigmoid colon and distal left colon. A colostomy is noted in left lower quadrant. There is no evidence of stricture at colostomy size. No evidence of colonic obstruction. 2. Again noted postsurgical changes with midline abdominal scarring. Again noted mild infiltration of deep fat plane just above the left rectus muscle at the base of incision and small amount of air deep along the incision site. There is minimal swelling of the left rectus muscle. Although findings may be postsurgical in nature. Inflammatory changes and mild myositis cannot be excluded. Minimal confluent fluid is noted inferior aspect of the incision just above the fascia in axial image 69 measures about 1.7 cm without definite evidence of on abscess. Mild inflammatory changes/infection cannot be excluded. 3. Mild dilated small bowel loops containing contrast in mid abdomen without evidence of small bowel obstruction. Findings probable reflect ileus. 4. No pericecal inflammation. 5. Again noted left nonobstructive nephrolithiasis. Scarring in left kidney again noted. No hydronephrosis or hydroureter. No calcified ureteral calculi. 6. Status post hysterectomy. 7.   Electronically Signed   By: Lahoma Crocker M.D.   On: 10/16/2014 15:21    Anti-infectives: Anti-infectives    Start     Dose/Rate Route Frequency Ordered Stop   10/07/14 1800  cefUROXime (CEFTIN) tablet 500 mg  Status:  Discontinued     500 mg Oral 2 times daily with meals 10/07/14 1203 10/11/14 1611   10/03/14 1530  cefTRIAXone (ROCEPHIN) 1 g in dextrose 5 % 50 mL IVPB - Premix  Status:   Discontinued     1 g 100 mL/hr over 30 Minutes Intravenous Every 24 hours 10/03/14 1454 10/07/14 1203   10/02/14 2200  Levofloxacin (LEVAQUIN) IVPB 250 mg  Status:  Discontinued     250 mg 50 mL/hr over 60 Minutes Intravenous Every 24 hours 10/01/14 2105 10/02/14 2006   10/02/14 2200  Levofloxacin (LEVAQUIN) IVPB 250 mg  Status:  Discontinued     250 mg 50 mL/hr over 60 Minutes Intravenous Every 24 hours 10/02/14 1000 10/03/14 1454   10/02/14 0800  clindamycin (CLEOCIN) IVPB 900 mg     900 mg 100 mL/hr over 30 Minutes Intravenous To ShortStay Surgical 10/01/14 1238 10/02/14 0933   10/02/14 0800  gentamicin (GARAMYCIN) IVPB 100 mg     100 mg 200 mL/hr over 30 Minutes Intravenous To ShortStay Surgical 10/01/14 1238 10/02/14 0938   10/01/14 2200  levofloxacin (LEVAQUIN) IVPB 500 mg     500 mg 100 mL/hr over 60 Minutes Intravenous  Once 10/01/14 2059 10/02/14 0104      Assessment/Plan: s/p Procedure(s): Hartmann colectomy Stomal retraction and mucocutaneous separation, observation Abdomen benign.    LOS: 19 days    Epimenio Schetter T 10/18/2014

## 2014-10-19 LAB — BASIC METABOLIC PANEL
ANION GAP: 12 (ref 5–15)
BUN: 14 mg/dL (ref 6–20)
CHLORIDE: 98 mmol/L — AB (ref 101–111)
CO2: 34 mmol/L — ABNORMAL HIGH (ref 22–32)
CREATININE: 1.82 mg/dL — AB (ref 0.44–1.00)
Calcium: 8.4 mg/dL — ABNORMAL LOW (ref 8.9–10.3)
GFR calc Af Amer: 32 mL/min — ABNORMAL LOW (ref >60)
GFR calc non Af Amer: 28 mL/min — ABNORMAL LOW (ref >60)
Glucose, Bld: 93 mg/dL (ref 65–99)
POTASSIUM: 3.4 mmol/L — AB (ref 3.5–5.1)
SODIUM: 144 mmol/L (ref 135–145)

## 2014-10-19 LAB — CBC
HEMATOCRIT: 37 % (ref 36.0–46.0)
HEMOGLOBIN: 11.5 g/dL — AB (ref 12.0–15.0)
MCH: 29.8 pg (ref 26.0–34.0)
MCHC: 31.1 g/dL (ref 30.0–36.0)
MCV: 95.9 fL (ref 78.0–100.0)
Platelets: 239 10*3/uL (ref 150–400)
RBC: 3.86 MIL/uL — ABNORMAL LOW (ref 3.87–5.11)
RDW: 16.2 % — AB (ref 11.5–15.5)
WBC: 8.8 10*3/uL (ref 4.0–10.5)

## 2014-10-19 MED ORDER — TRAMADOL HCL 50 MG PO TABS
50.0000 mg | ORAL_TABLET | Freq: Two times a day (BID) | ORAL | Status: DC
  Administered 2014-10-19: 50 mg via ORAL
  Filled 2014-10-19 (×2): qty 1

## 2014-10-19 NOTE — Progress Notes (Signed)
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 Plan was for surgical intervention of fistula versus stricture with cardiac catheterization (after patient had failed Myoview ) done prior to surgery for clearance. Patient unable to get cardiac catheterization due to acute renal failure on day of catheterization. 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  Following surgery, patient with persistent leukocytosis and found to have UTI. Cardiology will be consulted after patient started having chest pain with mildly elevated troponin. On 6/13, patient's hemoglobin had dropped to as low as 7. She did not have an acute drop slowly trending down since admission.  She has also since developed postop ileus.  Patient's creatinine has remained stable. Patient underwent cardiac catheterization on 6/15 noting three-vessel disease although moderate. Only a very distal vessel noted up to 70% blockage. Plan is to manage this medically and if patient needs surgery, felt to be an acceptable cardiovascular risk.  After reviewing repeat CT scan, surgery is planned is to try to manage conservatively. Diet advanced and so far patient tolerating well. She continues to do this that she can possibly discharge early next week although this is separate from the possibly of whether or not she will need surgery long-term.  Patient continues to do well over the weekend. Ambulating. Tolerating by mouth. No complaints.  Assessment/Plan:    Tobacco user: Counseled. On nicotine patch.    COPD (chronic  obstructive pulmonary disease): Stable.    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  Following catheterization, stoma to be evaluated. After reviewing new CT, surgery wants to wait and observe to see if fistula will open up. Continue wound VAC. Seems to be doing well manage conservatively for now    CAD with mild non-STEMI and ischemia.: Three-vessel moderate disease. Stable  Acute on chronic diastolic heart failure: Diuresed well and is down to 132 pounds, suspected dry weight. Lasix stopped    GERD (gastroesophageal reflux disease)  Acute kidney injury in the setting of stage III chronic kidney disease: Felt to be secondary to recent CT scan done with contrast plus episodes of emesis plus poor oral intake. Renal ultrasound unrevealing. Creatinine has been trending up over the last few days, so we'll stop Lasix      E-coli UTI: Completed anti-biotic course  Code Status: Full code  Family Communication:  Left message with daughter  Disposition Plan: If continues to do well, return to skilled nursing on Monday.  Consultants:  Cardiology  General surgery  Procedures: Sigmoid colon colectomy with in colostomy takedown of colovaginal fistula per Dr. Brantley Stage 10/02/2014 Cardiac catheterization 6/15: Three-vessel moderate disease  Antibiotics:  IV Levaquin 10/01/2014 >>> 10/03/2014  IV Rocephin 10/03/2014>>>>> 10/07/2014  Oral Ceftin 10/07/2014 x 1 day   Objective: BP 112/42 mmHg  Pulse 63  Temp(Src) 98.8 F (37.1 C) (Oral)  Resp 18  Ht 5\' 1"  (1.549 m)  Wt 60.283 kg (132 lb 14.4 oz)  BMI 25.12 kg/m2  SpO2 95%  Intake/Output Summary (Last 24 hours) at 10/19/14 1230 Last data filed at 10/19/14 9562  Gross  per 24 hour  Intake    360 ml  Output    350 ml  Net     10 ml   Filed Weights   10/17/14 0605 10/18/14 0551 10/19/14 0436  Weight: 66.9 kg (147 lb 7.8 oz)  64.592 kg (142 lb 6.4 oz) 60.283 kg (132 lb 14.4 oz)    Exam:   General:  Resting comfortably  Cardiovascular: Regular rate and rhythm, M0-R7, 2/6 systolic ejection murmur  Respiratory: Clear to auscultation bilaterally  Abdomen: Soft, nontender, ostomy noted, some bowel sounds, wound VAC in place  Musculoskeletal: Trace edema   Data Reviewed: Basic Metabolic Panel:  Recent Labs Lab 10/15/14 0753 10/16/14 0530 10/17/14 0331 10/18/14 0410 10/19/14 0425  NA 146* 144 142 142 144  K 3.6 3.8 3.4* 3.4* 3.4*  CL 108 104 102 100* 98*  CO2 29 30 31 30  34*  GLUCOSE 99 113* 94 95 93  BUN 6 7 7 10 14   CREATININE 1.39* 1.22* 1.34* 1.53* 1.82*  CALCIUM 8.5* 8.2* 8.4* 8.2* 8.4*  MG  --  1.8  --   --   --    Liver Function Tests: No results for input(s): AST, ALT, ALKPHOS, BILITOT, PROT, ALBUMIN in the last 168 hours. No results for input(s): LIPASE, AMYLASE in the last 168 hours. No results for input(s): AMMONIA in the last 168 hours. CBC:  Recent Labs Lab 10/15/14 0753 10/16/14 0530 10/17/14 0331 10/18/14 0410 10/19/14 0425  WBC 11.0* 9.5 9.3 9.3 8.8  HGB 10.3* 10.0* 10.7* 10.7* 11.5*  HCT 33.5* 32.9* 34.6* 35.0* 37.0  MCV 95.4 96.2 96.9 96.2 95.9  PLT 294 264 278 234 239   Cardiac Enzymes:    Recent Labs Lab 10/12/14 2251 10/13/14 0505 10/13/14 1159 10/13/14 2107 10/14/14 0351  TROPONINI 0.04* 0.03 0.03 0.04* 0.05*   BNP (last 3 results) No results for input(s): BNP in the last 8760 hours.  ProBNP (last 3 results) No results for input(s): PROBNP in the last 8760 hours.  CBG: No results for input(s): GLUCAP in the last 168 hours.  No results found for this or any previous visit (from the past 240 hour(s)).   Studies: No results found.  Scheduled Meds: . amLODipine  10 mg Oral Daily  . aspirin  325 mg Oral Daily  . budesonide-formoterol  2 puff Inhalation BID  . docusate sodium  100 mg Oral BID  . feeding supplement (ENSURE ENLIVE)  237 mL Oral  BID BM  . furosemide  40 mg Oral BID  . heparin  5,000 Units Subcutaneous 3 times per day  . isosorbide mononitrate  30 mg Oral Daily  . lip balm  1 application Topical BID  . metoprolol tartrate  12.5 mg Oral BID  . nicotine  7 mg Transdermal Daily  . pantoprazole  40 mg Oral Daily  . pneumococcal 23 valent vaccine  0.5 mL Intramuscular Tomorrow-1000  . polyethylene glycol  17 g Oral Daily  . saccharomyces boulardii  250 mg Oral BID  . sucralfate  1 g Oral TID WC & HS  . tiotropium  18 mcg Inhalation Daily  . traMADol  50 mg Oral Q12H    Continuous Infusions:     Time spent: 15 minutes  [place] [facility] Pager [phone]. If 7PM-7AM, please contact night-coverage at [email or web address], password [facility] 10/19/2014, 12:30 PM  LOS: 20 days

## 2014-10-19 NOTE — Progress Notes (Signed)
Patient ID: Alexa Key, female   DOB: 03-11-1951, 64 y.o.   MRN: [id number] Patient ID: Alexa Key, female   DOB: 12/09/1950, 64 y.o.   MRN: [id number]  Subjective: No complaints this morning. She has noted that the oxycodone gives her nausea. Stoma functioning.  Objective: Vital signs in last 24 hours: Temp:  [98.6 F (37 C)-98.8 F (37.1 C)] 98.8 F (37.1 C) (06/19 0526) Pulse Rate:  [62-71] 63 (06/19 0526) Resp:  [18] 18 (06/19 0526) BP: (112-151)/(42-69) 112/42 mmHg (06/19 0526) SpO2:  [95 %-100 %] 95 % (06/19 0759) Weight:  [60.283 kg (132 lb 14.4 oz)] 60.283 kg (132 lb 14.4 oz) (06/19 0436) Last BM Date: 10/18/14  Intake/Output from previous day: 06/18 0701 - 06/19 0700 In: 480 [P.O.:480] Out: 425 [Urine:250; Drains:125; Stool:50] Intake/Output this shift:    General appearance: alert, cooperative and no distress GI: soft and nontender and nondistended. Stomal retraction and partial necrosis but some pink healthy tissue as well. Incision/Wound: VAC in place, no erythema or unusual drainage  Lab Results:   Recent Labs  10/18/14 0410 10/19/14 0425  WBC 9.3 8.8  HGB 10.7* 11.5*  HCT 35.0* 37.0  PLT 234 239   BMET  Recent Labs  10/18/14 0410 10/19/14 0425  NA 142 144  K 3.4* 3.4*  CL 100* 98*  CO2 30 34*  GLUCOSE 95 93  BUN 10 14  CREATININE 1.53* 1.82*  CALCIUM 8.2* 8.4*     Studies/Results: No results found.  Anti-infectives: Anti-infectives    Start     Dose/Rate Route Frequency Ordered Stop   10/07/14 1800  cefUROXime (CEFTIN) tablet 500 mg  Status:  Discontinued     500 mg Oral 2 times daily with meals 10/07/14 1203 10/11/14 1611   10/03/14 1530  cefTRIAXone (ROCEPHIN) 1 g in dextrose 5 % 50 mL IVPB - Premix  Status:  Discontinued     1 g 100 mL/hr over 30 Minutes Intravenous Every 24 hours 10/03/14 1454 10/07/14 1203   10/02/14 2200  Levofloxacin (LEVAQUIN) IVPB 250 mg  Status:  Discontinued     250 mg 50 mL/hr over 60 Minutes  Intravenous Every 24 hours 10/01/14 2105 10/02/14 2006   10/02/14 2200  Levofloxacin (LEVAQUIN) IVPB 250 mg  Status:  Discontinued     250 mg 50 mL/hr over 60 Minutes Intravenous Every 24 hours 10/02/14 1000 10/03/14 1454   10/02/14 0800  clindamycin (CLEOCIN) IVPB 900 mg     900 mg 100 mL/hr over 30 Minutes Intravenous To ShortStay Surgical 10/01/14 1238 10/02/14 0933   10/02/14 0800  gentamicin (GARAMYCIN) IVPB 100 mg     100 mg 200 mL/hr over 30 Minutes Intravenous To ShortStay Surgical 10/01/14 1238 10/02/14 0938   10/01/14 2200  levofloxacin (LEVAQUIN) IVPB 500 mg     500 mg 100 mL/hr over 60 Minutes Intravenous  Once 10/01/14 2059 10/02/14 0104      Assessment/Plan: s/p Procedure(s): Hartmann colectomy Stomal retraction and mucocutaneous separation, observation Abdomen benign. Change pain medication to tramadol due to nausea with oxycodone Anticipate discharge tomorrow   LOS: 20 days    Tayven Renteria T 10/19/2014

## 2014-10-20 LAB — BASIC METABOLIC PANEL
ANION GAP: 11 (ref 5–15)
BUN: 19 mg/dL (ref 6–20)
CO2: 33 mmol/L — AB (ref 22–32)
CREATININE: 1.95 mg/dL — AB (ref 0.44–1.00)
Calcium: 8.3 mg/dL — ABNORMAL LOW (ref 8.9–10.3)
Chloride: 97 mmol/L — ABNORMAL LOW (ref 101–111)
GFR calc Af Amer: 30 mL/min — ABNORMAL LOW (ref >60)
GFR, EST NON AFRICAN AMERICAN: 26 mL/min — AB (ref >60)
Glucose, Bld: 110 mg/dL — ABNORMAL HIGH (ref 65–99)
Potassium: 3 mmol/L — ABNORMAL LOW (ref 3.5–5.1)
Sodium: 141 mmol/L (ref 135–145)

## 2014-10-20 LAB — CBC
HEMATOCRIT: 35 % — AB (ref 36.0–46.0)
HEMOGLOBIN: 10.8 g/dL — AB (ref 12.0–15.0)
MCH: 29.3 pg (ref 26.0–34.0)
MCHC: 30.9 g/dL (ref 30.0–36.0)
MCV: 95.1 fL (ref 78.0–100.0)
Platelets: 237 10*3/uL (ref 150–400)
RBC: 3.68 MIL/uL — ABNORMAL LOW (ref 3.87–5.11)
RDW: 16.1 % — AB (ref 11.5–15.5)
WBC: 8.5 10*3/uL (ref 4.0–10.5)

## 2014-10-20 MED ORDER — SODIUM CHLORIDE 0.9 % IV SOLN
INTRAVENOUS | Status: DC
  Administered 2014-10-20: 12:00:00 via INTRAVENOUS

## 2014-10-20 NOTE — Consult Note (Addendum)
WOC ostomy follow up CCS following for assessment and plan of care to abd wound; PA  at the bedside to assess the stoma. Bedside nurses are changing abd Vac dressing Q Tues/Thues/Sat. Stoma type/location: Colostomy to LLQ from 6/2 Stomal assessment/size: Stoma 50% loose slough, 50% red, flush with skin level, 1 1/2 inches. Mucocutaneous has occured at some edges. Pt has less pain at stoma site then previous week.  Output 75 cc semi-formed brown stool. Ostomy pouching: 1pc with barrier ring to maintain seal.  Education provided: Demonstrated pouch change using one piece with barrier ring to maintain seal. Pt assisted with pouch application and was able to open and close velcro to empty.  She plans to go to a SNF where she will have assistance with pouching activities after discharge. Enrolled patient in [place] [facility] Discharge program: Yes Educational materials and extra supplies left at bedside for staff nurse use. Reviewed ordering supplies after discharge and pt denies further questions. Julien Girt MSN, RN, [place], [place], [place]

## 2014-10-20 NOTE — Consult Note (Signed)
   [facility] CM Inpatient Consult   10/20/2014  Alexa Key [birth year] [id number] Patient evaluated for community based chronic disease management services with [facility] as a benefit of patient's [employer] Insurance. Met with the  patient at bedside to explain [facility] services. Patient endorses that she may need some rehab prior to returning home.  She states she has a supportive daughter but her daughter works. Consent form signed and folder with information reviewed.   Patient will receive post discharge  calls and will be evaluated for monthly home visits for assessments and disease process education.  Left contact information and [facility] literature at bedside. Made Inpatient Case Manager aware that [facility] Management following. Of note, [facility] Care Management services does not replace or interfere with any services that are arranged by inpatient case management or social work.  For additional questions or referrals please contact:   Natividad Brood, RN BSN [facility] Liaison  [phone] business mobile phone

## 2014-10-20 NOTE — Discharge Instructions (Signed)
Ostomy Support Information  Yes, Theresia Majors heard that people get along just fine with only one of their eyes, or one of their lungs, or one of their kidneys. But you also know that you have only one intestine and only one bladder, and that leaves you feeling awfully empty, both physically and emotionally: You think no other people go around without part of their intestine with the ends of their intestines sticking out through their abdominal walls.  Well, you are wrong! There are nearly three quarters of a million people in the [place] who have an ostomy; people who have had surgery to remove all or part of their colons or bladders. There is even a national association, the [place] [employer] [place] with over 350 local affiliated support groups that are organized by volunteers who provide peer support and counseling. Juan Quam has a toll free telephone num-ber, ([phone] and an educational,  interactive website, www.ostomy.org   An ostomy is an opening in the belly (abdominal wall) made by surgery. Ostomates are people who have had this procedure. The opening (stoma) allows the kidney or bowel to discharge waste. An external pouch covers the stoma to collect waste. Pouches are are a simple bag and are odor free. Different companies have disposable or reusable pouches to fit one's lifestyle. An ostomy can either be temporary or permanent.  THERE ARE THREE MAIN TYPES OF OSTOMIES  Colostomy. A colostomy is a surgically created opening in the large intestine (colon).  Ileostomy. An ileostomy is a surgically created opening in the small intestine.  Urostomy. A urostomy is a surgically created opening to divert urine away from the bladder. FREQUENTLY ASKED QUESTIONS   Why havent you met any of these folks who have an ostomy?  Well, maybe you have! You just did not recognize them because an ostomy doesn't show. It can be kept secret if you wish. Why, maybe some of your best friends, office associates  or neighbors have an ostomy ... you never can tell.   People facing ostomy surgery have many quality-of-life questions like:  Will you bulge? Smell? Make noises? Will you feel waste leaving your body? Will you be a captive of the toilet? Will you starve? Be a social outcast? Get/stay married? Have babies? Easily bathe, go swimming, bend over?  OK, lets look at what you can expect:  Will you bulge?  Remember, without part of the intestine or bladder, and its contents, you should have a flatter tummy than before. You can expect to wear, with little exception, what you wore before surgery ... and this in-cludes tight clothing and bathing suits.  Will you smell?  Today, thanks to modern odor proof pouching systems, you can walk into an ostomy support group meeting and not smell anything that is foul or offensive. And, for those with an ileostomy or colostomy who are concerned about odor when emptying their pouch, there are in-pouch deodorants that can be used to eliminate any waste odors that may exist.  Will you make noises?  Everyone produces gas, especially if they are an air-swallower. But intestinal sounds that occur from time to time are no differ-ent than a gurgling tummy, and quite often your clothing will muffle any sounds.   Will you feel the waste discharges?  For those with a colostomy or ileostomy there might be a slight pressure when waste leaves your body, but understand that the intestines have no nerve endings, so there will be no unpleasant sensations. Those with a urostomy will  probably be unaware of any kidney drainage.  Will you be a captive of the toilet?  Immediately post-op you will spend more time in the bathroom than you will after your body recovers from surgery. Every person is different, but on average those with an ileostomy or urostomy may empty their pouches 4 to 6 times a day; a little  less if you have a colostomy. The average wear time between pouch system changes is 3  to 5 days and the changing process should take less than 30 minutes.  Will I need to be on a special diet? Most people return to their normal diet when they have recovered from surgery. Be sure to chew your food well, eat a well-balanced diet and drink plenty of fluids. If you experience problems with a certain food, wait a couple of weeks and try it again. Will there be odor and noises? Pouching systems are designed to be odor-proof or odor-resistant. There are deodorants that can be used in the pouch. Medications are also available to help reduce odor. Limit gas-producing foods and carbonated beverages. You will experience less gas and fewer noises as you heal from surgery. How much time will it take to care for my ostomy? At first, you may spend a lot of time learning about your ostomy and how to take care of it. As you become more comfortable and skilled at changing the pouching system, it will take very little time to care for it.  Will I be able to return to work? People with ostomies can perform most jobs. As soon as you have healed from surgery, you should be able to return to work. Heavy lifting (more than 10 pounds) may be discouraged.  What about intimacy? Sexual relationships and intimacy are important and fulfilling aspects of your life. They should continue after ostomy surgery. Intimacy-related concerns should be discussed openly between you and your partner.  Can I wear regular clothing? You do not need to wear special clothing. Ostomy pouches are fairly flat and barely noticeable. Elastic undergarments will not hurt the stoma or prevent the ostomy from functioning.  Can I participate in sports? An ostomy should not limit your involvement in sports. Many people with ostomies are runners, skiers, swimmers or participate in other active lifestyles. Talk with your caregiver first before doing heavy physical activity.  Will you starve?  Not if you follow doctors orders at each stage of  your post-op adjustment. There is no such thing as an ostomy diet. Some people with an ostomy will be able to eat and tolerate anything; others may find diffi-culty with some foods. Each person is an individual and must determine, by trial, what is best for them. A good practice for all is to drink plenty of water.  Will you be a social outcast?  Have you met anyone who has an ostomy and is a social outcast? Why should you be the first? Only your attitude and self image will effect how you are treated. No confi-dent person is an Occupational psychologist.   PROFESSIONAL HELP  Resources are available if you need help or have questions about your ostomy.    Specially trained nurses called Wound, Ostomy Continence Nurses (WOCN) are available for consultation in most major medical centers.   Consider getting an ostomy consult with Cena Benton at [facility] to help troubleshoot stoma pouch fittings and other issues with your ostomy: [phone]   The [place] (UOA) is a group made up of many  local chapters throughout the [place]. These local groups hold meetings and provide support to prospective and existing ostomates. They sponsor educational events and have qualified visitors to make personal or telephone visits. Contact the UOA for the chapter nearest you and for other educational publications.  More detailed information can be found in Colostomy Guide, a publication of the [employer] (UOA). Contact UOA at 1-251-662-0898 or visit their web site at [email or web address] The website contains links to other sites, suppliers and resources. Document Released: 04/21/2003 Document Revised: 07/11/2011 Document Reviewed: 08/20/2008 [facility] Patient Information 2013 [place].  GETTING TO GOOD BOWEL HEALTH. Irregular bowel habits such as constipation and diarrhea can lead to many problems over time.  Having one soft bowel movement a day is the most important way to  prevent further problems.  The anorectal canal is designed to handle stretching and feces to safely manage our ability to get rid of solid waste (feces, poop, stool) out of our body.  BUT, hard constipated stools can act like ripping concrete bricks and diarrhea can be a burning fire to this very sensitive area of our body, causing inflamed hemorrhoids, anal fissures, increasing risk is perirectal abscesses, abdominal pain/bloating, an making irritable bowel worse.      The goal: ONE SOFT BOWEL MOVEMENT A DAY!  To have soft, regular bowel movements:   Drink plenty of fluids, consider 4-6 tall glasses of water a day.    Take plenty of fiber.  Fiber is the undigested part of plant food that passes into the colon, acting s natures broom to encourage bowel motility and movement.  Fiber can absorb and hold large amounts of water. This results in a larger, bulkier stool, which is soft and easier to pass. Work gradually over several weeks up to 6 servings a day of fiber (25g a day even more if needed) in the form of: o Vegetables -- Root (potatoes, carrots, turnips), leafy green (lettuce, salad greens, celery, spinach), or cooked high residue (cabbage, broccoli, etc) o Fruit -- Fresh (unpeeled skin & pulp), Dried (prunes, apricots, cherries, etc ),  or stewed ( applesauce)  o Whole grain breads, pasta, etc (whole wheat)  o Bran cereals   Bulking Agents -- This type of water-retaining fiber generally is easily obtained each day by one of the following:  o Psyllium bran -- The psyllium plant is remarkable because its ground seeds can retain so much water. This product is available as Metamucil, Konsyl, Effersyllium, Per Diem Fiber, or the less expensive generic preparation in drug and health food stores. Although labeled a laxative, it really is not a laxative.  o Methylcellulose -- This is another fiber derived from wood which also retains water. It is available as Citrucel. o Polyethylene Glycol - and  artificial fiber commonly called Miralax or Glycolax.  It is helpful for people with gassy or bloated feelings with regular fiber o Flax Seed - a less gassy fiber than psyllium  No reading or other relaxing activity while on the toilet. If bowel movements take longer than 5 minutes, you are too constipated  AVOID CONSTIPATION.  High fiber and water intake usually takes care of this.  Sometimes a laxative is needed to stimulate more frequent bowel movements, but   Laxatives are not a good long-term solution as it can wear the colon out.  They can help jump-start bowels if constipated, but should be relied on constantly without discussing with your doctor o Osmotics (Milk of Magnesia, Fleets phosphosoda, Magnesium  citrate, MiraLax, GoLytely) are safer than  o Stimulants (Senokot, Castor Oil, Dulcolax, Ex Lax)    o Avoid taking laxatives for more than 7 days in a row.   IF SEVERELY CONSTIPATED, try a Bowel Retraining Program: o Do not use laxatives.  o Eat a diet high in roughage, such as bran cereals and leafy vegetables.  o Drink six (6) ounces of prune or apricot juice each morning.  o Eat two (2) large servings of stewed fruit each day.  o Take one (1) heaping tablespoon of a psyllium-based bulking agent twice a day. Use sugar-free sweetener when possible to avoid excessive calories.  o Eat a normal breakfast.  o Set aside 15 minutes after breakfast to sit on the toilet, but do not strain to have a bowel movement.  o If you do not have a bowel movement by the third day, use an enema and repeat the above steps.   Controlling diarrhea o Switch to liquids and simpler foods for a few days to avoid stressing your intestines further. o Avoid dairy products (especially milk & ice cream) for a short time.  The intestines often can lose the ability to digest lactose when stressed. o Avoid foods that cause gassiness or bloating.  Typical foods include beans and other legumes, cabbage, broccoli, and  dairy foods.  Every person has some sensitivity to other foods, so listen to our body and avoid those foods that trigger problems for you. o Adding fiber (Citrucel, Metamucil, psyllium, Miralax) gradually can help thicken stools by absorbing excess fluid and retrain the intestines to act more normally.  Slowly increase the dose over a few weeks.  Too much fiber too soon can backfire and cause cramping & bloating. o Probiotics (such as active yogurt, Align, etc) may help repopulate the intestines and colon with normal bacteria and calm down a sensitive digestive tract.  Most studies show it to be of mild help, though, and such products can be costly. o Medicines: - Bismuth subsalicylate (ex. Kayopectate, Pepto Bismol) every 30 minutes for up to 6 doses can help control diarrhea.  Avoid if pregnant. - Loperamide (Immodium) can slow down diarrhea.  Start with two tablets (84m total) first and then try one tablet every 6 hours.  Avoid if you are having fevers or severe pain.  If you are not better or start feeling worse, stop all medicines and call your doctor for advice o Call your doctor if you are getting worse or not better.  Sometimes further testing (cultures, endoscopy, X-ray studies, bloodwork, etc) may be needed to help diagnose and treat the cause of the diarrhea.  TROUBLESHOOTING IRREGULAR BOWELS 1) Avoid extremes of bowel movements (no bad constipation/diarrhea) 2) Miralax 17gm mixed in 8oz. water or juice-daily. May use BID as needed.  3) Gas-x,Phazyme, etc. as needed for gas & bloating.  4) Soft,bland diet. No spicy,greasy,fried foods.  5) Prilosec over-the-counter as needed  6) May hold gluten/wheat products from diet to see if symptoms improve.  7)  May try probiotics (Align, Activa, etc) to help calm the bowels down 7) If symptoms become worse call back immediately.  Diverticulitis Diverticulitis is inflammation or infection of small pouches in your colon that form when you have a  condition called diverticulosis. The pouches in your colon are called diverticula. Your colon, or large intestine, is where water is absorbed and stool is formed. Complications of diverticulitis can include:  Bleeding.  Severe infection.  Severe pain.  Perforation of your colon.  Obstruction of your colon. CAUSES  Diverticulitis is caused by bacteria. Diverticulitis happens when stool becomes trapped in diverticula. This allows bacteria to grow in the diverticula, which can lead to inflammation and infection. RISK FACTORS People with diverticulosis are at risk for diverticulitis. Eating a diet that does not include enough fiber from fruits and vegetables may make diverticulitis more likely to develop. SYMPTOMS  Symptoms of diverticulitis may include:  Abdominal pain and tenderness. The pain is normally located on the left side of the abdomen, but may occur in other areas.  Fever and chills.  Bloating.  Cramping.  Nausea.  Vomiting.  Constipation.  Diarrhea.  Blood in your stool. DIAGNOSIS  Your health care provider will ask you about your medical history and do a physical exam. You may need to have tests done because many medical conditions can cause the same symptoms as diverticulitis. Tests may include:  Blood tests.  Urine tests.  Imaging tests of the abdomen, including X-rays and CT scans. When your condition is under control, your health care provider may recommend that you have a colonoscopy. A colonoscopy can show how severe your diverticula are and whether something else is causing your symptoms. TREATMENT  Most cases of diverticulitis are mild and can be treated at home. Treatment may include:  Taking over-the-counter pain medicines.  Following a clear liquid diet.  Taking antibiotic medicines by mouth for 7-10 days. More severe cases may be treated at a hospital. Treatment may include:  Not eating or drinking.  Taking prescription pain  medicine.  Receiving antibiotic medicines through an IV tube.  Receiving fluids and nutrition through an IV tube.  Surgery. HOME CARE INSTRUCTIONS   Follow your health care provider's instructions carefully.  Follow a full liquid diet or other diet as directed by your health care provider. After your symptoms improve, your health care provider may tell you to change your diet. He or she may recommend you eat a high-fiber diet. Fruits and vegetables are good sources of fiber. Fiber makes it easier to pass stool.  Take fiber supplements or probiotics as directed by your health care provider.  Only take medicines as directed by your health care provider.  Keep all your follow-up appointments. SEEK MEDICAL CARE IF:   Your pain does not improve.  You have a hard time eating food.  Your bowel movements do not return to normal. SEEK IMMEDIATE MEDICAL CARE IF:   Your pain becomes worse.  Your symptoms do not get better.  Your symptoms suddenly get worse.  You have a fever.  You have repeated vomiting.  You have bloody or black, tarry stools. MAKE SURE YOU:   Understand these instructions.  Will watch your condition.  Will get help right away if you are not doing well or get worse. Document Released: 01/26/2005 Document Revised: 04/23/2013 Document Reviewed: 03/13/2013 [facility] Patient Information 2015 [place], [place]. This information is not intended to replace advice given to you by your health care provider. Make sure you discuss any questions you have with your health care provider.   [facility], [place] [phone]  OPEN ABDOMINAL SURGERY: POST OP INSTRUCTIONS  Always review your discharge instruction sheet given to you by the facility where your surgery was performed.  IF YOU HAVE DISABILITY OR FAMILY LEAVE FORMS, YOU MUST BRING THEM TO THE OFFICE FOR PROCESSING.  PLEASE DO NOT GIVE THEM TO YOUR DOCTOR.  1. A prescription for pain medication  may be given  to you upon discharge.  Take your pain medication as prescribed, if needed.  If narcotic pain medicine is not needed, then you may take acetaminophen (Tylenol) or ibuprofen (Advil) as needed. 2. Take your usually prescribed medications unless otherwise directed. 3. If you need a refill on your pain medication, please contact your pharmacy. They will contact our office to request authorization.  Prescriptions will not be filled after 5pm or on week-ends. 4. You should follow a light diet the first few days after arrival home, such as soup and crackers, pudding, etc.unless your doctor has advised otherwise. A high-fiber, low fat diet can be resumed as tolerated.   Be sure to include lots of fluids daily. Most patients will experience some swelling and bruising on the chest and neck area.  Ice packs will help.  Swelling and bruising can take several days to resolve 5. Most patients will experience some swelling and bruising in the area of the incision. Ice pack will help. Swelling and bruising can take several days to resolve..  6. It is common to experience some constipation if taking pain medication after surgery.  Increasing fluid intake and taking a stool softener will usually help or prevent this problem from occurring.  A mild laxative (Milk of Magnesia or Miralax) should be taken according to package directions if there are no bowel movements after 48 hours. 7.  You may have steri-strips (small skin tapes) in place directly over the incision.  These strips should be left on the skin for 7-10 days.  If your surgeon used skin glue on the incision, you may shower in 24 hours.  The glue will flake off over the next 2-3 weeks.  Any sutures or staples will be removed at the office during your follow-up visit. You may find that a light gauze bandage over your incision may keep your staples from being rubbed or pulled. You may shower and replace the bandage daily. 8. ACTIVITIES:  You may resume  regular (light) daily activities beginning the next day--such as daily self-care, walking, climbing stairs--gradually increasing activities as tolerated.  You may have sexual intercourse when it is comfortable.  Refrain from any heavy lifting or straining until approved by your doctor. a. You may drive when you no longer are taking prescription pain medication, you can comfortably wear a seatbelt, and you can safely maneuver your car and apply brakes b. Return to Work: ___________________________________ 36. You should see your doctor in the office for a follow-up appointment approximately two weeks after your surgery.  Make sure that you call for this appointment within a day or two after you arrive home to insure a convenient appointment time. OTHER INSTRUCTIONS:  _____________________________________________________________ _____________________________________________________________  WHEN TO CALL YOUR DOCTOR: 1. Fever over 101.0 2. Inability to urinate 3. Nausea and/or vomiting 4. Extreme swelling or bruising 5. Continued bleeding from incision. 6. Increased pain, redness, or drainage from the incision. 7. Difficulty swallowing or breathing 8. Muscle cramping or spasms. 9. Numbness or tingling in hands or feet or around lips.  The clinic staff is available to answer your questions during regular business hours.  Please dont hesitate to call and ask to speak to one of the nurses if you have concerns.  For further questions, please visit [email or web address]

## 2014-10-20 NOTE — Progress Notes (Signed)
PT Cancellation Note  Patient Details Name: Alexa Key MRN: [id number] DOB: Nov 11, 1950   Cancelled Treatment:    Reason Eval/Treat Not Completed: Other (comment)  Pt stating she just doesn't feel like it.  Will check back another time.     Adraine Biffle, Thornton Papas 10/20/2014, 11:15 AM

## 2014-10-20 NOTE — Clinical Social Work Note (Signed)
CSW spoke with [facility] and patient plan is to discharge to [facility].  CSW was asked by physician to make sure facility can take patients ostomy and wound vac, CSW confirmed with SNF that they can still meet her needs.  Per physician, patient may be ready for discharge tomorrow, CSW to continue to follow patient and facilitate discharge planning.  Jones Broom. [place], MSW, [place] 10/20/2014 4:52 PM

## 2014-10-20 NOTE — Progress Notes (Signed)
[facility] Progress Note  5 Days Post-Op  Subjective: Pt has felt much better since Thursday when I saw her last.  Much less pain at her ostomy and incision.  Stool output has picked up along with flatus.  Not much nausea.  Her appetite is quickly improving.  WOC at bedside doing one more teaching with her.     Objective: Vital signs in last 24 hours: Temp:  [98.3 F (36.8 C)-98.6 F (37 C)] 98.6 F (37 C) (06/20 0646) Pulse Rate:  [59-67] 67 (06/20 0646) Resp:  [16-18] 16 (06/20 0646) BP: (111-146)/(42-54) 146/51 mmHg (06/20 0646) SpO2:  [96 %-98 %] 97 % (06/20 0646) Weight:  [60.464 kg (133 lb 4.8 oz)] 60.464 kg (133 lb 4.8 oz) (06/20 0646) Last BM Date: 10/19/14  Intake/Output from previous day: 06/19 0701 - 06/20 0700 In: 360 [P.O.:360] Out: 825 [Urine:625; Drains:200] Intake/Output this shift:    PE: Gen: Alert, NAD, pleasant Abd: Soft, ND,much less tender around ostomy and incision than on Thursday, most ender spot is skin between ostomy and incision site, +BS, no HSM, midline wound with wound vac in place with purulent sanguinous drainage, Ostomy has significant mucocutaneous separation but has sloughed some of its necrotic tissue away and now some pink tissue present, yellow stool output found in ostomy bag. VAC canister with tan-ish red drainage which filled the canister 3/4 of the way full since Thursday (223mL in last 24 hours).    10/16/14   10/20/14   Lab Results:   Recent Labs  10/19/14 0425 10/20/14 0744  WBC 8.8 8.5  HGB 11.5* 10.8*  HCT 37.0 35.0*  PLT 239 237   BMET  Recent Labs  10/19/14 0425 10/20/14 0744  NA 144 141  K 3.4* 3.0*  CL 98* 97*  CO2 34* 33*  GLUCOSE 93 110*  BUN 14 19  CREATININE 1.82* 1.95*  CALCIUM 8.4* 8.3*   PT/INR No results for input(s): LABPROT, INR in the last 72 hours. CMP     Component Value Date/Time   NA 141 10/20/2014 0744   NA 145* 01/17/2013 1703   K 3.0* 10/20/2014 0744   CL 97*  10/20/2014 0744   CO2 33* 10/20/2014 0744   GLUCOSE 110* 10/20/2014 0744   GLUCOSE 82 01/17/2013 1703   BUN 19 10/20/2014 0744   BUN 26 01/17/2013 1703   CREATININE 1.95* 10/20/2014 0744   CREATININE 1.69* 11/05/2012 1541   CALCIUM 8.3* 10/20/2014 0744   PROT 7.2 09/29/2014 1107   PROT 6.5 01/17/2013 1703   ALBUMIN 2.2* 10/03/2014 0309   AST 22 09/29/2014 1107   ALT 18 09/29/2014 1107   ALKPHOS 77 09/29/2014 1107   BILITOT 0.3 09/29/2014 1107   GFRNONAA 26* 10/20/2014 0744   GFRNONAA 32* 11/05/2012 1541   GFRAA 30* 10/20/2014 0744   GFRAA 36* 11/05/2012 1541   Lipase     Component Value Date/Time   LIPASE 29 09/29/2014 1108       Studies/Results: No results found.  Anti-infectives: Anti-infectives    Start     Dose/Rate Route Frequency Ordered Stop   10/07/14 1800  cefUROXime (CEFTIN) tablet 500 mg  Status:  Discontinued     500 mg Oral 2 times daily with meals 10/07/14 1203 10/11/14 1611   10/03/14 1530  cefTRIAXone (ROCEPHIN) 1 g in dextrose 5 % 50 mL IVPB - Premix  Status:  Discontinued     1 g 100 mL/hr over 30 Minutes Intravenous Every 24 hours 10/03/14 1454 10/07/14  1203   10/02/14 2200  Levofloxacin (LEVAQUIN) IVPB 250 mg  Status:  Discontinued     250 mg 50 mL/hr over 60 Minutes Intravenous Every 24 hours 10/01/14 2105 10/02/14 2006   10/02/14 2200  Levofloxacin (LEVAQUIN) IVPB 250 mg  Status:  Discontinued     250 mg 50 mL/hr over 60 Minutes Intravenous Every 24 hours 10/02/14 1000 10/03/14 1454   10/02/14 0800  clindamycin (CLEOCIN) IVPB 900 mg     900 mg 100 mL/hr over 30 Minutes Intravenous To [facility] 10/01/14 1238 10/02/14 0933   10/02/14 0800  gentamicin (GARAMYCIN) IVPB 100 mg     100 mg 200 mL/hr over 30 Minutes Intravenous To [facility] 10/01/14 1238 10/02/14 0938   10/01/14 2200  levofloxacin (LEVAQUIN) IVPB 500 mg     500 mg 100 mL/hr over 60 Minutes Intravenous  Once 10/01/14 2059 10/02/14 0104        Assessment/Plan Large bowel obstruction secondary to sigmoid colon stricture and colovaginal fistula  POD #18 exploratory laparotomy with sigmoid colectomy, colovaginal fistula takedown and colostomy--Dr. Brantley Stage -10/13/14 - CT abd/pelvis showed normal post-op changes, dilated loops of small bowel, no acute findings or drainable abscesses -10/16/14 - CT abd/pelvis showed extraadominal connection between area of infection near colostomy -Ileus seems resolved, no more nausea, and good colostomy output -HH diet -PT -Fever/wbc normalized Post-operative wound dehiscence -Continue wound vac Tu/Th/Sa, can switch to M/W/F at SNF Colostomy mucocutaneous separation/necrosis -Continue with ostomy care, as long as it continues to improve and output continues will not need further surgery.  It would be very difficult to revise this at this time.    ID- ceftin for UTI - completed. VTE prophylaxis-SCD/heparin NSTEMI - cardiology following, cath recommended medical management only Pulmonary-hypoxia has resolved, but still requiring O2 FEN-soft, on orals and IV prn pain meds Disp-When medically stable can d/c to SNF,  Will need careful monitoring of colostomy output (amount and quality) as well as wound vac output (amount and quality).  These numbers should be recorded daily.  She will need continued teaching regarding ostomy management.  Wound vac can be changed to MWF for convenience if needed, but is currently Tu/Th/Sa.  Continue with HH diet.   Will schedule follow up with Dr. Brantley Stage 10/28/14 appt 3:20pm for a close follow up given wound status.  Would have her continue stool softeners, miralax and probiotic.    LOS: 21 days    Nat Christen 10/20/2014, 9:31 AM Pager: [phone]

## 2014-10-20 NOTE — Progress Notes (Signed)
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 Plan was for surgical intervention of fistula versus stricture with cardiac catheterization (after patient had failed Myoview ) done prior to surgery for clearance. Patient unable to get cardiac catheterization due to acute renal failure on day of catheterization. 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  Following surgery, patient with persistent leukocytosis and found to have UTI. Cardiology will be consulted after patient started having chest pain with mildly elevated troponin. On 6/13, patient's hemoglobin had dropped to as low as 7. She did not have an acute drop slowly trending down since admission.  She has also since developed postop ileus.  Patient's creatinine has remained stable. Patient underwent cardiac catheterization on 6/15 noting three-vessel disease although moderate. Only a very distal vessel noted up to 70% blockage. Plan is to manage this medically and if patient needs surgery, felt to be an acceptable cardiovascular risk.  After reviewing repeat CT scan, surgery is planned is to try to manage conservatively. Diet advanced and so far patient tolerating well. She continues to do this that she can possibly discharge early next week although this is separate from the possibly of whether or not she will need surgery long-term.  For several days, patient has been tolerating by mouth without abdominal pain, ambulating. Creatinine has been slowly trending up even after stopping Lasix. Gentle IV fluids added today.  Patient herself has no complaints  Assessment/Plan:    Tobacco user: Counseled. On nicotine patch.    COPD (chronic obstructive pulmonary disease): Stable.    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  Following catheterization, stoma to be evaluated. After reviewing new CT, surgery wants to wait and observe to see if fistula will open up. Continue wound VAC. Seems to be doing well manage conservatively for now    CAD with mild non-STEMI and ischemia.: Three-vessel moderate disease. Stable  Acute on chronic diastolic heart failure: Diuresed well and is down to 132 pounds, suspected dry weight. Lasix stopped. Patient slightly over diuresed so gentle IV fluids    GERD (gastroesophageal reflux disease)  Acute kidney injury in the setting of stage III chronic kidney disease: Felt to be secondary to recent CT scan done with contrast plus episodes of emesis plus poor oral intake. Renal ultrasound unrevealing. Creatinine has been trending up over the last few days, so Lasix stopped. Creatinine still try to reach ending up, so have added gentle IV fluids today      E-coli UTI: Completed anti-biotic course  Code Status: Full code  Family Communication:  Left message with daughter  Disposition Plan: Skilled nursing Tuesday  Consultants:  Cardiology  General surgery  Procedures: Sigmoid colon colectomy with in colostomy takedown of colovaginal fistula per Dr. Brantley Stage 10/02/2014 Cardiac catheterization 6/15: Three-vessel moderate disease  Antibiotics:  IV Levaquin 10/01/2014 >>> 10/03/2014  IV Rocephin 10/03/2014>>>>> 10/07/2014  Oral Ceftin 10/07/2014 x 1 day   Objective: BP 126/50 mmHg  Pulse 60  Temp(Src) 98.7 F (37.1 C) (Oral)  Resp 16  Ht 5\' 1"  (1.549 m)  Wt 60.464 kg (133 lb 4.8 oz)  BMI 25.20 kg/m2  SpO2 97%  Intake/Output Summary (Last 24 hours) at  10/20/14 1517 Last data filed at 10/20/14 1300  Gross per 24 hour  Intake    600 ml  Output    665 ml  Net    -65 ml   Filed Weights   10/18/14 0551 10/19/14 0436 10/20/14 0646  Weight: 64.592 kg (142 lb 6.4 oz) 60.283 kg (132 lb 14.4 oz) 60.464 kg (133 lb 4.8 oz)    Exam:   General:  Alert and oriented, no acute distress  Cardiovascular: Regular rate and rhythm, O9-G2, 2/6 systolic ejection murmur  Respiratory: Clear to auscultation bilaterally  Abdomen: Soft, nontender, ostomy noted, some bowel sounds, wound VAC in place  Musculoskeletal: No edema  Data Reviewed: Basic Metabolic Panel:  Recent Labs Lab 10/16/14 0530 10/17/14 0331 10/18/14 0410 10/19/14 0425 10/20/14 0744  NA 144 142 142 144 141  K 3.8 3.4* 3.4* 3.4* 3.0*  CL 104 102 100* 98* 97*  CO2 30 31 30  34* 33*  GLUCOSE 113* 94 95 93 110*  BUN 7 7 10 14 19   CREATININE 1.22* 1.34* 1.53* 1.82* 1.95*  CALCIUM 8.2* 8.4* 8.2* 8.4* 8.3*  MG 1.8  --   --   --   --    Liver Function Tests: No results for input(s): AST, ALT, ALKPHOS, BILITOT, PROT, ALBUMIN in the last 168 hours. No results for input(s): LIPASE, AMYLASE in the last 168 hours. No results for input(s): AMMONIA in the last 168 hours. CBC:  Recent Labs Lab 10/16/14 0530 10/17/14 0331 10/18/14 0410 10/19/14 0425 10/20/14 0744  WBC 9.5 9.3 9.3 8.8 8.5  HGB 10.0* 10.7* 10.7* 11.5* 10.8*  HCT 32.9* 34.6* 35.0* 37.0 35.0*  MCV 96.2 96.9 96.2 95.9 95.1  PLT 264 278 234 239 237   Cardiac Enzymes:    Recent Labs Lab 10/13/14 2107 10/14/14 0351  TROPONINI 0.04* 0.05*   BNP (last 3 results) No results for input(s): BNP in the last 8760 hours.  ProBNP (last 3 results) No results for input(s): PROBNP in the last 8760 hours.  CBG: No results for input(s): GLUCAP in the last 168 hours.  No results found for this or any previous visit (from the past 240 hour(s)).   Studies: No results found.  Scheduled Meds: . amLODipine  10 mg Oral  Daily  . aspirin  325 mg Oral Daily  . budesonide-formoterol  2 puff Inhalation BID  . docusate sodium  100 mg Oral BID  . feeding supplement (ENSURE ENLIVE)  237 mL Oral BID BM  . heparin  5,000 Units Subcutaneous 3 times per day  . isosorbide mononitrate  30 mg Oral Daily  . lip balm  1 application Topical BID  . metoprolol tartrate  12.5 mg Oral BID  . nicotine  7 mg Transdermal Daily  . pantoprazole  40 mg Oral Daily  . pneumococcal 23 valent vaccine  0.5 mL Intramuscular Tomorrow-1000  . polyethylene glycol  17 g Oral Daily  . saccharomyces boulardii  250 mg Oral BID  . sucralfate  1 g Oral TID WC & HS  . tiotropium  18 mcg Inhalation Daily  . traMADol  50 mg Oral Q12H    Continuous Infusions: . sodium chloride 75 mL/hr at 10/20/14 1155     Time spent: 15 minutes  [facility] Pager [phone]. If 7PM-7AM, please contact night-coverage at [email or web address], password [facility] 10/20/2014, 3:17 PM  LOS: 21 days

## 2014-10-20 NOTE — Care Management Note (Signed)
Case Management Note  Patient Details  Name: Alexa Key MRN: [id number] Date of Birth: 10-20-1950  Subjective/Objective:                    Action/Plan:  UR updated  Expected Discharge Date:     10-21-14             Expected Discharge Plan:  [place]  In-House Referral:  Clinical Social Work  Discharge planning Services  CM Consult  Post Acute Care Choice:    Choice offered to:     DME Arranged:    DME Agency:     HH Arranged:    [place] Agency:     Status of Service:  In process, will continue to follow  Medicare Important Message Given:  Yes Date Medicare IM Given:  10/06/14 Medicare IM give by:  Pearla Dubonnet RN Date Additional Medicare IM Given:  10/13/14 Additional Medicare Important Message give by:  Ellan Lambert, RN, BSN   If discussed at Long Length of Stay Meetings, dates discussed:    Additional Comments:  Marilu Favre, RN 10/20/2014, 1:44 PM

## 2014-10-21 LAB — BASIC METABOLIC PANEL
Anion gap: 11 (ref 5–15)
BUN: 17 mg/dL (ref 6–20)
CHLORIDE: 103 mmol/L (ref 101–111)
CO2: 28 mmol/L (ref 22–32)
CREATININE: 1.84 mg/dL — AB (ref 0.44–1.00)
Calcium: 8 mg/dL — ABNORMAL LOW (ref 8.9–10.3)
GFR calc Af Amer: 32 mL/min — ABNORMAL LOW (ref >60)
GFR calc non Af Amer: 27 mL/min — ABNORMAL LOW (ref >60)
GLUCOSE: 116 mg/dL — AB (ref 65–99)
POTASSIUM: 3.1 mmol/L — AB (ref 3.5–5.1)
Sodium: 142 mmol/L (ref 135–145)

## 2014-10-21 MED ORDER — METOPROLOL TARTRATE 25 MG PO TABS: 12.5000 mg | ORAL_TABLET | Freq: Two times a day (BID) | ORAL | Status: DC

## 2014-10-21 MED ORDER — POLYETHYLENE GLYCOL 3350 17 G PO PACK: 17.0000 g | PACK | Freq: Every day | ORAL | Status: DC | PRN

## 2014-10-21 MED ORDER — TRAMADOL HCL 50 MG PO TABS: 50.0000 mg | ORAL_TABLET | Freq: Two times a day (BID) | ORAL | Status: DC | PRN

## 2014-10-21 MED ORDER — ISOSORBIDE MONONITRATE ER 30 MG PO TB24: 30.0000 mg | ORAL_TABLET | Freq: Every day | ORAL | Status: DC

## 2014-10-21 MED ORDER — SACCHAROMYCES BOULARDII 250 MG PO CAPS: 250.0000 mg | ORAL_CAPSULE | Freq: Two times a day (BID) | ORAL | Status: DC

## 2014-10-21 MED ORDER — TIZANIDINE HCL 2 MG PO TABS: 2.0000 mg | ORAL_TABLET | Freq: Four times a day (QID) | ORAL | Status: DC | PRN

## 2014-10-21 MED ORDER — ZOLPIDEM TARTRATE 5 MG PO TABS: 5.0000 mg | ORAL_TABLET | Freq: Every evening | ORAL | Status: DC | PRN

## 2014-10-21 MED ORDER — TIOTROPIUM BROMIDE MONOHYDRATE 18 MCG IN CAPS: 18.0000 ug | ORAL_CAPSULE | Freq: Every day | RESPIRATORY_TRACT | Status: DC

## 2014-10-21 NOTE — Discharge Summary (Signed)
Discharge Summary  Alexa Key YHC:[id number] DOB: April 16, 1951  PCP: [name], PA-C  Admit date: 09/29/2014 Discharge date: 10/21/2014  Time spent: 25 min  Recommendations for Outpatient Follow-up:  1. Alexa Key has scheduled follow-up appointment with Dr. Brantley Stage, [facility] surgery on 6/28 2. Alexa Key will follow-up with [facility] in the next 2 weeks. 3. New medication: Imdur 30 mg by mouth daily 4. Medication change: Cozaar being discontinued secondary to renal insufficiency 5. New medications: When necessary Tiazac for muscle spasms 6. New medications: When necessary Ambien for sleep 7. Medication change: Metoprolol 25 mg daily converted to 12.5 by mouth twice a day 8. New medication: Florastor 250 mg by mouth twice a day 1 month 9. New medication: Ultram 50 g by mouth every 12 hours when necessary for pain 10. Alexa Key will need daily weights first thing in the morning: Continue home medications 11. Alexa Key will need close monitoring of colostomy output (amount and quality) as well as wound VAC output (amount and quality) with results to be a recorded daily, with these results to be taken to her follow-up surgery appointment  Discharge Diagnoses:  Active Hospital Problems   Diagnosis Date Noted  . 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 Acute on chronic diastolic heart failure   . Ileus   . Hypoxia   . E-coli UTI   . Leukocytosis   . Acute cystitis without hematuria   . ARF (acute renal failure)   . Pre-operative cardiovascular examination, myocardial ischemia   . Abdominal pain 09/29/2014  . CAD in native artery 09/29/2014  . CKD (chronic kidney disease) stage 3, GFR 30-59 ml/min 09/29/2014  . GERD (gastroesophageal reflux disease) 09/29/2014  . Bacteriuria 09/29/2014  . COPD (chronic obstructive pulmonary disease) 08/11/2014  . Essential hypertension 10/16/2013  . Colovaginal fistula s/p  colectomy/colostomy/repair 10/02/2014 11/15/2012  . Tobacco user 09/25/2012  . Acute renal failure 08/07/2012    Resolved Hospital Problems   Diagnosis Date Noted Date Resolved  . 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  Hospital Course:  Alexa Key unable to get cardiac catheterization due to acute renal failure on day of catheterization. Due to urgent need for surgery, family accepted risks for surgery and Alexa Key underwent surgical evaluation which led to sigmoid colectomy with end colostomy takedown due to colovaginal fistula and wound VAC.  Following surgery, Alexa Key with persistent leukocytosis and found to have UTI. Cardiology consulted after Alexa Key started having chest pain with mildly elevated troponin. On 6/13, Alexa Key's hemoglobin had dropped to as low as 7.8 prompting transfusion of 2 units packed red blood cells. She did not have an acute drop slowly trending down since admission. She has also since developed postop ileus.  Alexa Key's creatinine has remained stable. Alexa Key underwent cardiac catheterization on 6/15 noting three-vessel disease although moderate. Only a very distal vessel noted up to 70% blockage. Plan is to manage this medically and if Alexa Key needs surgery, felt to be an acceptable cardiovascular risk. After reviewing repeat CT scan, surgery is planned is  to try to manage conservatively.  Diet was slowly advanced and Alexa Key has been able to tolerate this well. Plan will be for her to be  discharged to a skilled nursing facility with follow-up with Gen. surgery in 2 weeks.   For several days, Alexa Key has been tolerating by mouth without abdominal pain, ambulating. Creatinine has been slowly trending up even after stopping Lasix. Gentle IV fluids added today. 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 Alexa Key will go to skilled nursing with close monitoring of input and output of her wound VAC and colostomy on a daily basis.  Active Problems: Acute kidney injury in the setting of stage III chronic kidney disease: Initially felt to be secondary to recent CT scan done with contrast prior to admission plus episodes of emesis plus poor oral intake. Renal ultrasounds unrevealing. Creatinine came back to baseline. Following aggressive diuresis Alexa Key's creatinine started trending upward and Alexa Key felt to be over diuresed. Lasix stopped and gentle IV fluids given. Plan will be to recheck basic metabolic panel at the end of this week.   Tobacco user: Alexa Key has been on nicotine patch at low dose since hospitalization.    Colovaginal fistula s/p colectomy/colostomy/repair 10/02/2014   Essential hypertension: Cozaar discontinued secondary to renal dysfunction. Imdur added in its place.   COPD (chronic obstructive pulmonary disease): Stable medical issue during this hospitalization.    CAD in native artery: For now will manage medically. Follow-up cardiology in 2 weeks as outpatient    E-coli UTI: Completed antibiotics course during this hospitalization.    Acute on chronic diastolic heart failure following cardiac catheterization which confirmed grade 2 diastolic dysfunction, Alexa Key's weight up significantly and so she was aggressively diuresed. Weight on day of discharge her 134 pounds, likely this is Alexa Key's dry weight.   Consultants:  Cardiology  General  surgery  Procedures: Sigmoid colon colectomy with in colostomy takedown of colovaginal fistula per Dr. Brantley Stage 10/02/2014 Cardiac catheterization 6/15: Three-vessel moderate disease  Discharge Exam: BP 154/53 mmHg  Pulse 62  Temp(Src) 98.5 F (36.9 C) (Oral)  Resp 17  Ht 5\' 1"  (1.549 m)  Wt 60.782 kg (134 lb)  BMI 25.33 kg/m2  SpO2 96%  General: Alert and oriented 3 Cardiovascular: Regular rate and rhythm, G6-Y4, 2/6 systolic ejection murmur Respiratory: Clear to auscultation bilaterally  Discharge Instructions You were cared for by a hospitalist during your hospital stay. If you have any questions about your discharge medications or the care you received while you were in the hospital after you are discharged, you can call the unit and asked to speak with the hospitalist on call if the hospitalist that took care of you is not available. Once you are discharged, your primary care physician will handle any further medical issues. Please note that NO REFILLS for any discharge medications will be authorized once you are discharged, as it is imperative that you return to your primary care physician (or establish a relationship with a primary care physician if you do not have one) for your aftercare needs so that they can reassess your need for medications and monitor your lab values.  Discharge Instructions    AMB Referral to [facility]    Complete by:  As directed   Reason for consult:  Post surgical hospitalization with long hospital stay; community follow up for needs  Diagnoses of:   Kidney Failure COPD/ Pneumonia    Expected date of contact:  1-3 days (reserved for hospital discharges)     Diet - low sodium heart healthy    Complete by:  As directed      Increase activity slowly    Complete by:  As directed             Medication List    STOP taking these medications        fenofibrate 160 MG tablet     HYDROcodone-acetaminophen 5-325 MG per tablet  Commonly  known as:  NORCO/VICODIN     losartan 100 MG tablet  Commonly known as:  COZAAR      TAKE these medications        acetaminophen 500 MG tablet  Commonly known as:  TYLENOL  Take 1,000 mg by mouth every 6 (six) hours as needed for pain.     albuterol 108 (90 BASE) MCG/ACT inhaler  Commonly known as:  PROVENTIL HFA;VENTOLIN HFA  Inhale 2 puffs into the lungs every 6 (six) hours as needed for wheezing or shortness of breath.     amLODipine 10 MG tablet  Commonly known as:  NORVASC  Take 1 tablet (10 mg total) by mouth daily.     aspirin 325 MG tablet  Take 325 mg by mouth daily.     budesonide-formoterol 160-4.5 MCG/ACT inhaler  Commonly known as:  SYMBICORT  Inhale 2 puffs into the lungs 2 (two) times daily.     cholecalciferol 1000 UNITS tablet  Commonly known as:  VITAMIN D  Take 2 tablets (2,000 Units total) by mouth at bedtime.     isosorbide mononitrate 30 MG 24 hr tablet  Commonly known as:  IMDUR  Take 1 tablet (30 mg total) by mouth daily.     metoprolol tartrate 25 MG tablet  Commonly known as:  LOPRESSOR  Take 0.5 tablets (12.5 mg total) by mouth 2 (two) times daily.     omeprazole 20 MG capsule  Commonly known as:  PRILOSEC  TAKE 1 CAPSULE (20 MG TOTAL)  BY MOUTH DAILY.     polyethylene glycol packet  Commonly known as:  MIRALAX / GLYCOLAX  Take 17 g by mouth daily as needed for mild constipation.     pravastatin 80 MG tablet  Commonly known as:  PRAVACHOL  Take 1 tablet (80 mg total) by mouth at bedtime.     saccharomyces boulardii 250 MG capsule  Commonly known as:  FLORASTOR  Take 1 capsule (250 mg total) by mouth 2 (two) times daily.     tiotropium 18 MCG inhalation capsule  Commonly known as:  SPIRIVA  Place 1 capsule (18 mcg total) into inhaler and inhale daily.     tiZANidine 2 MG tablet  Commonly known as:  ZANAFLEX  Take 1 tablet (2 mg total) by mouth every 6 (six) hours as needed.     traMADol 50 MG tablet  Commonly known as:  ULTRAM   Take 1 tablet (50 mg total) by mouth every 12 (twelve) hours as needed for moderate pain.     zolpidem 5 MG tablet  Commonly known as:  AMBIEN  Take 1 tablet (5 mg total) by mouth at bedtime as needed for sleep.       Allergies  Allergen Reactions  . Penicillins Rash  . Sulfa Antibiotics Rash       Follow-up Information    Follow up with CORNETT,THOMAS A., MD. Call on 10/28/2014.   Specialty:  General Surgery   Why:  For post-operation check with Dr.  Cornett 10/28/14 appt 3:20 pm.  Please arrive no later than  3:00 pm to check in and fill out paperwork.   Contact information:   [address] [place] [address] [phone]        The results of significant diagnostics from this hospitalization (including imaging, microbiology, ancillary and laboratory) are listed below for reference.    Significant Diagnostic Studies: Ct Abdomen Pelvis Wo Contrast  10/16/2014   CLINICAL DATA:  Follow-up exam for assessment and plan care for abdominal wound, colostomy in left lower quadrant, abdominal infection  EXAM: CT ABDOMEN AND PELVIS WITHOUT CONTRAST  TECHNIQUE: Multidetector CT imaging of the abdomen and pelvis was performed following the standard protocol without IV contrast.  COMPARISON:  09/29/2014 and 10/14/2014  FINDINGS: Sagittal images of the spine shows degenerative changes thoracolumbar spine. Again noted extensive atherosclerotic calcifications of abdominal aorta SMA bilateral common iliac arteries bilateral renal artery. There is small left pleural effusion. Trace right pleural effusion. Bilateral basilar posterior atelectasis. Coronary artery calcifications are again noted.  No calcified gallstones are noted within gallbladder. Oral contrast material was given to the Alexa Key. No intrahepatic biliary ductal dilatation. The unenhanced pancreas, spleen and adrenal glands are unremarkable. Stable saccular aneurysm infrarenal abdominal aorta without evidence of acute  complication.  Multiple vascular calcifications are noted bilateral renal hilum. Again noted nonobstructive calcification in midpole of the left kidney measures 5.5 mm. Cortical scarring in midpole of the left kidney is again noted. Additional nonobstructive left renal calculi.  No hydronephrosis or hydroureter.  No calcified ureteral calculi are noted.  Again noted postsurgical changes post resection of distal left colon and sigmoid colon. A colostomy is noted in left lower quadrant. There is no evidence of colonic obstruction. No stricture is noted at the colostomy level.  The rectosigmoid pouch is unremarkable.  Again noted postsurgical changes with midline abdominal scarring. Again noted mild infiltration of deep fat plane just above the left rectus muscle at the base of incision and small amount of air deep along the incision site. There is minimal swelling of the left rectus muscle. Although findings may be postsurgical in nature. Inflammatory changes and mild myositis cannot be excluded. Minimal confluent fluid is noted inferior aspect of the incision just above the fascia in axial image 69 measures about 1.7 cm without definite evidence of on abscess. Mild inflammatory changes/infection cannot be excluded.  There is contrast material within urinary bladder probable from previous CT scan.  There are mild distended small bowel loops in mid lower abdomen with contrast material without evidence of small bowel obstruction. Probable mild ileus. No thickening of small bowel wall.  The Alexa Key is status post hysterectomy. The terminal ileum is unremarkable. Small amount of contrast is noted in distal esophagus probable from gastroesophageal reflux. No gastric outlet obstruction. There is mild anasarca infiltration of subcutaneous fat bilateral flank wall and bilateral lateral pelvic wall.  IMPRESSION: 1. Again noted postsurgical changes post resection of sigmoid colon and distal left colon. A colostomy is noted in  left lower quadrant. There is no evidence of stricture at colostomy size. No evidence of colonic obstruction. 2. Again noted postsurgical changes with midline abdominal scarring. Again noted mild infiltration of deep fat plane just above the left rectus muscle at the base of incision and small amount of air deep along the incision site. There is minimal swelling of the left rectus muscle. Although findings may be postsurgical in nature. Inflammatory changes and mild myositis cannot be excluded. Minimal  confluent fluid is noted inferior aspect of the incision just above the fascia in axial image 69 measures about 1.7 cm without definite evidence of on abscess. Mild inflammatory changes/infection cannot be excluded. 3. Mild dilated small bowel loops containing contrast in mid abdomen without evidence of small bowel obstruction. Findings probable reflect ileus. 4. No pericecal inflammation. 5. Again noted left nonobstructive nephrolithiasis. Scarring in left kidney again noted. No hydronephrosis or hydroureter. No calcified ureteral calculi. 6. Status post hysterectomy. 7.   Electronically Signed   By: Lahoma Crocker M.D.   On: 10/16/2014 15:21   Ct Abdomen Pelvis Wo Contrast  10/13/2014   CLINICAL DATA:  64 year old female 10 days post exploratory laparotomy with sigmoid colectomy, colovaginal fistula takedown and colostomy formation. Sigmoid colon stricture noted at the time of surgery. Nausea with vomiting phlegm. Subsequent encounter.  EXAM: CT ABDOMEN AND PELVIS WITHOUT CONTRAST  TECHNIQUE: Multidetector CT imaging of the abdomen and pelvis was performed following the standard protocol without IV contrast.  COMPARISON:  Plain film examination 10/07/2014. Preoperative CT 09/29/2014.  FINDINGS: Post resection portion of the descending colon and sigmoid colon. Rectosigmoid pouch without complication. Left lower abdominal colostomy site without complication.  Subcutaneous edema most notable buttock and upper thigh  region greater on the right may represent dependent edema or third spacing of fluid.  Anterior abdominal/pelvic wall incision with gas and mild infiltration of fat planes at the base of the incision site which may represent residua of healing by secondary intent without well-defined drainable abscess.  Contrast and partially gas-filled prominent size loops of small bowel with change of caliber in the left lower quadrant. This may reflect result of adhesions and mild partial obstruction. The small bowel loops extend towards the base of the incision site but do not appear to enter such.  Basilar subsegmental atelectasis with small left-sided pleural effusion.  Heart size top-normal coronary artery calcifications and aortic root calcifications.  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 Ectasia right common iliac artery. Narrowing of the left common iliac artery.  Gallstones/gallbladder sludge.  Taking into account limitation by non contrast imaging, no worrisome hepatic, splenic, pancreatic or adrenal lesion.  Scarring of the kidneys greater on the left. Fullness of the renal collecting systems without calcified obstructing stone identified. Left renal nonobstructing calculi and/or vascular calcifications unchanged. Left renal sub cm hyperdense lesions possibly hemorrhagic cysts although incompletely assessed.  Partially contracted noncontrast filled urinary bladder without obvious abnormality.  Postsurgical changes L5-S1.  IMPRESSION: Post resection portion of the descending colon and sigmoid colon. Rectosigmoid pouch without complication. Left lower abdominal colostomy site without complication.  Anterior abdominal/pelvic wall incision with gas and mild infiltration of fat planes at the base of the incision site which may represent residua of healing by secondary intent without well-defined drainable abscess.  Contrast and  partially gas-filled prominent size loops of small bowel with change of caliber in the left lower quadrant. This may reflect result of adhesions and mild partial obstruction. The small bowel loops extend towards the base of the incision site but do not appear to enter such.  Basilar subsegmental atelectasis with small left-sided pleural effusion.  Heart size top-normal coronary artery calcifications and aortic root calcifications.  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  Gallstones/gallbladder sludge.  Scarring of the kidneys greater on the left.  Fullness of the renal collecting systems without calcified obstructing stone identified.  Dependent edema/third spacing of fluid.   Electronically Signed   By: Genia Del M.D.   On: 10/13/2014 15:44   Dg Chest 1 View  10/15/2014   CLINICAL DATA:  Chest rales  EXAM: CHEST  1 VIEW  COMPARISON:  10/05/2014  FINDINGS: Chronic pleural scarring in the right lung base is stable from prior studies. NG tube is been removed  Cardiac enlargement. Negative for heart failure or pneumonia. Left lung is clear.  IMPRESSION: No active disease.   Electronically Signed   By: Franchot Gallo M.D.   On: 10/15/2014 09:02   Dg Abd 1 View  10/07/2014   CLINICAL DATA:  64 year old with vomiting. Recent abdominal surgery.  EXAM: ABDOMEN - 1 VIEW  COMPARISON:  Prior CT scan of the abdomen and pelvis 09/29/2014  FINDINGS: Multiple dilated air-filled loops of small bowel noted throughout abdomen. Left lower quadrant noted. No evidence of large free air on this supine. Small right pleural. Surgical changes prior interbody cage placement -S1.  IMPRESSION: Dilated and air-filled loops of small bowel in the mid abdomen concerning for small bowel obstruction.  Small right pleural effusion.   Electronically Signed   By: Jacqulynn Cadet M.D.   On: 10/07/2014 21:47   Ct Abdomen Pelvis W  Contrast  09/29/2014   CLINICAL DATA:  Lower abdominal pain  EXAM: CT ABDOMEN AND PELVIS WITH CONTRAST  TECHNIQUE: Multidetector CT imaging of the abdomen and pelvis was performed using the standard protocol following bolus administration of intravenous contrast.  CONTRAST:  44mL OMNIPAQUE IOHEXOL 300 MG/ML SOLN, 59mL OMNIPAQUE IOHEXOL 300 MG/ML SOLN  COMPARISON:  None.  FINDINGS: Lung bases are free of acute infiltrate or sizable effusion.  The liver, gallbladder, spleen, adrenal glands and pancreas are within normal limits. The kidneys are well visualized and demonstrate right renal cystic change stable from the prior exam. Stable renal calculi are noted on the left. 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 It is stable in appearance from the prior exam. Stable scattered atherosclerotic changes are noted throughout the aortoiliac system.  The colon is somewhat distended with air with area of wall thickening and narrowing in the sigmoid colon. This has progressed in the interval from the prior exam. Given findings from recent sigmoidoscopy this likely represents progressive smooth muscle thickening. Clinical correlation is recommended. The bladder is decompressed. No pelvic mass lesion is noted. The bony structures demonstrate postoperative change in the lumbar spine.  IMPRESSION: Stable cystic and calculus changes in the kidneys bilaterally.  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  Increased colonic distention with air secondary to an area of narrowing in the sigmoid colon. This likely represents progressive smooth muscle thickening and narrowing. The need for direct visualization can be determined on a clinical basis.   Electronically Signed   By: Inez Catalina M.D.   On: 09/29/2014 14:07   US Renal  10/01/2014   CLINICAL DATA:  Acute renal failure.  EXAM: RENAL / URINARY TRACT ULTRASOUND COMPLETE  COMPARISON:   CT 09/29/2014  FINDINGS: Right Kidney:  Length: 10.2 cm. Small 9 mm cyst in the upper pole. Normal echotexture. Normal echotexture no hydronephrosis.  Left Kidney:  Length: 10.1 cm. Normal echotexture. No hydronephrosis. 12 mm cyst in the upper pole. 19 mm hypoechoic parapelvic area in the lower pole, likely small parapelvic cysts. This was seen  scratch head this appears to be a cyst on prior CT. Mild cortical thinning within the mid and upper pole.  Bladder:  Appears normal for degree of bladder distention.  IMPRESSION: Small bilateral renal cysts. No hydronephrosis. No acute findings. Scarring in the mid and upper pole of the left kidney.   Electronically Signed   By: Rolm Baptise M.D.   On: 10/01/2014 14:53   Dg Chest Port 1 View  10/05/2014   CLINICAL DATA:  Hypoxia  EXAM: PORTABLE CHEST - 1 VIEW  COMPARISON:  Radiograph 10/03/2014  FINDINGS: NG tube extends the stomach. Stable mildly enlarged cardiac silhouette. There is interval improvement in basilar atelectasis seen on prior. Mild peripheral linear pattern remains.  IMPRESSION: 1. Improvement in right basilar atelectasis. 2. Mild interstitial edema pattern.   Electronically Signed   By: Suzy Bouchard M.D.   On: 10/05/2014 09:41   Dg Chest Port 1 View  10/03/2014   CLINICAL DATA:  64 year old female with a history of leukocytosis.  EXAM: PORTABLE CHEST - 1 VIEW  COMPARISON:  Chest x-ray 12/20/2012, 08/09/2012. Prior abdominal CT 09/29/2014 out prior chest CT 05/19/2005  FINDINGS: Cardiac diameter unchanged. Right rotation of the Alexa Key accentuates the right heart border, with increased opacity along the right-sided mediastinum. Surgical changes in the right hilum.  No pneumothorax.  Elevation of the right hemidiaphragm. Right base not well visualized.  Interstitial opacities of the bilateral lungs.  Gastric tube projects over the mediastinum, terminating out of the field of view.  IMPRESSION: Limited chest x-ray demonstrates bilateral interstitial  opacities and right basilar airspace disease, potentially multifocal infection including atypical infections as well as aspiration pneumonitis/pneumonia.  Surgical changes of the right hilar region, status post lobectomy. If the Alexa Key has a history of lung carcinoma, further evaluation with chest CT may be useful, as the lung changes are new from the most recent chest x-ray of 12/20/2012.  Gastric tube projects over the mediastinum.  Signed,  Dulcy Fanny. Earleen Newport, DO  [facility]  [facility]   Electronically Signed   By: Corrie Mckusick D.O.   On: 10/03/2014 09:12   Dg Abd Portable 1v  10/14/2014   CLINICAL DATA:  Ileus.  EXAM: PORTABLE ABDOMEN - 1 VIEW  COMPARISON:  CT 10/13/2014.  FINDINGS: Persistent dilated loops of small bowel noted. Colonic gas pattern is normal. These findings suggest mild partial small-bowel obstruction versus adynamic ileus. No free air. Mild residual oral contrast noted. Ostomy site left lower quadrant. Aortoiliac atherosclerotic vascular disease. Prior lumbosacral spine fusion. No acute bony abnormality. Surgical clip right lower quadrant.  IMPRESSION: 1. Persistent dilated loops of small bowel with normal colonic gas pattern. These findings suggest persistent mild partial small-bowel obstruction versus adynamic ileus. Ostomy site noted left lower quadrant. 2. Aortoiliac atherosclerotic vascular disease.   Electronically Signed   By: Marcello Moores  Register   On: 10/14/2014 07:37    Microbiology: No results found for this or any previous visit (from the past 240 hour(s)).   Labs: Basic Metabolic Panel:  Recent Labs Lab 10/16/14 0530 10/17/14 0331 10/18/14 0410 10/19/14 0425 10/20/14 0744 10/21/14 0550  NA 144 142 142 144 141 142  K 3.8 3.4* 3.4* 3.4* 3.0* 3.1*  CL 104 102 100* 98* 97* 103  CO2 30 31 30  34* 33* 28  GLUCOSE 113* 94 95 93 110* 116*  BUN 7 7 10 14 19 17   CREATININE 1.22* 1.34* 1.53* 1.82* 1.95* 1.84*  CALCIUM 8.2*  8.4* 8.2* 8.4* 8.3*  8.0*  MG 1.8  --   --   --   --   --    Liver Function Tests: No results for input(s): AST, ALT, ALKPHOS, BILITOT, PROT, ALBUMIN in the last 168 hours. No results for input(s): LIPASE, AMYLASE in the last 168 hours. No results for input(s): AMMONIA in the last 168 hours. CBC:  Recent Labs Lab 10/16/14 0530 10/17/14 0331 10/18/14 0410 10/19/14 0425 10/20/14 0744  WBC 9.5 9.3 9.3 8.8 8.5  HGB 10.0* 10.7* 10.7* 11.5* 10.8*  HCT 32.9* 34.6* 35.0* 37.0 35.0*  MCV 96.2 96.9 96.2 95.9 95.1  PLT 264 278 234 239 237   Cardiac Enzymes: No results for input(s): CKTOTAL, CKMB, CKMBINDEX, TROPONINI in the last 168 hours. BNP: BNP (last 3 results) No results for input(s): BNP in the last 8760 hours.  ProBNP (last 3 results) No results for input(s): PROBNP in the last 8760 hours.  CBG: No results for input(s): GLUCAP in the last 168 hours.     Signed:  Annita Brod  [facility] 10/21/2014, 11:55 AM

## 2014-10-21 NOTE — Progress Notes (Signed)
Report called to [facility] and given to Ronnie Derby, LPN. All patient documents ready to be sent with patient. Patient refused to have wound vac dressing changed today. Attempted numerous of times but patient states "she is just ready to go and she will let them change it next time. MD notified and is aware. Patient ready for discharge.

## 2014-10-21 NOTE — Progress Notes (Signed)
Nutrition Follow-up  DOCUMENTATION CODES:  Not applicable  INTERVENTION:   (Continue with Heart Healthy diet)   -D/c Ensure Enlive po BID, each supplement provides 350 kcal and 20 grams of protein, due to poor acceptance  NUTRITION DIAGNOSIS:  Inadequate oral intake related to altered GI function as evidenced by meal completion < 50%, per patient/family report.  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 Patient had decreased appetite. Patient was scheduled for cardiac catheterization on 10/03/2014 for medical clearance for surgical intervention of fistula or stricture. Patient was admitted and general surgery consulted as patient had presented with a colonic obstruction.  Pt s/p exploratory laparotomy with sigmoid colectomy, colovaginal fistula takedown and colostomy on 10/02/14.   Pt has been advanced to a heart healthy diet. Intake has improved greatly; PO: 25-100%. Staff report pt is also providing outside food. Pt is refusing Ensure supplements- RD will d/c in light of increased PO intake and poor acceptance.   Pt remains with abdominal vac. Drainage decreasing per surgery (1000 ml output within 24 hours).   Noted 100 ml output from colostomy within the past 24 hours.   CSW following. Plan is to d/c to [facility] SNF, likely today.  Height:  Ht Readings from Last 1 Encounters:  09/29/14 _0  (1.549 m)    Weight:  Wt Readings from Last 1 Encounters:  10/21/14 134 lb (60.782 kg)    Ideal Body Weight:  47.7 kg  Wt  Readings from Last 10 Encounters:  10/21/14 134 lb (60.782 kg)  09/22/14 143 lb 6.4 oz (65.046 kg)  09/10/14 142 lb (64.411 kg)  08/27/14 142 lb (64.411 kg)  08/11/14 142 lb (64.411 kg)  08/01/14 136 lb (61.689 kg)  07/21/14 137 lb 9.6 oz (62.415 kg)  07/11/14 140 lb (63.504 kg)  05/19/14 138 lb 3.2 oz (62.687 kg)  05/08/14 137 lb 12.8 oz (62.506 kg)    BMI:  Body mass index is 25.33 kg/(m^2).  Estimated Nutritional Needs:  Kcal:  1600-1800  Protein:  80-90 grams  Fluid:  >1.8 L  Skin:  Wound (see comment) (abdominal wound vac)  Diet Order:  Diet Heart Room service appropriate?: Yes; Fluid consistency:: Thin  EDUCATION NEEDS:  No education needs identified at this time   Intake/Output Summary (Last 24 hours) at 10/21/14 1130 Last data filed at 10/21/14 1115  Gross per 24 hour  Intake   1905 ml  Output    750 ml  Net   1155 ml    Last BM:  10/21/14  Kerrick Miler A. Jimmye Norman, RD, LDN, CDE Pager: [phone] After hours Pager: [phone]

## 2014-10-21 NOTE — Progress Notes (Signed)
[facility] Progress Note  6 Days Post-Op  Subjective: Still feels great today.  No N/V or abdominal pain.  Still purulent drainage in wound vac, but less quantity (18mL/24hr).  Ambulating well.  Tolerating diet well.    Objective: Vital signs in last 24 hours: Temp:  [97.4 F (36.3 C)-98.7 F (37.1 C)] 98.5 F (36.9 C) (06/21 0517) Pulse Rate:  [60-62] 62 (06/21 0517) Resp:  [16-17] 17 (06/21 0517) BP: (126-154)/(50-65) 154/53 mmHg (06/21 0517) SpO2:  [96 %-98 %] 96 % (06/21 0850) FiO2 (%):  [24 %] 24 % (06/21 0850) Weight:  [60.782 kg (134 lb)] 60.782 kg (134 lb) (06/21 0800) Last BM Date: 10/21/14  Intake/Output from previous day: 06/20 0701 - 06/21 0700 In: 1665 [P.O.:840; I.V.:825] Out: 350 [Urine:240; Drains:100; Stool:10] Intake/Output this shift:    PE: Gen:  Alert, NAD, pleasant Abd: Soft, ND,much less tender around ostomy and incision, most tender spot is skin between ostomy and incision site, +BS, no HSM, midline wound with wound vac in place with purulent sanguinous drainage, Ostomy has significant mucocutaneous separation but has sloughed some of its necrotic tissue away and now some pink tissue present, brown stool output and lots of flatus found in ostomy bag. VAC canister with tan-ish red drainage which is nearly full since Thursday (138mL in last 24 hours).  Lab Results:   Recent Labs  10/19/14 0425 10/20/14 0744  WBC 8.8 8.5  HGB 11.5* 10.8*  HCT 37.0 35.0*  PLT 239 237   BMET  Recent Labs  10/20/14 0744 10/21/14 0550  NA 141 142  K 3.0* 3.1*  CL 97* 103  CO2 33* 28  GLUCOSE 110* 116*  BUN 19 17  CREATININE 1.95* 1.84*  CALCIUM 8.3* 8.0*   PT/INR No results for input(s): LABPROT, INR in the last 72 hours. CMP     Component Value Date/Time   NA 142 10/21/2014 0550   NA 145* 01/17/2013 1703   K 3.1* 10/21/2014 0550   CL 103 10/21/2014 0550   CO2 28 10/21/2014 0550   GLUCOSE 116* 10/21/2014 0550   GLUCOSE 82 01/17/2013  1703   BUN 17 10/21/2014 0550   BUN 26 01/17/2013 1703   CREATININE 1.84* 10/21/2014 0550   CREATININE 1.69* 11/05/2012 1541   CALCIUM 8.0* 10/21/2014 0550   PROT 7.2 09/29/2014 1107   PROT 6.5 01/17/2013 1703   ALBUMIN 2.2* 10/03/2014 0309   AST 22 09/29/2014 1107   ALT 18 09/29/2014 1107   ALKPHOS 77 09/29/2014 1107   BILITOT 0.3 09/29/2014 1107   GFRNONAA 27* 10/21/2014 0550   GFRNONAA 32* 11/05/2012 1541   GFRAA 32* 10/21/2014 0550   GFRAA 36* 11/05/2012 1541   Lipase     Component Value Date/Time   LIPASE 29 09/29/2014 1108       Studies/Results: No results found.  Anti-infectives: Anti-infectives    Start     Dose/Rate Route Frequency Ordered Stop   10/07/14 1800  cefUROXime (CEFTIN) tablet 500 mg  Status:  Discontinued     500 mg Oral 2 times daily with meals 10/07/14 1203 10/11/14 1611   10/03/14 1530  cefTRIAXone (ROCEPHIN) 1 g in dextrose 5 % 50 mL IVPB - Premix  Status:  Discontinued     1 g 100 mL/hr over 30 Minutes Intravenous Every 24 hours 10/03/14 1454 10/07/14 1203   10/02/14 2200  Levofloxacin (LEVAQUIN) IVPB 250 mg  Status:  Discontinued     250 mg 50 mL/hr over 60 Minutes Intravenous  Every 24 hours 10/01/14 2105 10/02/14 2006   10/02/14 2200  Levofloxacin (LEVAQUIN) IVPB 250 mg  Status:  Discontinued     250 mg 50 mL/hr over 60 Minutes Intravenous Every 24 hours 10/02/14 1000 10/03/14 1454   10/02/14 0800  clindamycin (CLEOCIN) IVPB 900 mg     900 mg 100 mL/hr over 30 Minutes Intravenous To ShortStay [facility] 10/01/14 1238 10/02/14 0933   10/02/14 0800  gentamicin (GARAMYCIN) IVPB 100 mg     100 mg 200 mL/hr over 30 Minutes Intravenous To ShortStay Surgical 10/01/14 1238 10/02/14 0938   10/01/14 2200  levofloxacin (LEVAQUIN) IVPB 500 mg     500 mg 100 mL/hr over 60 Minutes Intravenous  Once 10/01/14 2059 10/02/14 0104       Assessment/Plan Large bowel obstruction secondary to sigmoid colon stricture and colovaginal fistula  POD #19  exploratory laparotomy with sigmoid colectomy, colovaginal fistula takedown and colostomy--Dr. Brantley Stage -10/13/14 - CT abd/pelvis showed normal post-op changes, dilated loops of small bowel, no acute findings or drainable abscesses -10/16/14 - CT abd/pelvis showed extraadominal connection between area of infection near colostomy -Ileus seems resolved, no more nausea, and good colostomy output -HH diet -PT -Fever/wbc normalized Post-operative wound dehiscence -Continue wound vac Tu/Th/Sa, can switch to M/W/F at SNF Colostomy mucocutaneous separation/necrosis -Continue with ostomy care, as long as it continues to improve and output continues will not need further surgery. It would be very difficult to revise this at this time.  ID- ceftin for UTI - completed. VTE prophylaxis-SCD/heparin NSTEMI - cardiology following, cath recommended medical management only Pulmonary-hypoxia has resolved FEN-soft, on oral pain meds Disp-When medically stable can d/c to SNF, Will need careful monitoring of colostomy output (amount and quality) as well as wound vac output (amount and quality). These numbers should be recorded daily. She will need continued teaching regarding ostomy management. Wound vac can be changed to MWF for convenience if needed, but is currently Tu/Th/Sa. Continue with HH diet. Will schedule follow up with Dr. Brantley Stage 10/28/14 appt 3:20pm for a close follow up given wound status. Would have her continue stool softeners, miralax and probiotic.    LOS: 22 days    Alexa Key 10/21/2014, 9:35 AM Pager: [phone]

## 2014-10-21 NOTE — Care Management Note (Signed)
Case Management Note  Patient Details  Name: Alexa Key MRN: [id number] Date of Birth: November 08, 1950  Subjective/Objective:                    Action/Plan:   Expected Discharge Date:    10-21-14              Expected Discharge Plan:  [place]  In-House Referral:  Clinical Social Work  Discharge planning Services  CM Consult  Post Acute Care Choice:    Choice offered to:     DME Arranged:    DME Agency:     HH Arranged:    [place] Agency:     Status of Service:  In process, will continue to follow  Medicare Important Message Given:  Yes Date Medicare IM Given:  10/21/14 Medicare IM give by:  Magdalen Spatz RN BSN  Date Additional Medicare IM Given:  10/13/14 Additional Medicare Important Message give by:  Ellan Lambert, RN, BSN   If discussed at Long Length of Stay Meetings, dates discussed:  10-21-14  Additional Comments:  Marilu Favre, RN 10/21/2014, 11:01 AM

## 2014-10-21 NOTE — Clinical Social Work Note (Addendum)
Patient to be d/c'ed today to [facility].  Patient and family agreeable to plans will transport via ems RN to call report.  Patient's daughter requested a phone call once patient has left hospital, [place] notified bedside nurse.  Evette Cristal, MSW, [place]

## 2014-10-21 NOTE — Progress Notes (Signed)
PTAR present to transport patient to facility. Patient ready for discharge.

## 2014-10-21 NOTE — Progress Notes (Signed)
Physical Therapy Treatment Patient Details Name: Alexa Key MRN: [id number] DOB: 1950/09/20 Today's Date: 10/21/2014    History of Present Illness Patient is a 64 y/o female s/p exploratory laparotomy with sigmoid colectomy, colovaginal fistula takedown and colostomy 6/2. PMH includes CAD,PVD, CVA, HTN,COPD, tobacco use, fibromyalgia and DM.    PT Comments    Pt agreeable to mobility, though c/o feeling weak and fatigued today.  Pt able to transfer with MinA due to mild balance deficits.  Continue to feel pt will need SNF at D/C.    Follow Up Recommendations  SNF     Equipment Recommendations  None recommended by PT    Recommendations for Other Services       Precautions / Restrictions Precautions Precautions: Fall Restrictions Weight Bearing Restrictions: No    Mobility  Bed Mobility               General bed mobility comments: pt sitting EOB.  Transfers Overall transfer level: Needs assistance Equipment used: 1 person hand held assist Transfers: Sit to/from [facility] Sit to Stand: Min guard Stand pivot transfers: Min assist       General transfer comment: pt mildly unsteady with transfers and standing.  Needs UE support on armrests of 3-in-1 during transfer.  pt indicates feeling weak.    Ambulation/Gait                 Stairs            Wheelchair Mobility    Modified Rankin (Stroke Patients Only)       Balance Overall balance assessment: Needs assistance Sitting-balance support: No upper extremity supported;Feet supported Sitting balance-Leahy Scale: Good     Standing balance support: No upper extremity supported;During functional activity Standing balance-Leahy Scale: Fair Standing balance comment: Needs A or UE support during balance challenges.                      Cognition Arousal/Alertness: Awake/alert Behavior During Therapy: WFL for tasks assessed/performed Overall Cognitive Status: Within  Functional Limits for tasks assessed                      Exercises General Exercises - Lower Extremity Ankle Circles/Pumps: AROM;Both;10 reps Long Arc Quad: AROM;Both;10 reps Hip ABduction/ADduction: AROM;Both;10 reps Hip Flexion/Marching: AROM;Both;10 reps    General Comments        Pertinent Vitals/Pain Pain Assessment: Faces Faces Pain Scale: Hurts little more Pain Location: Abdomen during transfers. Pain Descriptors / Indicators: Grimacing;Aching Pain Intervention(s): Premedicated before session;Monitored during session;Repositioned    Home Living                      Prior Function            PT Goals (current goals can now be found in the care plan section) Acute Rehab PT Goals Patient Stated Goal: to live independently again PT Goal Formulation: With patient/family Time For Goal Achievement: 10/20/14 Potential to Achieve Goals: Good Progress towards PT goals: Progressing toward goals    Frequency  Min 3X/week    PT Plan Current plan remains appropriate    Co-evaluation             End of Session Equipment Utilized During Treatment: Oxygen Activity Tolerance: Patient tolerated treatment well Patient left: in bed;with call bell/phone within reach (Sitting EOB)     Time: 5956-3875 PT Time Calculation (min) (ACUTE ONLY): 9 min  Charges:  $  Therapeutic Activity: 8-22 mins                    G CodesCatarina Hartshorn, [place] [phone] 10/21/2014, 1:32 PM

## 2014-10-27 ENCOUNTER — Encounter: Payer: Self-pay | Admitting: *Deleted

## 2014-10-27 ENCOUNTER — Other Ambulatory Visit: Payer: Self-pay

## 2014-10-27 NOTE — Patient Outreach (Signed)
[facility]) Care Management  10/27/2014  Alexa Key 1950-06-03 [id number]   10/27/14- Referral received from hospital liason during RN CM absence last week, colleague covering-[name] states pt is at [facility] and she will inform [name] so he can follow.  RN CM emailed Theadore Nan, LCSW and informed him of the above information.  Jacqlyn Larsen [facility], [place] Coordinator [phone]

## 2014-10-30 ENCOUNTER — Encounter: Payer: Self-pay | Admitting: *Deleted

## 2014-10-30 ENCOUNTER — Telehealth: Payer: Self-pay | Admitting: *Deleted

## 2014-11-04 NOTE — Patient Outreach (Signed)
Received call from Chryl Heck, SW, at [facility] SNF that Alexa Key discharge date is planned for 11/10/14. I asked her to please call when she does leave so that we may follow her for transition of care. Threasa Beards said she would advise me.  Deloria Lair [facility] [phone]

## 2014-11-10 ENCOUNTER — Other Ambulatory Visit: Payer: Self-pay | Admitting: *Deleted

## 2014-11-10 NOTE — Patient Outreach (Signed)
11/10/14- telephone call to [facility] skilled nursing facility, left voicemail with social worker- Roanna Epley, who called back and left voicemail reporting pt is due for discharge home today and also going to see Dr. Donella Stade today and may have wound vac, pt will also have home health through [place].Marland Kitchen  PLAN Follow up with pt this week for transition of care Collaborate with home health  Jacqlyn Larsen [facility], [place] Coordinator [phone]

## 2014-11-13 ENCOUNTER — Telehealth: Payer: Self-pay | Admitting: *Deleted

## 2014-11-13 ENCOUNTER — Other Ambulatory Visit: Payer: Self-pay | Admitting: *Deleted

## 2014-11-13 NOTE — Patient Outreach (Signed)
11/12/14- Telephone call to patient for transition of care week 1, spoke with pt, HIPAA verfied,  Pt discharged from [facility] on Monday 11/10/14.  Pt reports she did see Dr. Donella Stade for her abdominal wound no longer needs wound vac as wound is healing well,  [place] is seeing pt for wound care and management of health conditions.  Pt reports she has all medications and can afford at present, pt states she is going to primary MD appointment 11/17/14 for hospital/ SNF follow up.  Pt states daughter assists with medications and is not present during this phone call, pt states home health also assisted her with her medications.  Pt states she prefers a home visit to discuss medications and other health related issues and is interested in [facility] program.  Initial home visit scheduled for 11/25/14 to accomodate patient's schedule as she has upcoming appointments. RN CM did not mark medication list as reviewed due to need to speak with daughter at the home to verify medications, dosages and frequencies (per pt, daughter oversees this and not available at present) although pt states she has all medications and taking as prescribed. 11/13/14- spoke with daughter Alexa Key and she is not at patient's home with the medication so unable to reconcile medication list although daughter states pt has all medication, reports pt does not have scales and daughter is purchasing scales today as home health directed and pt will start weighing daily, pt has no edema, has colostomy and manages well with this, is on heart healthy diet,  Daughter Alexa Key reports she travels for work but will try to be present for initial home visit.  PLAN Continue weekly transition of care calls See pt for initial home visit 11/25/14 Collaborate with home health

## 2014-11-13 NOTE — Telephone Encounter (Signed)
-----   Message from Danella Maiers sent at 10/30/2014 11:45 AM EDT ----- Regarding: Order for SW Kayleen Memos,  Will you create an Order under Meds and Orders for this patient for me to assign to SW?  Ronnell Freshwater. [place], [place] Management [place] Assistant Phone: [phone] Fax: [phone]

## 2014-11-17 ENCOUNTER — Other Ambulatory Visit: Payer: Self-pay | Admitting: *Deleted

## 2014-11-17 ENCOUNTER — Ambulatory Visit (INDEPENDENT_AMBULATORY_CARE_PROVIDER_SITE_OTHER): Payer: Commercial Managed Care - HMO | Admitting: Family Medicine

## 2014-11-17 ENCOUNTER — Encounter: Payer: Self-pay | Admitting: Family Medicine

## 2014-11-17 VITALS — BP 140/77 | HR 65 | Temp 98.9°F | Ht 61.0 in | Wt 134.2 lb

## 2014-11-17 DIAGNOSIS — I25119 Atherosclerotic heart disease of native coronary artery with unspecified angina pectoris: Secondary | ICD-10-CM

## 2014-11-17 DIAGNOSIS — E785 Hyperlipidemia, unspecified: Secondary | ICD-10-CM | POA: Diagnosis not present

## 2014-11-17 DIAGNOSIS — I10 Essential (primary) hypertension: Secondary | ICD-10-CM

## 2014-11-17 NOTE — Patient Outreach (Signed)
11/17/14- Telephone call to patient for transition of care week 2, HIPAA verified, pt states home health continues to assist her with learning colostomy care. Pt reports her daughter did purchase scales and pt is weighing daily,  Weight today 133 pounds, pt denies edema, dyspnea.  Pt states she has all medications and taking as prescribed, keeping all appointments.  [facility] CM Care Plan Problem One        Patient Outreach Telephone from 11/17/2014 in [place] Problem One  knowledge deficit related to CHF   Care Plan for Problem One  Active   [facility] Long Term Goal (31-90 days)  pt will have no readmissions related to CHF within 90 days.   [facility] Long Term Goal Start Date  11/12/14   Interventions for Problem One Long Term Goal  11/12/14- RN CM reviewed with pt- call for 3 pound weight gain overnight or 5 pounds in one week.   THN CM Short Term Goal #1 (0-30 days)  pt will obtain scales within the next week    [facility]N CM Short Term Goal #1 Start Date  11/12/14   [facility] CM Short Term Goal #1 Met Date  11/17/14   Interventions for Short Term Goal #1  RN CM spoke with daughter Raegyn Renda and ask her to obtain scales and pt to start weighing daily   [facility] CM Short Term Goal #2 (0-30 days)  pt will consistently weigh daily and record within 30 days.   [facility]N CM Short Term Goal #2 Start Date  11/17/14   Interventions for Short Term Goal #2  RN CM reviewed with pt the importance of daily weights and logging and to take log to MD appointments.     PLAN Follow up with initial home visit next week 11/25/14.  Jacqlyn Larsen [facility], [place] Coordinator [phone]

## 2014-11-17 NOTE — Patient Outreach (Signed)
11/17/14- Care plan for visit dated 11/12/14 is included with 11/17/14 visit note.  Jacqlyn Larsen [facility], [place] Coordinator [phone]

## 2014-11-17 NOTE — Progress Notes (Signed)
Subjective:  Patient ID: Alexa Key, female    DOB: [birth year]  Age: 64 y.o. MRN: [id number]  CC: Hospitalization Follow-up   HPI Alexa Key presents for patient seen at [facility] due to NSTEMI. She underwent cardiac catheterization which is attached and reviewed with the patient showing minimally diminished ejection fraction at 52% Period she had moderate RCA stenosis moderate left circumflex stenosis and mild left anterior descending stenosis and severe stenosis of the small first diagonal branch area medical therapy was recommended. Patient was placed on metoprolol and 325 aspirin therapy. Subsequently she was transferred to [facility] to rehabilitation for skilled nursing and recently was discharged home. She is here today to reestablish outpatient care. She currently is pain-free. She remains somewhat weak. She continues to be tobacco free for 7 weeks. She discontinued cigarettes at the time of her event. History [name] has a past medical history of CAD (coronary artery disease); PVD (peripheral vascular disease); CVA (cerebral infarction); HTN (hypertension); HLD (hyperlipidemia); Tobacco abuse; Diverticulitis (08/04/12); S/P partial lobectomy of lung (1997); Stroke; COPD (chronic obstructive pulmonary disease); Fibromyalgia; PONV (postoperative nausea and vomiting); Anxiety; Diabetes mellitus without complication; GERD (gastroesophageal reflux disease); H/O echocardiogram; and Abnormal cardiac function test (09/22/2014).   She has past surgical history that includes Hysterectomoy; Lumbar spine surgery; Carpal tunnel release; Coronary angioplasty; coronary stents ; partial lobectomy of lung ; Abdominal hysterectomy; Back surgery; Nephrolithotomy (Right, 12/24/2012); Flexible sigmoidoscopy (07/11/2014); laparotomy (N/A, 10/02/2014); and Cardiac catheterization (N/A, 10/15/2014).   Her family history includes Kidney disease in her mother.She reports that she quit smoking about 7 weeks ago. Her  smoking use included Cigarettes. She has a 12.25 pack-year smoking history. She has never used smokeless tobacco. She reports that she does not drink alcohol or use illicit drugs.  Outpatient Prescriptions Prior to Visit  Medication Sig Dispense Refill  . acetaminophen (TYLENOL) 500 MG tablet Take 1,000 mg by mouth every 6 (six) hours as needed for pain.    Marland Kitchen albuterol (PROVENTIL HFA;VENTOLIN HFA) 108 (90 BASE) MCG/ACT inhaler Inhale 2 puffs into the lungs every 6 (six) hours as needed for wheezing or shortness of breath. 1 Inhaler 6  . amLODipine (NORVASC) 10 MG tablet Take 1 tablet (10 mg total) by mouth daily. 90 tablet 1  . aspirin 325 MG tablet Take 325 mg by mouth daily.    . budesonide-formoterol (SYMBICORT) 160-4.5 MCG/ACT inhaler Inhale 2 puffs into the lungs 2 (two) times daily. 1 Inhaler 12  . cholecalciferol (VITAMIN D) 1000 UNITS tablet Take 2 tablets (2,000 Units total) by mouth at bedtime. 60 tablet 5  . isosorbide mononitrate (IMDUR) 30 MG 24 hr tablet Take 1 tablet (30 mg total) by mouth daily. 30 tablet 1  . metoprolol tartrate (LOPRESSOR) 25 MG tablet Take 0.5 tablets (12.5 mg total) by mouth 2 (two) times daily. 30 tablet 1  . omeprazole (PRILOSEC) 20 MG capsule TAKE 1 CAPSULE (20 MG TOTAL)  BY MOUTH DAILY. 30 capsule 4  . polyethylene glycol (MIRALAX / GLYCOLAX) packet Take 17 g by mouth daily as needed for mild constipation. 14 each 0  . pravastatin (PRAVACHOL) 80 MG tablet Take 1 tablet (80 mg total) by mouth at bedtime. 30 tablet 5  . saccharomyces boulardii (FLORASTOR) 250 MG capsule Take 1 capsule (250 mg total) by mouth 2 (two) times daily. 60 capsule 0  . tiZANidine (ZANAFLEX) 2 MG tablet Take 1 tablet (2 mg total) by mouth every 6 (six) hours as needed. 30 tablet 0  .  tiotropium (SPIRIVA) 18 MCG inhalation capsule Place 1 capsule (18 mcg total) into inhaler and inhale daily. (Patient not taking: Reported on 11/17/2014) 30 capsule 12  . traMADol (ULTRAM) 50 MG tablet Take  1 tablet (50 mg total) by mouth every 12 (twelve) hours as needed for moderate pain. (Patient not taking: Reported on 11/17/2014) 60 tablet 0  . zolpidem (AMBIEN) 5 MG tablet Take 1 tablet (5 mg total) by mouth at bedtime as needed for sleep. (Patient not taking: Reported on 11/17/2014) 30 tablet 0   No facility-administered medications prior to visit.    ROS Review of Systems  Constitutional: Negative for fever, chills, diaphoresis, appetite change, fatigue and unexpected weight change.  HENT: Negative for congestion, ear pain, hearing loss, postnasal drip, rhinorrhea, sneezing, sore throat and trouble swallowing.   Eyes: Negative for pain.  Respiratory: Negative for cough, chest tightness and shortness of breath.   Cardiovascular: Negative for chest pain and palpitations.  Gastrointestinal: Negative for nausea, vomiting, abdominal pain, diarrhea and constipation.  Genitourinary: Negative for dysuria, frequency and menstrual problem.  Musculoskeletal: Negative for joint swelling and arthralgias.  Skin: Negative for rash.  Neurological: Negative for dizziness, weakness, numbness and headaches.  Psychiatric/Behavioral: Negative for dysphoric mood and agitation.    Objective:  BP 140/77 mmHg  Pulse 65  Temp(Src) 98.9 F (37.2 C) (Oral)  Ht 5\' 1"  (1.549 m)  Wt 134 lb 3.2 oz (60.873 kg)  BMI 25.37 kg/m2  BP Readings from Last 3 Encounters:  11/17/14 140/77  10/21/14 132/52  09/22/14 158/74    Wt Readings from Last 3 Encounters:  11/17/14 134 lb 3.2 oz (60.873 kg)  10/21/14 134 lb (60.782 kg)  09/22/14 143 lb 6.4 oz (65.046 kg)     Physical Exam  Constitutional: She is oriented to person, place, and time. She appears well-developed and well-nourished. No distress.  HENT:  Head: Normocephalic and atraumatic.  Right Ear: External ear normal.  Left Ear: External ear normal.  Nose: Nose normal.  Mouth/Throat: Oropharynx is clear and moist.  Eyes: Conjunctivae and EOM are  normal. Pupils are equal, round, and reactive to light.  Neck: Normal range of motion. Neck supple. No thyromegaly present.  Cardiovascular: Normal rate, regular rhythm and normal heart sounds.   No murmur heard. Pulmonary/Chest: Effort normal and breath sounds normal. No respiratory distress. She has no wheezes. She has no rales.  Abdominal: Soft. Bowel sounds are normal. She exhibits no distension. There is no tenderness.  Lymphadenopathy:    She has no cervical adenopathy.  Neurological: She is alert and oriented to person, place, and time. She has normal reflexes.  Skin: Skin is warm and dry.  Psychiatric: She has a normal mood and affect. Her behavior is normal. Judgment and thought content normal.    No results found for: HGBA1C  Lab Results  Component Value Date   WBC 8.5 10/20/2014   HGB 10.8* 10/20/2014   HCT 35.0* 10/20/2014   PLT 237 10/20/2014   GLUCOSE 116* 10/21/2014   CHOL 159 01/17/2013   TRIG 182* 01/17/2013   HDL 41 01/17/2013   LDLCALC 82 01/17/2013   ALT 18 09/29/2014   AST 22 09/29/2014   NA 142 10/21/2014   K 3.1* 10/21/2014   CL 103 10/21/2014   CREATININE 1.84* 10/21/2014   BUN 17 10/21/2014   CO2 28 10/21/2014   INR 1.12 09/30/2014    Ct Abdomen Pelvis W Contrast  09/29/2014   CLINICAL DATA:  Lower abdominal pain  EXAM:  CT ABDOMEN AND PELVIS WITH CONTRAST  TECHNIQUE: Multidetector CT imaging of the abdomen and pelvis was performed using the standard protocol following bolus administration of intravenous contrast.  CONTRAST:  1mL OMNIPAQUE IOHEXOL 300 MG/ML SOLN, 81mL OMNIPAQUE IOHEXOL 300 MG/ML SOLN  COMPARISON:  None.  FINDINGS: Lung bases are free of acute infiltrate or sizable effusion.  The liver, gallbladder, spleen, adrenal glands and pancreas are within normal limits. The kidneys are well visualized and demonstrate right renal cystic change stable from the prior exam. Stable renal calculi are noted on the left. 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 It is stable in appearance from the prior exam. Stable scattered atherosclerotic changes are noted throughout the aortoiliac system.  The colon is somewhat distended with air with area of wall thickening and narrowing in the sigmoid colon. This has progressed in the interval from the prior exam. Given findings from recent sigmoidoscopy this likely represents progressive smooth muscle thickening. Clinical correlation is recommended. The bladder is decompressed. No pelvic mass lesion is noted. The bony structures demonstrate postoperative change in the lumbar spine.  IMPRESSION: Stable cystic and calculus changes in the kidneys bilaterally.  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  Increased colonic distention with air secondary to an area of narrowing in the sigmoid colon. This likely represents progressive smooth muscle thickening and narrowing. The need for direct visualization can be determined on a clinical basis.   Electronically Signed   By: Inez Catalina M.D.   On: 09/29/2014 14:07    Assessment & Plan:   Nicolette was seen today for hospitalization follow-up.  Diagnoses and all orders for this visit:  Coronary artery disease involving native coronary artery of native heart with angina pectoris  HLD (hyperlipidemia) Orders: -     pravastatin (PRAVACHOL) 80 MG tablet; Take 1 tablet (80 mg total) by mouth at bedtime.  Essential hypertension   I am having Ms. Bice maintain her aspirin, budesonide-formoterol, acetaminophen, pravastatin, amLODipine, albuterol, omeprazole, cholecalciferol, metoprolol tartrate, tiZANidine, polyethylene glycol, zolpidem, traMADol, tiotropium, isosorbide mononitrate, and saccharomyces boulardii.  No orders of the defined types were placed in this encounter.     Follow-up: Return in about 1 month (around 12/18/2014).  [name], M.D.

## 2014-11-23 MED ORDER — PRAVASTATIN SODIUM 80 MG PO TABS
80.0000 mg | ORAL_TABLET | Freq: Every day | ORAL | Status: DC
Start: 2014-11-23 — End: 2015-11-06

## 2014-11-25 ENCOUNTER — Encounter: Payer: Self-pay | Admitting: *Deleted

## 2014-11-25 ENCOUNTER — Other Ambulatory Visit: Payer: Self-pay | Admitting: *Deleted

## 2014-11-25 NOTE — Patient Outreach (Signed)
[place] [facility]) Care Management   11/25/2014  Alexa Key May 18, 1950 [id number]  Alexa Key is an 64 y.o. female  Subjective: Initial home visit with pt, HIPAA verified, home health RN Juliane Poot present with pt assisting with colostomy care.  Pt states she is weighing but not recording, saw primary MD 11/17/14 and no changes made, states her daughter assists with medications and prefills med box, pt reports she would like to have advanced directive documents to review with her daughter and then decide if she wants to complete.    Objective:   Filed Vitals:   11/25/14 1230  BP: 120/68  Pulse: 68  Resp: 16  Height: 1.549 m (5\' 1" )  Weight: 130 lb (58.968 kg)  SpO2: 98%   ROS   Physical Exam  Constitutional: She is oriented to person, place, and time. She appears well-developed.  HENT:  Head: Normocephalic.  Neck: Normal range of motion.  Cardiovascular: Normal rate and regular rhythm.   Respiratory:  Diminished right upper and lower lobes.  GI: Soft. Bowel sounds are normal.  Pt has colostomy  Musculoskeletal: Normal range of motion.  Neurological: She is alert and oriented to person, place, and time.  Skin: Skin is warm and dry.  Psychiatric: She has a normal mood and affect. Her behavior is normal.    Current Medications:   Current Outpatient Prescriptions  Medication Sig Dispense Refill  . acetaminophen (TYLENOL) 500 MG tablet Take 1,000 mg by mouth every 6 (six) hours as needed for pain.    Marland Kitchen albuterol (PROVENTIL HFA;VENTOLIN HFA) 108 (90 BASE) MCG/ACT inhaler Inhale 2 puffs into the lungs every 6 (six) hours as needed for wheezing or shortness of breath. 1 Inhaler 6  . amLODipine (NORVASC) 10 MG tablet Take 1 tablet (10 mg total) by mouth daily. 90 tablet 1  . aspirin 325 MG tablet Take 325 mg by mouth daily.    . budesonide-formoterol (SYMBICORT) 160-4.5 MCG/ACT inhaler Inhale 2 puffs into the lungs 2 (two) times daily. 1 Inhaler 12  .  cholecalciferol (VITAMIN D) 1000 UNITS tablet Take 2 tablets (2,000 Units total) by mouth at bedtime. 60 tablet 5  . isosorbide mononitrate (IMDUR) 30 MG 24 hr tablet Take 1 tablet (30 mg total) by mouth daily. 30 tablet 1  . metoprolol tartrate (LOPRESSOR) 25 MG tablet Take 0.5 tablets (12.5 mg total) by mouth 2 (two) times daily. 30 tablet 1  . omeprazole (PRILOSEC) 20 MG capsule TAKE 1 CAPSULE (20 MG TOTAL)  BY MOUTH DAILY. 30 capsule 4  . polyethylene glycol (MIRALAX / GLYCOLAX) packet Take 17 g by mouth daily as needed for mild constipation. 14 each 0  . pravastatin (PRAVACHOL) 80 MG tablet Take 1 tablet (80 mg total) by mouth at bedtime. 90 tablet 3  . saccharomyces boulardii (FLORASTOR) 250 MG capsule Take 1 capsule (250 mg total) by mouth 2 (two) times daily. 60 capsule 0  . tiotropium (SPIRIVA) 18 MCG inhalation capsule Place 1 capsule (18 mcg total) into inhaler and inhale daily. (Patient not taking: Reported on 11/17/2014) 30 capsule 12  . tiZANidine (ZANAFLEX) 2 MG tablet Take 1 tablet (2 mg total) by mouth every 6 (six) hours as needed. 30 tablet 0  . traMADol (ULTRAM) 50 MG tablet Take 1 tablet (50 mg total) by mouth every 12 (twelve) hours as needed for moderate pain. (Patient not taking: Reported on 11/17/2014) 60 tablet 0  . zolpidem (AMBIEN) 5 MG tablet Take 1 tablet (5 mg total)  by mouth at bedtime as needed for sleep. (Patient not taking: Reported on 11/17/2014) 30 tablet 0   No current facility-administered medications for this visit.    Functional Status:   In your present state of health, do you have any difficulty performing the following activities: 11/25/2014 09/30/2014  Hearing? N -  Vision? N -  Difficulty concentrating or making decisions? N -  Walking or climbing stairs? N Y  Dressing or bathing? - -  Doing errands, shopping? - -    Fall/Depression Screening:    PHQ 2/9 Scores 11/25/2014 11/17/2014 05/08/2014  PHQ - 2 Score 0 0 0   Fall Risk  11/25/2014 11/17/2014  05/08/2014 10/16/2013  Falls in the past year? No No No No    Assessment:  RN CM collaborated with home health RN Juliane Poot who is providing wound care and colostomy care, RN CM did not observe wound as dressing intact per pt, colostomy bag intact and functioning without problems.   RN CM gave pt [facility] calendar, medication box, CHF and COPD materials and zones chart, reviewed EMMI handouts, RN CM called [employer], spoke with [facility] and ask about frozen meals for pt, per [facility] they will have 10 meals delivered to patient's home by this Friday 11/28/14, pt says this will help her greatly.  RN CM focus is management and self care related to CHF and COPD, medication management.  RN CM observed all medication bottles and reviewed with pt. (purpose, side effects, dosage). RN CM faxed initial home visit and barrier letter to primary  MD Dr. Livia Snellen.  [facility] CM Care Plan Problem One        Patient Outreach from 11/25/2014 in [place] Problem One  knowledge deficit related to CHF   Care Plan for Problem One  Active   [facility] Long Term Goal (31-90 days)  pt will have no readmissions related to CHF within 90 days.   [facility] Long Term Goal Start Date  11/12/14   Interventions for Problem One Long Term Goal   RN CM reinforced with pt- call for 3 pound weight gain overnight or 5 pounds in one week. RN CM gave pt "Living Better With Heart Failure" packet and reviewed CHF booklet and ask pt to read.   [facility] CM Short Term Goal #2 (0-30 days)  pt will consistently weigh daily and record within 30 days. [pt is weighing but not recording]   [facility] CM Short Term Goal #2 Start Date  11/17/14   Interventions for Short Term Goal #2  RN CM reinforced with pt the importance of daily weights and logging and to take log to MD appointments.    [facility] CM Care Plan Problem Two        Patient Outreach from 11/25/2014 in [place] Problem Two  Knowledge deficit related to COPD   Care  Plan for Problem Two  Active   Interventions for Problem Two Long Term Goal   RN CM gave pt COPD Action Plan magnet and reviewed with pt.   [facility] Long Term Goal (31-90) days  pt will have no readmissions related to COPD within 90 days   [facility] Long Term Goal Start Date  11/25/14   [facility] CM Short Term Goal #1 (0-30 days)  pt will verbalize signs/ symptoms of COPD exacerbation and actions to take within 30 days   [facility] CM Short Term Goal #1 Start Date  11/25/14   Interventions for Short  Term Goal #2   RN CM reviewed signs/ symptoms of beginning COPD exacerbation and importance of calling MD, RN CM , home health early for change in health status/ symptoms, reviewed when to call 911, Reviewed avoiding extremes of temperature outside especially with hot weather, reviewed taking medications as prescribed.      Plan: follow up with home visit 12/23/14 Assess weight log Collaborate with home health as needed  Jacqlyn Larsen [facility], [place] Coordinator [phone]

## 2014-12-01 ENCOUNTER — Ambulatory Visit (INDEPENDENT_AMBULATORY_CARE_PROVIDER_SITE_OTHER): Payer: Commercial Managed Care - HMO | Admitting: Physician Assistant

## 2014-12-01 ENCOUNTER — Encounter: Payer: Self-pay | Admitting: Physician Assistant

## 2014-12-01 VITALS — BP 116/62 | HR 67 | Ht 61.0 in | Wt 133.3 lb

## 2014-12-01 DIAGNOSIS — G458 Other transient cerebral ischemic attacks and related syndromes: Secondary | ICD-10-CM

## 2014-12-01 NOTE — Patient Instructions (Signed)
Your physician recommends that you schedule a follow-up appointment in: 5 months with Dr. Percival Spanish. Continue all medications as prescribed.

## 2014-12-01 NOTE — Progress Notes (Signed)
Patient ID: Alexa Key, female   DOB: [birth year], 64 y.o.   MRN: [id number]    Date:  12/01/2014   ID:  Alexa Key, DOB Dec 24, 1950, MRN [id number]  PCP:  [name], MD  Primary Cardiologist:  [facility]  Chief Complaint  Patient presents with  . Edema    bilateral ankles  . Follow-up    CATH on 10/15/14     History of Present Illness: Alexa Key is a 64 y.o. female with history of coronary artery disease and is status post MI in April 2014. In May 2016 she had a Agricultural consultant with an ejection fraction of 45-50% was intermediate risk. 10/15/2014 she underwent left heart catheterization revealing diffuse calcified coronary disease with moderate RCA stenosis, moderate circumflex stenosis mild LAD stenosis and severe stenosis of a small first diagonal branch. Medical therapy was recommended. 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 Kellie Simmering), CVA, hypertension, hyperlipidemia, tobacco abuse, COPD, fibromyalgia, stage III chronic kidney disease, diabetes mellitus, GERD.  Patient presents today for posthospital follow-up. She reports doing well. She has quit smoking and is watching her sodium intake. He monitors her weight daily. She is hoping to have her colostomy reversed in 3-6 months.  She has not seen Dr. Kellie Simmering since 2004.  The patient currently denies nausea, vomiting, fever, chest pain, shortness of breath, orthopnea, dizziness, PND, cough, congestion, abdominal pain, hematochezia, melena, lower extremity edema, claudication.  Diagnostic Diagram 10/15/2014           Wt Readings from Last 3 Encounters:  12/01/14 133 lb 4.8 oz (60.464 kg)  11/25/14 130 lb (58.968 kg)  11/17/14 134 lb 3.2 oz (60.873 kg)     Past Medical History  Diagnosis Date  . CAD (coronary artery disease)     a. 07/2012 s/p MI ->not cath  candidate 2/2 AKI; b. 09/2012 Neg MV;  c. 08/2014 [facility] CL: EF 45-54%, + ischemia, intermittent risk study.  Marland Kitchen PVD (peripheral vascular disease)   . CVA (cerebral infarction)     TIAx2  . HTN (hypertension)   . HLD (hyperlipidemia)   . Tobacco abuse   . Diverticulitis 08/04/12  . S/P partial lobectomy of lung 1997    per [facility], incidental finding, path c/w benign lesion  . Stroke   . COPD (chronic obstructive pulmonary disease)   . Fibromyalgia   . PONV (postoperative nausea and vomiting)   . Anxiety   . Diabetes mellitus without complication     on no meds   . GERD (gastroesophageal reflux disease)   . H/O echocardiogram     a. 07/2012 Echo: EF 50-55%, grade 2 DD, mild LVH.  Marland Kitchen Abnormal cardiac function test 09/22/2014    Current Outpatient Prescriptions  Medication Sig Dispense Refill  . acetaminophen (TYLENOL) 500 MG tablet Take 1,000 mg by mouth every 6 (six) hours as needed for pain.    Marland Kitchen albuterol (PROVENTIL HFA;VENTOLIN HFA) 108 (90 BASE) MCG/ACT inhaler Inhale 2 puffs into the lungs every 6 (six) hours as needed for wheezing or shortness of breath. 1 Inhaler 6  . amLODipine (NORVASC) 10 MG tablet Take 1 tablet (10 mg total) by mouth daily. 90 tablet 1  . aspirin 325 MG tablet Take 325 mg by mouth daily.    . budesonide-formoterol (SYMBICORT) 160-4.5 MCG/ACT inhaler Inhale 2 puffs into the lungs 2 (two) times daily. 1 Inhaler 12  . cholecalciferol (VITAMIN D) 1000  UNITS tablet Take 2 tablets (2,000 Units total) by mouth at bedtime. 60 tablet 5  . isosorbide mononitrate (IMDUR) 30 MG 24 hr tablet Take 1 tablet (30 mg total) by mouth daily. 30 tablet 1  . metoprolol tartrate (LOPRESSOR) 25 MG tablet Take 0.5 tablets (12.5 mg total) by mouth 2 (two) times daily. 30 tablet 1  . omeprazole (PRILOSEC) 20 MG capsule TAKE 1 CAPSULE (20 MG TOTAL)  BY MOUTH DAILY. 30 capsule 4  . polyethylene glycol (MIRALAX / GLYCOLAX) packet Take 17 g by mouth daily as needed for mild constipation. 14 each  0  . pravastatin (PRAVACHOL) 80 MG tablet Take 1 tablet (80 mg total) by mouth at bedtime. 90 tablet 3  . tiotropium (SPIRIVA) 18 MCG inhalation capsule Place 1 capsule (18 mcg total) into inhaler and inhale daily. 30 capsule 12  . tiZANidine (ZANAFLEX) 2 MG tablet Take 1 tablet (2 mg total) by mouth every 6 (six) hours as needed. 30 tablet 0   No current facility-administered medications for this visit.    Allergies:    Allergies  Allergen Reactions  . Penicillins Rash  . Sulfa Antibiotics Rash    Social History:  The patient  reports that she quit smoking about 2 months ago. Her smoking use included Cigarettes. She has a 12.25 pack-year smoking history. She has never used smokeless tobacco. She reports that she does not drink alcohol or use illicit drugs.   Family history:   Family History  Problem Relation Age of Onset  . Kidney disease Mother     ROS:  Please see the history of present illness.  All other systems reviewed and negative.   PHYSICAL EXAM: VS:  BP 116/62 mmHg  Pulse 67  Ht 5\' 1"  (1.549 m)  Wt 133 lb 4.8 oz (60.464 kg)  BMI 25.20 kg/m2 Well nourished, well developed, in no acute distress HEENT: Pupils are equal round react to light accommodation extraocular movements are intact.  Neck: no JVDNo cervical lymphadenopathy. No bruits heard in the neck are likely radiated from the aortic murmur. Cardiac: Regular rate and rhythm 1/6 systolic murmur RSB . Lungs:  clear to auscultation bilaterally, no wheezing, rhonchi or rales Abd: soft, nontender, positive bowel sounds all quadrants, no hepatosplenomegaly Ext: no lower extremity edema.  2+ radial and dorsalis pedis pulses. Skin: warm and dry Neuro:  Grossly normal    ASSESSMENT AND PLAN:  Coronary artery disease No complaints of angina. I reviewed cath report with the patient and her daughter.  Chronic diastolic heart failure Patient appears euvolemic. She's not required any diaphoretic. We reviewed daily  weight monitoring and low-sodium diet.  Essential hypertension Blood pressures well-controlled.  Peripheral vascular disease excellent No complaints of dizziness. She has good pulses distally and in both arms. No complaints of claudication.  Tobacco abuse  She has been quit since May.

## 2014-12-02 ENCOUNTER — Other Ambulatory Visit: Payer: Self-pay | Admitting: *Deleted

## 2014-12-02 NOTE — Patient Outreach (Signed)
12/02/14- Telephone call to patient for transition of care week 4, spoke with pt and HIPAA verified, pt reports home health continues to see her, weight today 131 pounds, [employer] delivered meals to patient's home, pt has all medications and taking as prescribed, no new concerns or issues reported.  PLAN See pt for scheduled home visit 12/23/14  Jacqlyn Larsen [facility], [place] Coordinator [phone]

## 2014-12-15 ENCOUNTER — Other Ambulatory Visit: Payer: Self-pay | Admitting: Surgery

## 2014-12-15 DIAGNOSIS — Z933 Colostomy status: Secondary | ICD-10-CM

## 2014-12-22 ENCOUNTER — Encounter: Payer: Self-pay | Admitting: Family Medicine

## 2014-12-22 ENCOUNTER — Ambulatory Visit (INDEPENDENT_AMBULATORY_CARE_PROVIDER_SITE_OTHER): Payer: Commercial Managed Care - HMO

## 2014-12-22 ENCOUNTER — Ambulatory Visit (INDEPENDENT_AMBULATORY_CARE_PROVIDER_SITE_OTHER): Payer: Commercial Managed Care - HMO | Admitting: Family Medicine

## 2014-12-22 VITALS — BP 134/64 | HR 53 | Temp 97.6°F | Ht 61.0 in | Wt 133.8 lb

## 2014-12-22 DIAGNOSIS — Z780 Asymptomatic menopausal state: Secondary | ICD-10-CM

## 2014-12-22 DIAGNOSIS — J439 Emphysema, unspecified: Secondary | ICD-10-CM | POA: Diagnosis not present

## 2014-12-22 DIAGNOSIS — K219 Gastro-esophageal reflux disease without esophagitis: Secondary | ICD-10-CM | POA: Diagnosis not present

## 2014-12-22 DIAGNOSIS — N183 Chronic kidney disease, stage 3 unspecified: Secondary | ICD-10-CM

## 2014-12-22 DIAGNOSIS — E785 Hyperlipidemia, unspecified: Secondary | ICD-10-CM

## 2014-12-22 DIAGNOSIS — I10 Essential (primary) hypertension: Secondary | ICD-10-CM | POA: Diagnosis not present

## 2014-12-22 MED ORDER — AMLODIPINE BESYLATE 10 MG PO TABS: 10.0000 mg | ORAL_TABLET | Freq: Every day | ORAL | Status: DC

## 2014-12-22 MED ORDER — METOPROLOL TARTRATE 25 MG PO TABS: 12.5000 mg | ORAL_TABLET | Freq: Two times a day (BID) | ORAL | Status: DC

## 2014-12-22 NOTE — Progress Notes (Signed)
Subjective:  Patient ID: Alexa Key, female    DOB: [birth year]  Age: 64 y.o. MRN: [id number]  CC: Coronary Artery Disease; Hyperlipidemia; and Hypertension   HPI Alexa Key presents for  follow-up of hypertension. Patient has no history of headache chest pain from her CAD. Denies recent cough. Patient also denies symptoms of TIA such as numbness weakness or lateralizing deficit. Patient checks  blood pressure at home and has not had any elevated readings recently. Patient denies side effects from her medication. States taking it regularly.  Patient also  in for follow-up of elevated cholesterol. Doing well without complaints on current medication. Denies side effects of statin including myalgia and arthralgia and nausea. Also in today for liver function testing. Currently no chest pain, or other cardiovascular related symptoms noted.  Regarding COPD, Some DOE noted, but remains comfortable mostly as long as she uses inhalers listed below. This in spite of low dose metoprolol use for her CAD. She is asymptomatic from her renal insufficiency, but due for recheck of renal function due to meds.    History Ashyah has a past medical history of CAD (coronary artery disease); PVD (peripheral vascular disease); CVA (cerebral infarction); HTN (hypertension); HLD (hyperlipidemia); Tobacco abuse; Diverticulitis (08/04/12); S/P partial lobectomy of lung (1997); Stroke; COPD (chronic obstructive pulmonary disease); Fibromyalgia; PONV (postoperative nausea and vomiting); Anxiety; Diabetes mellitus without complication; GERD (gastroesophageal reflux disease); H/O echocardiogram; Abnormal cardiac function test (09/22/2014); and Subclavian steal syndrome (2004).   She has past surgical history that includes Hysterectomoy; Lumbar spine surgery; Carpal tunnel release; Coronary angioplasty; coronary stents ; partial lobectomy of lung  (1998); Abdominal hysterectomy; Back surgery; Nephrolithotomy (Right, 12/24/2012);  Flexible sigmoidoscopy (07/11/2014); laparotomy (N/A, 10/02/2014); and Cardiac catheterization (N/A, 10/15/2014).   Her family history includes Kidney disease in her mother.She reports that she quit smoking about 2 months ago. Her smoking use included Cigarettes. She has a 12.25 pack-year smoking history. She has never used smokeless tobacco. She reports that she does not drink alcohol or use illicit drugs.  Current Outpatient Prescriptions on File Prior to Visit  Medication Sig Dispense Refill  . acetaminophen (TYLENOL) 500 MG tablet Take 1,000 mg by mouth every 6 (six) hours as needed for pain.    Marland Kitchen albuterol (PROVENTIL HFA;VENTOLIN HFA) 108 (90 BASE) MCG/ACT inhaler Inhale 2 puffs into the lungs every 6 (six) hours as needed for wheezing or shortness of breath. 1 Inhaler 6  . aspirin 325 MG tablet Take 325 mg by mouth daily.    . budesonide-formoterol (SYMBICORT) 160-4.5 MCG/ACT inhaler Inhale 2 puffs into the lungs 2 (two) times daily. 1 Inhaler 12  . cholecalciferol (VITAMIN D) 1000 UNITS tablet Take 2 tablets (2,000 Units total) by mouth at bedtime. 60 tablet 5  . isosorbide mononitrate (IMDUR) 30 MG 24 hr tablet Take 1 tablet (30 mg total) by mouth daily. 30 tablet 1  . omeprazole (PRILOSEC) 20 MG capsule TAKE 1 CAPSULE (20 MG TOTAL)  BY MOUTH DAILY. 30 capsule 4  . polyethylene glycol (MIRALAX / GLYCOLAX) packet Take 17 g by mouth daily as needed for mild constipation. 14 each 0  . pravastatin (PRAVACHOL) 80 MG tablet Take 1 tablet (80 mg total) by mouth at bedtime. 90 tablet 3  . tiotropium (SPIRIVA) 18 MCG inhalation capsule Place 1 capsule (18 mcg total) into inhaler and inhale daily. 30 capsule 12  . tiZANidine (ZANAFLEX) 2 MG tablet Take 1 tablet (2 mg total) by mouth every 6 (six) hours as needed. [place]  tablet 0   No current facility-administered medications on file prior to visit.    ROS Review of Systems  Constitutional: Positive for fatigue. Negative for fever, chills, diaphoresis,  appetite change and unexpected weight change.  HENT: Negative for congestion, ear pain, hearing loss, postnasal drip, rhinorrhea, sneezing, sore throat and trouble swallowing.   Eyes: Negative for pain.  Respiratory: Negative for cough and chest tightness.   Cardiovascular: Negative for chest pain and palpitations.  Gastrointestinal: Negative for nausea, vomiting, abdominal pain, diarrhea and constipation.  Genitourinary: Negative for dysuria, frequency and menstrual problem.  Musculoskeletal: Negative for joint swelling and arthralgias.  Skin: Negative for rash.  Neurological: Negative for dizziness, weakness, numbness and headaches.  Psychiatric/Behavioral: Negative for dysphoric mood and agitation.    Objective:  BP 134/64 mmHg  Pulse 53  Temp(Src) 97.6 F (36.4 C) (Oral)  Ht '5\' 1"'  (1.549 m)  Wt 133 lb 12.8 oz (60.691 kg)  BMI 25.29 kg/m2  BP Readings from Last 3 Encounters:  12/23/14 118/62  12/22/14 134/64  12/01/14 116/62    Wt Readings from Last 3 Encounters:  12/23/14 131 lb (59.421 kg)  12/22/14 133 lb 12.8 oz (60.691 kg)  12/01/14 133 lb 4.8 oz (60.464 kg)     Physical Exam  Constitutional: She is oriented to person, place, and time. She appears well-developed and well-nourished. No distress.  HENT:  Head: Normocephalic and atraumatic.  Right Ear: External ear normal.  Left Ear: External ear normal.  Nose: Nose normal.  Mouth/Throat: Oropharynx is clear and moist.  Eyes: Conjunctivae and EOM are normal. Pupils are equal, round, and reactive to light.  Neck: Normal range of motion. Neck supple. No thyromegaly present.  Cardiovascular: Normal rate, regular rhythm and normal heart sounds.   No murmur heard. Pulmonary/Chest: Effort normal and breath sounds normal. No respiratory distress. She has no wheezes. She has no rales.  Abdominal: Soft. Bowel sounds are normal. She exhibits no distension. There is no tenderness.  Lymphadenopathy:    She has no cervical  adenopathy.  Neurological: She is alert and oriented to person, place, and time. She has normal reflexes.  Skin: Skin is warm and dry.  Psychiatric: She has a normal mood and affect. Her behavior is normal. Judgment and thought content normal.    No results found for: HGBA1C  Lab Results  Component Value Date   WBC 9.7 12/22/2014   HGB 10.8* 10/20/2014   HCT 35.3 12/22/2014   PLT 237 10/20/2014   GLUCOSE 93 12/22/2014   CHOL 155 12/22/2014   TRIG 188* 12/22/2014   HDL 36* 12/22/2014   LDLCALC 81 12/22/2014   ALT 8 12/22/2014   AST 9 12/22/2014   NA 144 12/22/2014   K 4.3 12/22/2014   CL 107 12/22/2014   CREATININE 1.28* 12/22/2014   BUN 13 12/22/2014   CO2 21 12/22/2014   TSH 1.920 12/22/2014   INR 1.12 09/30/2014    Ct Abdomen Pelvis W Contrast  09/29/2014   CLINICAL DATA:  Lower abdominal pain  EXAM: CT ABDOMEN AND PELVIS WITH CONTRAST  TECHNIQUE: Multidetector CT imaging of the abdomen and pelvis was performed using the standard protocol following bolus administration of intravenous contrast.  CONTRAST:  41m OMNIPAQUE IOHEXOL 300 MG/ML SOLN, 810mOMNIPAQUE IOHEXOL 300 MG/ML SOLN  COMPARISON:  None.  FINDINGS: Lung bases are free of acute infiltrate or sizable effusion.  The liver, gallbladder, spleen, adrenal glands and pancreas are within normal limits. The kidneys are well visualized and demonstrate right  renal cystic change stable from the prior exam. Stable renal calculi are noted on the left. 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 It is stable in appearance from the prior exam. Stable scattered atherosclerotic changes are noted throughout the aortoiliac system.  The colon is somewhat distended with air with area of wall thickening and narrowing in the sigmoid colon. This has progressed in the interval from the prior exam. Given findings from recent sigmoidoscopy this likely represents progressive smooth muscle  thickening. Clinical correlation is recommended. The bladder is decompressed. No pelvic mass lesion is noted. The bony structures demonstrate postoperative change in the lumbar spine.  IMPRESSION: Stable cystic and calculus changes in the kidneys bilaterally.  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  Increased colonic distention with air secondary to an area of narrowing in the sigmoid colon. This likely represents progressive smooth muscle thickening and narrowing. The need for direct visualization can be determined on a clinical basis.   Electronically Signed   By: Inez Catalina M.D.   On: 09/29/2014 14:07    Assessment & Plan:   Mita was seen today for coronary artery disease, hyperlipidemia and hypertension.  Diagnoses and all orders for this visit:  Essential hypertension -     amLODipine (NORVASC) 10 MG tablet; Take 1 tablet (10 mg total) by mouth daily. -     CMP14+EGFR  Dyslipidemia -     CMP14+EGFR -     Lipid panel  CKD (chronic kidney disease) stage 3, GFR 30-59 ml/min -     CMP14+EGFR  Gastroesophageal reflux disease without esophagitis -     CBC with Differential/Platelet  Postmenopausal -     CBC with Differential/Platelet -     TSH -     Vit D  25 hydroxy (rtn osteoporosis monitoring)  Pulmonary emphysema, unspecified emphysema type -     PR BREATHING CAPACITY TEST -     DG Chest 2 View; Future  Other orders -     metoprolol tartrate (LOPRESSOR) 25 MG tablet; Take 0.5 tablets (12.5 mg total) by mouth 2 (two) times daily. -     CBC with Differential/Platelet   I am having Ms. Challis maintain her aspirin, budesonide-formoterol, acetaminophen, albuterol, omeprazole, cholecalciferol, tiZANidine, polyethylene glycol, tiotropium, isosorbide mononitrate, pravastatin, metoprolol tartrate, and amLODipine.  Meds ordered this encounter  Medications  . metoprolol tartrate (LOPRESSOR) 25 MG tablet    Sig: Take 0.5 tablets (12.5 mg  total) by mouth 2 (two) times daily.    Dispense:  90 tablet    Refill:  1  . amLODipine (NORVASC) 10 MG tablet    Sig: Take 1 tablet (10 mg total) by mouth daily.    Dispense:  90 tablet    Refill:  1   Currently her metoprolol is not having any significant negative impact on COPD and is useful for her CAD. Will continue to monitor.  Follow-up: Return in about 3 months (around 03/24/2015).  [name], M.D.

## 2014-12-23 ENCOUNTER — Other Ambulatory Visit: Payer: Self-pay | Admitting: Family Medicine

## 2014-12-23 ENCOUNTER — Encounter: Payer: Self-pay | Admitting: *Deleted

## 2014-12-23 ENCOUNTER — Other Ambulatory Visit: Payer: Self-pay | Admitting: *Deleted

## 2014-12-23 LAB — CBC WITH DIFFERENTIAL/PLATELET
BASOS ABS: 0.1 10*3/uL (ref 0.0–0.2)
Basos: 1 %
EOS (ABSOLUTE): 0.3 10*3/uL (ref 0.0–0.4)
Eos: 3 %
HEMATOCRIT: 35.3 % (ref 34.0–46.6)
Hemoglobin: 11.2 g/dL (ref 11.1–15.9)
Immature Grans (Abs): 0 10*3/uL (ref 0.0–0.1)
Immature Granulocytes: 0 %
LYMPHS ABS: 2.5 10*3/uL (ref 0.7–3.1)
Lymphs: 26 %
MCH: 30.3 pg (ref 26.6–33.0)
MCHC: 31.7 g/dL (ref 31.5–35.7)
MCV: 95 fL (ref 79–97)
MONOCYTES: 5 %
Monocytes Absolute: 0.5 10*3/uL (ref 0.1–0.9)
NEUTROS PCT: 65 %
Neutrophils Absolute: 6.3 10*3/uL (ref 1.4–7.0)
Platelets: 310 10*3/uL (ref 150–379)
RBC: 3.7 x10E6/uL — AB (ref 3.77–5.28)
RDW: 16.2 % — ABNORMAL HIGH (ref 12.3–15.4)
WBC: 9.7 10*3/uL (ref 3.4–10.8)

## 2014-12-23 LAB — CMP14+EGFR
ALBUMIN: 3.7 g/dL (ref 3.6–4.8)
ALK PHOS: 91 IU/L (ref 39–117)
ALT: 8 IU/L (ref 0–32)
AST: 9 IU/L (ref 0–40)
Albumin/Globulin Ratio: 1.3 (ref 1.1–2.5)
BUN/Creatinine Ratio: 10 — ABNORMAL LOW (ref 11–26)
BUN: 13 mg/dL (ref 8–27)
CO2: 21 mmol/L (ref 18–29)
CREATININE: 1.28 mg/dL — AB (ref 0.57–1.00)
Calcium: 9 mg/dL (ref 8.7–10.3)
Chloride: 107 mmol/L (ref 97–108)
GFR calc Af Amer: 50 mL/min/{1.73_m2} — ABNORMAL LOW (ref >59)
GFR calc non Af Amer: 44 mL/min/{1.73_m2} — ABNORMAL LOW (ref >59)
GLOBULIN, TOTAL: 2.9 g/dL (ref 1.5–4.5)
Glucose: 93 mg/dL (ref 65–99)
Potassium: 4.3 mmol/L (ref 3.5–5.2)
SODIUM: 144 mmol/L (ref 134–144)
Total Protein: 6.6 g/dL (ref 6.0–8.5)

## 2014-12-23 LAB — LIPID PANEL
CHOLESTEROL TOTAL: 155 mg/dL (ref 100–199)
Chol/HDL Ratio: 4.3 ratio units (ref 0.0–4.4)
HDL: 36 mg/dL — ABNORMAL LOW (ref >39)
LDL CALC: 81 mg/dL (ref 0–99)
Triglycerides: 188 mg/dL — ABNORMAL HIGH (ref 0–149)
VLDL Cholesterol Cal: 38 mg/dL (ref 5–40)

## 2014-12-23 LAB — VITAMIN D 25 HYDROXY (VIT D DEFICIENCY, FRACTURES): VIT D 25 HYDROXY: 22.6 ng/mL — AB (ref 30.0–100.0)

## 2014-12-23 LAB — TSH: TSH: 1.92 u[IU]/mL (ref 0.450–4.500)

## 2014-12-23 MED ORDER — VITAMIN D (ERGOCALCIFEROL) 1.25 MG (50000 UNIT) PO CAPS: 50000.0000 [IU] | ORAL_CAPSULE | ORAL | Status: DC

## 2014-12-23 NOTE — Patient Outreach (Signed)
[facility]) Care Management   12/23/2014  Alexa Key [birth year] [id number]  Alexa Key is an 63 y.o. female  Subjective: Initial home visit with pt, HIPAA verified, pt reports home health continues working with her on colostomy care, pt states "can you tell me where to get my colostomy supplies when they discharge me?"  Pt reports she saw primary MD 12/22/14 and had blood work, chest xray and breathing treatment, saw cardiologist last Monday, denies any changes made with medications, etc.    Objective:   Filed Vitals:   12/23/14 1209  BP: 118/62  Pulse: 58  Resp: 16  Weight: 131 lb (59.421 kg)  SpO2: 96%   ROS  Physical Exam  Constitutional: She is oriented to person, place, and time. She appears well-developed and well-nourished.  HENT:  Head: Normocephalic.  Neck: Normal range of motion.  Cardiovascular: Regular rhythm.   Respiratory: Effort normal and breath sounds normal.  Right upper and lower lobes slightly diminished  GI: Soft. Bowel sounds are normal.  Musculoskeletal: Normal range of motion. She exhibits no edema.  Neurological: She is alert and oriented to person, place, and time.  Skin: Skin is warm and dry.  Psychiatric: She has a normal mood and affect. Her behavior is normal. Judgment and thought content normal.    Current Medications:   Current Outpatient Prescriptions  Medication Sig Dispense Refill  . acetaminophen (TYLENOL) 500 MG tablet Take 1,000 mg by mouth every 6 (six) hours as needed for pain.    Marland Kitchen albuterol (PROVENTIL HFA;VENTOLIN HFA) 108 (90 BASE) MCG/ACT inhaler Inhale 2 puffs into the lungs every 6 (six) hours as needed for wheezing or shortness of breath. 1 Inhaler 6  . amLODipine (NORVASC) 10 MG tablet Take 1 tablet (10 mg total) by mouth daily. 90 tablet 1  . aspirin 325 MG tablet Take 325 mg by mouth daily.    . budesonide-formoterol (SYMBICORT) 160-4.5 MCG/ACT inhaler Inhale 2 puffs into the lungs 2 (two) times  daily. 1 Inhaler 12  . cholecalciferol (VITAMIN D) 1000 UNITS tablet Take 2 tablets (2,000 Units total) by mouth at bedtime. 60 tablet 5  . isosorbide mononitrate (IMDUR) 30 MG 24 hr tablet Take 1 tablet (30 mg total) by mouth daily. 30 tablet 1  . metoprolol tartrate (LOPRESSOR) 25 MG tablet Take 0.5 tablets (12.5 mg total) by mouth 2 (two) times daily. 90 tablet 1  . omeprazole (PRILOSEC) 20 MG capsule TAKE 1 CAPSULE (20 MG TOTAL)  BY MOUTH DAILY. 30 capsule 4  . polyethylene glycol (MIRALAX / GLYCOLAX) packet Take 17 g by mouth daily as needed for mild constipation. 14 each 0  . pravastatin (PRAVACHOL) 80 MG tablet Take 1 tablet (80 mg total) by mouth at bedtime. 90 tablet 3  . tiotropium (SPIRIVA) 18 MCG inhalation capsule Place 1 capsule (18 mcg total) into inhaler and inhale daily. 30 capsule 12  . tiZANidine (ZANAFLEX) 2 MG tablet Take 1 tablet (2 mg total) by mouth every 6 (six) hours as needed. 30 tablet 0  . Vitamin D, Ergocalciferol, (DRISDOL) 50000 UNITS CAPS capsule Take 1 capsule (50,000 Units total) by mouth 2 (two) times a week. 16 capsule 0   No current facility-administered medications for this visit.    Functional Status:   In your present state of health, do you have any difficulty performing the following activities: 11/25/2014 09/30/2014  Hearing? N -  Vision? N -  Difficulty concentrating or making decisions? N -  Walking or  climbing stairs? N Y  Dressing or bathing? N -  Doing errands, shopping? N -  Preparing Food and eating ? N -  Using the Toilet? N -  In the past six months, have you accidently leaked urine? N -  Do you have problems with loss of bowel control? (No Data) -  Managing your Medications? Y -  Managing your Finances? N -  Housekeeping or managing your Housekeeping? N -    Fall/Depression Screening:    PHQ 2/9 Scores 12/22/2014 11/25/2014 11/17/2014 05/08/2014  PHQ - 2 Score 0 0 0 0    Assessment:  RN CM reiterated CHF/ COPD zones and action plan,  reviewed medications, discussed discharge plan with pt.    [facility] CM Care Plan Problem One        Patient Outreach from 12/23/2014 in [place] Problem One  knowledge deficit related to CHF   Care Plan for Problem One  Active   [facility] Long Term Goal (31-90 days)  pt will have no readmissions related to CHF within 90 days.   [facility] Long Term Goal Start Date  11/12/14   Interventions for Problem One Long Term Goal   RN CM reviewed with pt- call for 3 pound weight gain overnight or 5 pounds in one week.    [facility] CM Short Term Goal #2 (0-30 days)  pt will consistently weigh daily and record within 30 days. [pt is weighing but not recording]   [facility] CM Short Term Goal #2 Start Date  12/23/14 [goal restarted, weighing/ not recording]   Interventions for Short Term Goal #2  RN CM reiterated with pt the importance of daily weights and logging and to take log to MD appointments.    [facility] CM Care Plan Problem Two        Patient Outreach from 12/23/2014 in [place] Problem Two  Knowledge deficit related to COPD   Care Plan for Problem Two  Active   Interventions for Problem Two Long Term Goal   RN CM reinforced COPD Action Plan and reviewed with pt.   [facility]N Long Term Goal (31-90) days  pt will have no readmissions related to COPD within 90 days   [facility] Long Term Goal Start Date  11/25/14   [facility] CM Short Term Goal #1 (0-30 days)  pt will verbalize signs/ symptoms of COPD exacerbation and actions to take within 30 days   [facility] CM Short Term Goal #1 Start Date  11/25/14   [facility] CM Short Term Goal #1 Met Date   12/23/14   Interventions for Short Term Goal #2   RN CM reinforced signs/ symptoms of beginning COPD exacerbation and importance of calling MD, RN CM , home health early for change in health status/ symptoms, reviewed when to call 911, Reviewed avoiding extremes of temperature outside especially with hot weather, reviewed taking medications as prescribed.    [facility] CM  Care Plan Problem Three        Patient Outreach from 12/23/2014 in [place] Problem Three  knowledge deficit related to resources for colostomy supplies   Care Plan for Problem Three  Active   [facility] CM Short Term Goal #1 (0-30 days)  pt will have resource in place for ordering colostomy supplies and be able to order them on her own within 30 days.   [facility] CM Short Term Goal #1 Start Date  12/23/14   Interventions for  Short Term Goal #1  RN CM gave pt 2 resources to obtain colostomy supplies, [employer] and [employer] and gave her the phone numbers, also told pt there are other online mail order companies to choose from.  RN CM called [facility], spoke with [employer] [facility] [employer] RN and ask that she make sure primary RN is aware pt needs assistance with ordering correct colostomy supplies before discharge.      Plan: follow up with home visit 01/20/15 Assess weight log Collaborate with home health as needed Discharge if no other needs  Jacqlyn Larsen [facility], [place] Coordinator [phone]

## 2014-12-30 ENCOUNTER — Other Ambulatory Visit: Payer: Self-pay | Admitting: General Surgery

## 2015-01-15 ENCOUNTER — Other Ambulatory Visit: Payer: Self-pay

## 2015-01-15 MED ORDER — ISOSORBIDE MONONITRATE ER 30 MG PO TB24: 30.0000 mg | ORAL_TABLET | Freq: Every day | ORAL | Status: DC

## 2015-01-20 ENCOUNTER — Other Ambulatory Visit: Payer: Self-pay | Admitting: *Deleted

## 2015-01-20 ENCOUNTER — Ambulatory Visit: Payer: Commercial Managed Care - HMO | Admitting: *Deleted

## 2015-01-20 NOTE — Patient Outreach (Signed)
01/20/15- Telephone call received from patient stating she needs to cancel her scheduled home visit for today.  Visit rescheduled for 02/02/15.  Jacqlyn Larsen [facility], [place] Coordinator [phone]

## 2015-01-21 ENCOUNTER — Telehealth: Payer: Self-pay | Admitting: Family Medicine

## 2015-01-23 ENCOUNTER — Ambulatory Visit
Admission: RE | Admit: 2015-01-23 | Discharge: 2015-01-23 | Disposition: A | Discharge status: Home / Self Care | Payer: Commercial Managed Care - HMO | Source: Ambulatory Visit | Attending: Surgery | Admitting: Surgery

## 2015-01-29 ENCOUNTER — Ambulatory Visit (HOSPITAL_COMMUNITY)
Admission: RE | Admit: 2015-01-29 | Discharge: 2015-01-29 | Disposition: A | Discharge status: Home / Self Care | Payer: Commercial Managed Care - HMO | Source: Ambulatory Visit | Attending: General Surgery | Admitting: General Surgery

## 2015-01-29 ENCOUNTER — Encounter (HOSPITAL_COMMUNITY): Payer: Self-pay

## 2015-01-29 ENCOUNTER — Ambulatory Visit (HOSPITAL_COMMUNITY): Payer: Commercial Managed Care - HMO | Admitting: Certified Registered Nurse Anesthetist

## 2015-01-29 ENCOUNTER — Encounter (HOSPITAL_COMMUNITY)
Admission: RE | Disposition: A | Discharge status: Home / Self Care | Payer: Self-pay | Source: Ambulatory Visit | Attending: General Surgery

## 2015-01-29 DIAGNOSIS — Z87891 Personal history of nicotine dependence: Secondary | ICD-10-CM | POA: Insufficient documentation

## 2015-01-29 DIAGNOSIS — I252 Old myocardial infarction: Secondary | ICD-10-CM | POA: Diagnosis not present

## 2015-01-29 DIAGNOSIS — Z955 Presence of coronary angioplasty implant and graft: Secondary | ICD-10-CM | POA: Diagnosis not present

## 2015-01-29 DIAGNOSIS — M797 Fibromyalgia: Secondary | ICD-10-CM | POA: Insufficient documentation

## 2015-01-29 DIAGNOSIS — I10 Essential (primary) hypertension: Secondary | ICD-10-CM | POA: Insufficient documentation

## 2015-01-29 DIAGNOSIS — Z8673 Personal history of transient ischemic attack (TIA), and cerebral infarction without residual deficits: Secondary | ICD-10-CM | POA: Insufficient documentation

## 2015-01-29 DIAGNOSIS — Z7951 Long term (current) use of inhaled steroids: Secondary | ICD-10-CM | POA: Diagnosis not present

## 2015-01-29 DIAGNOSIS — E785 Hyperlipidemia, unspecified: Secondary | ICD-10-CM | POA: Insufficient documentation

## 2015-01-29 DIAGNOSIS — J449 Chronic obstructive pulmonary disease, unspecified: Secondary | ICD-10-CM | POA: Insufficient documentation

## 2015-01-29 DIAGNOSIS — E1151 Type 2 diabetes mellitus with diabetic peripheral angiopathy without gangrene: Secondary | ICD-10-CM | POA: Insufficient documentation

## 2015-01-29 DIAGNOSIS — Z1211 Encounter for screening for malignant neoplasm of colon: Secondary | ICD-10-CM | POA: Diagnosis present

## 2015-01-29 DIAGNOSIS — D122 Benign neoplasm of ascending colon: Secondary | ICD-10-CM | POA: Diagnosis not present

## 2015-01-29 DIAGNOSIS — Z79899 Other long term (current) drug therapy: Secondary | ICD-10-CM | POA: Insufficient documentation

## 2015-01-29 DIAGNOSIS — J45909 Unspecified asthma, uncomplicated: Secondary | ICD-10-CM | POA: Insufficient documentation

## 2015-01-29 DIAGNOSIS — Z7982 Long term (current) use of aspirin: Secondary | ICD-10-CM | POA: Insufficient documentation

## 2015-01-29 DIAGNOSIS — Z933 Colostomy status: Secondary | ICD-10-CM | POA: Diagnosis not present

## 2015-01-29 DIAGNOSIS — I2510 Atherosclerotic heart disease of native coronary artery without angina pectoris: Secondary | ICD-10-CM | POA: Insufficient documentation

## 2015-01-29 DIAGNOSIS — D123 Benign neoplasm of transverse colon: Secondary | ICD-10-CM | POA: Insufficient documentation

## 2015-01-29 DIAGNOSIS — K219 Gastro-esophageal reflux disease without esophagitis: Secondary | ICD-10-CM | POA: Diagnosis not present

## 2015-01-29 HISTORY — DX: Unspecified asthma, uncomplicated: J45.909

## 2015-01-29 HISTORY — PX: COLONOSCOPY: SHX5424

## 2015-01-29 SURGERY — COLONOSCOPY
Anesthesia: Monitor Anesthesia Care

## 2015-01-29 MED ORDER — MIDAZOLAM HCL 5 MG/ML IJ SOLN
INTRAMUSCULAR | Status: AC
  Filled 2015-01-29: qty 1

## 2015-01-29 MED ORDER — FENTANYL CITRATE (PF) 100 MCG/2ML IJ SOLN
INTRAMUSCULAR | Status: DC | PRN
  Administered 2015-01-29 (×4): 25 ug via INTRAVENOUS

## 2015-01-29 MED ORDER — SODIUM CHLORIDE 0.9 % IV SOLN: INTRAVENOUS | Status: DC

## 2015-01-29 MED ORDER — FENTANYL CITRATE (PF) 100 MCG/2ML IJ SOLN
INTRAMUSCULAR | Status: AC
  Filled 2015-01-29: qty 2

## 2015-01-29 MED ORDER — MIDAZOLAM HCL 10 MG/2ML IJ SOLN
INTRAMUSCULAR | Status: DC | PRN
  Administered 2015-01-29 (×2): 2 mg via INTRAVENOUS
  Administered 2015-01-29: 1 mg via INTRAVENOUS

## 2015-01-29 MED ORDER — PROPOFOL 10 MG/ML IV BOLUS
INTRAVENOUS | Status: AC
  Filled 2015-01-29: qty 20

## 2015-01-29 NOTE — Anesthesia Preprocedure Evaluation (Deleted)
Anesthesia Evaluation  Patient identified by MRN, date of birth, ID band Patient awake    Reviewed: Allergy & Precautions, NPO status , Patient's Chart, lab work & pertinent test results, reviewed documented beta blocker date and time   History of Anesthesia Complications (+) PONV and history of anesthetic complications  Airway Mallampati: II  TM Distance: <3 FB Neck ROM: Full    Dental  (+) Dental Advisory Given, Upper Dentures, Missing, Poor Dentition   Pulmonary neg pulmonary ROS, asthma , COPD,  COPD inhaler, former smoker,  S/P partial lobectomy 1997   breath sounds clear to auscultation       Cardiovascular hypertension, Pt. on medications and Pt. on home beta blockers + CAD, + Past MI and + Peripheral Vascular Disease  negative cardio ROS   Rhythm:Regular Rate:Normal  a. 07/2012 s/p MI ->not cath candidate 2/2 AKI; b. 09/2012 Neg MV;  c. 08/2014 Lexi CL: EF 45-54%, + ischemia, intermittent risk study.   Neuro/Psych Anxiety CVA, No Residual Symptoms    GI/Hepatic Neg liver ROS, GERD  Medicated,  Endo/Other  diabetes  Renal/GU Renal disease     Musculoskeletal negative musculoskeletal ROS (+) Fibromyalgia -  Abdominal   Peds  Hematology negative hematology ROS (+) anemia , H/H 10.7/33.6   Anesthesia Other Findings Day of surgery medications reviewed with the patient.  Reproductive/Obstetrics                          Anesthesia Physical Anesthesia Plan  ASA: III  Anesthesia Plan: MAC   Post-op Pain Management:    Induction: Intravenous  Airway Management Planned: Nasal Cannula  Additional Equipment:   Intra-op Plan:   Post-operative Plan:   Informed Consent: I have reviewed the patients History and Physical, chart, labs and discussed the procedure including the risks, benefits and alternatives for the proposed anesthesia with the patient or authorized representative who has  indicated his/her understanding and acceptance.   Dental advisory given  Plan Discussed with: CRNA and Anesthesiologist  Anesthesia Plan Comments: (Discussed risks/benefits/alternatives to MAC sedation including need for ventilatory support, hypotension, need for conversion to general anesthesia.  All patient questions answered.  Patient wished to proceed.)        Anesthesia Quick Evaluation

## 2015-01-29 NOTE — Discharge Instructions (Signed)
Colonoscopy, Care After °These instructions give you information on caring for yourself after your procedure. Your doctor may also give you more specific instructions. Call your doctor if you have any problems or questions after your procedure. °HOME CARE °· Do not drive for 24 hours. °· Do not sign important papers or use machinery for 24 hours. °· You may shower. °· You may go back to your usual activities, but go slower for the first 24 hours. °· Take rest breaks often during the first 24 hours. °· Walk around or use warm packs on your belly (abdomen) if you have belly cramping or gas. °· Drink enough fluids to keep your pee (urine) clear or pale yellow. °· Resume your normal diet. Avoid heavy or fried foods. °· Avoid drinking alcohol for 24 hours or as told by your doctor. °· Only take medicines as told by your doctor. °If a tissue sample (biopsy) was taken during the procedure:  °· Do not take aspirin or blood thinners for 7 days, or as told by your doctor. °· Do not drink alcohol for 7 days, or as told by your doctor. °· Eat soft foods for the first 24 hours. °GET HELP IF: °You still have a small amount of blood in your poop (stool) 2-3 days after the procedure. °GET HELP RIGHT AWAY IF: °· You have more than a small amount of blood in your poop. °· You see clumps of tissue (blood clots) in your poop. °· Your belly is puffy (swollen). °· You feel sick to your stomach (nauseous) or throw up (vomit). °· You have a fever. °· You have belly pain that gets worse and medicine does not help. °MAKE SURE YOU: °· Understand these instructions. °· Will watch your condition. °· Will get help right away if you are not doing well or get worse. °Document Released: 05/21/2010 Document Revised: 04/23/2013 Document Reviewed: 12/24/2012 °[employer]® Patient Information ©2015 [employer]. This information is not intended to replace advice given to you by your health care provider. Make sure you discuss any questions you have with  your health care provider. ° °

## 2015-01-29 NOTE — H&P (Signed)
KEYLANI PERLSTEIN is an 64 y.o. female.   Chief Complaint: colonoscopy HPI: Here for completion screening colonoscopy.    Past Medical History  Diagnosis Date  . CAD (coronary artery disease)     a. 07/2012 s/p MI ->not cath candidate 2/2 AKI; b. 09/2012 Neg MV;  c. 08/2014 [facility]: EF 45-54%, + ischemia, intermittent risk study.  Marland Kitchen PVD (peripheral vascular disease)   . CVA (cerebral infarction)     TIAx2  . HTN (hypertension)   . HLD (hyperlipidemia)   . Tobacco abuse   . Diverticulitis 08/04/12  . S/P partial lobectomy of lung 1997    per [facility], incidental finding, path c/w benign lesion  . Stroke   . COPD (chronic obstructive pulmonary disease)   . Fibromyalgia   . PONV (postoperative nausea and vomiting)   . Anxiety   . Diabetes mellitus without complication     on no meds   . GERD (gastroesophageal reflux disease)   . H/O echocardiogram     a. 07/2012 Echo: EF 50-55%, grade 2 DD, mild LVH.  Marland Kitchen Abnormal cardiac function test 09/22/2014  . Subclavian steal syndrome 2004    Dr. Kellie Simmering. Angioplasty according to patient  . Asthma     Past Surgical History  Procedure Laterality Date  . Hysterectomoy    . Lumbar spine surgery    . Carpal tunnel release    . Coronary angioplasty    . Coronary stents     . Partial lobectomy of lung   1998  . Abdominal hysterectomy    . Back surgery    . Nephrolithotomy Right 12/24/2012    Procedure: NEPHROLITHOTOMY PERCUTANEOUS;  Surgeon: Franchot Gallo, MD;  Location: [facility];  Service: Urology;  Laterality: Right;  . Flexible sigmoidoscopy  07/11/2014    Procedure: FLEXIBLE SIGMOIDOSCOPY;  Surgeon: Leighton Ruff, MD;  Location: [facility];  Service: Endoscopy;;  . Laparotomy N/A 10/02/2014    Procedure: EXPLORATORY LAPAROTOMY WITH PARTIAL COLECTOMY/COLOSTOMY;  Surgeon: Erroll Luna, MD;  Location: [place];  Service: General;  Laterality: N/A;  . Cardiac catheterization N/A 10/15/2014    Procedure: Left Heart Cath and Coronary Angiography;   Surgeon: Sherren Mocha, MD;  Location: [place] [facility];  Service: Cardiovascular;  Laterality: N/A;    Family History  Problem Relation Age of Onset  . Kidney disease Mother    Social History:  reports that she quit smoking about 4 months ago. Her smoking use included Cigarettes. She has a 12.25 pack-year smoking history. She has never used smokeless tobacco. She reports that she does not drink alcohol or use illicit drugs.  Allergies:  Allergies  Allergen Reactions  . Penicillins Rash  . Sulfa Antibiotics Rash    Medications Prior to Admission  Medication Sig Dispense Refill  . acetaminophen (TYLENOL) 500 MG tablet Take 1,000 mg by mouth every 6 (six) hours as needed for pain.    Marland Kitchen albuterol (PROVENTIL HFA;VENTOLIN HFA) 108 (90 BASE) MCG/ACT inhaler Inhale 2 puffs into the lungs every 6 (six) hours as needed for wheezing or shortness of breath. 1 Inhaler 6  . amLODipine (NORVASC) 10 MG tablet Take 1 tablet (10 mg total) by mouth daily. 90 tablet 1  . aspirin 325 MG tablet Take 325 mg by mouth daily.    . isosorbide mononitrate (IMDUR) 30 MG 24 hr tablet Take 1 tablet (30 mg total) by mouth daily. 30 tablet 4  . metoprolol tartrate (LOPRESSOR) 25 MG tablet Take 0.5 tablets (12.5 mg total) by  mouth 2 (two) times daily. 90 tablet 1  . omeprazole (PRILOSEC) 20 MG capsule TAKE 1 CAPSULE (20 MG TOTAL)  BY MOUTH DAILY. 30 capsule 4  . polyethylene glycol (MIRALAX / GLYCOLAX) packet Take 17 g by mouth daily as needed for mild constipation. 14 each 0  . pravastatin (PRAVACHOL) 80 MG tablet Take 1 tablet (80 mg total) by mouth at bedtime. 90 tablet 3  . tiotropium (SPIRIVA) 18 MCG inhalation capsule Place 1 capsule (18 mcg total) into inhaler and inhale daily. 30 capsule 12  . Vitamin D, Ergocalciferol, (DRISDOL) 50000 UNITS CAPS capsule Take 1 capsule (50,000 Units total) by mouth 2 (two) times a week. 16 capsule 0  . budesonide-formoterol (SYMBICORT) 160-4.5 MCG/ACT inhaler Inhale 2 puffs  into the lungs 2 (two) times daily. 1 Inhaler 12  . cholecalciferol (VITAMIN D) 1000 UNITS tablet Take 2 tablets (2,000 Units total) by mouth at bedtime. 60 tablet 5  . tiZANidine (ZANAFLEX) 2 MG tablet Take 1 tablet (2 mg total) by mouth every 6 (six) hours as needed. 30 tablet 0    No results found for this or any previous visit (from the past 48 hour(s)). No results found.  Review of Systems  Constitutional: Negative for fever and chills.  Eyes: Negative for blurred vision and double vision.  Respiratory: Negative for cough and shortness of breath.   Cardiovascular: Negative for chest pain and leg swelling.  Gastrointestinal: Negative for nausea, vomiting and abdominal pain.  Genitourinary: Negative for dysuria, urgency and frequency.  Neurological: Negative for dizziness and headaches.    Blood pressure 189/68, pulse 56, temperature 97.7 F (36.5 C), temperature source Oral, resp. rate 17, height 5\' 1"  (1.549 m), weight 58.968 kg (130 lb), SpO2 97 %. Physical Exam  Constitutional: She is oriented to person, place, and time. She appears well-developed and well-nourished.  HENT:  Head: Normocephalic and atraumatic.  Eyes: Conjunctivae are normal. Pupils are equal, round, and reactive to light.  Neck: Normal range of motion. Neck supple.  Cardiovascular: Normal rate and regular rhythm.   Respiratory: Effort normal and breath sounds normal. No respiratory distress.  GI: Soft. Bowel sounds are normal. She exhibits no distension. There is no tenderness. There is no rebound.  Neurological: She is alert and oriented to person, place, and time.     Assessment/Plan Pt with h/o obstructive colon mass.  S/P Hartman's procedure.  Now here for completion colonoscopy.  Risks of the procedure explained.  These include bleeding and perforation.  She has agreed to proceed.   THOMAS, ALICIA C. 06/01/8655, 84:69 AM

## 2015-01-29 NOTE — Op Note (Signed)
[facility] [place], [address]   COLONOSCOPY PROCEDURE REPORT  PATIENT: Alexa, Key  MR#: [id number] BIRTHDATE: [birth year] , 22  yrs. old GENDER: female ENDOSCOPIST: Rosario Adie, MD REFERRED BY: PROCEDURE DATE:  01/29/2015 PROCEDURE:   Colonoscopy, screening ASA CLASS:   Class III INDICATIONS:average risk patient for colorectal cancer. MEDICATIONS: Fentanyl 100 mcg IV and Versed 5 mg IV  DESCRIPTION OF PROCEDURE:   After the risks benefits and alternatives of the procedure were thoroughly explained, informed consent was obtained.  revealed no abnormalities of the rectum. The     endoscope was introduced through the descending colostomy and advanced to the cecum, which was identified by both the appendix and ileocecal valve. No adverse events experienced.   The quality of the prep was (MiraLax was used) excellent.  The instrument was then slowly withdrawn as the colon was fully examined. Estimated blood loss is zero unless otherwise noted in this procedure report.    Findings:   COLON FINDINGS: A polypoid shaped sessile polyp measuring 2 mm in size was found in the ascending colon.  A biopsy was performed using cold forceps.   A polypoid shaped sessile polyp measuring 5 mm in size was found in the transverse colon.  A polypectomy was performed with a cold snare.  The resection was complete, the polyp tissue was completely retrieved and sent to histology.   A polypoid shaped sessile polyp measuring 2 mm in size was found in the transverse colon.  Retroflexion was not performed.  Withdrawal time was   .  The scope was withdrawn and the procedure completed.  COMPLICATIONS: There were no immediate complications.  ENDOSCOPIC IMPRESSION:     1.   Sessile polyp was found in the ascending colon; biopsy was performed using cold forceps 2.   Sessile polyp was found in the transverse colon; polypectomy was performed with a cold snare 3.    Sessile polyp was found in the transverse colon  RECOMMENDATIONS:     Await biopsy results  eSigned:  Rosario Adie, MD 61/60/7371 12:50 PM   cc: Erroll Luna, MD  CPT CODES:     [phone] Colonoscopy, flexible, proximal to splenic flexure; diagnostic, with or without collection of specimen(s) by brushing or washing, with or without colon decompression (separate procedure) ICD CODES:  The ICD and CPT codes recommended by this software are interpretations from the data that the clinical staff has captured with the software.  The verification of the translation of this report to the ICD and CPT codes and modifiers is the sole responsibility of the health care institution and practicing physician where this report was generated.  [employer] will not be held responsible for the validity of the ICD and CPT codes included on this report.  AMA assumes no liability for data contained or not contained herein. CPT is a Designer, television/film set of the [employer].

## 2015-02-02 ENCOUNTER — Encounter: Payer: Self-pay | Admitting: *Deleted

## 2015-02-02 ENCOUNTER — Other Ambulatory Visit: Payer: Self-pay | Admitting: *Deleted

## 2015-02-02 NOTE — Patient Outreach (Signed)
[facility]) Care Management   02/02/2015  Alexa Key [birth year] [id number]  Alexa Key is an 64 y.o. female  Subjective: Routine home visit with pt, HIPAA verified, pt states she is weighing daily, pt states home health discharged her recently and pt is keeping in touch with home health RN because her supplies have not been shipped yet, supplies are supposed to be delivered tomorrow.  Pt states she has all medications, pt unsure if she wants to get the flu vaccine but "will think about it".  Pt reports " I just had colonoscopy last week and I'm supposed to get this colostomy reversed in October"  Pt states " I hope you can see me at least one more time next month since I'm having surgery".    Objective:   Filed Vitals:   02/02/15 1243  BP: 140/58  Pulse: 75  Resp: 18  weight 129 pounds  ROS  Physical Exam  Constitutional: She is oriented to person, place, and time. She appears well-developed and well-nourished.  HENT:  Head: Normocephalic.  Neck: Normal range of motion. Neck supple.  Cardiovascular: Normal rate and regular rhythm.   Respiratory: Effort normal and breath sounds normal.  GI: Soft. Bowel sounds are normal.  Pt has colostomy  Musculoskeletal: Normal range of motion.  Neurological: She is alert and oriented to person, place, and time.  Skin: Skin is warm and dry.  Psychiatric: She has a normal mood and affect. Her behavior is normal. Judgment and thought content normal.    Current Medications:   Current Outpatient Prescriptions  Medication Sig Dispense Refill  . acetaminophen (TYLENOL) 500 MG tablet Take 1,000 mg by mouth every 6 (six) hours as needed for pain.    Marland Kitchen albuterol (PROVENTIL HFA;VENTOLIN HFA) 108 (90 BASE) MCG/ACT inhaler Inhale 2 puffs into the lungs every 6 (six) hours as needed for wheezing or shortness of breath. 1 Inhaler 6  . amLODipine (NORVASC) 10 MG tablet Take 1 tablet (10 mg total) by mouth daily. 90 tablet 1  .  aspirin 325 MG tablet Take 325 mg by mouth daily.    . budesonide-formoterol (SYMBICORT) 160-4.5 MCG/ACT inhaler Inhale 2 puffs into the lungs 2 (two) times daily. 1 Inhaler 12  . cholecalciferol (VITAMIN D) 1000 UNITS tablet Take 2 tablets (2,000 Units total) by mouth at bedtime. 60 tablet 5  . isosorbide mononitrate (IMDUR) 30 MG 24 hr tablet Take 1 tablet (30 mg total) by mouth daily. 30 tablet 4  . metoprolol tartrate (LOPRESSOR) 25 MG tablet Take 0.5 tablets (12.5 mg total) by mouth 2 (two) times daily. 90 tablet 1  . omeprazole (PRILOSEC) 20 MG capsule TAKE 1 CAPSULE (20 MG TOTAL)  BY MOUTH DAILY. 30 capsule 4  . polyethylene glycol (MIRALAX / GLYCOLAX) packet Take 17 g by mouth daily as needed for mild constipation. 14 each 0  . pravastatin (PRAVACHOL) 80 MG tablet Take 1 tablet (80 mg total) by mouth at bedtime. 90 tablet 3  . tiotropium (SPIRIVA) 18 MCG inhalation capsule Place 1 capsule (18 mcg total) into inhaler and inhale daily. 30 capsule 12  . tiZANidine (ZANAFLEX) 2 MG tablet Take 1 tablet (2 mg total) by mouth every 6 (six) hours as needed. 30 tablet 0  . Vitamin D, Ergocalciferol, (DRISDOL) 50000 UNITS CAPS capsule Take 1 capsule (50,000 Units total) by mouth 2 (two) times a week. 16 capsule 0   No current facility-administered medications for this visit.    Functional Status:  In your present state of health, do you have any difficulty performing the following activities: 11/25/2014 09/30/2014  Hearing? N -  Vision? N -  Difficulty concentrating or making decisions? N -  Walking or climbing stairs? N Y  Dressing or bathing? N -  Doing errands, shopping? N -  Preparing Food and eating ? N -  Using the Toilet? N -  In the past six months, have you accidently leaked urine? N -  Do you have problems with loss of bowel control? (No Data) -  Managing your Medications? Y -  Managing your Finances? N -  Housekeeping or managing your Housekeeping? N -    Fall/Depression  Screening:    PHQ 2/9 Scores 02/02/2015 12/22/2014 11/25/2014 11/17/2014 05/08/2014  PHQ - 2 Score 0 0 0 0 0    Assessment:  RN CM faxed quarterly update to primary MD Dr. Livia Snellen, RN CM continues to focus on COPD, CHF teaching/ action plans, pt is in touch with home health RN about colostomy supplies, RN CM ask pt to call if she has any problems with process of obtaining supplies.  RN CM will see pt until she has surgery tentatively in October.   THN CM Care Plan Problem One        Most Recent Value   Care Plan Problem One  knowledge deficit related to CHF   Role Documenting the Problem One  Care Management [place] for Problem One  Active   THN Long Term Goal (31-90 days)  pt will have no readmissions related to CHF within 90 days.   THN Long Term Goal Start Date  11/12/14   Interventions for Problem One Long Term Goal   RN CM reinforced with pt- call for 3 pound weight gain overnight or 5 pounds in one week.    THN CM Short Term Goal #2 (0-30 days)  pt will consistently weigh daily and record within 30 days. [pt is weighing but not recording]   THN CM Short Term Goal #2 Start Date  12/23/14 [goal restarted, weighing/ not recording]   THN CM Short Term Goal #2 Met Date  02/02/15    [facility] CM Care Plan Problem Two        Most Recent Value   Care Plan Problem Two  Knowledge deficit related to COPD   Role Documenting the Problem Two  Care Management [place] for Problem Two  Active   Interventions for Problem Two Long Term Goal   RN CM reviewed COPD Action Plan and reviewed with pt.   RN CM reviewed importance of flu vaccine (pt is undecided on this) and avoiding sick persons, practicing handwashing to avoid spread of germs.   THN Long Term Goal (31-90) days  pt will have no readmissions related to COPD within 90 days   THN Long Term Goal Start Date  11/25/14   THN CM Short Term Goal #1 (0-30 days)  pt will verbalize signs/ symptoms of COPD exacerbation and actions to  take within 30 days   THN CM Short Term Goal #1 Start Date  11/25/14   [facility] CM Short Term Goal #1 Met Date   12/23/14    [facility] CM Care Plan Problem Three        Most Recent Value   Care Plan Problem Three  knowledge deficit related to resources for colostomy supplies   Role Documenting the Problem Three  Care Management Coordinator   Care Plan for  Problem Three  Active   THN CM Short Term Goal #1 (0-30 days)  pt will have resource in place for ordering colostomy supplies and be able to order them on her own.   THN CM Short Term Goal #1 Start Date  02/02/15 [goal restarted]   Interventions for Short Term Goal #1  RN CM talked with pt about continuing to collaborate with home health RN if there are any issues about colostomy supplies(pt still not sure where supplies are coming from)     Plan: follow up with home visit 03/03/15 Plan to discharge  Alexa Key [facility] [place] Coordinator [phone]

## 2015-02-11 ENCOUNTER — Encounter: Payer: Self-pay | Admitting: General Surgery

## 2015-02-15 ENCOUNTER — Other Ambulatory Visit: Payer: Self-pay | Admitting: Family Medicine

## 2015-02-23 ENCOUNTER — Ambulatory Visit: Payer: Self-pay | Admitting: Surgery

## 2015-02-23 NOTE — H&P (Signed)
Alexa S. [facility] 02/23/2015 3:47 PM Location: [facility] Patient #: [id number] DOB: January 25, 1951 Divorced / Language: [name] / Race: White Female  History of Present Illness Marcello Moores A. Gicela Schwarting MD; 02/23/2015 4:29 PM) Patient words: discuss colstomy reversal; BE and colonoscopy completed   Patient is here for follow-up to discuss colostomy closure. 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 She had a barium enema which showed a rectal stump and colon to be otherwise normal without complicating feature. She is ready for colostomy closure.       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 Preoperative evaluation prior to colostomy reversal.  EXAM: WATER SOLUBLE CONTRAST ENEMA  TECHNIQUE: Initially, a 54 French Foley catheter was placed into the rectum and a water-soluble contrast evaluation of the Hartmann pouch was performed. After draining the rectum, colostomy enema was performed. Spot images and overhead radiographs were obtained.  FLUOROSCOPY TIME: Radiation Exposure Index (as provided by the fluoroscopic device): 162 dGy per cm squared  If the device does not provide the exposure index:  Fluoroscopy Time: 3 min 12 sec  Number of Acquired Images: 2  COMPARISON: No prior fluoroscopic imaging. CT abdomen and pelvis 10/16/2014 dating back to 07/04/2014.  FINDINGS: Preliminary scout AP supine abdominal image demonstrates a normal bowel gas pattern. Aorto-iliofemoral and visceral artery atherosclerosis.  The Hartmann pouch has a normal appearance, and ends at the level of the upper rectum.  The colostomy enema demonstrates a normal appearance of the descending colon, transverse colon, ascending colon and rectum. The appendix fills normally. The terminal ileum filled via an incompetent ileocecal valve and also appears normal.  IMPRESSION: 1. Normal-appearing  Hartmann pouch, ending at the level of the upper rectum. 2. Normal colostomy enema. 3. 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   Electronically Signed By: [name] On: 01/23/2015 11:31.  The patient is a 64 year old female.   Problem List/Past Medical Ventura Sellers, [place]; 02/23/2015 3:50 PM) VAGINAL DISCHARGE (N89.8) POST-OPERATIVE STATE [phone])  Other Problems Ventura Sellers, [place]; 02/23/2015 3:50 PM) Oophorectomy Bilateral. Myocardial infarction Kidney Stone General anesthesia - complications Gastroesophageal Reflux Disease Hypercholesterolemia High blood pressure Diabetes Mellitus Back Pain Chronic Obstructive Lung Disease Cerebrovascular Accident SCREENING FOR COLORECTAL CANCER (Z12.11, Z12.12)  Past Surgical History Ventura Sellers, [place]; 02/23/2015 3:50 PM) Spinal Surgery - Lower Back Lung Surgery Right. Hysterectomy (not due to cancer) - Complete Carotid Artery Surgery Left.  Diagnostic Studies History Ventura Sellers, [place]; 02/23/2015 3:50 PM) Pap Smear 1-5 years ago Mammogram >3 years ago  Allergies Ventura Sellers, CMA; 02/23/2015 3:50 PM) Sulfa 10 *OPHTHALMIC AGENTS* Penicillin V Potassium *PENICILLINS* Penicillin V *PENICILLINS*  Medication History Ventura Sellers, CMA; 02/23/2015 3:50 PM) Fenofibrate (160MG  Tablet, Oral) Active. Losartan Potassium (100MG  Tablet, Oral) Active. Nitrofurantoin Monohyd Macro (100MG  Capsule, Oral) Active. Omeprazole (20MG  Capsule DR, Oral) Active. Vitamin C ER (500MG  Capsule ER, Oral daily) Active. AmLODIPine Besylate (10MG  Tablet, Oral daily) Active. Isosorbide Mononitrate ER (30MG  Tablet ER 24HR, Oral daily) Active. Spiriva HandiHaler (18MCG Capsule, Inhalation daily) Active. Ventolin HFA (108 (90 Base)MCG/ACT Aerosol Soln, Inhalation daily) Active. Zinc Gluconate (50MG  Tablet, Oral daily)  Active. Aspirin (325MG  Tablet, Oral daily) Active. Budesonide (Inhalation) (200MCG/INH Aerosol Powder, Inhalation daily) Active. Vitamin D High Potency (1000UNIT Capsule, Oral daily) Active. Lopressor (50MG  Tablet, 1/2 Oral daily) Active. Pravastatin Sodium (80MG  Tablet, Oral daily) Active. Florastor Kids (250MG  Packet, Oral daily) Active.  Multiple Vitamins (Oral daily) Active. Zanaflex (4MG  Capsule, Oral as needed) Active. Tylenol Extra Strength (500MG  Tablet, Oral as needed) Active. MiraLax (Oral daily) Active. Ambien (5MG  Tablet, Oral daily) Active. Medications Reconciled  Social History Ventura Sellers, [place]; 02/23/2015 3:50 PM) Tobacco use Current every day smoker. No drug use Caffeine use Carbonated beverages, Coffee, Tea. Alcohol use Remotely quit alcohol use.  Family History Ventura Sellers, [place]; 02/23/2015 3:50 PM) Alcohol Abuse Father. Kidney Disease Mother. Hypertension Daughter. Respiratory Condition Sister.  Pregnancy / Birth History Ventura Sellers, [place]; 02/23/2015 3:50 PM) Age at menarche 22 years. Gravida 2 Age of menopause <43 Para 2 Irregular periods    Vitals Sharyn Lull R. Brooks CMA; 02/23/2015 3:47 PM) 02/23/2015 3:47 PM Weight: 134.38 lb Height: 61in Body Surface Area: 1.6 m Body Mass Index: 25.39 kg/m  BP: 136/80 (Sitting, Left Arm, Standard)      Physical Exam (Tomisha Reppucci A. Niaomi Cartaya MD; 02/23/2015 4:29 PM)  General Mental Status-Alert. General Appearance-Consistent with stated age. Hydration-Well hydrated. Voice-Normal.  Chest and Lung Exam Chest and lung exam reveals -quiet, even and easy respiratory effort with no use of accessory muscles and on auscultation, normal breath sounds, no adventitious sounds and normal vocal resonance. Inspection Chest Wall - Normal. Back - normal.  Cardiovascular Cardiovascular examination reveals -normal heart sounds, regular rate and rhythm with no  murmurs and normal pedal pulses bilaterally.  Abdomen Note: Left lower quadrant ostomy functioning well. Midline incision well-healed. No rebound or guarding.    Assessment & Plan (Brooks Stotz A. 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 Risk of colostomy include bleeding, infection, leak of anastamosis, death, colostomy, organ injury, kidney injury, ureter injury, bladder injury, SBO, and need for other surgery. Patient high risk for bowel resection and/or repair from previous surgery. Pt agrees to proceed.  Current Plans The anatomy & physiology of the digestive tract was discussed. The pathophysiology was discussed. Possibility of remaining with an ostomy permanently was discussed. I offered ostomy takedown. Laparoscopic & open techniques were discussed.  Risks such as bleeding, infection, abscess, leak, reoperation, possible re-ostomy, injury to other organs, hernia, heart attack, death, and other risks were discussed. I noted a good likelihood this will help address the problem. Goals of post-operative recovery were discussed as well. We will work to minimize complications. Questions were answered. The patient expresses understanding & wishes to proceed with surgery.  Pt Education - [facility] Abdominal Surgery ([place]): discussed with patient and provided information.

## 2015-02-26 ENCOUNTER — Telehealth: Payer: Self-pay | Admitting: *Deleted

## 2015-02-26 NOTE — Telephone Encounter (Signed)
I have not seen this patient since her in-patient consultation.  She was seen by Mr. Samara Snide on 12/01/14 (& is listed as Dr. Jillyn Ledger patient - although he has not seen her in clinic since before 2014). Since I saw her, she had her initial surgery, then had a mild Demand Ischemia event related to anemia post-op. She has had a diagnostic cardiac cath following that event - non-obstructive CAD (but probably enough small vessel disease to explain demand ischemic + Troponin.  She has had TIA x 2, Chronic Diastolic HF (not active), CAD - non-obstructive by cath, DM-II controlled & not on Insulin, Cr M 2.   Her planned surgery is intraperitoneal - therefore is High Risk.  PREOPERATIVE CARDIAC RISK ASSESSMENT   Revised Cardiac Risk Index:  High Risk Surgery: yes; but less risky than a surgery already performed in same risk conditions  Defined as Intraperitoneal, intrathoracic or suprainguinal vascular  Active CAD: no; minimal Troponin elevation in setting of significant anemia.  No active Sx & Cath confirmed false Positive Nuclear ST.  CHF: no; has diastolic dysfunction, but no CHF Sx  Cerebrovascular Disease: yes; TIA  Diabetes: yes; On Insulin: no  CKD (Cr >~ 2): no;  Total: 2 Estimated Risk of Adverse Outcome: INTERMEDIATE  Estimated Risk of MI, PE, VF/VT (Cardiac Arrest), Complete Heart Block: ?~6.6 %   ACC/AHA Guidelines for "Clearance":  Step 1 - Need for Emergency Surgery: No:   If Yes - go straight to OR with perioperative surveillance  Step 2 - Active Cardiac Conditions (Unstable Angina, Decompensated HF, Significant  Arrhytmias - Complete HB, Mobitz II, Symptomatic VT or SVT, Severe Aortic Stenosis - mean gradient > 40 mmHg, Valve area < 1.0 cm2):   No:   If Yes - Evaluate & Treat per ACC/AHA Guidelines  Step 3 -  Low Risk Surgery: No: Intraperitoneal  If Yes --> proceed to OR  If No --> Step 4  Step 4 - Functional Capacity >= 4 METS without symptoms: Yes  If Yes  --> proceed to OR  If No --> Step 5  Step 5 --  Clinical Risk Factors (CRF)   1-2 or more CRFs: Yes  If Yes -- assess Surgical Risk, --   (High Risk Non-cardiac), Intraabdominal or thoracic vascular surgery --> Proceed to OR, or consider testing if it will change management.  --> Testing has already been done; No Further Testing required.   Recommendations (based upon findings from Mr Hager's clinic note.  Proceed to OR. Continue current Meds Avoid excessive BP & volume shifts, monitor for anemia given prior Demand Ischemia event noted during last hospital stay.   Alexa Key

## 2015-02-26 NOTE — Telephone Encounter (Signed)
Request for surgical clearance:  1. What type of surgery is being performed? COLOSTOMY REVERSAL UNDER ANESTHESIA   2. When is this surgery scheduled? TO BE SCHEDULE  3. Are there any medications that need to be held prior to surgery and how long? ASPIRIN   4. Name of physician performing surgery? DR Marcello Moores CORNETT  5. What is your office phone and fax number?  [phone] ; PHONE [phone] 6.

## 2015-02-27 NOTE — Telephone Encounter (Signed)
FORWARD TO [facility] ATTN [place]

## 2015-03-03 ENCOUNTER — Telehealth: Payer: Self-pay | Admitting: Family Medicine

## 2015-03-03 ENCOUNTER — Other Ambulatory Visit: Payer: Self-pay | Admitting: *Deleted

## 2015-03-03 ENCOUNTER — Encounter: Payer: Self-pay | Admitting: *Deleted

## 2015-03-03 NOTE — Patient Outreach (Signed)
[facility]) Care Management   03/03/2015  Alexa Key 1950-12-28 [id number]  Alexa Key is an 64 y.o. female  Subjective: Routine home visit with pt, HIPAA verified, pt states "I'm doing really well"  Pt reports she has been following up with MD and will have colostomy reversed "sometime soon but not sure of date"  No new problems or issues reported.  Objective:  Filed Vitals:   03/03/15 1216  BP: 126/66  Pulse: 73  Resp: 16  Weight: 131 lb (59.421 kg)  SpO2: 95%    ROS  Physical Exam  Constitutional: She is oriented to person, place, and time. She appears well-developed and well-nourished.  HENT:  Head: Normocephalic.  Neck: Normal range of motion. Neck supple.  Cardiovascular: Normal rate and regular rhythm.   Respiratory: Effort normal and breath sounds normal.  GI: Soft. Bowel sounds are normal.  Musculoskeletal: Normal range of motion. She exhibits no edema.  Neurological: She is alert and oriented to person, place, and time.  Skin: Skin is warm and dry.  Colostomy to left lower quadrant  Psychiatric: She has a normal mood and affect. Her behavior is normal. Judgment and thought content normal.    Current Medications:   Current Outpatient Prescriptions  Medication Sig Dispense Refill  . acetaminophen (TYLENOL) 500 MG tablet Take 1,000 mg by mouth every 6 (six) hours as needed for pain.    Marland Kitchen albuterol (PROVENTIL HFA;VENTOLIN HFA) 108 (90 BASE) MCG/ACT inhaler Inhale 2 puffs into the lungs every 6 (six) hours as needed for wheezing or shortness of breath. 1 Inhaler 6  . amLODipine (NORVASC) 10 MG tablet Take 1 tablet (10 mg total) by mouth daily. 90 tablet 1  . aspirin 325 MG tablet Take 325 mg by mouth daily.    . budesonide-formoterol (SYMBICORT) 160-4.5 MCG/ACT inhaler Inhale 2 puffs into the lungs 2 (two) times daily. 1 Inhaler 12  . cholecalciferol (VITAMIN D) 1000 UNITS tablet Take 2 tablets (2,000 Units total) by mouth at bedtime. 60  tablet 5  . isosorbide mononitrate (IMDUR) 30 MG 24 hr tablet Take 1 tablet (30 mg total) by mouth daily. 30 tablet 4  . metoprolol tartrate (LOPRESSOR) 25 MG tablet Take 0.5 tablets (12.5 mg total) by mouth 2 (two) times daily. 90 tablet 1  . omeprazole (PRILOSEC) 20 MG capsule TAKE 1 CAPSULE (20 MG TOTAL)  BY MOUTH DAILY. 30 capsule 4  . polyethylene glycol (MIRALAX / GLYCOLAX) packet Take 17 g by mouth daily as needed for mild constipation. 14 each 0  . pravastatin (PRAVACHOL) 80 MG tablet Take 1 tablet (80 mg total) by mouth at bedtime. 90 tablet 3  . tiotropium (SPIRIVA) 18 MCG inhalation capsule Place 1 capsule (18 mcg total) into inhaler and inhale daily. 30 capsule 12  . tiZANidine (ZANAFLEX) 2 MG tablet Take 1 tablet (2 mg total) by mouth every 6 (six) hours as needed. 30 tablet 0  . Vitamin D, Ergocalciferol, (DRISDOL) 50000 UNITS CAPS capsule Take 1 capsule (50,000 Units total) by mouth 2 (two) times a week. 16 capsule 0   No current facility-administered medications for this visit.    Functional Status:   In your present state of health, do you have any difficulty performing the following activities: 11/25/2014 09/30/2014  Hearing? N -  Vision? N -  Difficulty concentrating or making decisions? N -  Walking or climbing stairs? N Y  Dressing or bathing? N -  Doing errands, shopping? N -  Conservation officer, nature  and eating ? N -  Using the Toilet? N -  In the past six months, have you accidently leaked urine? N -  Do you have problems with loss of bowel control? (No Data) -  Managing your Medications? Y -  Managing your Finances? N -  Housekeeping or managing your Housekeeping? N -    Fall/Depression Screening:    PHQ 2/9 Scores 02/02/2015 12/22/2014 11/25/2014 11/17/2014 05/08/2014  PHQ - 2 Score 0 0 0 0 0    Assessment:  RN CM reviewed discharge plan with pt and will close case today as goals are met.  RN CM faxed case closure letter to primary MD- Dr. Livia Snellen.  Pt to have colostomy  reversal in the near future but not sure of the date.  Pt continues weighing daily, pt to receive flu vaccine after her surgery citing she does not want to take a chance and "get sick before my surgery".  [facility] CM Care Plan Problem One        Most Recent Value   Care Plan Problem One  knowledge deficit related to CHF   Role Documenting the Problem One  Care Management [place] for Problem One  Active   [facility]N Long Term Goal (31-90 days)  pt will have no readmissions related to CHF within 90 days.   [facility]N Long Term Goal Start Date  11/12/14   [facility] Long Term Goal Met Date  03/03/15   Interventions for Problem One Long Term Goal   RN CM reminded pt- call for 3 pound weight gain overnight or 5 pounds in one week.    [facility] CM Short Term Goal #2 (0-30 days)  -- [pt is weighing but not recording]   [facility]N CM Short Term Goal #2 Start Date  -- [goal restarted, weighing/ not recording]    [facility] CM Care Plan Problem Two        Most Recent Value   Care Plan Problem Two  Knowledge deficit related to COPD   Role Documenting the Problem Two  Care Management [place] for Problem Two  Active   Interventions for Problem Two Long Term Goal   RN CM reinforced COPD Action Plan and reviewed with pt.   RN CM reviewed importance of flu vaccine (pt is undecided on this) and avoiding sick persons, practicing handwashing to avoid spread of germs.   [facility]N Long Term Goal (31-90) days  pt will have no readmissions related to COPD within 90 days   [facility]N Long Term Goal Start Date  11/25/14   [facility] Long Term Goal Met Date  03/03/15 [goal met]    [facility] CM Care Plan Problem Three        Most Recent Value   Care Plan Problem Three  knowledge deficit related to resources for colostomy supplies   Role Documenting the Problem Three  Care Management Coordinator   Care Plan for Problem Three  Active   [facility]N CM Short Term Goal #1 (0-30 days)  pt will have resource in place for ordering colostomy supplies and be able to  order them on her own.   [facility]N CM Short Term Goal #1 Start Date  02/02/15 [goal restarted]   [facility] CM Short Term Goal #1 Met Date  03/03/15   Interventions for Short Term Goal #1  Pt able to order her supplies, RN CM observed colostomy per pt request to make sure pt is applying ostomy appliance correctly, reinforced importance of preserving skin  integrity and changing appliance with any leakage, use mirror.      Plan: Discharge today  Jacqlyn Larsen [facility], BSN [place] Coordinator [phone]

## 2015-03-10 NOTE — Patient Outreach (Signed)
[facility]) Care Management  03/10/2015  Alexa Key [birth year] [id number]   Notification from Jacqlyn Larsen, RN to close case due to goals met with [place] Management.  Thanks, Ronnell Freshwater. Ann[place], [place] Assistant Phone: [phone] Fax: [phone]

## 2015-04-16 ENCOUNTER — Telehealth: Payer: Self-pay | Admitting: Family Medicine

## 2015-04-22 ENCOUNTER — Other Ambulatory Visit: Payer: Self-pay | Admitting: Nurse Practitioner

## 2015-04-30 NOTE — Telephone Encounter (Signed)
[place], they said insurance hasnt gotten the supplies prior authorized yet.

## 2015-05-11 ENCOUNTER — Other Ambulatory Visit: Payer: Self-pay | Admitting: Family Medicine

## 2015-05-12 ENCOUNTER — Other Ambulatory Visit: Payer: Self-pay

## 2015-05-12 MED ORDER — ISOSORBIDE MONONITRATE ER 30 MG PO TB24: 30.0000 mg | ORAL_TABLET | Freq: Every day | ORAL | Status: DC

## 2015-05-12 MED ORDER — OMEPRAZOLE 20 MG PO CPDR: DELAYED_RELEASE_CAPSULE | ORAL | Status: DC

## 2015-05-18 ENCOUNTER — Telehealth: Payer: Self-pay | Admitting: Family Medicine

## 2015-05-25 ENCOUNTER — Encounter (HOSPITAL_COMMUNITY): Payer: Self-pay

## 2015-05-25 ENCOUNTER — Encounter (HOSPITAL_COMMUNITY)
Admission: RE | Admit: 2015-05-25 | Discharge: 2015-05-25 | Disposition: A | Discharge status: Home / Self Care | Payer: Commercial Managed Care - HMO | Source: Ambulatory Visit | Attending: Surgery | Admitting: Surgery

## 2015-05-25 DIAGNOSIS — K219 Gastro-esophageal reflux disease without esophagitis: Secondary | ICD-10-CM | POA: Insufficient documentation

## 2015-05-25 DIAGNOSIS — J45909 Unspecified asthma, uncomplicated: Secondary | ICD-10-CM | POA: Insufficient documentation

## 2015-05-25 DIAGNOSIS — E785 Hyperlipidemia, unspecified: Secondary | ICD-10-CM | POA: Insufficient documentation

## 2015-05-25 DIAGNOSIS — I252 Old myocardial infarction: Secondary | ICD-10-CM | POA: Insufficient documentation

## 2015-05-25 DIAGNOSIS — M797 Fibromyalgia: Secondary | ICD-10-CM | POA: Insufficient documentation

## 2015-05-25 DIAGNOSIS — Z01818 Encounter for other preprocedural examination: Secondary | ICD-10-CM | POA: Insufficient documentation

## 2015-05-25 DIAGNOSIS — E1122 Type 2 diabetes mellitus with diabetic chronic kidney disease: Secondary | ICD-10-CM | POA: Insufficient documentation

## 2015-05-25 DIAGNOSIS — I5030 Unspecified diastolic (congestive) heart failure: Secondary | ICD-10-CM | POA: Insufficient documentation

## 2015-05-25 DIAGNOSIS — Z902 Acquired absence of lung [part of]: Secondary | ICD-10-CM | POA: Diagnosis not present

## 2015-05-25 DIAGNOSIS — Z87891 Personal history of nicotine dependence: Secondary | ICD-10-CM | POA: Diagnosis not present

## 2015-05-25 DIAGNOSIS — Z79899 Other long term (current) drug therapy: Secondary | ICD-10-CM | POA: Diagnosis not present

## 2015-05-25 DIAGNOSIS — I130 Hypertensive heart and chronic kidney disease with heart failure and stage 1 through stage 4 chronic kidney disease, or unspecified chronic kidney disease: Secondary | ICD-10-CM | POA: Diagnosis not present

## 2015-05-25 DIAGNOSIS — J449 Chronic obstructive pulmonary disease, unspecified: Secondary | ICD-10-CM | POA: Diagnosis not present

## 2015-05-25 DIAGNOSIS — Z7982 Long term (current) use of aspirin: Secondary | ICD-10-CM | POA: Diagnosis not present

## 2015-05-25 DIAGNOSIS — Z933 Colostomy status: Secondary | ICD-10-CM | POA: Insufficient documentation

## 2015-05-25 DIAGNOSIS — N189 Chronic kidney disease, unspecified: Secondary | ICD-10-CM | POA: Diagnosis not present

## 2015-05-25 DIAGNOSIS — Z01812 Encounter for preprocedural laboratory examination: Secondary | ICD-10-CM | POA: Diagnosis not present

## 2015-05-25 DIAGNOSIS — F419 Anxiety disorder, unspecified: Secondary | ICD-10-CM | POA: Insufficient documentation

## 2015-05-25 DIAGNOSIS — I2510 Atherosclerotic heart disease of native coronary artery without angina pectoris: Secondary | ICD-10-CM | POA: Diagnosis not present

## 2015-05-25 LAB — CBC
HCT: 42.7 % (ref 36.0–46.0)
Hemoglobin: 13.8 g/dL (ref 12.0–15.0)
MCH: 31.9 pg (ref 26.0–34.0)
MCHC: 32.3 g/dL (ref 30.0–36.0)
MCV: 98.6 fL (ref 78.0–100.0)
PLATELETS: 231 10*3/uL (ref 150–400)
RBC: 4.33 MIL/uL (ref 3.87–5.11)
RDW: 13.9 % (ref 11.5–15.5)
WBC: 11.6 10*3/uL — ABNORMAL HIGH (ref 4.0–10.5)

## 2015-05-25 LAB — BASIC METABOLIC PANEL
ANION GAP: 10 (ref 5–15)
BUN: 14 mg/dL (ref 6–20)
CALCIUM: 9.3 mg/dL (ref 8.9–10.3)
CO2: 24 mmol/L (ref 22–32)
CREATININE: 1.23 mg/dL — AB (ref 0.44–1.00)
Chloride: 109 mmol/L (ref 101–111)
GFR calc Af Amer: 52 mL/min — ABNORMAL LOW (ref >60)
GFR, EST NON AFRICAN AMERICAN: 45 mL/min — AB (ref >60)
GLUCOSE: 103 mg/dL — AB (ref 65–99)
Potassium: 4.1 mmol/L (ref 3.5–5.1)
Sodium: 143 mmol/L (ref 135–145)

## 2015-05-25 NOTE — Progress Notes (Signed)
Pt stated that Dr Percival Spanish gave cardiac clearance for closure

## 2015-05-26 NOTE — Progress Notes (Signed)
Anesthesia Chart Review: Patient is a 65 year old female scheduled for colostomy closure on 06/02/15 by Dr. Brantley Stage. According to prior notes, she had known colovaginal fistula needing repair. As part of a pre-operative work-up she had an abnormal (intermediate risk) stress test in the setting of known CAD with MI '14. A cardiac cath was planned, but she was admitted with acutely with bowel obstruction on 09/29/14. Cardiac cath was planned but ultimately deferred due to acute on chronic KD and because findings would unlikely change therapy. She subsequently underwent sigmoid colectomy with colostomy and takedown of colovaginal fistula on 10/02/14. Post-operatively she developed chest with a mildly elevated troponin (peak 0.16) in the setting of anemia. She was transfused and cardiology consulted. Cath was ultimately completed on 10/15/14 and did not show high risk anatomy so medical therapy was recommended. Renal function returned to baseline. She was discharged to a SNF on 10/21/14.  Other history includes former smoker (quit 09/29/14), post-operative N/V, PVD with left subclavian steal syndrome s/p angioplasty (Dr. Kellie Simmering), CAD/MI '14 and 10/2014 (demand ischemia), chronic diastolic CHF, HTN, HLD, partial lung lobectomy '97 (benign pathology), COPD, asthma, fibromyalgia, anxiety, DM2 (diet controlled), CKD, GERD, hysterectomy, back surgery. PCP is listed as Dr. [name].   No vitals were documented in Epic from her PAT visit.  Meds include albuterol, amlodipine, ASA 325 mg, Symbicort, Imdur, Lopressor, Prilosec, pravastatin, Spiriva, Zanaflex.  Primary cardiologist is listed Dr. Percival Spanish, but she had not seen him since 2014. Most recently she had been seen by Dr. Ellyn Hack during her 10/2014 hospitalization with hospital follow-up with Tarri Fuller, PA-C on 12/01/14. Based on findings at that visit, Dr. Ellyn Hack cleared patient to proceed to OR if no new changes with recommendation to continue current medications,  avoid excessive BP and volume shifts, and monitor for anemia given prior demand ischemia event during her 10/2014 hospital stay.   10/15/14 LHC:  Dominance: Right   Left Anterior Descending   . Prox LAD to Mid LAD lesion, 40% stenosed.   . Mid LAD lesion, 40% stenosed. diffuse .   Marland Kitchen First Diagonal Branch   The vessel is small in size.   . 1st Diag lesion, 90% stenosed.     Left Circumflex   . Mid Cx lesion, 60% stenosed. calcified diffuse .   Marland Kitchen Second Obtuse Marginal Branch   There is mild.   . Third Obtuse Marginal Branch   There is mild.     Right Coronary Artery   . Mid RCA lesion, 60% stenosed. calcified diffuse .   Marland Kitchen Dist RCA lesion, 70% stenosed. diffuse .     FINAL CONCLUSIONS:  DIFFUSE CALCIFIED CORONARY ARTERY DISEASE WITH MODERATE RCA STENOSES, MODERATE LCX STENOSIS, MILD LAD STENOSIS, AND SEVERE STENOSIS OF A SMALL FIRST DIAGONAL BRANCH  RECOMMENDATIONS:  IN MY OPINION THE PATIENT DOES NOT HAVE HIGH-RISK CORONARY ANATOMY WITH PATENCY OF HER LEFT MAIN AND LAD, AND NON-CRITICAL DISEASE INVOLVING THE LCX AND RCA. I THINK MEDICAL THERAPY IS APPROPRIATE. HER CARDIAC RISK OF SURGERY IS MODERATELY INCREASED BUT IS APPROPRIATE TO MANAGE MEDICALLY.  09/10/14 Nuclear stress test: Myocardial perfusion is abnormal. Moderate sized and intensity reversible anterior, apical and apical lateral perfusion defect. indings consistent with ischemia. This is an intermediate risk study. Overall left ventricular systolic function was abnormal. LV cavity size is normal. The left ventricular ejection fraction is mildly decreased (52%). A prior study was conducted on 10/05/2012. Compared to the prior study, there are changes. (Cardiac cath recommended, done 10/15/14.)  08/07/12  Echo: Study Conclusions - Left ventricle: The cavity size was normal. Wall thickness was increased in a pattern of mild LVH. Systolic function was normal. The estimated ejection fraction was in the range of 50% to 55%.  Wall motion was normal; there were no regional wall motion abnormalities. Features are consistent with a pseudonormal left ventricular filling pattern, with concomitant abnormal relaxation and increased filling pressure (grade 2 diastolic dysfunction). Doppler parameters are consistent with high ventricular filling pressure. - Mitral valve: Calcified annulus. - Left atrium: The atrium was moderately dilated. - Pulmonary arteries: Systolic pressure was mildly increased. PA peak pressure: 64mm Hg (S).  10/13/14 EKG: NSR, ST/T wave abnormality, consider anterolateral ischemia.   12/22/14 CXR: IMPRESSION: COPD. Chronic volume loss at the right lung base. There is no CHF nor pneumonia.  Preoperative labs noted.   If no acute changes then I anticipate that she can proceed as planned.  George Hugh [facility] Short Stay Center/Anesthesiology Phone [phone] 05/26/2015 3:22 PM

## 2015-06-02 ENCOUNTER — Encounter (HOSPITAL_COMMUNITY): Payer: Self-pay | Admitting: Surgery

## 2015-06-02 ENCOUNTER — Inpatient Hospital Stay (HOSPITAL_COMMUNITY): Payer: Commercial Managed Care - HMO | Admitting: Certified Registered Nurse Anesthetist

## 2015-06-02 ENCOUNTER — Inpatient Hospital Stay (HOSPITAL_COMMUNITY)
Admission: RE | Admit: 2015-06-02 | Discharge: 2015-06-10 | DRG: 330 | Disposition: A | Discharge status: Skilled Nursing Facility | Payer: Commercial Managed Care - HMO | Source: Ambulatory Visit | Attending: Surgery | Admitting: Surgery

## 2015-06-02 ENCOUNTER — Encounter (HOSPITAL_COMMUNITY)
Admission: RE | Disposition: A | Discharge status: Skilled Nursing Facility | Payer: Self-pay | Source: Ambulatory Visit | Attending: Surgery

## 2015-06-02 ENCOUNTER — Inpatient Hospital Stay (HOSPITAL_COMMUNITY): Payer: Commercial Managed Care - HMO | Admitting: Vascular Surgery

## 2015-06-02 DIAGNOSIS — E1122 Type 2 diabetes mellitus with diabetic chronic kidney disease: Secondary | ICD-10-CM | POA: Diagnosis present

## 2015-06-02 DIAGNOSIS — Z9862 Peripheral vascular angioplasty status: Secondary | ICD-10-CM

## 2015-06-02 DIAGNOSIS — I252 Old myocardial infarction: Secondary | ICD-10-CM

## 2015-06-02 DIAGNOSIS — Z79899 Other long term (current) drug therapy: Secondary | ICD-10-CM

## 2015-06-02 DIAGNOSIS — Z7951 Long term (current) use of inhaled steroids: Secondary | ICD-10-CM | POA: Diagnosis not present

## 2015-06-02 DIAGNOSIS — K565 Intestinal adhesions [bands] with obstruction (postprocedural) (postinfection): Secondary | ICD-10-CM | POA: Diagnosis present

## 2015-06-02 DIAGNOSIS — Z87891 Personal history of nicotine dependence: Secondary | ICD-10-CM

## 2015-06-02 DIAGNOSIS — Z933 Colostomy status: Secondary | ICD-10-CM

## 2015-06-02 DIAGNOSIS — N183 Chronic kidney disease, stage 3 (moderate): Secondary | ICD-10-CM | POA: Diagnosis present

## 2015-06-02 DIAGNOSIS — E785 Hyperlipidemia, unspecified: Secondary | ICD-10-CM | POA: Diagnosis present

## 2015-06-02 DIAGNOSIS — M797 Fibromyalgia: Secondary | ICD-10-CM | POA: Diagnosis present

## 2015-06-02 DIAGNOSIS — E875 Hyperkalemia: Secondary | ICD-10-CM | POA: Diagnosis not present

## 2015-06-02 DIAGNOSIS — I471 Supraventricular tachycardia: Secondary | ICD-10-CM | POA: Diagnosis not present

## 2015-06-02 DIAGNOSIS — Z955 Presence of coronary angioplasty implant and graft: Secondary | ICD-10-CM

## 2015-06-02 DIAGNOSIS — J449 Chronic obstructive pulmonary disease, unspecified: Secondary | ICD-10-CM | POA: Diagnosis present

## 2015-06-02 DIAGNOSIS — I472 Ventricular tachycardia: Secondary | ICD-10-CM | POA: Diagnosis not present

## 2015-06-02 DIAGNOSIS — Z8673 Personal history of transient ischemic attack (TIA), and cerebral infarction without residual deficits: Secondary | ICD-10-CM

## 2015-06-02 DIAGNOSIS — I739 Peripheral vascular disease, unspecified: Secondary | ICD-10-CM | POA: Diagnosis present

## 2015-06-02 DIAGNOSIS — I129 Hypertensive chronic kidney disease with stage 1 through stage 4 chronic kidney disease, or unspecified chronic kidney disease: Secondary | ICD-10-CM | POA: Diagnosis present

## 2015-06-02 DIAGNOSIS — Z7982 Long term (current) use of aspirin: Secondary | ICD-10-CM

## 2015-06-02 DIAGNOSIS — I2510 Atherosclerotic heart disease of native coronary artery without angina pectoris: Secondary | ICD-10-CM | POA: Diagnosis present

## 2015-06-02 DIAGNOSIS — Z433 Encounter for attention to colostomy: Principal | ICD-10-CM

## 2015-06-02 DIAGNOSIS — K219 Gastro-esophageal reflux disease without esophagitis: Secondary | ICD-10-CM | POA: Diagnosis present

## 2015-06-02 HISTORY — PX: PARTIAL COLECTOMY: SHX5273

## 2015-06-02 HISTORY — PX: COLOSTOMY CLOSURE: SHX1381

## 2015-06-02 LAB — GLUCOSE, CAPILLARY
GLUCOSE-CAPILLARY: 191 mg/dL — AB (ref 65–99)
Glucose-Capillary: 109 mg/dL — ABNORMAL HIGH (ref 65–99)
Glucose-Capillary: 153 mg/dL — ABNORMAL HIGH (ref 65–99)

## 2015-06-02 LAB — BLOOD GAS, ARTERIAL
Acid-base deficit: 9.3 mmol/L — ABNORMAL HIGH (ref 0.0–2.0)
BICARBONATE: 16.8 meq/L — AB (ref 20.0–24.0)
O2 Content: 3 L/min
O2 SAT: 94.6 %
PCO2 ART: 40.1 mmHg (ref 35.0–45.0)
PH ART: 7.243 — AB (ref 7.350–7.450)
PO2 ART: 85.7 mmHg (ref 80.0–100.0)
Patient temperature: 97.7
TCO2: 18.1 mmol/L (ref 0–100)

## 2015-06-02 LAB — CREATININE, SERUM
CREATININE: 1.62 mg/dL — AB (ref 0.44–1.00)
GFR, EST AFRICAN AMERICAN: 37 mL/min — AB (ref >60)
GFR, EST NON AFRICAN AMERICAN: 32 mL/min — AB (ref >60)

## 2015-06-02 LAB — CBC
HCT: 36.6 % (ref 36.0–46.0)
HEMOGLOBIN: 11.4 g/dL — AB (ref 12.0–15.0)
MCH: 30.6 pg (ref 26.0–34.0)
MCHC: 31.1 g/dL (ref 30.0–36.0)
MCV: 98.4 fL (ref 78.0–100.0)
Platelets: 210 10*3/uL (ref 150–400)
RBC: 3.72 MIL/uL — ABNORMAL LOW (ref 3.87–5.11)
RDW: 13.8 % (ref 11.5–15.5)
WBC: 20.7 10*3/uL — AB (ref 4.0–10.5)

## 2015-06-02 LAB — MRSA PCR SCREENING: MRSA by PCR: NEGATIVE

## 2015-06-02 SURGERY — COLOSTOMY CLOSURE
Anesthesia: General | Site: Abdomen

## 2015-06-02 MED ORDER — HYDROMORPHONE 1 MG/ML IV SOLN
INTRAVENOUS | Status: AC
  Filled 2015-06-02: qty 25

## 2015-06-02 MED ORDER — LIDOCAINE HCL (CARDIAC) 20 MG/ML IV SOLN
INTRAVENOUS | Status: AC
  Filled 2015-06-02: qty 5

## 2015-06-02 MED ORDER — PROMETHAZINE HCL 25 MG/ML IJ SOLN
6.2500 mg | INTRAMUSCULAR | Status: DC | PRN
Start: 2015-06-02 — End: 2015-06-02
  Administered 2015-06-02: 6.25 mg via INTRAVENOUS

## 2015-06-02 MED ORDER — METOPROLOL TARTRATE 12.5 MG HALF TABLET: 12.5000 mg | ORAL_TABLET | Freq: Two times a day (BID) | ORAL | Status: DC

## 2015-06-02 MED ORDER — ISOSORBIDE MONONITRATE ER 30 MG PO TB24
30.0000 mg | ORAL_TABLET | Freq: Every day | ORAL | Status: DC
  Administered 2015-06-03 – 2015-06-10 (×8): 30 mg via ORAL
  Filled 2015-06-02 (×9): qty 1

## 2015-06-02 MED ORDER — PROPOFOL 10 MG/ML IV BOLUS
INTRAVENOUS | Status: DC | PRN
  Administered 2015-06-02: 150 mg via INTRAVENOUS

## 2015-06-02 MED ORDER — EPHEDRINE SULFATE 50 MG/ML IJ SOLN
INTRAMUSCULAR | Status: AC
  Filled 2015-06-02: qty 1

## 2015-06-02 MED ORDER — BUDESONIDE-FORMOTEROL FUMARATE 160-4.5 MCG/ACT IN AERO
2.0000 | INHALATION_SPRAY | Freq: Two times a day (BID) | RESPIRATORY_TRACT | Status: DC
  Administered 2015-06-02 – 2015-06-09 (×11): 2 via RESPIRATORY_TRACT
  Filled 2015-06-02 (×2): qty 6

## 2015-06-02 MED ORDER — ROCURONIUM BROMIDE 50 MG/5ML IV SOLN
INTRAVENOUS | Status: AC
  Filled 2015-06-02: qty 1

## 2015-06-02 MED ORDER — MIDAZOLAM HCL 2 MG/2ML IJ SOLN
INTRAMUSCULAR | Status: AC
  Filled 2015-06-02: qty 2

## 2015-06-02 MED ORDER — ACETAMINOPHEN 325 MG PO TABS: 650.0000 mg | ORAL_TABLET | Freq: Four times a day (QID) | ORAL | Status: DC | PRN

## 2015-06-02 MED ORDER — ALVIMOPAN 12 MG PO CAPS
ORAL_CAPSULE | ORAL | Status: AC
  Administered 2015-06-02: 12 mg via ORAL
  Filled 2015-06-02: qty 1

## 2015-06-02 MED ORDER — ZOLPIDEM TARTRATE 5 MG PO TABS
5.0000 mg | ORAL_TABLET | Freq: Every evening | ORAL | Status: DC | PRN
  Administered 2015-06-04 – 2015-06-09 (×4): 5 mg via ORAL
  Filled 2015-06-02 (×4): qty 1

## 2015-06-02 MED ORDER — PROPOFOL 10 MG/ML IV BOLUS
INTRAVENOUS | Status: AC
  Filled 2015-06-02: qty 20

## 2015-06-02 MED ORDER — AMLODIPINE BESYLATE 10 MG PO TABS
10.0000 mg | ORAL_TABLET | Freq: Every day | ORAL | Status: DC
  Administered 2015-06-03 – 2015-06-10 (×8): 10 mg via ORAL
  Filled 2015-06-02 (×9): qty 1

## 2015-06-02 MED ORDER — ALVIMOPAN 12 MG PO CAPS
12.0000 mg | ORAL_CAPSULE | Freq: Two times a day (BID) | ORAL | Status: DC
  Administered 2015-06-03 – 2015-06-04 (×3): 12 mg via ORAL
  Filled 2015-06-02 (×4): qty 1

## 2015-06-02 MED ORDER — CETYLPYRIDINIUM CHLORIDE 0.05 % MT LIQD
7.0000 mL | Freq: Two times a day (BID) | OROMUCOSAL | Status: DC
  Administered 2015-06-03 – 2015-06-06 (×8): 7 mL via OROMUCOSAL

## 2015-06-02 MED ORDER — ALUM & MAG HYDROXIDE-SIMETH 200-200-20 MG/5ML PO SUSP
30.0000 mL | Freq: Four times a day (QID) | ORAL | Status: DC | PRN
  Administered 2015-06-06 – 2015-06-09 (×3): 30 mL via ORAL
  Filled 2015-06-02 (×3): qty 30

## 2015-06-02 MED ORDER — ONDANSETRON HCL 4 MG/2ML IJ SOLN
INTRAMUSCULAR | Status: DC | PRN
  Administered 2015-06-02: 4 mg via INTRAVENOUS

## 2015-06-02 MED ORDER — PHENYLEPHRINE HCL 10 MG/ML IJ SOLN
10.0000 mg | INTRAVENOUS | Status: DC | PRN
  Administered 2015-06-02: 20 ug/min via INTRAVENOUS

## 2015-06-02 MED ORDER — METOPROLOL TARTRATE 1 MG/ML IV SOLN
5.0000 mg | Freq: Four times a day (QID) | INTRAVENOUS | Status: DC
  Administered 2015-06-02 – 2015-06-07 (×18): 5 mg via INTRAVENOUS
  Filled 2015-06-02 (×23): qty 5

## 2015-06-02 MED ORDER — TIOTROPIUM BROMIDE MONOHYDRATE 18 MCG IN CAPS
18.0000 ug | ORAL_CAPSULE | Freq: Every day | RESPIRATORY_TRACT | Status: DC
Start: 2015-06-02 — End: 2015-06-10
  Administered 2015-06-03 – 2015-06-08 (×5): 18 ug via RESPIRATORY_TRACT
  Filled 2015-06-02 (×2): qty 5

## 2015-06-02 MED ORDER — SODIUM CHLORIDE 0.9 % IV SOLN
INTRAVENOUS | Status: DC | PRN
  Administered 2015-06-02: 10:00:00 via INTRAVENOUS

## 2015-06-02 MED ORDER — FENTANYL CITRATE (PF) 100 MCG/2ML IJ SOLN
INTRAMUSCULAR | Status: DC | PRN
  Administered 2015-06-02 (×5): 50 ug via INTRAVENOUS

## 2015-06-02 MED ORDER — ENOXAPARIN SODIUM 40 MG/0.4ML ~~LOC~~ SOLN
40.0000 mg | SUBCUTANEOUS | Status: DC
  Administered 2015-06-03 – 2015-06-10 (×8): 40 mg via SUBCUTANEOUS
  Filled 2015-06-02 (×9): qty 0.4

## 2015-06-02 MED ORDER — FENTANYL CITRATE (PF) 250 MCG/5ML IJ SOLN
INTRAMUSCULAR | Status: AC
  Filled 2015-06-02: qty 5

## 2015-06-02 MED ORDER — DIPHENHYDRAMINE HCL 12.5 MG/5ML PO ELIX: 12.5000 mg | ORAL_SOLUTION | Freq: Four times a day (QID) | ORAL | Status: DC | PRN

## 2015-06-02 MED ORDER — ALBUTEROL SULFATE (2.5 MG/3ML) 0.083% IN NEBU: 3.0000 mL | INHALATION_SOLUTION | Freq: Four times a day (QID) | RESPIRATORY_TRACT | Status: DC | PRN

## 2015-06-02 MED ORDER — PROMETHAZINE HCL 25 MG/ML IJ SOLN
INTRAMUSCULAR | Status: AC
  Filled 2015-06-02: qty 1

## 2015-06-02 MED ORDER — 0.9 % SODIUM CHLORIDE (POUR BTL) OPTIME
TOPICAL | Status: DC | PRN
  Administered 2015-06-02 (×3): 1000 mL

## 2015-06-02 MED ORDER — ALVIMOPAN 12 MG PO CAPS
12.0000 mg | ORAL_CAPSULE | Freq: Once | ORAL | Status: AC
  Administered 2015-06-02: 12 mg via ORAL

## 2015-06-02 MED ORDER — HYDROMORPHONE HCL 1 MG/ML IJ SOLN
INTRAMUSCULAR | Status: AC
  Administered 2015-06-02: 0.5 mg via INTRAVENOUS
  Filled 2015-06-02: qty 1

## 2015-06-02 MED ORDER — SUCCINYLCHOLINE CHLORIDE 20 MG/ML IJ SOLN
INTRAMUSCULAR | Status: DC | PRN
  Administered 2015-06-02: 100 mg via INTRAVENOUS

## 2015-06-02 MED ORDER — CLINDAMYCIN PHOSPHATE 900 MG/50ML IV SOLN: 900.0000 mg | INTRAVENOUS | Status: DC

## 2015-06-02 MED ORDER — PRAVASTATIN SODIUM 40 MG PO TABS
80.0000 mg | ORAL_TABLET | Freq: Every day | ORAL | Status: DC
  Administered 2015-06-02 – 2015-06-09 (×8): 80 mg via ORAL
  Filled 2015-06-02 (×8): qty 2

## 2015-06-02 MED ORDER — HYDROMORPHONE 1 MG/ML IV SOLN
INTRAVENOUS | Status: DC
  Administered 2015-06-02: 0.9 mg via INTRAVENOUS
  Administered 2015-06-02: 13:00:00 via INTRAVENOUS
  Administered 2015-06-02: 0 mg via INTRAVENOUS
  Administered 2015-06-03: 0.9 mg via INTRAVENOUS
  Administered 2015-06-03 (×2): 0.3 mg via INTRAVENOUS
  Administered 2015-06-03: 1.9 mg via INTRAVENOUS
  Administered 2015-06-04: 0 mg via INTRAVENOUS
  Administered 2015-06-04 (×3): 1.2 mg via INTRAVENOUS
  Administered 2015-06-04: 0.4 mg via INTRAVENOUS
  Administered 2015-06-05: 0.9 mg via INTRAVENOUS
  Administered 2015-06-05: 0.6 mg via INTRAVENOUS
  Administered 2015-06-05: 0.3 mg via INTRAVENOUS

## 2015-06-02 MED ORDER — HYDROMORPHONE HCL 1 MG/ML IJ SOLN
0.2500 mg | INTRAMUSCULAR | Status: AC
  Administered 2015-06-02 (×2): 0.5 mg via INTRAVENOUS

## 2015-06-02 MED ORDER — DIPHENHYDRAMINE HCL 50 MG/ML IJ SOLN: 12.5000 mg | Freq: Four times a day (QID) | INTRAMUSCULAR | Status: DC | PRN

## 2015-06-02 MED ORDER — KCL IN DEXTROSE-NACL 20-5-0.9 MEQ/L-%-% IV SOLN
INTRAVENOUS | Status: DC
  Administered 2015-06-02: 100 mL/h via INTRAVENOUS
  Administered 2015-06-03: 04:00:00 via INTRAVENOUS
  Filled 2015-06-02 (×8): qty 1000

## 2015-06-02 MED ORDER — DIPHENHYDRAMINE HCL 50 MG/ML IJ SOLN
12.5000 mg | Freq: Four times a day (QID) | INTRAMUSCULAR | Status: DC | PRN
  Administered 2015-06-03: 12.5 mg via INTRAVENOUS
  Filled 2015-06-02: qty 1

## 2015-06-02 MED ORDER — CLINDAMYCIN PHOSPHATE 900 MG/50ML IV SOLN
INTRAVENOUS | Status: AC
  Administered 2015-06-02: 900 mg via INTRAVENOUS
  Filled 2015-06-02: qty 50

## 2015-06-02 MED ORDER — SUCCINYLCHOLINE CHLORIDE 20 MG/ML IJ SOLN
INTRAMUSCULAR | Status: AC
  Filled 2015-06-02: qty 1

## 2015-06-02 MED ORDER — ONDANSETRON HCL 4 MG/2ML IJ SOLN
4.0000 mg | Freq: Four times a day (QID) | INTRAMUSCULAR | Status: DC | PRN
  Administered 2015-06-02: 4 mg via INTRAVENOUS
  Filled 2015-06-02: qty 2

## 2015-06-02 MED ORDER — ONDANSETRON HCL 4 MG/2ML IJ SOLN
4.0000 mg | Freq: Four times a day (QID) | INTRAMUSCULAR | Status: DC | PRN
  Administered 2015-06-02 – 2015-06-08 (×5): 4 mg via INTRAVENOUS
  Filled 2015-06-02 (×5): qty 2

## 2015-06-02 MED ORDER — SUGAMMADEX SODIUM 200 MG/2ML IV SOLN
INTRAVENOUS | Status: AC
  Filled 2015-06-02: qty 2

## 2015-06-02 MED ORDER — PHENYLEPHRINE HCL 10 MG/ML IJ SOLN
INTRAMUSCULAR | Status: AC
  Filled 2015-06-02: qty 1

## 2015-06-02 MED ORDER — HYDROMORPHONE HCL 1 MG/ML IJ SOLN: 0.2500 mg | INTRAMUSCULAR | Status: DC | PRN

## 2015-06-02 MED ORDER — SUGAMMADEX SODIUM 200 MG/2ML IV SOLN
INTRAVENOUS | Status: DC | PRN
  Administered 2015-06-02: 150 mg via INTRAVENOUS

## 2015-06-02 MED ORDER — MIDAZOLAM HCL 5 MG/5ML IJ SOLN
INTRAMUSCULAR | Status: DC | PRN
  Administered 2015-06-02: 1 mg via INTRAVENOUS

## 2015-06-02 MED ORDER — ONDANSETRON HCL 4 MG/2ML IJ SOLN
INTRAMUSCULAR | Status: AC
  Filled 2015-06-02: qty 2

## 2015-06-02 MED ORDER — SODIUM CHLORIDE 0.9% FLUSH: 9.0000 mL | INTRAVENOUS | Status: DC | PRN

## 2015-06-02 MED ORDER — LIDOCAINE HCL (CARDIAC) 20 MG/ML IV SOLN
INTRAVENOUS | Status: DC | PRN
  Administered 2015-06-02: 80 mg via INTRAVENOUS

## 2015-06-02 MED ORDER — EPHEDRINE SULFATE 50 MG/ML IJ SOLN
INTRAMUSCULAR | Status: DC | PRN
  Administered 2015-06-02 (×2): 5 mg via INTRAVENOUS

## 2015-06-02 MED ORDER — ONDANSETRON HCL 4 MG PO TABS: 4.0000 mg | ORAL_TABLET | Freq: Four times a day (QID) | ORAL | Status: DC | PRN

## 2015-06-02 MED ORDER — SACCHAROMYCES BOULARDII 250 MG PO CAPS
250.0000 mg | ORAL_CAPSULE | Freq: Two times a day (BID) | ORAL | Status: DC
  Administered 2015-06-02 – 2015-06-10 (×16): 250 mg via ORAL
  Filled 2015-06-02 (×17): qty 1

## 2015-06-02 MED ORDER — ROCURONIUM BROMIDE 100 MG/10ML IV SOLN
INTRAVENOUS | Status: DC | PRN
  Administered 2015-06-02: 30 mg via INTRAVENOUS
  Administered 2015-06-02: 5 mg via INTRAVENOUS

## 2015-06-02 MED ORDER — ACETAMINOPHEN 500 MG PO TABS
1000.0000 mg | ORAL_TABLET | Freq: Four times a day (QID) | ORAL | Status: AC
  Administered 2015-06-03 (×3): 1000 mg via ORAL
  Filled 2015-06-02 (×3): qty 2

## 2015-06-02 MED ORDER — SODIUM CHLORIDE 0.9 % IV SOLN: INTRAVENOUS | Status: DC | PRN

## 2015-06-02 MED ORDER — LACTATED RINGERS IV SOLN
INTRAVENOUS | Status: DC | PRN
  Administered 2015-06-02: 08:00:00 via INTRAVENOUS

## 2015-06-02 MED ORDER — GENTAMICIN SULFATE 40 MG/ML IJ SOLN
5.0000 mg/kg | INTRAVENOUS | Status: AC
  Administered 2015-06-02: 304 mg via INTRAVENOUS
  Filled 2015-06-02: qty 7.5

## 2015-06-02 MED ORDER — NALOXONE HCL 0.4 MG/ML IJ SOLN: 0.4000 mg | INTRAMUSCULAR | Status: DC | PRN

## 2015-06-02 SURGICAL SUPPLY — 75 items
BNDG GAUZE ELAST 4 BULKY (GAUZE/BANDAGES/DRESSINGS) ×1 IMPLANT
CANISTER SUCTION 2500CC (MISCELLANEOUS) ×2 IMPLANT
COVER MAYO STAND STRL (DRAPES) ×4 IMPLANT
COVER SURGICAL LIGHT HANDLE (MISCELLANEOUS) ×4 IMPLANT
DRAPE LAPAROSCOPIC ABDOMINAL (DRAPES) ×2 IMPLANT
DRAPE PROXIMA HALF (DRAPES) ×2 IMPLANT
DRAPE UTILITY XL STRL (DRAPES) ×6 IMPLANT
DRAPE WARM FLUID 44X44 (DRAPE) ×2 IMPLANT
DRSG OPSITE POSTOP 4X10 (GAUZE/BANDAGES/DRESSINGS) IMPLANT
DRSG OPSITE POSTOP 4X8 (GAUZE/BANDAGES/DRESSINGS) IMPLANT
ELECT BLADE 6.5 EXT (BLADE) ×2 IMPLANT
ELECT CAUTERY BLADE 6.4 (BLADE) ×4 IMPLANT
ELECT REM PT RETURN 9FT ADLT (ELECTROSURGICAL) ×2
ELECTRODE REM PT RTRN 9FT ADLT (ELECTROSURGICAL) ×1 IMPLANT
GAUZE SPONGE 4X4 12PLY STRL (GAUZE/BANDAGES/DRESSINGS) ×1 IMPLANT
GEL ULTRASOUND 20GR AQUASONIC (MISCELLANEOUS) ×2 IMPLANT
GLOVE BIO SURGEON STRL SZ 6 (GLOVE) ×2 IMPLANT
GLOVE BIO SURGEON STRL SZ 6.5 (GLOVE) ×3 IMPLANT
GLOVE BIO SURGEON STRL SZ8 (GLOVE) ×8 IMPLANT
GLOVE BIOGEL PI IND STRL 6.5 (GLOVE) IMPLANT
GLOVE BIOGEL PI IND STRL 7.5 (GLOVE) IMPLANT
GLOVE BIOGEL PI IND STRL 8 (GLOVE) ×2 IMPLANT
GLOVE BIOGEL PI IND STRL 8.5 (GLOVE) IMPLANT
GLOVE BIOGEL PI INDICATOR 6.5 (GLOVE) ×1
GLOVE BIOGEL PI INDICATOR 7.5 (GLOVE) ×2
GLOVE BIOGEL PI INDICATOR 8 (GLOVE) ×2
GLOVE BIOGEL PI INDICATOR 8.5 (GLOVE) ×2
GLOVE ECLIPSE 7.5 STRL STRAW (GLOVE) ×3 IMPLANT
GLOVE INDICATOR 6.5 STRL GRN (GLOVE) ×1 IMPLANT
GOWN STRL REUS W/ TWL LRG LVL3 (GOWN DISPOSABLE) ×4 IMPLANT
GOWN STRL REUS W/ TWL XL LVL3 (GOWN DISPOSABLE) ×2 IMPLANT
GOWN STRL REUS W/TWL LRG LVL3 (GOWN DISPOSABLE)
GOWN STRL REUS W/TWL XL LVL3 (GOWN DISPOSABLE) ×8
KIT BASIN OR (CUSTOM PROCEDURE TRAY) ×2 IMPLANT
KIT ROOM TURNOVER OR (KITS) ×2 IMPLANT
LEGGING LITHOTOMY PAIR STRL (DRAPES) ×1 IMPLANT
LIGASURE IMPACT 36 18CM CVD LR (INSTRUMENTS) ×1 IMPLANT
NS IRRIG 1000ML POUR BTL (IV SOLUTION) ×5 IMPLANT
PACK GENERAL/GYN (CUSTOM PROCEDURE TRAY) ×2 IMPLANT
PAD ABD 8X10 STRL (GAUZE/BANDAGES/DRESSINGS) ×1 IMPLANT
PAD ARMBOARD 7.5X6 YLW CONV (MISCELLANEOUS) ×2 IMPLANT
PENCIL BUTTON HOLSTER BLD 10FT (ELECTRODE) ×2 IMPLANT
RELOAD PROXIMATE 75MM BLUE (ENDOMECHANICALS) ×12 IMPLANT
RELOAD PROXIMATE TA60MM BLUE (ENDOMECHANICALS) ×2 IMPLANT
RELOAD STAPLE 60 BLU REG PROX (ENDOMECHANICALS) IMPLANT
RELOAD STAPLE 75 3.8 BLU REG (ENDOMECHANICALS) IMPLANT
RELOAD STAPLER LINEAR PROX 30 (STAPLE) IMPLANT
SPECIMEN JAR LARGE (MISCELLANEOUS) ×1 IMPLANT
SPECIMEN JAR MEDIUM (MISCELLANEOUS) ×2 IMPLANT
SPONGE LAP 18X18 X RAY DECT (DISPOSABLE) ×1 IMPLANT
STAPLER CIRC CVD 29MM 37CM (STAPLE) ×1 IMPLANT
STAPLER CUT CVD 40MM BLUE (STAPLE) ×2 IMPLANT
STAPLER GUN LINEAR PROX 60 (STAPLE) ×1 IMPLANT
STAPLER PROXIMATE 75MM BLUE (STAPLE) ×1 IMPLANT
STAPLER RELOAD LINEAR PROX 30 (STAPLE)
STAPLER RELOADABLE 30 BLU REG (STAPLE) IMPLANT
STAPLER VISISTAT 35W (STAPLE) ×2 IMPLANT
SUCTION POOLE TIP (SUCTIONS) ×2 IMPLANT
SURGILUBE 2OZ TUBE FLIPTOP (MISCELLANEOUS) ×2 IMPLANT
SUT PDS AB 1 TP1 96 (SUTURE) ×4 IMPLANT
SUT PROLENE 2 0 CT2 30 (SUTURE) ×1 IMPLANT
SUT PROLENE 2 0 KS (SUTURE) IMPLANT
SUT SILK 3 0 TIES 10X30 (SUTURE) ×1 IMPLANT
SUT VIC AB 2-0 SH 18 (SUTURE) ×4 IMPLANT
SUT VIC AB 3-0 SH 18 (SUTURE) ×3 IMPLANT
SUT VICRYL AB 2 0 TIES (SUTURE) ×2 IMPLANT
SUT VICRYL AB 3 0 TIES (SUTURE) ×2 IMPLANT
SYR BULB IRRIGATION 50ML (SYRINGE) ×2 IMPLANT
TAPE CLOTH SURG 4X10 WHT LF (GAUZE/BANDAGES/DRESSINGS) ×1 IMPLANT
TOWEL OR 17X26 10 PK STRL BLUE (TOWEL DISPOSABLE) ×3 IMPLANT
TRAY FOLEY CATH 16FRSI W/METER (SET/KITS/TRAYS/PACK) ×1 IMPLANT
TRAY PROCTOSCOPIC FIBER OPTIC (SET/KITS/TRAYS/PACK) ×1 IMPLANT
TUBE CONNECTING 12X1/4 (SUCTIONS) ×2 IMPLANT
UNDERPAD 30X30 INCONTINENT (UNDERPADS AND DIAPERS) ×1 IMPLANT
YANKAUER SUCT BULB TIP NO VENT (SUCTIONS) ×2 IMPLANT

## 2015-06-02 NOTE — Transfer of Care (Signed)
Immediate Anesthesia Transfer of Care Note  Patient: Alexa Key  Procedure(s) Performed: Procedure(s): COLOSTOMY CLOSURE (N/A) PARTIAL COLECTOMY (N/A)  Patient Location: PACU  Anesthesia Type:General  Level of Consciousness: awake, alert , oriented, patient cooperative and responds to stimulation  Airway & Oxygen Therapy: Patient Spontanous Breathing and Patient connected to face mask oxygen  Post-op Assessment: Report given to RN, Post -op Vital signs reviewed and stable and Patient moving all extremities X 4  Post vital signs: Reviewed and stable  Last Vitals:  Filed Vitals:   06/02/15 0639  BP: 185/73  Pulse: 68  Temp: 36.1 C  Resp: 20    Complications: No apparent anesthesia complications

## 2015-06-02 NOTE — Anesthesia Procedure Notes (Signed)
Procedure Name: Intubation Date/Time: 06/02/2015 7:45 AM Performed by: Tressia Miners LEFFEW Pre-anesthesia Checklist: Patient identified, Patient being monitored, Timeout performed, Emergency Drugs available and Suction available Patient Re-evaluated:Patient Re-evaluated prior to inductionOxygen Delivery Method: Circle System Utilized Preoxygenation: Pre-oxygenation with 100% oxygen Intubation Type: IV induction Ventilation: Mask ventilation without difficulty Laryngoscope Size: Mac and 3 Grade View: Grade I Tube type: Oral Tube size: 7.0 mm Number of attempts: 1 Airway Equipment and Method: Stylet Placement Confirmation: ETT inserted through vocal cords under direct vision,  positive ETCO2 and breath sounds checked- equal and bilateral Secured at: 23 cm Tube secured with: Tape Dental Injury: Teeth and Oropharynx as per pre-operative assessment

## 2015-06-02 NOTE — Interval H&P Note (Signed)
History and Physical Interval Note:  06/02/2015 7:08 AM  Alexa Key  has presented today for surgery, with the diagnosis of COLOSTOMY IN PLACE  The various methods of treatment have been discussed with the patient and family. After consideration of risks, benefits and other options for treatment, the patient has consented to  Procedure(s): COLOSTOMY CLOSURE (N/A) as a surgical intervention .  The patient's history has been reviewed, patient examined, no change in status, stable for surgery.  I have reviewed the patient's chart and labs.  Questions were answered to the patient's satisfaction.     Pt seen examined and no changes to H/P noted.  She has been cleared from cardiology and pulmonary function looks stable.  Took her prep but ate prior to midnight.  Her ostomy output looks relatively liquid so I don't think this will be an issue. Discussed with her the procedure,  Recovery and  Long tem expectations. The procedure was discussed with the patient. Colostomy closure  discussed with the patient as well as non operative treatments. The risks of operative management include bleeding,  Infection,  Leak of anastamosis,  Ostomy formation, open procedure,  Sepsis,  Abcess,  Hernia,  DVT,  Pulmonary complications, DEATH,   Cardiovascular  complications,  Injury to ureter,  Bladder,kidney,and anesthesia risks,  And death. The patient understands.  Questions answered.   The success of the procedure is 50-85 % for treating the patients symptoms. She  agrees to proceed.  Shanautica Forker A.

## 2015-06-02 NOTE — Anesthesia Preprocedure Evaluation (Addendum)
Anesthesia Evaluation  Patient identified by MRN, date of birth, ID band Patient awake    Reviewed: Allergy & Precautions, NPO status , Patient's Chart, lab work & pertinent test results  History of Anesthesia Complications (+) PONV  Airway Mallampati: II  TM Distance: >3 FB Neck ROM: Full    Dental  (+) Dental Advisory Given   Pulmonary COPD,  COPD inhaler, Current Smoker, former smoker,  S/P partial lobectomy 1997   breath sounds clear to auscultation       Cardiovascular hypertension, Pt. on medications and Pt. on home beta blockers + CAD, + Past MI and + Peripheral Vascular Disease   Rhythm:Regular Rate:Normal  Cardiology note from Dr. [place] - CAD with intermediate risk Myoview. Patient is asymptomatic from a cardiac standpoint. She was originally scheduled for outpatient cath but developed protracted N/V and was admitted. She has a rectovaginal fistula and partial colectomy planned. Per Dr. Allison Quarry note yesterday Cardiac cath planned today to further assess risk but this would be unlikely to change therapy. Today she has significant decline in renal function with creatinine 1.2>>2.59 in 2 days. Cardiac cath cancelled. At this point I do not see an advantage to doing a cardiac cath in the acute setting. I think her cardiac risk is moderate based on noninvasive evaluation and since she is asymptomatic I would recommend proceeding with surgery if needed. Cardiac cath would likely only increase the risk of renal failure and bad outcomes. Perhaps in the future when she has recovered from surgery and renal function is stable cardiac cath could be considered to assess her long term risk.  EF 45-54%   Neuro/Psych Anxiety CVA    GI/Hepatic GERD  Medicated,  Endo/Other  diabetes  Renal/GU ARFRenal disease     Musculoskeletal  (+) Fibromyalgia -  Abdominal (+) + obese,   Peds  Hematology  (+) anemia , H/H 10.7/33.6    Anesthesia Other Findings   Reproductive/Obstetrics                           Anesthesia Physical Anesthesia Plan  ASA: III  Anesthesia Plan: General   Post-op Pain Management:    Induction: Intravenous  Airway Management Planned: Oral ETT  Additional Equipment: Arterial line  Intra-op Plan:   Post-operative Plan: Extubation in OR and Possible Post-op intubation/ventilation  Informed Consent: I have reviewed the patients History and Physical, chart, labs and discussed the procedure including the risks, benefits and alternatives for the proposed anesthesia with the patient or authorized representative who has indicated his/her understanding and acceptance.   Dental advisory given  Plan Discussed with: CRNA and Surgeon  Anesthesia Plan Comments:       Anesthesia Quick Evaluation

## 2015-06-02 NOTE — Op Note (Signed)
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  Asst. Dr. Barry Dienes M.D.  Anesthesia: Gen. 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 She underwent emergent colectomy with colostomy secondary to fistula and stricture. She has recovered from this underwent preoperative barium enema and colonoscopy. She presents for closure of her colostomy. We discussed the procedure at great length and discussed her increased operative risk due to her multiple medical problems. We discussed her risk of complication to be higher than baseline given her multiple medical problems. We discussed keeping her colostomy has alternative surgery. She had no desire to keep the colostomy understood the potential risks of this procedure.The procedure was discussed with the patient.  Colostomy  discussed with the patient as well as non operative treatments. The risks of operative management include bleeding,  Infection,  Leak of anastamosis,  Ostomy formation, open procedure, bowel resection,   Sepsis,  Abcess,  Hernia,  DVT,  Pulmonary complications,  Cardiovascular  complications,  Injury to ureter,  Bladder,kidney,and anesthesia risks,  And death. The patient understands.  Questions answered.   The success of the procedure is 50-85 % for treating the patients symptoms.  They agree to proceed.  Description of procedure: The patient was met in the holding area and questions are answered. The procedure was reviewed with her again as well as her daughter at the bedside. She was taken back to the operating room and placed supine on the OR table. After induction of general endotracheal anesthesia a Foley catheter is placed under sterile conditions. A Pershing suture 2-0 Vicryl was placed in the colostomy. She was in place in lithotomy. The abdomen and perineum were prepped and draped in a sterile fashion. Timeout was done and she received preoperative antibiotics. Midline incision was used and the old scar was excised. We dissected to her midline fascia and opened the abdominal cavity. Her fascia was quite thin and flimsy. We then spent the next 2 hours lysing adhesions due to her dense intra-abdominal lesions especially in the left lower quadrant where her previous stricture, fistula and abscess were. There are 2 segments of small bowel that were strictured and one had a sterile abscess associated with it. I elected after mobilizing the small bowel to resect these areas since they were not healthy-appearing. The first was in the distal jejunum was approximately 15-20 cm in length. It was resected using a GIA 75 stapler 2. A side-to-side functional end anastomosis was created using the GIA 75 stapler and a TA 60 stapler close the common enterotomy. The mesenteric defect was closed with 2-0 Vicryl. Crotch stitch put in of 2-0 Vicryl. There is no evidence of leakage in the anastomosis had no tension was widely patent. The second area small intestine that was resected was the proximal ileum. This was about 30 cm in length and was quite unhealthy looking secondary to stricture from adhesions. He was resected in a similar fashion and a similar  anastomosis was created. After the small bowel anastomoses were complete we did work the next 30 minutes in the pelvis dissecting out the rectal  stump which had 2 sutures identifying it. It was densely adherent and this took quite some time. We dissected out the stump circumferentially. The ureter was identified it was densely adherent to the stumps we carefully dissected the ureter off of the rectal stump do with dense adhesions. Care was taken not to injure the ureter and the entire ureter in the operative field could be seen. After stop was dissected off the was carefully examined there is no evidence of any injury hernia cautery cautery artifact on the left ureter. The iliac vessels were protected as well. I then trimmed off the excess stump that did not appear healthy with a contour stapler. This freshened up the rectal stump nicely. The colostomy was then taken down from the abdominal wall with cautery brought back in the abdominal cavity. I trimmed off the distal port of the colon mobilized the splenic flexure until fully mobile. This gave me adequate length to reach into the pelvis. Rigid sigmoidoscope was done the rectal stump was examined and was airtight prior to anastomosis. Of note her tissues were very friable and thin and difficult to work with. I then put a pursestring applier onto the proximal colon and passed a 2-0 Prolene through it for pursestring suture. The excess bowel was cut off with good bleeding. And was placed using a EEA sizer of 29. This was tied down. We then pull rectum passed the dilator without difficulty. The stapler was and passed per rectum and the spike deployed just above the staple line. We then made sure we applied the intervals between the colon was not twisted carefully close this down protecting the left ureter and the vaginal cough. The bladder was also out of the operative field. We used malleable retractors for this. We then closed stapler and fired it. We then removed the stapler and clamped the proximal: To insufflate the anastomosis. This was insufflated fully with no evidence of air leakage. We then  visualized the bowel there is no evidence of any bleeding using rigid sigmoidoscopy. We removed the air. The colon was not twisted there is no tension whatsoever on the anastomosis. Irrigation was used and suctioned out until clear. All sponges were removed. These were counted and found to be correct. I closed the ostomy site using #1 Novafil. The fascia was closed with running double-stranded PDS. Nasogastric tube was placed with good positioning prior to closure. Given her poor tissue quality I elected to pack her subcutaneous tissues with Curlex and dry dressings. All final counts were counted again and found to be correct. The patient was in awoke, taken to recovery in satisfactory condition.

## 2015-06-02 NOTE — H&P (Signed)
H&P   Alexa Key (MR# OF:[id number])      H&P Info    Author Note Status Last Update User Last Update Date/Time   Alexa Luna, MD Signed Alexa Luna, MD     H&P    Expand All Collapse All   etty Key. [facility] 02/23/2015 3:47 PM Location: [facility] Patient #: [id number] DOB: 1950-08-22 Divorced / Language: Alexa Key / Race: White Female  History of Present Illness  Patient words: discuss colstomy reversal; BE and colonoscopy completed   Patient is here for follow-up to discuss colostomy closure. 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 She had a barium enema which showed a rectal stump and colon to be otherwise normal without complicating feature. She is ready for colostomy closure.       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 Preoperative evaluation prior to colostomy reversal.  EXAM: WATER SOLUBLE CONTRAST ENEMA  TECHNIQUE: Initially, a 31 French Foley catheter was placed into the rectum and a water-soluble contrast evaluation of the Hartmann pouch was performed. After draining the rectum, colostomy enema was performed. Spot images and overhead radiographs were obtained.  FLUOROSCOPY TIME: Radiation Exposure Index (as provided by the fluoroscopic device): 162 dGy per cm squared  If the device does not provide the exposure index:  Fluoroscopy Time: 3 min 12 sec  Number of Acquired Images: 2  COMPARISON: No prior fluoroscopic imaging. CT abdomen and pelvis 10/16/2014 dating back to 07/04/2014.  FINDINGS: Preliminary scout AP supine abdominal image demonstrates a normal bowel gas pattern. Aorto-iliofemoral and visceral artery atherosclerosis.  The Hartmann pouch has a normal appearance, and ends at the level of the upper rectum.  The colostomy enema demonstrates a normal appearance of the descending colon, transverse colon, ascending colon and  rectum. The appendix fills normally. The terminal ileum filled via an incompetent ileocecal valve and also appears normal.  IMPRESSION: 1. Normal-appearing Hartmann pouch, ending at the level of the upper rectum. 2. Normal colostomy enema. 3. 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   Electronically Signed By: Alexa Key M.D. On: 01/23/2015 11:31.  The patient is a 65 year old female.   Problem List/Past Medical  VAGINAL DISCHARGE (N89.8) POST-OPERATIVE STATE [phone])  Other Problems  Oophorectomy Bilateral. Myocardial infarction Kidney Stone General anesthesia - complications Gastroesophageal Reflux Disease Hypercholesterolemia High blood pressure Diabetes Mellitus Back Pain Chronic Obstructive Lung Disease Cerebrovascular Accident SCREENING FOR COLORECTAL CANCER (Z12.11, Z12.12)  Past Surgical History  Spinal Surgery - Lower Back Lung Surgery Right. Hysterectomy (not due to cancer) - Complete Carotid Artery Surgery Left.  Diagnostic Studies History  Pap Smear 1-5 years ago Mammogram >3 years ago  Allergies  Sulfa 10 *OPHTHALMIC AGENTS* Penicillin V Potassium *PENICILLINS* Penicillin V *PENICILLINS*  Medication History  Fenofibrate (160MG  Tablet, Oral) Active. Losartan Potassium (100MG  Tablet, Oral) Active. Nitrofurantoin Monohyd Macro (100MG  Capsule, Oral) Active. Omeprazole (20MG  Capsule DR, Oral) Active. Vitamin C ER (500MG  Capsule ER, Oral daily) Active. AmLODIPine Besylate (10MG  Tablet, Oral daily) Active. Isosorbide Mononitrate ER (30MG  Tablet ER 24HR, Oral daily) Active. Spiriva HandiHaler (18MCG Capsule, Inhalation daily) Active. Ventolin HFA (108 (90 Base)MCG/ACT Aerosol Soln, Inhalation daily) Active. Zinc Gluconate (50MG  Tablet, Oral daily) Active. Aspirin (325MG  Tablet, Oral daily) Active. Budesonide (Inhalation) (200MCG/INH Aerosol Powder,  Inhalation daily) Active. Vitamin D High Potency (1000UNIT Capsule, Oral daily) Active. Lopressor (50MG  Tablet, 1/2 Oral daily) Active. Pravastatin Sodium (80MG  Tablet, Oral daily)  Active. Florastor Kids (250MG  Packet, Oral daily) Active. Multiple Vitamins (Oral daily) Active. Zanaflex (4MG  Capsule, Oral as needed) Active. Tylenol Extra Strength (500MG  Tablet, Oral as needed) Active. MiraLax (Oral daily) Active. Ambien (5MG  Tablet, Oral daily) Active. Medications Reconciled  Social History  Tobacco use Current every day smoker. No drug use Caffeine use Carbonated beverages, Coffee, Tea. Alcohol use Remotely quit alcohol use.  Family History  Alcohol Abuse Father. Kidney Disease Mother. Hypertension Daughter. Respiratory Condition Sister.  Pregnancy / Birth History  Age at menarche 37 years. Gravida 2 Age of menopause <43 Para 2 Irregular periods    Vitals  02/23/2015 3:47 PM Weight: 134.38 lb Height: 61in Body Surface Area: 1.6 m Body Mass Index: 25.39 kg/m  BP: 136/80 (Sitting, Left Arm, Standard)      Physical Exam   General Mental Status-Alert. General Appearance-Consistent with stated age. Hydration-Well hydrated. Voice-Normal.  Chest and Lung Exam Chest and lung exam reveals -quiet, even and easy respiratory effort with no use of accessory muscles and on auscultation, normal breath sounds, no adventitious sounds and normal vocal resonance. Inspection Chest Wall - Normal. Back - normal.  Cardiovascular Cardiovascular examination reveals -normal heart sounds, regular rate and rhythm with no murmurs and normal pedal pulses bilaterally.  Abdomen Note: Left lower quadrant ostomy functioning well. Midline incision well-healed. No rebound or guarding.    Assessment & Plan   COLOSTOMY IN PLACE (Z93.3) Impression: Discuss closure with her. Risk of colostomy include bleeding, infection, leak of anastamosis,  death, colostomy, organ injury, kidney injury, ureter injury, bladder injury, SBO, and need for other surgery. Patient high risk for bowel resection and/or repair from previous surgery. Pt agrees to proceed.  Current Plans The anatomy & physiology of the digestive tract was discussed. The pathophysiology was discussed. Possibility of remaining with an ostomy permanently was discussed. I offered ostomy takedown. Laparoscopic & open techniques were discussed.  Risks such as bleeding, infection, abscess, leak, reoperation, possible re-ostomy, injury to other organs, hernia, heart attack, death, and other risks were discussed. I noted a good likelihood this will help address the problem. Goals of post-operative recovery were discussed as well. We will work to minimize complications. Questions were answered. The patient expresses understanding & wishes to proceed with surgery.  Pt Education - [facility] Abdominal Surgery ([place]): discussed with patient and provided information.

## 2015-06-02 NOTE — Progress Notes (Signed)
Nurse informed Dr. Brantley Stage that patient completed bowel prep yesterday as ordered, however patient ate some shrimp last night after having completed prep. Dr. Brantley Stage stated he would go see patient. No need orders at this time.

## 2015-06-02 NOTE — Anesthesia Postprocedure Evaluation (Signed)
Anesthesia Post Note  Patient: Alexa Key  Procedure(s) Performed: Procedure(s) (LRB): COLOSTOMY CLOSURE (N/A) PARTIAL COLECTOMY (N/A)  Patient location during evaluation: PACU Anesthesia Type: General Level of consciousness: awake and alert Pain management: pain level controlled Vital Signs Assessment: post-procedure vital signs reviewed and stable Respiratory status: spontaneous breathing, nonlabored ventilation, respiratory function stable and patient connected to nasal cannula oxygen Cardiovascular status: blood pressure returned to baseline and stable Postop Assessment: no signs of nausea or vomiting Anesthetic complications: no    Last Vitals:  Filed Vitals:   06/02/15 1400 06/02/15 1415  BP: 122/57 153/64  Pulse: 68 74  Temp:    Resp: 16 18    Last Pain:  Filed Vitals:   06/02/15 1423  PainSc: 0-No pain                 Amyia Lodwick,JAMES TERRILL

## 2015-06-03 ENCOUNTER — Encounter (HOSPITAL_COMMUNITY): Payer: Self-pay | Admitting: Surgery

## 2015-06-03 LAB — COMPREHENSIVE METABOLIC PANEL
ALBUMIN: 2.4 g/dL — AB (ref 3.5–5.0)
ALT: 9 U/L — ABNORMAL LOW (ref 14–54)
ANION GAP: 8 (ref 5–15)
AST: 21 U/L (ref 15–41)
Alkaline Phosphatase: 62 U/L (ref 38–126)
BILIRUBIN TOTAL: 0.4 mg/dL (ref 0.3–1.2)
BUN: 18 mg/dL (ref 6–20)
CO2: 20 mmol/L — AB (ref 22–32)
Calcium: 7.2 mg/dL — ABNORMAL LOW (ref 8.9–10.3)
Chloride: 114 mmol/L — ABNORMAL HIGH (ref 101–111)
Creatinine, Ser: 1.53 mg/dL — ABNORMAL HIGH (ref 0.44–1.00)
GFR calc Af Amer: 40 mL/min — ABNORMAL LOW (ref >60)
GFR calc non Af Amer: 34 mL/min — ABNORMAL LOW (ref >60)
GLUCOSE: 127 mg/dL — AB (ref 65–99)
POTASSIUM: 4.9 mmol/L (ref 3.5–5.1)
SODIUM: 142 mmol/L (ref 135–145)
TOTAL PROTEIN: 4.7 g/dL — AB (ref 6.5–8.1)

## 2015-06-03 LAB — CBC
HEMATOCRIT: 31.9 % — AB (ref 36.0–46.0)
HEMOGLOBIN: 10.4 g/dL — AB (ref 12.0–15.0)
MCH: 32 pg (ref 26.0–34.0)
MCHC: 32.6 g/dL (ref 30.0–36.0)
MCV: 98.2 fL (ref 78.0–100.0)
Platelets: 153 10*3/uL (ref 150–400)
RBC: 3.25 MIL/uL — ABNORMAL LOW (ref 3.87–5.11)
RDW: 14 % (ref 11.5–15.5)
WBC: 17.6 10*3/uL — AB (ref 4.0–10.5)

## 2015-06-03 LAB — HEMOGLOBIN A1C
HEMOGLOBIN A1C: 5.8 % — AB (ref 4.8–5.6)
MEAN PLASMA GLUCOSE: 120 mg/dL

## 2015-06-03 MED ORDER — SODIUM CHLORIDE 0.45 % IV BOLUS
500.0000 mL | Freq: Once | INTRAVENOUS | Status: AC
  Administered 2015-06-03: 500 mL via INTRAVENOUS

## 2015-06-03 NOTE — Consult Note (Addendum)
WOC wound consult note Reason for Consult: Consult requested for Vac application to 2 abd wounds.   Pt familiar to [facility] team from previous admission. Wound type: Full thickness post-op wounds to left abd and midline abd Measurement: Left abd previous colostomy site; 2X4X2.5cm with undermining to .5cm, wound beefy red, scant amt pink drainage, no odor Midline abd beefy red; 2 open wounds separated by sutures near the middle; 2X4X4.5 and 18X4X5cm, both beefy red with small amt red drainage, no odor.   Dressing procedure/placement/frequency: Applied one piece of black foam to each wound bed and bridged all locations together to 136mm cont suction.  Pt was medicated for pain prior to procedure and tolerated with mod amt pain.  Plan for dressing change on Fri. Julien Girt MSN, RN, [place], [place], [place]

## 2015-06-03 NOTE — Progress Notes (Signed)
1 Day Post-Op  Subjective: PT SORE  Objective: Vital signs in last 24 hours: Temp:  [97.5 F (36.4 C)-98.5 F (36.9 C)] 98.1 F (36.7 C) (02/01 0420) Pulse Rate:  [61-80] 65 (02/01 0550) Resp:  [15-22] 21 (02/01 0550) BP: (118-160)/(51-76) 136/63 mmHg (02/01 0550) SpO2:  [93 %-98 %] 95 % (02/01 0550) Arterial Line BP: (119-169)/(33-80) 119/63 mmHg (01/31 2350) Weight:  [59.1 kg (130 lb 4.7 oz)-60.7 kg (133 lb 13.1 oz)] 59.1 kg (130 lb 4.7 oz) (02/01 0437) Last BM Date: 06/02/15  Intake/Output from previous day: 01/31 0701 - 02/01 0700 In: 2741.7 [I.V.:2611.7] Out: 860 [Urine:460; Emesis/NG output:250; Blood:150] Intake/Output this shift:    Resp: clear to auscultation bilaterally Cardio: regular rate and rhythm, S1, S2 normal, no murmur, click, rub or gallop Incision/Wound:OPEN  WITH KERLEX PACKING  SOFT SORE ABDOMEN  NG 250 last night  No BM or flatus yet   Lab Results:   Recent Labs  06/02/15 1632 06/03/15 0415  WBC 20.7* 17.6*  HGB 11.4* 10.4*  HCT 36.6 31.9*  PLT 210 153   BMET  Recent Labs  06/02/15 1632 06/03/15 0415  NA  --  142  K  --  4.9  CL  --  114*  CO2  --  20*  GLUCOSE  --  127*  BUN  --  18  CREATININE 1.62* 1.53*  CALCIUM  --  7.2*   PT/INR No results for input(s): LABPROT, INR in the last 72 hours. ABG  Recent Labs  06/02/15 1315  PHART 7.243*  HCO3 16.8*    Studies/Results: No results found.  Anti-infectives: Anti-infectives    Start     Dose/Rate Route Frequency Ordered Stop   06/02/15 0712  clindamycin (CLEOCIN) 900 MG/50ML IVPB    Comments:  Ara Kussmaul   : cabinet override      06/02/15 [id number] 06/02/15 0747   06/02/15 0700  clindamycin (CLEOCIN) IVPB 900 mg     900 mg 100 mL/hr over 30 Minutes Intravenous 60 min pre-op 06/02/15 0700     06/02/15 0700  gentamicin (GARAMYCIN) 300 mg in dextrose 5 % 100 mL IVPB     5 mg/kg  60.8 kg 107.5 mL/hr over 60 Minutes Intravenous 60 min pre-op 06/02/15 0700 06/02/15 0841       Assessment/Plan: s/p Procedure(s): COLOSTOMY CLOSURE (N/A) PARTIAL COLECTOMY (N/A) Patient Active Problem List   Diagnosis Date Noted  . Colostomy in place [facility]) 06/02/2015  . S/P colostomy ([place]) 06/02/2015  . Subclavian steal syndrome   . Acute on chronic diastolic heart failure ([place])   . Ileus ([place])   . Hypoxia   . 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 Abdominal pain 09/29/2014  . CKD (chronic kidney disease) stage 3, GFR 30-59 ml/min 09/29/2014  . GERD (gastroesophageal reflux disease) 09/29/2014  . Abnormal cardiac function test 09/22/2014  . CAD S/P remote PCI (no details) 09/22/2014  . PVD- remote Lt SCA PTA (Dr Kellie Simmering) 09/22/2014  . COPD (chronic obstructive pulmonary disease) ([place]) 08/11/2014  . Essential hypertension 10/16/2013  . Cervical radiculopathy at C7 06/04/2013  . Dyslipidemia 12/13/2012  . Colovaginal fistula s/p colectomy/colostomy/repair 10/02/2014 11/15/2012  . Tobacco user 09/25/2012  . Kidney stone 09/25/2012  . NSTEMI (non-ST elevated myocardial infarction) ([place]) 08/07/2012     OOB Bolus for UOP Watch labs COPD  Seems stable  Keep in step down for today NPO until bowel function returns Wound vac/ dressing changes   LOS: 1 day  Keiondre Colee A. 06/03/2015

## 2015-06-04 LAB — COMPREHENSIVE METABOLIC PANEL
ALBUMIN: 2.4 g/dL — AB (ref 3.5–5.0)
ALT: 11 U/L — ABNORMAL LOW (ref 14–54)
AST: 19 U/L (ref 15–41)
Alkaline Phosphatase: 71 U/L (ref 38–126)
Anion gap: 9 (ref 5–15)
BUN: 14 mg/dL (ref 6–20)
CHLORIDE: 116 mmol/L — AB (ref 101–111)
CO2: 21 mmol/L — ABNORMAL LOW (ref 22–32)
Calcium: 8.1 mg/dL — ABNORMAL LOW (ref 8.9–10.3)
Creatinine, Ser: 1.36 mg/dL — ABNORMAL HIGH (ref 0.44–1.00)
GFR calc Af Amer: 46 mL/min — ABNORMAL LOW (ref >60)
GFR calc non Af Amer: 40 mL/min — ABNORMAL LOW (ref >60)
GLUCOSE: 137 mg/dL — AB (ref 65–99)
POTASSIUM: 5.7 mmol/L — AB (ref 3.5–5.1)
Sodium: 146 mmol/L — ABNORMAL HIGH (ref 135–145)
Total Bilirubin: 0.4 mg/dL (ref 0.3–1.2)
Total Protein: 5.4 g/dL — ABNORMAL LOW (ref 6.5–8.1)

## 2015-06-04 LAB — CBC
HCT: 33.9 % — ABNORMAL LOW (ref 36.0–46.0)
Hemoglobin: 10.5 g/dL — ABNORMAL LOW (ref 12.0–15.0)
MCH: 31.5 pg (ref 26.0–34.0)
MCHC: 31 g/dL (ref 30.0–36.0)
MCV: 101.8 fL — ABNORMAL HIGH (ref 78.0–100.0)
PLATELETS: 167 10*3/uL (ref 150–400)
RBC: 3.33 MIL/uL — ABNORMAL LOW (ref 3.87–5.11)
RDW: 14.7 % (ref 11.5–15.5)
WBC: 17.3 10*3/uL — AB (ref 4.0–10.5)

## 2015-06-04 MED ORDER — DEXTROSE-NACL 5-0.45 % IV SOLN
INTRAVENOUS | Status: DC
  Administered 2015-06-04 – 2015-06-09 (×10): via INTRAVENOUS
  Filled 2015-06-04 (×2): qty 1000

## 2015-06-04 MED ORDER — FUROSEMIDE 10 MG/ML IJ SOLN
20.0000 mg | Freq: Once | INTRAMUSCULAR | Status: AC
  Administered 2015-06-04: 20 mg via INTRAVENOUS
  Filled 2015-06-04: qty 2

## 2015-06-04 NOTE — Progress Notes (Signed)
Report called pt transferring to [id number] via w/c with belongings.

## 2015-06-04 NOTE — Care Management (Addendum)
Application for [facility] through [place] faxed to [facility] at Dr Cornett's office for signature . Copy left on shadow chart also. Magdalen Spatz RN BSN     Have done a benefits check with [employer]  , for home VAC in case needed . [employer] is not in network with patient's insurance . Patient's insurance has contract with [facility] through [place] . Application for the [place] system is on shadow chart .  If needed MD please sign and call case manager . Magdalen Spatz RN [phone]

## 2015-06-04 NOTE — Progress Notes (Signed)
2 Days Post-Op  Subjective: PT HAVING BM  250 OUT NGT  FOR 24 HOURS DOES NOT LIKE VAC   Objective: Vital signs in last 24 hours: Temp:  [97.2 F (36.2 C)-99 F (37.2 C)] 98.2 F (36.8 C) (02/02 0336) Pulse Rate:  [58-74] 74 (02/02 0336) Resp:  [15-27] 21 (02/02 0336) BP: (105-165)/(43-65) 159/59 mmHg (02/02 0336) SpO2:  [90 %-98 %] 94 % (02/02 0336) Last BM Date: 06/02/15  Intake/Output from previous day: 02/01 0701 - 02/02 0700 In: 2460 [I.V.:2400; NG/GT:60] Out: 1300 [Urine:1000; Emesis/NG output:300] Intake/Output this shift:    Cardio: regular rate and rhythm, S1, S2 normal, no murmur, click, rub or gallop  Abdomen  Vac in place nd  Pulm:   No wheezing  Lab Results:   Recent Labs  06/03/15 0415 06/04/15 0424  WBC 17.6* 17.3*  HGB 10.4* 10.5*  HCT 31.9* 33.9*  PLT 153 167   BMET  Recent Labs  06/03/15 0415 06/04/15 0424  NA 142 146*  K 4.9 5.7*  CL 114* 116*  CO2 20* 21*  GLUCOSE 127* 137*  BUN 18 14  CREATININE 1.53* 1.36*  CALCIUM 7.2* 8.1*   PT/INR No results for input(s): LABPROT, INR in the last 72 hours. ABG  Recent Labs  06/02/15 1315  PHART 7.243*  HCO3 16.8*    Studies/Results: No results found.  Anti-infectives: Anti-infectives    Start     Dose/Rate Route Frequency Ordered Stop   06/02/15 [id number]  clindamycin (CLEOCIN) 900 MG/50ML IVPB    Comments:  Ara Kussmaul   : cabinet override      06/02/15 [id number] 06/02/15 0747   06/02/15 0700  clindamycin (CLEOCIN) IVPB 900 mg     900 mg 100 mL/hr over 30 Minutes Intravenous 60 min pre-op 06/02/15 0700     06/02/15 0700  gentamicin (GARAMYCIN) 300 mg in dextrose 5 % 100 mL IVPB     5 mg/kg  60.8 kg 107.5 mL/hr over 60 Minutes Intravenous 60 min pre-op 06/02/15 0700 06/02/15 0841      Assessment/Plan: s/p Procedure(s): COLOSTOMY CLOSURE (N/A) PARTIAL COLECTOMY (N/A) Hyperkalemia  Change IVF and give one dose of lasix 20 mg IV Transfer to floor Wound vac DVT  prevention OOB PT/OT to see  DC NGT  Leukocytosis   Follow for now  Patient Active Problem List   Diagnosis Date Noted  . Colostomy in place [facility]) 06/02/2015  . S/P colostomy ([place]) 06/02/2015  . Subclavian steal syndrome   . Acute on chronic diastolic heart failure ([place])   . Ileus ([place])   . Hypoxia   . 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 Abdominal pain 09/29/2014  . CKD (chronic kidney disease) stage 3, GFR 30-59 ml/min 09/29/2014  . GERD (gastroesophageal reflux disease) 09/29/2014  . Abnormal cardiac function test 09/22/2014  . CAD S/P remote PCI (no details) 09/22/2014  . PVD- remote Lt SCA PTA (Dr Kellie Simmering) 09/22/2014  . COPD (chronic obstructive pulmonary disease) ([place]) 08/11/2014  . Essential hypertension 10/16/2013  . Cervical radiculopathy at C7 06/04/2013  . Dyslipidemia 12/13/2012  . Colovaginal fistula s/p colectomy/colostomy/repair 10/02/2014 11/15/2012  . Tobacco user 09/25/2012  . Kidney stone 09/25/2012  . NSTEMI (non-ST elevated myocardial infarction) ([place]) 08/07/2012    LOS: 2 days    Natahlia Hoggard A. 06/04/2015

## 2015-06-04 NOTE — Evaluation (Signed)
Occupational Therapy Evaluation Patient Details Name: Alexa Key MRN: [id number] DOB: 1950/06/02 Today's Date: 06/04/2015    History of Present Illness Pt is a 65 yo female admitted for colostomy closer adn partial colectomy.  Pt now with a wound vac.   Clinical Impression   Pt admitted with the above diagnosis and has the deficits listed below. Pt would benefit from cont OT to address decreased independence with LE dressing, functional adl transfers and with toileting/transfers.  Pt has a grandson that helps care for her at home that is unable to provide 24 hour assist in her apartment so feel pt may need to go to SNF before returning home.    Follow Up Recommendations  SNF;Supervision/Assistance - 24 hour    Equipment Recommendations  None recommended by OT    Recommendations for Other Services       Precautions / Restrictions Precautions Precautions: Fall Restrictions Weight Bearing Restrictions: No      Mobility Bed Mobility Overal bed mobility: Needs Assistance Bed Mobility: Supine to Sit     Supine to sit: HOB elevated;Min assist     General bed mobility comments: Pt did well moving in bed although HOB was up to reduce pain in abdomen since this was first time up.  Transfers Overall transfer level: Needs assistance Equipment used: 1 person hand held assist Transfers: Sit to/from [facility] Sit to Stand: Min assist Stand pivot transfers: Min assist       General transfer comment: Cues for hand placement.    Balance Overall balance assessment: Needs assistance Sitting-balance support: No upper extremity supported;Feet supported Sitting balance-Leahy Scale: Good     Standing balance support: Bilateral upper extremity supported;During functional activity Standing balance-Leahy Scale: Poor Standing balance comment: First time up since surgery, pt did need outside support to remain standing.  Pt also feeling dizzy and nauseous.                            ADL Overall ADL's : Needs assistance/impaired Eating/Feeding: Independent;Sitting   Grooming: Wash/dry hands;Wash/dry face;Standing;Min guard Grooming Details (indicate cue type and reason): pt fatigues quickly. Upper Body Bathing: Set up;Sitting   Lower Body Bathing: Moderate assistance;Sit to/from stand Lower Body Bathing Details (indicate cue type and reason): Pt has difficulty reaching below knees or bending legs up bc of abdominal soreness.  pt would benefit from adaptive equipment. Upper Body Dressing : Minimal assistance;Sitting Upper Body Dressing Details (indicate cue type and reason): min assist to get shirt over head bc it pulls on abdomen to reach overhead. Lower Body Dressing: Maximal assistance;Sit to/from stand Lower Body Dressing Details (indicate cue type and reason): assist getting garments started over legs and total assist for socks and shoes at this point.  Pt would have increased independence with adaptive equipment. Toilet Transfer: Minimal Production assistant, radio Details (indicate cue type and reason): hand held assist.  First time up since surgery therefore min assist.   Toileting- Clothing Manipulation and Hygiene: Minimal assistance;Sit to/from stand       Functional mobility during ADLs: Minimal assistance General ADL Comments: Pt limited most with LE adls due to abdominal pain.     Vision Vision Assessment?: No apparent visual deficits   Perception     Praxis      Pertinent Vitals/Pain Pain Assessment: 0-10 Pain Score: 8  Pain Location: abdomen Pain Descriptors / Indicators: Guarding;Grimacing;Operative site guarding Pain Intervention(s): Limited activity within patient's tolerance;Premedicated before session;Repositioned;Monitored during  session     Hand Dominance Right   Extremity/Trunk Assessment Upper Extremity Assessment Upper Extremity Assessment: Overall WFL for tasks assessed   Lower  Extremity Assessment Lower Extremity Assessment: Defer to PT evaluation   Cervical / Trunk Assessment Cervical / Trunk Assessment: Normal   Communication Communication Communication: No difficulties   Cognition Arousal/Alertness: Awake/alert Behavior During Therapy: WFL for tasks assessed/performed Overall Cognitive Status: Within Functional Limits for tasks assessed                     General Comments       Exercises       Shoulder Instructions      Home Living Family/patient expects to be discharged to:: Private residence Living Arrangements: Other relatives Available Help at Discharge: Family;Available PRN/intermittently Type of Home: Apartment Home Access: Level entry     Home Layout: One level     Bathroom Shower/Tub: Tub/shower unit;Curtain Shower/tub characteristics: Architectural technologist: Standard     Home Equipment: Bedside commode;Shower seat   Additional Comments: pt lives with 26 year old grandson who cannot be there 24/7.      Prior Functioning/Environment Level of Independence: Independent        Comments: Daughter concerned about pt being home alone. "She was wobbly on her feet before surgery and I travel for work."    OT Diagnosis: Generalized weakness;Acute pain   OT Problem List: Decreased strength;Decreased activity tolerance;Impaired balance (sitting and/or standing);Decreased knowledge of use of DME or AE;Pain   OT Treatment/Interventions: Self-care/ADL training;DME and/or AE instruction;Therapeutic activities    OT Goals(Current goals can be found in the care plan section) Acute Rehab OT Goals Patient Stated Goal: to not go to a SNF but go home but not sure I can do that. OT Goal Formulation: With patient Time For Goal Achievement: 06/18/15 Potential to Achieve Goals: Good ADL Goals Pt Will Perform Grooming: with supervision;standing Pt Will Perform Lower Body Bathing: with supervision;with adaptive equipment;sit to/from  stand Pt Will Perform Lower Body Dressing: with supervision;with adaptive equipment;sit to/from stand Additional ADL Goal #1: Pt will walk to bathroom and toilet on high comode with S.  OT Frequency: Min 2X/week   Barriers to D/C: Decreased caregiver support  grandson not available 24/7..not even for first 1-2 days home.       Co-evaluation              End of Session Nurse Communication: Mobility status  Activity Tolerance: Patient tolerated treatment well;Other (comment) (pt transferred to new room so eval was cut short.) Patient left: in chair;Other (comment);with nursing/sitter in room (left in w/c with nurse tech transporting pt to new floor.)   Time: [id number] OT Time Calculation (min): 21 min Charges:  OT General Charges $OT Visit: 1 Procedure OT Evaluation $OT Eval Moderate Complexity: 1 Procedure G-Codes:    Glenford Peers 2015/06/27, 11:15 AM  [phone]

## 2015-06-04 NOTE — Care Management Note (Addendum)
Case Management Note  Patient Details  Name: Alexa Key MRN: [id number] Date of Birth: 1950-08-25  Subjective/Objective:     Date: 06/04/15 Spoke with patient at the bedside.  Introduced self as Tourist information centre manager and explained role in discharge planning and how to be reached.  Verified patient lives in [place] with 67 year old grandson, whom she states will not be able to assist her with anything at home, patient has a wound vac in place , she states she has worked with [facility] before also she states she has been to [facility] before and if she needs to go to a snf she would prefer [place].  Expressed potential need for no other DME.  Patient denied needing help with their medication.  Verified patient has PCP [name].  NCM left message for Dr. Irven Baltimore RN to call regarding wound vac. Await pt eval.  Plan: CM will continue to follow for discharge planning and [facility] resources.                Action/Plan: S/p colostomy closure and partial colectomy  Expected Discharge Date:                  Expected Discharge Plan:  Skilled Nursing Facility  In-House Referral:  Clinical Social Work  Discharge planning Services  CM Consult  Post Acute Care Choice:    Choice offered to:     DME Arranged:    DME Agency:     HH Arranged:    [place] Agency:     Status of Service:  In process, will continue to follow  Medicare Important Message Given:    Date Medicare IM Given:    Medicare IM give by:    Date Additional Medicare IM Given:    Additional Medicare Important Message give by:     If discussed at [place] of Stay Meetings, dates discussed:    Additional Comments:  Zenon Mayo, RN 06/04/2015, 9:56 AM

## 2015-06-04 NOTE — Evaluation (Signed)
Physical Therapy Evaluation Patient Details Name: Alexa Key MRN: [id number] DOB: 1951-04-19 Today's Date: 06/04/2015   History of Present Illness  Pt is a 65 y/o F s/p colostomy closure and partial colectomy.  Pt's PMH includes MI, PVD, CVA, partial lobectomy of lung, fibromyalgia, anxiety, subclavian steal syndrome, DM.  Clinical Impression  Patient is s/p above surgery resulting in functional limitations due to the deficits listed below (see PT Problem List). Alexa Key refuses to ambulate today and is limited by nausea and pain.  Attempted to educate pt in mobility techniques to decrease pain but pt w/ difficulty demonstrating proper log roll. Recommending ST SNF at d/c as pt is alone for most of the day and currently requires min assist for sit<>stand and stand pivot transfers. Patient will benefit from skilled PT to increase their independence and safety with mobility to allow discharge to the venue listed below.      Follow Up Recommendations SNF;Supervision for mobility/OOB    Equipment Recommendations  Other (comment) (TBD at next venue of care)    Recommendations for Other Services       Precautions / Restrictions Precautions Precautions: Fall Precaution Comments: wound vac Restrictions Weight Bearing Restrictions: No      Mobility  Bed Mobility Overal bed mobility: Needs Assistance Bed Mobility: Sidelying to Sit;Rolling;Sit to Sidelying Rolling: Min assist Sidelying to sit: Min assist     Sit to sidelying: Mod assist General bed mobility comments: Pt w/ poor demonstration of log roll despite verbal cues, demonstration, and physical assist.  Transfers Overall transfer level: Needs assistance Equipment used: 1 person hand held assist Transfers: Sit to/from [facility] Sit to Stand: Min assist Stand pivot transfers: Min assist       General transfer comment: Pt slow to stand x2 from bed and requires min assist for stability standing at bedside.   Pt holding onto [facility] and bed rail during stand pivot as pt became incontinent of urine, min assist to steady.  Ambulation/Gait             General Gait Details: refused to ambulate   Stairs            Wheelchair Mobility    Modified Rankin (Stroke Patients Only)       Balance Overall balance assessment: Needs assistance Sitting-balance support: Bilateral upper extremity supported;Feet supported Sitting balance-Leahy Scale: Fair     Standing balance support: Single extremity supported;During functional activity Standing balance-Leahy Scale: Poor Standing balance comment: HHA to steady                             Pertinent Vitals/Pain Pain Assessment: Faces Faces Pain Scale: Hurts whole lot Pain Location: abdomen Pain Descriptors / Indicators: Nagging;Constant Pain Intervention(s): Limited activity within patient's tolerance;Monitored during session;Repositioned    Home Living Family/patient expects to be discharged to:: Private residence Living Arrangements: Other relatives Available Help at Discharge: Family;Available PRN/intermittently Type of Home: Apartment Home Access: Level entry     Home Layout: One level Home Equipment: Bedside commode;Shower seat Additional Comments: pt lives with 30 year old grandson who cannot be there 24/7.    Prior Function Level of Independence: Independent               Hand Dominance   Dominant Hand: Right    Extremity/Trunk Assessment   Upper Extremity Assessment: Defer to OT evaluation           Lower Extremity Assessment:  RLE deficits/detail;LLE deficits/detail RLE Deficits / Details: strength grossly 4/5 LLE Deficits / Details: strength grossly 4/5     Communication   Communication: No difficulties  Cognition Arousal/Alertness: Awake/alert Behavior During Therapy: Agitated;Flat affect Overall Cognitive Status: Within Functional Limits for tasks assessed                       General Comments General comments (skin integrity, edema, etc.): Pt dry heaving x2 sitting EOB, otherwise making noise that sounds like mimicing of dry heaving.    Exercises        Assessment/Plan    PT Assessment Patient needs continued PT services  PT Diagnosis Difficulty walking;Generalized weakness;Acute pain   PT Problem List Decreased strength;Decreased activity tolerance;Decreased balance;Decreased mobility;Decreased knowledge of use of DME;Decreased safety awareness;Pain  PT Treatment Interventions DME instruction;Gait training;Functional mobility training;Therapeutic activities;Therapeutic exercise;Balance training;Patient/family education   PT Goals (Current goals can be found in the Care Plan section) Acute Rehab PT Goals Patient Stated Goal: to decrease pain and nausea PT Goal Formulation: With patient/family Time For Goal Achievement: 06/18/15 Potential to Achieve Goals: Fair    Frequency Min 2X/week   Barriers to discharge Decreased caregiver support Alone for most of day    Co-evaluation               End of Session Equipment Utilized During Treatment: Oxygen Activity Tolerance: Other (comment);Patient limited by fatigue;Patient limited by pain (nausea) Patient left: in bed;with call bell/phone within reach;with bed alarm set;with family/visitor present Nurse Communication: Mobility status;Other (comment) (pt's nausea)         Time: [id number] PT Time Calculation (min) (ACUTE ONLY): 18 min   Charges:   PT Evaluation $PT Eval Moderate Complexity: 1 Procedure     PT G Codes:       Joslyn Hy PT, DPT [phone] Pager: [phone] 06/04/2015, 4:21 PM

## 2015-06-05 LAB — CBC
HCT: 31.9 % — ABNORMAL LOW (ref 36.0–46.0)
Hemoglobin: 10 g/dL — ABNORMAL LOW (ref 12.0–15.0)
MCH: 31.3 pg (ref 26.0–34.0)
MCHC: 31.3 g/dL (ref 30.0–36.0)
MCV: 99.7 fL (ref 78.0–100.0)
PLATELETS: 169 10*3/uL (ref 150–400)
RBC: 3.2 MIL/uL — AB (ref 3.87–5.11)
RDW: 14 % (ref 11.5–15.5)
WBC: 15.8 10*3/uL — ABNORMAL HIGH (ref 4.0–10.5)

## 2015-06-05 LAB — COMPREHENSIVE METABOLIC PANEL
ALK PHOS: 62 U/L (ref 38–126)
ALT: 11 U/L — AB (ref 14–54)
AST: 14 U/L — ABNORMAL LOW (ref 15–41)
Albumin: 2.3 g/dL — ABNORMAL LOW (ref 3.5–5.0)
Anion gap: 6 (ref 5–15)
BUN: 9 mg/dL (ref 6–20)
CALCIUM: 8.6 mg/dL — AB (ref 8.9–10.3)
CHLORIDE: 112 mmol/L — AB (ref 101–111)
CO2: 25 mmol/L (ref 22–32)
CREATININE: 1.23 mg/dL — AB (ref 0.44–1.00)
GFR, EST AFRICAN AMERICAN: 52 mL/min — AB (ref >60)
GFR, EST NON AFRICAN AMERICAN: 45 mL/min — AB (ref >60)
Glucose, Bld: 119 mg/dL — ABNORMAL HIGH (ref 65–99)
Potassium: 4.6 mmol/L (ref 3.5–5.1)
Sodium: 143 mmol/L (ref 135–145)
Total Bilirubin: 0.5 mg/dL (ref 0.3–1.2)
Total Protein: 5.5 g/dL — ABNORMAL LOW (ref 6.5–8.1)

## 2015-06-05 MED ORDER — HYDROMORPHONE HCL 1 MG/ML IJ SOLN
1.0000 mg | INTRAMUSCULAR | Status: DC | PRN
  Administered 2015-06-05 – 2015-06-10 (×10): 1 mg via INTRAVENOUS
  Filled 2015-06-05 (×10): qty 1

## 2015-06-05 MED ORDER — OXYCODONE-ACETAMINOPHEN 7.5-325 MG PO TABS
2.0000 | ORAL_TABLET | ORAL | Status: DC | PRN
  Administered 2015-06-05 – 2015-06-06 (×3): 2 via ORAL
  Filled 2015-06-05 (×3): qty 2

## 2015-06-05 NOTE — Consult Note (Signed)
WOC wound follow up Wound type: surgical  Measurement: see note from this week 06/03/15 Wound bed: subcutaneous tissue, clean, moist Drainage (amount, consistency, odor) minimal in VAC canister Periwound: intact  Dressing procedure/placement/frequency: Protected skin between takedown site and between open abdominal wounds with VAC drape.  1pc of black foam used to fill proximal end of midline surgical wound, 1pc of black foam used to fill distal end of open midline surgical wound, 1pc of black foam used to fill take down site.  1pc of black foam used as bridge for all sites.  Patient tolerated well, however she is quite anxious and required someone to hold her hand during dressing. Dry heaving at time.  Pain meds requested PO after dressing intact. Seal at 148mmHG.  [place] team will follow along with you for complex VAC dressing changes M/W/F Para [place], [place], [name]

## 2015-06-05 NOTE — Progress Notes (Signed)
Patient and daughter requested that a "grown man come and stand her up".  RN and CNA educated the patient and daughter that physical therapy works on ambulating bedridden patients, and per physical therapy, we sit up and then dangle before we stand up, one step at a time.  When RN raised the head of the bed up to a more sitting position and asked patient to sit up and straighten her body out of a fetal position, patient stated she could not do that.

## 2015-06-05 NOTE — NC FL2 (Signed)
[place] MEDICAID FL2 LEVEL OF CARE SCREENING TOOL     IDENTIFICATION  Patient Name: Alexa Key Birthdate: 03/25/1951 Sex: female Admission Date (Current Location): 06/02/2015  [facility] and [place] Number:  Herbalist and Address:  [facility], [place] [address], [place], [place] [address]      Provider Number: [id number]  Attending Physician Name and Address:  Erroll Luna, MD  Relative Name and Phone Number:       Current Level of Care: Hospital Recommended Level of Care: [place] Prior Approval Number:    Date Approved/Denied:   PASRR Number:  [id number]  Discharge Plan: SNF    Current Diagnoses: Patient Active Problem List   Diagnosis Date Noted  . Colostomy in place [facility]) 06/02/2015  . S/P colostomy ([place]) 06/02/2015  . Subclavian steal syndrome   . Acute on chronic diastolic heart failure ([place])   . Ileus ([place])   . Hypoxia   . 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 Abdominal pain 09/29/2014  . CKD (chronic kidney disease) stage 3, GFR 30-59 ml/min 09/29/2014  . GERD (gastroesophageal reflux disease) 09/29/2014  . Abnormal cardiac function test 09/22/2014  . CAD S/P remote PCI (no details) 09/22/2014  . PVD- remote Lt [facility] PTA (Dr Kellie Simmering) 09/22/2014  . COPD (chronic obstructive pulmonary disease) ([place]) 08/11/2014  . Essential hypertension 10/16/2013  . Cervical radiculopathy at C7 06/04/2013  . Dyslipidemia 12/13/2012  . Colovaginal fistula s/p colectomy/colostomy/repair 10/02/2014 11/15/2012  . Tobacco user 09/25/2012  . Kidney stone 09/25/2012  . NSTEMI (non-ST elevated myocardial infarction) ([place]) 08/07/2012    Orientation RESPIRATION BLADDER Height & Weight     Self, Time, Situation, Place  O2 (2 Liters) Incontinent Weight: 143 lb 1.3 oz (64.9 kg) Height:  5\' 1"  (154.9 cm)  BEHAVIORAL SYMPTOMS/MOOD NEUROLOGICAL BOWEL NUTRITION STATUS      Incontinent  Diet (clear liquid )  AMBULATORY STATUS COMMUNICATION OF NEEDS Skin   Limited Assist Verbally Other (Comment) (incisions )                       Personal Care Assistance Level of Assistance  Bathing, Feeding, Dressing Bathing Assistance: Limited assistance Feeding assistance: Independent Dressing Assistance: Limited assistance     Functional Limitations Info  Sight, Speech, Hearing Sight Info: Adequate Hearing Info: Adequate Speech Info: Adequate    SPECIAL CARE FACTORS FREQUENCY  PT (By licensed PT), OT (By licensed OT)                    Contractures      Additional Factors Info  Code Status, Allergies Code Status Info: FULL Allergies Info: Penicillins, Sulfa Antibiotics           Current Medications (06/05/2015):  This is the current hospital active medication list Current Facility-Administered Medications  Medication Dose Route Frequency Provider Last Rate Last Dose  . acetaminophen (TYLENOL) tablet 650 mg  650 mg Oral Q6H PRN Erroll Luna, MD      . albuterol (PROVENTIL) (2.5 MG/3ML) 0.083% nebulizer solution 3 mL  3 mL Inhalation Q6H PRN Erroll Luna, MD      . alum & mag hydroxide-simeth (MAALOX/MYLANTA) 200-200-20 MG/5ML suspension 30 mL  30 mL Oral Q6H PRN Erroll Luna, MD      . amLODipine (NORVASC) tablet 10 mg  10 mg Oral Daily Erroll Luna, MD   10 mg at 06/05/15 0953  . antiseptic oral rinse (CPC /  CETYLPYRIDINIUM CHLORIDE 0.05%) solution 7 mL  7 mL Mouth Rinse BID Erroll Luna, MD   7 mL at 06/05/15 0954  . budesonide-formoterol (SYMBICORT) 160-4.5 MCG/ACT inhaler 2 puff  2 puff Inhalation BID Erroll Luna, MD   2 puff at 06/04/15 [phone]  . clindamycin (CLEOCIN) IVPB 900 mg  900 mg Intravenous 60 min Pre-Op Erroll Luna, MD   900 mg at 06/03/15 1051  . dextrose 5 %-0.45 % sodium chloride infusion   Intravenous Continuous Erroll Luna, MD 75 mL/hr at 06/05/15 0954    . enoxaparin (LOVENOX) injection 40 mg  40 mg Subcutaneous Q24H  Erroll Luna, MD   40 mg at 06/05/15 0953  . HYDROmorphone (DILAUDID) injection 1 mg  1 mg Intravenous Q2H PRN Erroll Luna, MD   1 mg at 06/05/15 1054  . isosorbide mononitrate (IMDUR) 24 hr tablet 30 mg  30 mg Oral Daily Erroll Luna, MD   30 mg at 06/05/15 0954  . metoprolol (LOPRESSOR) injection 5 mg  5 mg Intravenous 4 times per day Erroll Luna, MD   5 mg at 06/05/15 1054  . ondansetron (ZOFRAN) tablet 4 mg  4 mg Oral Q6H PRN Erroll Luna, MD       Or  . ondansetron [facility]) injection 4 mg  4 mg Intravenous Q6H PRN Erroll Luna, MD   4 mg at 06/03/15 2359  . oxyCODONE-acetaminophen (PERCOCET) 7.5-325 MG per tablet 2 tablet  2 tablet Oral Q4H PRN Erroll Luna, MD      . pravastatin (PRAVACHOL) tablet 80 mg  80 mg Oral QHS Erroll Luna, MD   80 mg at 06/04/15 2114  . saccharomyces boulardii (FLORASTOR) capsule 250 mg  250 mg Oral BID Erroll Luna, MD   250 mg at 06/05/15 0954  . tiotropium (SPIRIVA) inhalation capsule 18 mcg  18 mcg Inhalation Daily Erroll Luna, MD   18 mcg at 06/04/15 [id number]  . zolpidem (AMBIEN) tablet 5 mg  5 mg Oral QHS PRN Erroll Luna, MD   5 mg at 06/04/15 2114     Discharge Medications: Please see discharge summary for a list of discharge medications.  Relevant Imaging Results:  Relevant Lab Results:   Additional Information SS#: [id number]  Raymondo Band, LCSW

## 2015-06-05 NOTE — Progress Notes (Signed)
3 Days Post-Op  Subjective: Pt with BM On clears PT/OT  Seeing her  Wound vac per WOCN  Objective: Vital signs in last 24 hours: Temp:  [98.4 F (36.9 C)-98.7 F (37.1 C)] 98.7 F (37.1 C) (02/03 0549) Pulse Rate:  [72-82] 77 (02/03 0549) Resp:  [16-23] 22 (02/03 0549) BP: (152-185)/(54-63) 161/54 mmHg (02/03 0549) SpO2:  [92 %-97 %] 95 % (02/03 0549) Weight:  [64.9 kg (143 lb 1.3 oz)] 64.9 kg (143 lb 1.3 oz) (02/03 0353) Last BM Date: 06/02/15  Intake/Output from previous day: 02/02 0701 - 02/03 0700 In: 2870.9 [P.O.:480; I.V.:2390.9] Out: 2515 [Urine:2475; Drains:40] Intake/Output this shift: Total I/O In: -  Out: 100 [Urine:100]  Incision/Wound:VAC IN PLACE SORE SOFT ND  NO WHEEZING   Lab Results:   Recent Labs  06/03/15 0415 06/04/15 0424  WBC 17.6* 17.3*  HGB 10.4* 10.5*  HCT 31.9* 33.9*  PLT 153 167   BMET  Recent Labs  06/03/15 0415 06/04/15 0424  NA 142 146*  K 4.9 5.7*  CL 114* 116*  CO2 20* 21*  GLUCOSE 127* 137*  BUN 18 14  CREATININE 1.53* 1.36*  CALCIUM 7.2* 8.1*   PT/INR No results for input(s): LABPROT, INR in the last 72 hours. ABG  Recent Labs  06/02/15 1315  PHART 7.243*  HCO3 16.8*    Studies/Results: No results found.  Anti-infectives: Anti-infectives    Start     Dose/Rate Route Frequency Ordered Stop   06/02/15 [id number]  clindamycin (CLEOCIN) 900 MG/50ML IVPB    Comments:  Ara Kussmaul   : cabinet override      06/02/15 [id number] 06/02/15 0747   06/02/15 0700  clindamycin (CLEOCIN) IVPB 900 mg     900 mg 100 mL/hr over 30 Minutes Intravenous 60 min pre-op 06/02/15 0700     06/02/15 0700  gentamicin (GARAMYCIN) 300 mg in dextrose 5 % 100 mL IVPB     5 mg/kg  60.8 kg 107.5 mL/hr over 60 Minutes Intravenous 60 min pre-op 06/02/15 0700 06/02/15 0841      Assessment/Plan: s/p Procedure(s): COLOSTOMY CLOSURE (N/A) PARTIAL COLECTOMY (N/A) Keep on clears Check labs Continue wound vac Patient Active Problem List   Diagnosis Date Noted  . Colostomy in place [facility]) 06/02/2015  . S/P colostomy ([place]) 06/02/2015  . Subclavian steal syndrome   . Acute on chronic diastolic heart failure ([place])   . Ileus ([place])   . Hypoxia   . 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 Abdominal pain 09/29/2014  . CKD (chronic kidney disease) stage 3, GFR 30-59 ml/min 09/29/2014  . GERD (gastroesophageal reflux disease) 09/29/2014  . Abnormal cardiac function test 09/22/2014  . CAD S/P remote PCI (no details) 09/22/2014  . PVD- remote Lt [facility] PTA (Dr Kellie Simmering) 09/22/2014  . COPD (chronic obstructive pulmonary disease) ([place]) 08/11/2014  . Essential hypertension 10/16/2013  . Cervical radiculopathy at C7 06/04/2013  . Dyslipidemia 12/13/2012  . Colovaginal fistula s/p colectomy/colostomy/repair 10/02/2014 11/15/2012  . Tobacco user 09/25/2012  . Kidney stone 09/25/2012  . NSTEMI (non-ST elevated myocardial infarction) ([place]) 08/07/2012    LOS: 3 days    Alexa Key A. 06/05/2015

## 2015-06-05 NOTE — Care Management Important Message (Signed)
Important Message  Patient Details  Name: Alexa Key MRN: [id number] Date of Birth: Sep 04, 1950   Medicare Important Message Given:  Yes    [name] 06/05/2015, 4:23 PM

## 2015-06-05 NOTE — Care Management Note (Signed)
Case Management Note  Patient Details  Name: Alexa Key MRN: [id number] Date of Birth: 05/19/1950  Subjective/Objective:                    Action/Plan:  UR updated  Expected Discharge Date:                  Expected Discharge Plan:  Skilled Nursing Facility  In-House Referral:  Clinical Social Work  Discharge planning Services  CM Consult  Post Acute Care Choice:    Choice offered to:     DME Arranged:    DME Agency:     HH Arranged:    [place] Agency:     Status of Service:  In process, will continue to follow  Medicare Important Message Given:    Date Medicare IM Given:    Medicare IM give by:    Date Additional Medicare IM Given:    Additional Medicare Important Message give by:     If discussed at [place] of Stay Meetings, dates discussed:    Additional Comments:  Marilu Favre, RN 06/05/2015, 11:44 AM

## 2015-06-06 NOTE — Progress Notes (Signed)
Patient ID: Alexa Key, female   DOB: 04/16/1951, 65 y.o.   MRN: [id number] [facility] Progress Note:   4 Days Post-Op  Subjective: Mental status is alert; on bedpan trying to have a BM Objective: Vital signs in last 24 hours: Temp:  [98.3 F (36.8 C)-100 F (37.8 C)] 98.3 F (36.8 C) (02/04 0545) Pulse Rate:  [58-76] 62 (02/04 0545) Resp:  [18-22] 18 (02/04 0545) BP: (140-169)/(44-61) 140/61 mmHg (02/04 0545) SpO2:  [90 %-97 %] 94 % (02/04 0545) Weight:  [62.9 kg (138 lb 10.7 oz)] 62.9 kg (138 lb 10.7 oz) (02/04 0618)  Intake/Output from previous day: 02/03 0701 - 02/04 0700 In: 2428.4 [P.O.:360; I.V.:1909.5; IV Piggyback:158.9] Out: 1200 [Urine:1200] Intake/Output this shift:    Physical Exam: Work of breathing is not labored.  No complaints.  Taking liquids  Lab Results:  Results for orders placed or performed during the hospital encounter of 06/02/15 (from the past 48 hour(s))  CBC     Status: Abnormal   Collection Time: 06/05/15  9:19 AM  Result Value Ref Range   WBC 15.8 (H) 4.0 - 10.5 K/uL   RBC 3.20 (L) 3.87 - 5.11 MIL/uL   Hemoglobin 10.0 (L) 12.0 - 15.0 g/dL   HCT 31.9 (L) 36.0 - 46.0 %   MCV 99.7 78.0 - 100.0 fL   MCH 31.3 26.0 - 34.0 pg   MCHC 31.3 30.0 - 36.0 g/dL   RDW 14.0 11.5 - 15.5 %   Platelets 169 150 - 400 K/uL  Comprehensive metabolic panel     Status: Abnormal   Collection Time: 06/05/15  9:19 AM  Result Value Ref Range   Sodium 143 135 - 145 mmol/L   Potassium 4.6 3.5 - 5.1 mmol/L   Chloride 112 (H) 101 - 111 mmol/L   CO2 25 22 - 32 mmol/L   Glucose, Bld 119 (H) 65 - 99 mg/dL   BUN 9 6 - 20 mg/dL   Creatinine, Ser 1.23 (H) 0.44 - 1.00 mg/dL   Calcium 8.6 (L) 8.9 - 10.3 mg/dL   Total Protein 5.5 (L) 6.5 - 8.1 g/dL   Albumin 2.3 (L) 3.5 - 5.0 g/dL   AST 14 (L) 15 - 41 U/L   ALT 11 (L) 14 - 54 U/L   Alkaline Phosphatase 62 38 - 126 U/L   Total Bilirubin 0.5 0.3 - 1.2 mg/dL   GFR calc non Af Amer 45 (L) >60 mL/min   GFR calc  Af Amer 52 (L) >60 mL/min    Comment: (NOTE) The eGFR has been calculated using the CKD EPI equation. This calculation has not been validated in all clinical situations. eGFR's persistently <60 mL/min signify possible Chronic Kidney Disease.    Anion gap 6 5 - 15    Radiology/Results: No results found.  Anti-infectives: Anti-infectives    Start     Dose/Rate Route Frequency Ordered Stop   06/02/15 0712  clindamycin (CLEOCIN) 900 MG/50ML IVPB    Comments:  Ara Kussmaul   : cabinet override      06/02/15 0712 06/02/15 0747   06/02/15 0700  clindamycin (CLEOCIN) IVPB 900 mg     900 mg 100 mL/hr over 30 Minutes Intravenous 60 min pre-op 06/02/15 0700     06/02/15 0700  gentamicin (GARAMYCIN) 300 mg in dextrose 5 % 100 mL IVPB     5 mg/kg  60.8 kg 107.5 mL/hr over 60 Minutes Intravenous 60 min pre-op 06/02/15 0700 06/02/15 0841  Assessment/Plan: Problem List: Patient Active Problem List   Diagnosis Date Noted  . Colostomy in place [facility]) 06/02/2015  . S/P colostomy ([place]) 06/02/2015  . Subclavian steal syndrome   . Acute on chronic diastolic heart failure ([place])   . Ileus ([place])   . Hypoxia   . 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 Abdominal pain 09/29/2014  . CKD (chronic kidney disease) stage 3, GFR 30-59 ml/min 09/29/2014  . GERD (gastroesophageal reflux disease) 09/29/2014  . Abnormal cardiac function test 09/22/2014  . CAD S/P remote PCI (no details) 09/22/2014  . PVD- remote Lt SCA PTA (Dr Kellie Simmering) 09/22/2014  . COPD (chronic obstructive pulmonary disease) ([place]) 08/11/2014  . Essential hypertension 10/16/2013  . Cervical radiculopathy at C7 06/04/2013  . Dyslipidemia 12/13/2012  . Colovaginal fistula s/p colectomy/colostomy/repair 10/02/2014 11/15/2012  . Tobacco user 09/25/2012  . Kidney stone 09/25/2012  . NSTEMI (non-ST elevated myocardial infarction) ([place]) 08/07/2012    Slow progress post colostomy  closure 4 Days Post-Op    LOS: 4 days   Matt B. Hassell Done, MD, [facility] [phone] beeper [phone]  06/06/2015 7:48 AM

## 2015-06-07 MED ORDER — METOPROLOL TARTRATE 12.5 MG HALF TABLET
12.5000 mg | ORAL_TABLET | Freq: Two times a day (BID) | ORAL | Status: DC
  Administered 2015-06-07 – 2015-06-10 (×6): 12.5 mg via ORAL
  Filled 2015-06-07 (×7): qty 1

## 2015-06-07 MED ORDER — TRAMADOL HCL 50 MG PO TABS
50.0000 mg | ORAL_TABLET | Freq: Four times a day (QID) | ORAL | Status: DC | PRN
  Administered 2015-06-07 – 2015-06-08 (×2): 50 mg via ORAL
  Filled 2015-06-07 (×3): qty 1

## 2015-06-07 NOTE — Progress Notes (Signed)
Patient is starting to have more loose stools. 6 in total since about 3am. MD on-call has been paged. States that he is ok with that. Wants bowls cleaned out.

## 2015-06-07 NOTE — Clinical Social Work Note (Signed)
Clinical Social Work Assessment  Patient Details  Name: Alexa Key MRN: [id number] Date of Birth: 02-Aug-1950  Date of referral:  06/07/15               Reason for consult:                   Permission sought to share information with:  Facility Sport and exercise psychologist, Family Supports Permission granted to share information::  Yes, Verbal Permission Granted  Name::     Alexa Key  Agency::  [place], Web designer, [facility]  Relationship::  daughter  Contact Information:     Housing/Transportation Living arrangements for the past 2 months:  [place] of Information:  Patient, Adult Children Patient Interpreter Needed:  None Criminal Activity/Legal Involvement Pertinent to Current Situation/Hospitalization:  No - Comment as needed Significant Relationships:  Adult Children Lives with:  Relatives Do you feel safe going back to the place where you live?  Yes Need for family participation in patient care:  No (Coment)  Care giving concerns:  Pt will need 24/7 care upon d/c.  Pt's grandson lives with her but works the night-shift.   Social Worker assessment / plan:  Met with Pt and Pt's daughter, Alexa Key.  Pt and daughter are in agreement with SNF, as the family cannot provide 24-hr care, at this time.  Pt has been to [facility], by hx, and would like to return there upon d/c.  She is agreeable to [facility] SNF, should [facility] not be an option. Employment status:  Retired Nurse, adult PT Recommendations:  [place] / Referral to community resources:     Patient/Family's Response to care:  Pt seemed pleased with the care she is receiving at [facility].  Patient/Family's Understanding of and Emotional Response to Diagnosis, Current Treatment, and Prognosis:  Pt and daughter feel that SNF at d/c is necessary.  Emotional Assessment Appearance:  Appears stated age Attitude/Demeanor/Rapport:   (calm) Affect (typically  observed):  Accepting Orientation:  Oriented to Self, Oriented to Place, Oriented to  Time, Oriented to Situation Alcohol / Substance use:  Never Used Psych involvement (Current and /or in the community):  No (Comment)  Discharge Needs  Concerns to be addressed:  No discharge needs identified Readmission within the last 30 days:  No Current discharge risk:  None Barriers to Discharge:  No Barriers Identified   Matilde Bash, [place] 06/07/2015, 3:03 PM

## 2015-06-07 NOTE — Progress Notes (Signed)
Patient was in and out cath-residual was 760.

## 2015-06-07 NOTE — Clinical Social Work Placement (Signed)
   CLINICAL SOCIAL WORK PLACEMENT  NOTE  Date:  06/07/2015  Patient Details  Name: Alexa Key MRN: [id number] Date of Birth: 18-May-1950  Clinical Social Work is seeking post-discharge placement for this patient at the [place] level of care (*CSW will initial, date and re-position this form in  chart as items are completed):  Yes   Patient/family provided with [place] Work Department's list of facilities offering this level of care within the geographic area requested by the patient (or if unable, by the patient's family).  Yes   Patient/family informed of their freedom to choose among providers that offer the needed level of care, that participate in Medicare, Medicaid or managed care program needed by the patient, have an available bed and are willing to accept the patient.  Yes   Patient/family informed of [facility]'s ownership interest in [facility] and [facility], as well as of the fact that they are under no obligation to receive care at these facilities.  PASRR submitted to EDS on       PASRR number received on       Existing PASRR number confirmed on 06/05/15     FL2 transmitted to all facilities in geographic area requested by pt/family on 06/07/15     FL2 transmitted to all facilities within larger geographic area on       Patient informed that his/her managed care company has contracts with or will negotiate with certain facilities, including the following:            Patient/family informed of bed offers received.  Patient chooses bed at       Physician recommends and patient chooses bed at      Patient to be transferred to   on  .  Patient to be transferred to facility by       Patient family notified on   of transfer.  Name of family member notified:        PHYSICIAN       Additional Comment:    _______________________________________________ Matilde Bash, [place] 06/07/2015, 3:05 PM

## 2015-06-07 NOTE — Progress Notes (Signed)
Patient is beginning to have loose BM's. Patient is now 5 days post-op from colostomy reversal. Bm's do not smell at this time and patient continues to urinate with each bowel so it is hard to tell consistency.

## 2015-06-07 NOTE — Progress Notes (Signed)
Dr. Kieth Brightly notified that patient had 6 runs of SVT. BP stable and asymtomatic

## 2015-06-07 NOTE — Progress Notes (Signed)
Progress Note: General Surgery Service   Subjective: Multiple loose BMs overnight. Tolerating diet. Ambulated in room overnight. Pain controlled  Objective: Vital signs in last 24 hours: Temp:  [98 F (36.7 C)-99.2 F (37.3 C)] 98.6 F (37 C) (02/05 0516) Pulse Rate:  [54-71] 70 (02/05 0516) Resp:  [16-18] 18 (02/05 0516) BP: (129-169)/(55-71) 129/71 mmHg (02/05 0516) SpO2:  [88 %-99 %] 99 % (02/05 0516) Weight:  [63.1 kg (139 lb 1.8 oz)] 63.1 kg (139 lb 1.8 oz) (02/05 0414) Last BM Date: 06/04/15  Intake/Output from previous day: 02/04 0701 - 02/05 0700 In: 1065 [P.O.:240; I.V.:825] Out: 2210 [Urine:2210] Intake/Output this shift:    Lungs: CTAB  Cardiovascular: RRR  Abd: soft, NT, ND, vac in place no erythema  Extremities: no edema  Neuro: AOx4  Lab Results: CBC   Recent Labs  06/05/15 0919  WBC 15.8*  HGB 10.0*  HCT 31.9*  PLT 169   BMET  Recent Labs  06/05/15 0919  NA 143  K 4.6  CL 112*  CO2 25  GLUCOSE 119*  BUN 9  CREATININE 1.23*  CALCIUM 8.6*   PT/INR No results for input(s): LABPROT, INR in the last 72 hours. ABG No results for input(s): PHART, HCO3 in the last 72 hours.  Invalid input(s): PCO2, PO2  Studies/Results:  Anti-infectives: Anti-infectives    Start     Dose/Rate Route Frequency Ordered Stop   06/02/15 0712  clindamycin (CLEOCIN) 900 MG/50ML IVPB    Comments:  Ara Kussmaul   : cabinet override      06/02/15 0712 06/02/15 0747   06/02/15 0700  clindamycin (CLEOCIN) IVPB 900 mg     900 mg 100 mL/hr over 30 Minutes Intravenous 60 min pre-op 06/02/15 0700     06/02/15 0700  gentamicin (GARAMYCIN) 300 mg in dextrose 5 % 100 mL IVPB     5 mg/kg  60.8 kg 107.5 mL/hr over 60 Minutes Intravenous 60 min pre-op 06/02/15 0700 06/02/15 0841      Medications: Scheduled Meds: . amLODipine  10 mg Oral Daily  . antiseptic oral rinse  7 mL Mouth Rinse BID  . budesonide-formoterol  2 puff Inhalation BID  . clindamycin  (CLEOCIN) IV  900 mg Intravenous 60 min Pre-Op  . enoxaparin (LOVENOX) injection  40 mg Subcutaneous Q24H  . isosorbide mononitrate  30 mg Oral Daily  . metoprolol  5 mg Intravenous 4 times per day  . pravastatin  80 mg Oral QHS  . saccharomyces boulardii  250 mg Oral BID  . tiotropium  18 mcg Inhalation Daily   Continuous Infusions: . dextrose 5 % and 0.45% NaCl 75 mL/hr at 06/07/15 0529   PRN Meds:.acetaminophen, albuterol, alum & mag hydroxide-simeth, HYDROmorphone (DILAUDID) injection, ondansetron **OR** ondansetron (ZOFRAN) IV, oxyCODONE-acetaminophen, zolpidem  Assessment/Plan: Patient Active Problem List   Diagnosis Date Noted  . Colostomy in place [facility]) 06/02/2015  . S/P colostomy ([place]) 06/02/2015  . Subclavian steal syndrome   . Acute on chronic diastolic heart failure ([place])   . Ileus ([place])   . Hypoxia   . 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 Abdominal pain 09/29/2014  . CKD (chronic kidney disease) stage 3, GFR 30-59 ml/min 09/29/2014  . GERD (gastroesophageal reflux disease) 09/29/2014  . Abnormal cardiac function test 09/22/2014  . CAD S/P remote PCI (no details) 09/22/2014  . PVD- remote Lt SCA PTA (Dr Kellie Simmering) 09/22/2014  . COPD (chronic obstructive pulmonary disease) ([place]) 08/11/2014  . Essential hypertension 10/16/2013  .  Cervical radiculopathy at C7 06/04/2013  . Dyslipidemia 12/13/2012  . Colovaginal fistula s/p colectomy/colostomy/repair 10/02/2014 11/15/2012  . Tobacco user 09/25/2012  . Kidney stone 09/25/2012  . NSTEMI (non-ST elevated myocardial infarction) ([place]) 08/07/2012   s/p Procedure(s): COLOSTOMY CLOSURE PARTIAL COLECTOMY 06/02/2015  -advance diet -continue pain control -increase ambulation today   LOS: 5 days   Mickeal Skinner, MD Pg# [phone] [facility]

## 2015-06-08 DIAGNOSIS — I471 Supraventricular tachycardia: Secondary | ICD-10-CM

## 2015-06-08 DIAGNOSIS — I2510 Atherosclerotic heart disease of native coronary artery without angina pectoris: Secondary | ICD-10-CM

## 2015-06-08 DIAGNOSIS — I472 Ventricular tachycardia: Secondary | ICD-10-CM

## 2015-06-08 LAB — COMPREHENSIVE METABOLIC PANEL
ALK PHOS: 66 U/L (ref 38–126)
ALT: 9 U/L — ABNORMAL LOW (ref 14–54)
ANION GAP: 11 (ref 5–15)
AST: 12 U/L — ABNORMAL LOW (ref 15–41)
Albumin: 2.5 g/dL — ABNORMAL LOW (ref 3.5–5.0)
BUN: 8 mg/dL (ref 6–20)
CALCIUM: 8.3 mg/dL — AB (ref 8.9–10.3)
CHLORIDE: 102 mmol/L (ref 101–111)
CO2: 25 mmol/L (ref 22–32)
Creatinine, Ser: 1.24 mg/dL — ABNORMAL HIGH (ref 0.44–1.00)
GFR calc non Af Amer: 44 mL/min — ABNORMAL LOW (ref >60)
GFR, EST AFRICAN AMERICAN: 51 mL/min — AB (ref >60)
Glucose, Bld: 126 mg/dL — ABNORMAL HIGH (ref 65–99)
POTASSIUM: 3.4 mmol/L — AB (ref 3.5–5.1)
SODIUM: 138 mmol/L (ref 135–145)
Total Bilirubin: 0.7 mg/dL (ref 0.3–1.2)
Total Protein: 5.8 g/dL — ABNORMAL LOW (ref 6.5–8.1)

## 2015-06-08 LAB — MAGNESIUM: Magnesium: 2.1 mg/dL (ref 1.7–2.4)

## 2015-06-08 MED ORDER — TRAMADOL HCL 50 MG PO TABS
50.0000 mg | ORAL_TABLET | Freq: Four times a day (QID) | ORAL | Status: DC | PRN
  Administered 2015-06-08: 50 mg via ORAL

## 2015-06-08 MED ORDER — POTASSIUM CHLORIDE CRYS ER 20 MEQ PO TBCR
40.0000 meq | EXTENDED_RELEASE_TABLET | Freq: Once | ORAL | Status: AC
  Administered 2015-06-08: 40 meq via ORAL
  Filled 2015-06-08: qty 2

## 2015-06-08 NOTE — Progress Notes (Signed)
Patient had 6 beat run of VT. Asymptomatic BP 146/55 Pulse 58 Dr. Esmond Harps office notified.  Waiting return call

## 2015-06-08 NOTE — Care Management Important Message (Signed)
Important Message  Patient Details  Name: Alexa Key MRN: [id number] Date of Birth: 07/15/1950   Medicare Important Message Given:  Yes    Louanne Belton 06/08/2015, 10:56 AMImportant Message  Patient Details  Name: Alexa Key MRN: [id number] Date of Birth: Dec 02, 1950   Medicare Important Message Given:  Yes    Denman Pichardo G 06/08/2015, 10:55 AM

## 2015-06-08 NOTE — Consult Note (Signed)
Admit date: 06/02/2015 Referring Physician  Dr. Grandville Silos Primary Physician [name], MD Primary Cardiologist  Dr. Percival Spanish Reason for Consultation  SVT/VT  HPI: 65 female with coronary artery disease with recent heart catheterization by Dr. Burt Knack on 10/15/14 showing diffuse moderate coronary artery disease with diffuse calcification, moderate overall disease, no specific PCI targets-medical management, who preceding cardiac catheterization underwent nuclear stress test showing ejection fraction of 52% who we are being consult on to discuss SVT/ventricular tachycardia.  Telemetry personally viewed which shows 5 beats of wide complex tachycardia, nonsustained ventricular tachycardia-slow rate below 150 bpm at 1300 on 06/08/15. She then had 2 other arrhythmias both of which appeared to be supraventricular tachycardia in the evening showing approximately 15 beats each of paroxysmal atrial tachycardia, narrow complex.  She is currently 6 days postoperative partial colectomy with colostomy closure hopefully awaiting bed placement at skilled nursing facility soon.  On medication list, she is currently taking metoprolol tartrate 12.5 mg twice a day, beta blocker as well as isosorbide 30 mg once a day, amlodipine 10 mg once a day, pravastatin 80 mg once a day.  She was asymptomatic with her transient tachycardia. She is not having any chest discomfort surrounding these issues.  PMH:   Past Medical History  Diagnosis Date  . CAD (coronary artery disease)     a. 07/2012 s/p MI ->not cath candidate 2/2 AKI; b. 09/2012 Neg MV;  c. 08/2014 [facility] CL: EF 45-54%, + ischemia, intermittent risk study.  Marland Kitchen PVD (peripheral vascular disease) ([place])   . CVA (cerebral infarction)     TIAx2  . HTN (hypertension)   . HLD (hyperlipidemia)   . Tobacco abuse   . Diverticulitis 08/04/12  . S/P partial lobectomy of lung 1997    per [facility], incidental finding, path c/w benign lesion  . Stroke ([place])   . COPD (chronic  obstructive pulmonary disease) ([place])   . Fibromyalgia   . PONV (postoperative nausea and vomiting)   . Anxiety   . Diabetes mellitus without complication ([place])     on no meds   . GERD (gastroesophageal reflux disease)   . H/O echocardiogram     a. 07/2012 Echo: EF 50-55%, grade 2 DD, mild LVH.  Marland Kitchen Abnormal cardiac function test 09/22/2014  . Subclavian steal syndrome 2004    Dr. Kellie Simmering. Angioplasty according to patient  . Asthma     PSH:   Past Surgical History  Procedure Laterality Date  . Hysterectomoy    . Lumbar spine surgery    . Carpal tunnel release    . Coronary angioplasty    . Coronary stents     . Partial lobectomy of lung   1998  . Abdominal hysterectomy    . Back surgery    . Nephrolithotomy Right 12/24/2012    Procedure: NEPHROLITHOTOMY PERCUTANEOUS;  Surgeon: Franchot Gallo, MD;  Location: [facility];  Service: Urology;  Laterality: Right;  . Flexible sigmoidoscopy  07/11/2014    Procedure: FLEXIBLE SIGMOIDOSCOPY;  Surgeon: Leighton Ruff, MD;  Location: [facility];  Service: Endoscopy;;  . Laparotomy N/A 10/02/2014    Procedure: EXPLORATORY LAPAROTOMY WITH PARTIAL COLECTOMY/COLOSTOMY;  Surgeon: Erroll Luna, MD;  Location: [place];  Service: General;  Laterality: N/A;  . Cardiac catheterization N/A 10/15/2014    Procedure: Left Heart Cath and Coronary Angiography;  Surgeon: Sherren Mocha, MD;  Location: [facility];  Service: Cardiovascular;  Laterality: N/A;  . Colonoscopy N/A 01/29/2015    Procedure: COLONOSCOPY;  Surgeon: Leighton Ruff, MD;  [facility]: [facility];  Service: Endoscopy;  Laterality: N/A;  . Colostomy closure N/A 06/02/2015    Procedure: COLOSTOMY CLOSURE;  Surgeon: Erroll Luna, MD;  Location: [place];  Service: General;  Laterality: N/A;  . Partial colectomy N/A 06/02/2015    Procedure: PARTIAL COLECTOMY;  Surgeon: Erroll Luna, MD;  Location: [place];  Service: General;  Laterality: N/A;   Allergies:  Penicillins and Sulfa antibiotics Prior  to Admit Meds:   Prior to Admission medications   Medication Sig Start Date End Date Taking? Authorizing Provider  acetaminophen (TYLENOL) 500 MG tablet Take 1,000 mg by mouth every 6 (six) hours as needed for pain.   Yes Historical Provider, MD  albuterol (PROVENTIL HFA;VENTOLIN HFA) 108 (90 BASE) MCG/ACT inhaler Inhale 2 puffs into the lungs every 6 (six) hours as needed for wheezing or shortness of breath. 08/01/14  Yes Tiffany A Gann, PA-C  amLODipine (NORVASC) 10 MG tablet Take 1 tablet (10 mg total) by mouth daily. 12/22/14  Yes [name], MD  aspirin 325 MG tablet Take 325 mg by mouth daily.   Yes Historical Provider, MD  budesonide-formoterol (SYMBICORT) 160-4.5 MCG/ACT inhaler Inhale 2 puffs into the lungs 2 (two) times daily. 12/13/12  Yes Vernie Shanks, MD  cholecalciferol (VITAMIN D) 1000 UNITS tablet Take 2 tablets (2,000 Units total) by mouth at bedtime. 09/15/14  Yes Tiffany A Gann, PA-C  cholecalciferol (VITAMIN D) 1000 units tablet Take 1,000 Units by mouth daily.   Yes Historical Provider, MD  isosorbide mononitrate (IMDUR) 30 MG 24 hr tablet Take 1 tablet (30 mg total) by mouth daily. 05/12/15  Yes [name], MD  metoprolol tartrate (LOPRESSOR) 25 MG tablet Take 0.5 tablets (12.5 mg total) by mouth 2 (two) times daily. 12/22/14  Yes [name], MD  omeprazole (PRILOSEC) 20 MG capsule TAKE 1 CAPSULE (20 MG TOTAL)  BY MOUTH DAILY. 05/12/15  Yes [name], MD  polyethylene glycol [facility] / GLYCOLAX) packet Take 17 g by mouth daily as needed for mild constipation. 10/21/14  Yes Annita Brod, MD  pravastatin (PRAVACHOL) 80 MG tablet Take 1 tablet (80 mg total) by mouth at bedtime. 11/23/14  Yes [name], MD  tiotropium (SPIRIVA) 18 MCG inhalation capsule Place 1 capsule (18 mcg total) into inhaler and inhale daily. 10/21/14  Yes Annita Brod, MD  tiZANidine (ZANAFLEX) 2 MG tablet Take 1 tablet (2 mg total) by mouth every 6 (six) hours as needed. 10/21/14   Annita Brod, MD  Vitamin D, Ergocalciferol, (DRISDOL) 50000 UNITS CAPS capsule Take 1 capsule (50,000 Units total) by mouth 2 (two) times a week. Patient not taking: Reported on 05/20/2015 12/23/14   [name], MD   Fam HX:    Family History  Problem Relation Age of Onset  . Kidney disease Mother    Social HX:    Social History   Social History  . Marital Status: Divorced    Spouse Name: N/A  . Number of Children: N/A  . Years of Education: N/A   Occupational History  . Not on file.   Social History Main Topics  . Smoking status: Former Smoker -- 0.25 packs/day for 49 years    Types: Cigarettes    Quit date: 09/29/2014  . Smokeless tobacco: Never Used  . Alcohol Use: No  . Drug Use: No  . Sexual Activity: Not Currently   Other Topics Concern  . Not on file   Social History Narrative     ROS:  Denies any chest pain today, no shortness of breath, no fevers, no chills, no syncope, no palpitations All 11 ROS were addressed and are negative except what is stated in the HPI   Physical Exam: Blood pressure 165/53, pulse 51, temperature 98.8 F (37.1 C), temperature source Oral, resp. rate 18, height 5\' 1"  (1.549 m), weight 140 lb 14 oz (63.9 kg), SpO2 94 %.   General: Well developed, well nourished, in no acute distress, laying on side in bed, flatus Head: Eyes PERRLA, No xanthomas.   Normal cephalic and atramatic  Lungs:   Clear bilaterally to auscultation and percussion. Normal respiratory effort. No wheezes, no rales. Heart:   HRRR/mildly bradycardic at times S1 S2 Pulses are 2+ & equal. No murmur, rubs, gallops.  No carotid bruit. No JVD.  No abdominal bruits.  Abdomen: Bowel sounds are positive, abdomen soft and non-tender without masses. No hepatosplenomegaly. Postoperative changes Msk:  Back normal. Normal strength and tone for age. Extremities:  No clubbing, cyanosis or edema.  DP +1 Neuro: Alert and oriented X 3, non-focal, MAE x 4 GU: Deferred Rectal:  Deferred Psych:  Good affect, responds appropriately      Labs: Lab Results  Component Value Date   WBC 15.8* 06/05/2015   HGB 10.0* 06/05/2015   HCT 31.9* 06/05/2015   MCV 99.7 06/05/2015   PLT 169 06/05/2015     Recent Labs Lab 06/08/15 1622  NA 138  K 3.4*  CL 102  CO2 25  BUN 8  CREATININE 1.24*  CALCIUM 8.3*  PROT 5.8*  BILITOT 0.7  ALKPHOS 66  ALT 9*  AST 12*  GLUCOSE 126*   No results for input(s): CKTOTAL, CKMB, TROPONINI in the last 72 hours. Lab Results  Component Value Date   CHOL 155 12/22/2014   HDL 36* 12/22/2014   LDLCALC 81 12/22/2014   TRIG 188* 12/22/2014   No results found for: [facility]   Radiology:  No results found. Personally viewed.  EKG:  06/08/15 at 7:03 PM shows sinus rhythm with nonspecific ST segment changes, no significant change from prior EKG from 10/13/2014. Personally viewed.   Echocardiogram 08/07/12-EF 50-55%.  Nuclear stress test 09/10/14-EF 52%  Cardiac catheterization 10/15/14   FINAL CONCLUSIONS:  DIFFUSE CALCIFIED CORONARY ARTERY DISEASE WITH MODERATE RCA STENOSES, MODERATE LCX STENOSIS, MILD LAD STENOSIS, AND SEVERE STENOSIS OF A SMALL FIRST DIAGONAL BRANCH    RECOMMENDATIONS:  IN MY OPINION THE PATIENT DOES NOT HAVE HIGH-RISK CORONARY ANATOMY WITH PATENCY OF HER LEFT MAIN AND LAD, AND NON-CRITICAL DISEASE INVOLVING THE LCX AND RCA. I THINK MEDICAL THERAPY IS APPROPRIATE. HER CARDIAC RISK OF SURGERY IS MODERATELY INCREASED BUT IS APPROPRIATE TO MANAGE MEDICALLY.  ASSESSMENT/PLAN:    65 year old female postoperative day 6 colectomy with paroxysmal supraventricular tachycardia as well as one brief episode of slow nonsustained ventricular tachycardia.  PSVT  - Short burst approximately 15 beats duration of what appears to be paroxysmal atrial tachycardia.  - Continue with low-dose metoprolol 12.5 mg twice a day  - I would not increase at this time given her resting bradycardia at times.  - Prior ejection fraction  low normal at 52% on nuclear stress test  - Continue to make sure electrolytes are normal, K greater than 4 for instance. I will go ahead and give her potassium 40 mEq.  Nonsustained ventricular tachycardia  - 5 beats of wide-complex tachycardia less than 150 bpm. Possibly nonsustained ventricular tachycardia. Continue with beta blocker. Ejection fraction 52%. Prior cardiac catheterization in June 2016  less than one year ago demonstrating moderate diffuse coronary artery disease. Continue with aggressive medical management. She was asymptomatic. Unable to increase beta blocker secondary to resting bradycardia.  - No high-risk symptoms such as chest pain, syncope  CAD  - Moderate diffuse disease. Cardiac catheterization reviewed.  - Medical management.  - Continue isosorbide.  No further cardiac intervention necessary. I spoke with her daughter on the telephone.  She is fine from cardiac perspective to be transferred to skilled nursing facility once bed is available.  Please let us know if we can be of further assistance.  Candee Furbish, MD  06/08/2015  7:53 PM

## 2015-06-08 NOTE — Progress Notes (Signed)
Physical Therapy Treatment Patient Details Name: Alexa Key MRN: [id number] DOB: 1950-09-18 Today's Date: 06/08/2015    History of Present Illness Pt is a 65 y/o F s/p colostomy closure and partial colectomy.  Pt's PMH includes MI, PVD, CVA, partial lobectomy of lung, fibromyalgia, anxiety, subclavian steal syndrome, DM.    PT Comments    Pt is progressing with mobility, ambulated 49' with RW and min A, continues to have abdominal pain with mobility. PT will continue to follow.   Follow Up Recommendations  SNF;Supervision for mobility/OOB     Equipment Recommendations  Other (comment)    Recommendations for Other Services       Precautions / Restrictions Precautions Precautions: Fall Precaution Comments: wound vac abdomen Restrictions Weight Bearing Restrictions: No    Mobility  Bed Mobility Overal bed mobility: Needs Assistance Bed Mobility: Supine to Sit;Sit to Supine     Supine to sit: Min assist Sit to supine: Min assist   General bed mobility comments: pt trying to sit straight up despite education from previous therapists for log rolling, vc's given and pt able to roll to left and initiate sitting, min HHA to achieve full upright positioning. Min A needed for return to supine for support of LE's to decrease strain on abdomen  Transfers Overall transfer level: Needs assistance Equipment used: Rolling walker (2 wheeled);None Transfers: Sit to/from [employer] to Stand: Min guard Stand pivot transfers: Min guard       General transfer comment: min-guard A for safety with SPT bed to [facility] without AD, min-guard from [facility] to RW. no LOB  Ambulation/Gait Ambulation/Gait assistance: Min assist Ambulation Distance (Feet): 80 Feet Assistive device: Rolling walker (2 wheeled) Gait Pattern/deviations: Step-through pattern;Decreased stride length;Trunk flexed Gait velocity: decreased Gait velocity interpretation: <1.8 ft/sec, indicative of risk for  recurrent falls General Gait Details: guarded gait due to abdominal pain, decreased step length. Pt fatigued quickly with ambulation, standing rest break x1 and slow pace   Stairs            Wheelchair Mobility    Modified Rankin (Stroke Patients Only)       Balance Overall balance assessment: Needs assistance Sitting-balance support: No upper extremity supported Sitting balance-Leahy Scale: Fair     Standing balance support: No upper extremity supported Standing balance-Leahy Scale: Fair Standing balance comment: pt was able to stand to pull up brief without UE support, close guarding for safety                    Cognition Arousal/Alertness: Awake/alert Behavior During Therapy: WFL for tasks assessed/performed Overall Cognitive Status: Within Functional Limits for tasks assessed                      Exercises      General Comments        Pertinent Vitals/Pain Pain Assessment: Faces Faces Pain Scale: Hurts even more Pain Location: abdomen Pain Descriptors / Indicators: Grimacing Pain Intervention(s): Limited activity within patient's tolerance;Monitored during session;Premedicated before session        O2 sats 86% on 1 L O2  Home Living                      Prior Function            PT Goals (current goals can now be found in the care plan section) Acute Rehab PT Goals Patient Stated Goal: to decrease pain and nausea PT Goal  Formulation: With patient/family Time For Goal Achievement: 06/18/15 Potential to Achieve Goals: Fair Progress towards PT goals: Progressing toward goals    Frequency  Min 2X/week    PT Plan Current plan remains appropriate    Co-evaluation             End of Session Equipment Utilized During Treatment: Oxygen Activity Tolerance: Patient limited by fatigue Patient left: in bed;with call bell/phone within reach     Time: 1143-1202 PT Time Calculation (min) (ACUTE ONLY): 19 min  Charges:   $Gait Training: 8-22 mins                    G Codes:     Leighton Roach, PT  Acute Rehab Services  [phone]  Leighton Roach 06/08/2015, 12:10 PM

## 2015-06-08 NOTE — Progress Notes (Signed)
SVT on cardiac monitoring. Dr. Grandville Silos notified and EKG order received.

## 2015-06-08 NOTE — Consult Note (Addendum)
WOC wound follow up Wound type: surgical full thickness to midline abd Wound bed: subcutaneous tissue, clean, moist Drainage (amount, consistency, odor) minimal amt tan drainage in VAC canister Periwound: intact  Dressing procedure/placement/frequency: Protected skin between takedown site and between open abdominal wounds with VAC drape. 1pc of black foam used to fill proximal end of midline surgical wound, 1pc of black foam used to fill distal end of open midline surgical wound, 1pc of black foam used to fill take down site. 1pc of black foam used as bridge for all sites.  Patient tolerated well, however she is quite anxious and required someone to hold her hand during dressing. Pain meds given prior to procedure but process was still painful. Cont suction at 133mmHG. Plan for dressing change Wed. Julien Girt MSN, RN, [place], [place], [place]

## 2015-06-08 NOTE — Progress Notes (Signed)
6 Days Post-Op  Subjective: BOWELS MOVING  POOR APPETITE  Objective: Vital signs in last 24 hours: Temp:  [97.4 F (36.3 C)-98.9 F (37.2 C)] 98.3 F (36.8 C) (02/06 0640) Pulse Rate:  [55-65] 63 (02/06 0640) Resp:  [18] 18 (02/06 0640) BP: (149-189)/(51-65) 189/59 mmHg (02/06 0640) SpO2:  [95 %-98 %] 97 % (02/06 0640) Weight:  [63.9 kg (140 lb 14 oz)] 63.9 kg (140 lb 14 oz) (02/06 0640) Last BM Date: 06/07/15  Intake/Output from previous day: 02/05 0701 - 02/06 0700 In: 2100 [P.O.:300; I.V.:1800] Out: 625 [Urine:600; Drains:25] Intake/Output this shift:    Incision/Wound:VAC IN PLACE SOFT ND ABD   Lab Results:   Recent Labs  06/05/15 0919  WBC 15.8*  HGB 10.0*  HCT 31.9*  PLT 169   BMET  Recent Labs  06/05/15 0919  NA 143  K 4.6  CL 112*  CO2 25  GLUCOSE 119*  BUN 9  CREATININE 1.23*  CALCIUM 8.6*   PT/INR No results for input(s): LABPROT, INR in the last 72 hours. ABG No results for input(s): PHART, HCO3 in the last 72 hours.  Invalid input(s): PCO2, PO2  Studies/Results: No results found.  Anti-infectives: Anti-infectives    Start     Dose/Rate Route Frequency Ordered Stop   06/02/15 0712  clindamycin (CLEOCIN) 900 MG/50ML IVPB    Comments:  Ara Kussmaul   : cabinet override      06/02/15 0712 06/02/15 0747   06/02/15 0700  clindamycin (CLEOCIN) IVPB 900 mg     900 mg 100 mL/hr over 30 Minutes Intravenous 60 min pre-op 06/02/15 0700     06/02/15 0700  gentamicin (GARAMYCIN) 300 mg in dextrose 5 % 100 mL IVPB     5 mg/kg  60.8 kg 107.5 mL/hr over 60 Minutes Intravenous 60 min pre-op 06/02/15 0700 06/02/15 0841      Assessment/Plan: s/p Procedure(s): COLOSTOMY CLOSURE (N/A) PARTIAL COLECTOMY (N/A) Advance diet Change VAC Hopefully place soon once bed available for SNIF  LOS: 6 days    Alexa Key A. 06/08/2015

## 2015-06-09 LAB — CREATININE, SERUM
CREATININE: 1.25 mg/dL — AB (ref 0.44–1.00)
GFR calc Af Amer: 51 mL/min — ABNORMAL LOW (ref >60)
GFR calc non Af Amer: 44 mL/min — ABNORMAL LOW (ref >60)

## 2015-06-09 MED ORDER — SODIUM CHLORIDE 0.9% FLUSH: 3.0000 mL | INTRAVENOUS | Status: DC | PRN

## 2015-06-09 MED ORDER — SODIUM CHLORIDE 0.9 % IV SOLN: 250.0000 mL | INTRAVENOUS | Status: DC | PRN

## 2015-06-09 MED ORDER — LOPERAMIDE HCL 2 MG PO CAPS
4.0000 mg | ORAL_CAPSULE | Freq: Once | ORAL | Status: AC
  Administered 2015-06-09: 4 mg via ORAL
  Filled 2015-06-09: qty 2

## 2015-06-09 MED ORDER — LOPERAMIDE HCL 2 MG PO CAPS
2.0000 mg | ORAL_CAPSULE | Freq: Four times a day (QID) | ORAL | Status: DC | PRN
Start: 2015-06-09 — End: 2015-06-10
  Administered 2015-06-10: 2 mg via ORAL
  Filled 2015-06-09: qty 1

## 2015-06-09 MED ORDER — SODIUM CHLORIDE 0.9% FLUSH
3.0000 mL | Freq: Two times a day (BID) | INTRAVENOUS | Status: DC
  Administered 2015-06-09: 3 mL via INTRAVENOUS

## 2015-06-09 MED ORDER — BOOST / RESOURCE BREEZE PO LIQD
1.0000 | Freq: Three times a day (TID) | ORAL | Status: DC
  Administered 2015-06-09 (×3): 1 via ORAL

## 2015-06-09 NOTE — Progress Notes (Signed)
Patient has accepted bed offer at [facility]. Facility has been informed of patient and family's selection. Per facility representative, patient can arrive to facility at anytime. CSW to inform patient and family of transfer time. Patient and family appreciative of CSW services.   Patient to be transported via [facility] to [facility]. D/C Summary to be sent via Hub system. No further needs were requested at this time. CSW to sign off.   Please re-consult if further CSW needs arise.   Lucius Conn, [place] Worker [facility] Ph: [phone]

## 2015-06-09 NOTE — Progress Notes (Signed)
7 Days Post-Op  Subjective: Had brief run of SVT and V tach cardiology saw her and cleared her for placement  Corrected K She states this happens at home  Passing gas moving bowels  Vac changed yesterday   Objective: Vital signs in last 24 hours: Temp:  [98.5 F (36.9 C)-98.8 F (37.1 C)] 98.6 F (37 C) (02/07 0520) Pulse Rate:  [51-66] 58 (02/07 0520) Resp:  [18] 18 (02/07 0520) BP: (146-168)/(52-69) 168/69 mmHg (02/07 0520) SpO2:  [94 %-100 %] 100 % (02/07 0520) Weight:  [64.7 kg (142 lb 10.2 oz)] 64.7 kg (142 lb 10.2 oz) (02/07 0500) Last BM Date: 06/07/15  Intake/Output from previous day: 02/06 0701 - 02/07 0700 In: 1527.5 [P.O.:360; I.V.:1167.5] Out: 400 [Urine:375; Drains:25] Intake/Output this shift:    Cardio: regular rate and rhythm, S1, S2 normal, no murmur, click, rub or gallop Incision/Wound:vac in place soft abdomen  Lab Results:  No results for input(s): WBC, HGB, HCT, PLT in the last 72 hours. BMET  Recent Labs  06/08/15 1622 06/09/15 0506  NA 138  --   K 3.4*  --   CL 102  --   CO2 25  --   GLUCOSE 126*  --   BUN 8  --   CREATININE 1.24* 1.25*  CALCIUM 8.3*  --    PT/INR No results for input(s): LABPROT, INR in the last 72 hours. ABG No results for input(s): PHART, HCO3 in the last 72 hours.  Invalid input(s): PCO2, PO2  Studies/Results: No results found.  Anti-infectives: Anti-infectives    Start     Dose/Rate Route Frequency Ordered Stop   06/02/15 [id number]  clindamycin (CLEOCIN) 900 MG/50ML IVPB    Comments:  Ara Kussmaul   : cabinet override      06/02/15 [id number] 06/02/15 0747   06/02/15 0700  clindamycin (CLEOCIN) IVPB 900 mg     900 mg 100 mL/hr over 30 Minutes Intravenous 60 min pre-op 06/02/15 0700     06/02/15 0700  gentamicin (GARAMYCIN) 300 mg in dextrose 5 % 100 mL IVPB     5 mg/kg  60.8 kg 107.5 mL/hr over 60 Minutes Intravenous 60 min pre-op 06/02/15 0700 06/02/15 0841      Assessment/Plan: s/p  Procedure(s): COLOSTOMY CLOSURE (N/A) PARTIAL COLECTOMY (N/A) Plan to place wed if bed available  SL IV Encourage po intake  Needs to continue PT /OT  Vac  Cardiac issues  Stable  Patient Active Problem List   Diagnosis Date Noted  . Colostomy in place [facility]) 06/02/2015  . S/P colostomy ([place]) 06/02/2015  . Subclavian steal syndrome   . Acute on chronic diastolic heart failure ([place])   . Ileus ([place])   . Hypoxia   . 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 Abdominal pain 09/29/2014  . CKD (chronic kidney disease) stage 3, GFR 30-59 ml/min 09/29/2014  . GERD (gastroesophageal reflux disease) 09/29/2014  . Abnormal cardiac function test 09/22/2014  . CAD S/P remote PCI (no details) 09/22/2014  . PVD- remote Lt SCA PTA (Dr Kellie Simmering) 09/22/2014  . COPD (chronic obstructive pulmonary disease) ([place]) 08/11/2014  . Essential hypertension 10/16/2013  . Cervical radiculopathy at C7 06/04/2013  . Dyslipidemia 12/13/2012  . Colovaginal fistula s/p colectomy/colostomy/repair 10/02/2014 11/15/2012  . Tobacco user 09/25/2012  . Kidney stone 09/25/2012  . NSTEMI (non-ST elevated myocardial infarction) ([place]) 08/07/2012    LOS: 7 days    Deniz Hannan A. 06/09/2015

## 2015-06-09 NOTE — Clinical Social Work Placement (Signed)
   CLINICAL SOCIAL WORK PLACEMENT  NOTE  Date:  06/09/2015  Patient Details  Name: Alexa Key MRN: [id number] Date of Birth: 1951-01-30  Clinical Social Work is seeking post-discharge placement for this patient at the [place] level of care (*CSW will initial, date and re-position this form in  chart as items are completed):  Yes   Patient/family provided with [facility] Work Department's list of facilities offering this level of care within the geographic area requested by the patient (or if unable, by the patient's family).  Yes   Patient/family informed of their freedom to choose among providers that offer the needed level of care, that participate in Medicare, Medicaid or managed care program needed by the patient, have an available bed and are willing to accept the patient.  Yes   Patient/family informed of [facility]'s ownership interest in [facility] and [facility], as well as of the fact that they are under no obligation to receive care at these facilities.  PASRR submitted to EDS on       PASRR number received on       Existing PASRR number confirmed on 06/05/15     FL2 transmitted to all facilities in geographic area requested by pt/family on 06/07/15     FL2 transmitted to all facilities within larger geographic area on 06/05/15     Patient informed that his/her managed care company has contracts with or will negotiate with certain facilities, including the following:        Yes   Patient/family informed of bed offers received.  Patient chooses bed at  [facility] )     Physician recommends and patient chooses bed at      Patient to be transferred to  [facility] ) on 06/09/15.  Patient to be transferred to facility by  [facility])     Patient family notified on 06/09/15 of transfer.  Name of family member notified:  Daugther Maie and patient is aware     PHYSICIAN Please prepare priority discharge  summary, including medications     Additional Comment:    _______________________________________________ Raymondo Band, LCSW 06/09/2015, 12:28 PM

## 2015-06-09 NOTE — Progress Notes (Signed)
CSW was informed by MD that patient is not medically stable and ready for discharge. CSW left voice message with Morey Hummingbird from [facility] at [phone].  CSW will continue to follow and provide support while in hospital.   Lucius Conn, [place] Worker [facility] Ph: [phone]

## 2015-06-10 ENCOUNTER — Inpatient Hospital Stay
Admission: RE | Admit: 2015-06-10 | Discharge: 2015-06-26 | Disposition: A | Discharge status: Home / Self Care | Payer: Commercial Managed Care - HMO | Source: Ambulatory Visit | Attending: Internal Medicine | Admitting: Internal Medicine

## 2015-06-10 DIAGNOSIS — R0989 Other specified symptoms and signs involving the circulatory and respiratory systems: Principal | ICD-10-CM

## 2015-06-10 LAB — C DIFFICILE QUICK SCREEN W PCR REFLEX
C DIFFICILE (CDIFF) TOXIN: NEGATIVE
C Diff antigen: POSITIVE — AB

## 2015-06-10 MED ORDER — BOOST / RESOURCE BREEZE PO LIQD: 1.0000 | Freq: Three times a day (TID) | ORAL | Status: DC

## 2015-06-10 MED ORDER — ALUM & MAG HYDROXIDE-SIMETH 200-200-20 MG/5ML PO SUSP: 30.0000 mL | Freq: Four times a day (QID) | ORAL | Status: DC | PRN

## 2015-06-10 MED ORDER — LOPERAMIDE HCL 2 MG PO CAPS: 2.0000 mg | ORAL_CAPSULE | Freq: Four times a day (QID) | ORAL | Status: DC | PRN

## 2015-06-10 MED ORDER — ENOXAPARIN SODIUM 40 MG/0.4ML ~~LOC~~ SOLN: 40.0000 mg | SUBCUTANEOUS | Status: DC

## 2015-06-10 MED ORDER — PRAVASTATIN SODIUM 80 MG PO TABS: 80.0000 mg | ORAL_TABLET | Freq: Every day | ORAL | Status: DC

## 2015-06-10 MED ORDER — ONDANSETRON HCL 4 MG PO TABS: 4.0000 mg | ORAL_TABLET | Freq: Four times a day (QID) | ORAL | Status: DC | PRN

## 2015-06-10 MED ORDER — ACETAMINOPHEN 325 MG PO TABS: 650.0000 mg | ORAL_TABLET | Freq: Four times a day (QID) | ORAL | Status: DC | PRN

## 2015-06-10 MED ORDER — METRONIDAZOLE 500 MG PO TABS
500.0000 mg | ORAL_TABLET | Freq: Three times a day (TID) | ORAL | Status: DC
  Administered 2015-06-10: 500 mg via ORAL
  Filled 2015-06-10: qty 1

## 2015-06-10 MED ORDER — METRONIDAZOLE 500 MG PO TABS: 500.0000 mg | ORAL_TABLET | Freq: Three times a day (TID) | ORAL | Status: DC

## 2015-06-10 MED ORDER — OXYCODONE-ACETAMINOPHEN 7.5-325 MG PO TABS: 2.0000 | ORAL_TABLET | ORAL | Status: DC | PRN

## 2015-06-10 MED ORDER — TRAMADOL HCL 50 MG PO TABS: 50.0000 mg | ORAL_TABLET | Freq: Four times a day (QID) | ORAL | Status: DC | PRN

## 2015-06-10 NOTE — Discharge Summary (Signed)
Physician Discharge Summary  Patient ID: Alexa Key MRN: [id number] DOB/AGE: [birth year] 65 y.o.  Admit date: 06/02/2015 Discharge date: 06/10/2015  Admission Diagnoses:colostomy in place Patient Active Problem List   Diagnosis Date Noted  . Colostomy in place [facility]) 06/02/2015  . S/P colostomy ([place]) 06/02/2015  . Subclavian steal syndrome   . Acute on chronic diastolic heart failure ([place])   . Ileus ([place])   . Hypoxia   . 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 Abdominal pain 09/29/2014  . CKD (chronic kidney disease) stage 3, GFR 30-59 ml/min 09/29/2014  . GERD (gastroesophageal reflux disease) 09/29/2014  . Abnormal cardiac function test 09/22/2014  . CAD S/P remote PCI (no details) 09/22/2014  . PVD- remote Lt [facility] PTA (Dr Kellie Simmering) 09/22/2014  . COPD (chronic obstructive pulmonary disease) ([place]) 08/11/2014  . Essential hypertension 10/16/2013  . Cervical radiculopathy at C7 06/04/2013  . Dyslipidemia 12/13/2012  . Colovaginal fistula s/p colectomy/colostomy/repair 10/02/2014 11/15/2012  . Tobacco user 09/25/2012  . Kidney stone 09/25/2012  . NSTEMI (non-ST elevated myocardial infarction) ([place]) 08/07/2012    Discharge Diagnoses: same  Active Problems:   Colostomy in place [facility])   S/P colostomy [facility])   Discharged Condition: good  Hospital Course: Pt had slow recovery after colostomy closure.  Her bowel function returned after 3 days and had loose stool. Entereg was stopped and C diff toxin negative bit antigen present.  Felt risk of colitis low but given frailty started flagyl 500 mg po tid.  Tolerating diet and cardiac issues stable.   Pt had wound vac and this was changed 3 times a week.   She was felt stable for DC POD 8 to SNIF.   Consults: cardiology  Significant Diagnostic Studies: se chart   Treatments: surgery: colostomy closure   Discharge Exam: Blood pressure 165/62, pulse 62, temperature 98.4 F (36.9 C),  temperature source Oral, resp. rate 19, height 5\' 1"  (1.549 m), weight 64.4 kg (141 lb 15.6 oz), SpO2 96 %. Resp: clear to auscultation bilaterally Cardio: regular rate and rhythm, S1, S2 normal, no murmur, click, rub or gallop Incision/Wound:vac in place soft obese  No guarding or rebound   Disposition: SNIF   Discharge Instructions    Diet - low sodium heart healthy    Complete by:  As directed      Increase activity slowly    Complete by:  As directed             Medication List    STOP taking these medications        polyethylene glycol packet  Commonly known as:  MIRALAX / GLYCOLAX     Vitamin D (Ergocalciferol) 50000 units Caps capsule  Commonly known as:  DRISDOL      TAKE these medications        acetaminophen 325 MG tablet  Commonly known as:  TYLENOL  Take 2 tablets (650 mg total) by mouth every 6 (six) hours as needed (fever > 101).     albuterol 108 (90 Base) MCG/ACT inhaler  Commonly known as:  PROVENTIL HFA;VENTOLIN HFA  Inhale 2 puffs into the lungs every 6 (six) hours as needed for wheezing or shortness of breath.     alum & mag hydroxide-simeth 200-200-20 MG/5ML suspension  Commonly known as:  MAALOX/MYLANTA  Take 30 mLs by mouth every 6 (six) hours as needed for indigestion (gas/bloating).     amLODipine 10 MG tablet  Commonly known as:  NORVASC  Take 1 tablet (  10 mg total) by mouth daily.     aspirin 325 MG tablet  Take 325 mg by mouth daily.     budesonide-formoterol 160-4.5 MCG/ACT inhaler  Commonly known as:  SYMBICORT  Inhale 2 puffs into the lungs 2 (two) times daily.     cholecalciferol 1000 units tablet  Commonly known as:  VITAMIN D  Take 2 tablets (2,000 Units total) by mouth at bedtime.     enoxaparin 40 MG/0.4ML injection  Commonly known as:  LOVENOX  Inject 0.4 mLs (40 mg total) into the skin daily.     feeding supplement Liqd  Take 1 Container by mouth 3 (three) times daily between meals.     isosorbide mononitrate 30 MG  24 hr tablet  Commonly known as:  IMDUR  Take 1 tablet (30 mg total) by mouth daily.     loperamide 2 MG capsule  Commonly known as:  IMODIUM  Take 1 capsule (2 mg total) by mouth every 6 (six) hours as needed for diarrhea or loose stools.     metoprolol tartrate 25 MG tablet  Commonly known as:  LOPRESSOR  Take 0.5 tablets (12.5 mg total) by mouth 2 (two) times daily.     metroNIDAZOLE 500 MG tablet  Commonly known as:  FLAGYL  Take 1 tablet (500 mg total) by mouth every 8 (eight) hours.     omeprazole 20 MG capsule  Commonly known as:  PRILOSEC  TAKE 1 CAPSULE (20 MG TOTAL)  BY MOUTH DAILY.     ondansetron 4 MG tablet  Commonly known as:  ZOFRAN  Take 1 tablet (4 mg total) by mouth every 6 (six) hours as needed for nausea.     oxyCODONE-acetaminophen 7.5-325 MG tablet  Commonly known as:  PERCOCET  Take 2 tablets by mouth every 4 (four) hours as needed for moderate pain.     pravastatin 80 MG tablet  Commonly known as:  PRAVACHOL  Take 1 tablet (80 mg total) by mouth at bedtime.     pravastatin 80 MG tablet  Commonly known as:  PRAVACHOL  Take 1 tablet (80 mg total) by mouth at bedtime.     tiotropium 18 MCG inhalation capsule  Commonly known as:  SPIRIVA  Place 1 capsule (18 mcg total) into inhaler and inhale daily.     tiZANidine 2 MG tablet  Commonly known as:  ZANAFLEX  Take 1 tablet (2 mg total) by mouth every 6 (six) hours as needed.     traMADol 50 MG tablet  Commonly known as:  ULTRAM  Take 1-2 tablets (50-100 mg total) by mouth every 6 (six) hours as needed for moderate pain.           Follow-up Information    Follow up with Bellamie Turney A., MD In 2 weeks.   Specialty:  General Surgery   Contact information:   [address] [place] [address] [phone]       Signed: Turner Daniels. 06/10/2015, 7:34 AM

## 2015-06-10 NOTE — Progress Notes (Signed)
8 Days Post-Op  Subjective: Pt with loose stool c diff sent toxin negative antigen positive Denies abdominal pain  Eating better   Objective: Vital signs in last 24 hours: Temp:  [98.2 F (36.8 C)-99.1 F (37.3 C)] 98.4 F (36.9 C) (02/08 0516) Pulse Rate:  [60-69] 62 (02/08 0516) Resp:  [18-19] 19 (02/08 0516) BP: (154-181)/(58-62) 165/62 mmHg (02/08 0516) SpO2:  [94 %-100 %] 96 % (02/08 0516) Weight:  [64.4 kg (141 lb 15.6 oz)] 64.4 kg (141 lb 15.6 oz) (02/08 0516) Last BM Date: 06/09/15  Intake/Output from previous day: 02/07 0701 - 02/08 0700 In: 1370.8 [P.O.:490; I.V.:880.8] Out: 0  Intake/Output this shift:    Incision/Wound:vac in place  Soft  No rebound or guarding   Lab Results:  No results for input(s): WBC, HGB, HCT, PLT in the last 72 hours. BMET  Recent Labs  06/08/15 1622 06/09/15 0506  NA 138  --   K 3.4*  --   CL 102  --   CO2 25  --   GLUCOSE 126*  --   BUN 8  --   CREATININE 1.24* 1.25*  CALCIUM 8.3*  --    PT/INR No results for input(s): LABPROT, INR in the last 72 hours. ABG No results for input(s): PHART, HCO3 in the last 72 hours.  Invalid input(s): PCO2, PO2  Studies/Results: No results found.  Anti-infectives: Anti-infectives    Start     Dose/Rate Route Frequency Ordered Stop   06/02/15 0712  clindamycin (CLEOCIN) 900 MG/50ML IVPB    Comments:  Ara Kussmaul   : cabinet override      06/02/15 0712 06/02/15 0747   06/02/15 0700  clindamycin (CLEOCIN) IVPB 900 mg     900 mg 100 mL/hr over 30 Minutes Intravenous 60 min pre-op 06/02/15 0700     06/02/15 0700  gentamicin (GARAMYCIN) 300 mg in dextrose 5 % 100 mL IVPB     5 mg/kg  60.8 kg 107.5 mL/hr over 60 Minutes Intravenous 60 min pre-op 06/02/15 0700 06/02/15 0841      Assessment/Plan: s/p Procedure(s): COLOSTOMY CLOSURE (N/A) PARTIAL COLECTOMY (N/A) C diff toxin negative.  Positive antigen indicates presence of bacteria but this is not uncommon. Stopped  entereg Otherwise tolerating her diet Will add flagyl 500 mg by mouth tid  Can be placed when bed ready hopefully Patient Active Problem List   Diagnosis Date Noted  . Colostomy in place [facility]) 06/02/2015  . S/P colostomy ([place]) 06/02/2015  . Subclavian steal syndrome   . Acute on chronic diastolic heart failure ([place])   . Ileus ([place])   . Hypoxia   . 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 Abdominal pain 09/29/2014  . CKD (chronic kidney disease) stage 3, GFR 30-59 ml/min 09/29/2014  . GERD (gastroesophageal reflux disease) 09/29/2014  . Abnormal cardiac function test 09/22/2014  . CAD S/P remote PCI (no details) 09/22/2014  . PVD- remote Lt [facility] PTA (Dr Kellie Simmering) 09/22/2014  . COPD (chronic obstructive pulmonary disease) ([place]) 08/11/2014  . Essential hypertension 10/16/2013  . Cervical radiculopathy at C7 06/04/2013  . Dyslipidemia 12/13/2012  . Colovaginal fistula s/p colectomy/colostomy/repair 10/02/2014 11/15/2012  . Tobacco user 09/25/2012  . Kidney stone 09/25/2012  . NSTEMI (non-ST elevated myocardial infarction) ([place]) 08/07/2012    LOS: 8 days    Filomena Pokorney A. 06/10/2015

## 2015-06-10 NOTE — Progress Notes (Signed)
Report called to [facility] at [facility]

## 2015-06-10 NOTE — Consult Note (Addendum)
WOC wound follow up Wound type: surgical full thickness to midline abd Wound bed: 100% beefy red to all sites Drainage (amount, consistency, odor) minimal amt tan drainage in VAC canister Periwound: intact  Dressing procedure/placement/frequency: Protected skin between takedown site and between open abdominal wounds with VAC drape. 1pc of black foam used to fill proximal end of midline surgical wound, 1pc of black foam used to fill distal end of open midline surgical wound, 1pc of black foam used to fill take down site. 1pc of black foam used as bridge for all sites.  Patient tolerated well, however she is quite anxious and required someone to hold her hand during dressing. Pain meds given prior to procedure but process was still painful. Cont suction at 137mmHG. Pt plans to discharge today to SNF and will continue dressing changes Q M/W/F Julien Girt MSN, RN, [place], [place], [place]

## 2015-06-10 NOTE — Discharge Instructions (Signed)
SURGERY: POST OP INSTRUCTIONS °(Surgery for small bowel obstruction, colon resection, etc)  ° ° °DIET °Follow a light diet the first few days at home.  Start with a bland diet such as soups, liquids, starchy foods, low fat foods, etc.  If you feel full, bloated, or constipated, stay on a ful liquid or pureed/blenderized diet for a few days until you feel better and no longer constipated. °Be sure to drink plenty of fluids every day to avoid getting dehydrated (feeling dizzy, not urinating, etc.). °Gradually add a fiber supplement to your diet over the next week.  Gradually get back to a regular solid diet.  Avoid fast food or heavy meals the first week as you are more likely to get nauseated. °It is expected for your digestive tract to need a few months to get back to normal.  It is common for your bowel movements and stools to be irregular.  You will have occasional bloating and cramping that should eventually fade away.  Until you are eating solid food normally, off all pain medications, and back to regular activities; your bowels will not be normal. °Focus on eating a low-fat, high fiber diet the rest of your life (See Getting to Good Bowel Health, below). ° °CARE of your INCISION or WOUND °It is good for closed incision and even open wounds to be washed every day.  Shower every day.  Short baths are fine.  Wash the incisions and wounds clean with soap & water.    °If you have a closed incision(s), wash the incision with soap & water every day.  You may leave closed incisions open to air if it is dry.   You may cover the incision with clean gauze & replace it after your daily shower for comfort. °If you have skin tapes (Steristrips) or skin glue (Dermabond) on your incision, leave them in place.  They will fall off on their own like a scab.  You may trim any edges that curl up with clean scissors.  If you have staples, set up an appointment for them to be removed in the office in 10 days after surgery.  °If you  have a drain, wash around the skin exit site with soap & water and place a new dressing of gauze or band aid around the skin every day.  Keep the drain site clean & dry.    °If you have an open wound with packing, see wound care instructions.  In general, it is encouraged that you remove your dressing and packing, shower with soap & water, and replace your dressing once a day.  Pack the wound with clean gauze moistened with normal (0.9%) saline to keep the wound moist & uninfected.  Pressure on the dressing for 30 minutes will stop most wound bleeding.  Eventually your body will heal & pull the open wound closed over the next few months.  °Raw open wounds will occasionally bleed or secrete yellow drainage until it heals closed.  Drain sites will drain a little until the drain is removed.  Even closed incisions can have mild bleeding or drainage the first few days until the skin edges scab over & seal.   °If you have an open wound with a wound vac, see wound vac care instructions. ° ° ° ° °ACTIVITIES as tolerated °Start light daily activities --- self-care, walking, climbing stairs-- beginning the day after surgery.  Gradually increase activities as tolerated.  Control your pain to be active.  Stop when you   are tired.  Ideally, walk several times a day, eventually an hour a day.   Most people are back to most day-to-day activities in a few weeks.  It takes 4-8 weeks to get back to unrestricted, intense activity. If you can walk 30 minutes without difficulty, it is safe to try more intense activity such as jogging, treadmill, bicycling, low-impact aerobics, swimming, etc. Save the most intensive and strenuous activity for last (Usually 4-8 weeks after surgery) such as sit-ups, heavy lifting, contact sports, etc.  Refrain from any intense heavy lifting or straining until you are off narcotics for pain control.  You will have off days, but things should improve week-by-week. DO NOT PUSH THROUGH PAIN.  Let pain be  your guide: If it hurts to do something, don't do it.  Pain is your body warning you to avoid that activity for another week until the pain goes down. You may drive when you are no longer taking narcotic prescription pain medication, you can comfortably wear a seatbelt, and you can safely make sudden turns/stops to protect yourself without hesitating due to pain. You may have sexual intercourse when it is comfortable. If it hurts to do something, stop.  MEDICATIONS Take your usually prescribed home medications unless otherwise directed.   Blood thinners:  Usually you can restart any strong blood thinners after the second postoperative day.  It is OK to take aspirin right away.     If you are on strong blood thinners (warfarin/Coumadin, Plavix, Xerelto, Eliquis, Pradaxa, etc), discuss with your surgeon, medicine PCP, and/or cardiologist for instructions on when to restart the blood thinner & if blood monitoring is needed (PT/INR blood check, etc).     PAIN CONTROL Pain after surgery or related to activity is often due to strain/injury to muscle, tendon, nerves and/or incisions.  This pain is usually short-term and will improve in a few months.  To help speed the process of healing and to get back to regular activity more quickly, DO THE FOLLOWING THINGS TOGETHER: 1. Increase activity gradually.  DO NOT PUSH THROUGH PAIN 2. Use Ice and/or Heat 3. Try Gentle Massage and/or Stretching 4. Take over the counter pain medication 5. Take Narcotic prescription pain medication for more severe pain  Good pain control = faster recovery.  It is better to take more medicine to be more active than to stay in bed all day to avoid medications. 1.  Increase activity gradually Avoid heavy lifting at first, then increase to lifting as tolerated over the next 6 weeks. Do not push through the pain.  Listen to your body and avoid positions and maneuvers than reproduce the pain.  Wait a few days before trying  something more intense Walking an hour a day is encouraged to help your body recover faster and more safely.  Start slowly and stop when getting sore.  If you can walk 30 minutes without stopping or pain, you can try more intense activity (running, jogging, aerobics, cycling, swimming, treadmill, sex, sports, weightlifting, etc.) Remember: If it hurts to do it, then dont do it! 2. Use Ice and/or Heat You will have swelling and bruising around the incisions.  This will take several weeks to resolve. Ice packs or heating pads (6-8 times a day, 30-60 minutes at a time) will help sooth soreness & bruising. Some people prefer to use ice alone, heat alone, or alternate between ice & heat.  Experiment and see what works best for you.  Consider trying ice for the first  few days to help decrease swelling and bruising; then, switch to heat to help relax sore spots and speed recovery. Shower every day.  Short baths are fine.  It feels good!  Keep the incisions and wounds clean with soap & water.   3. Try Gentle Massage and/or Stretching Massage at the area of pain many times a day Stop if you feel pain - do not overdo it 4. Take over the counter pain medication This helps the muscle and nerve tissues become less irritable and calm down faster Choose ONE of the following over-the-counter anti-inflammatory medications: Acetaminophen '500mg'$  tabs (Tylenol) 1-2 pills with every meal and just before bedtime (avoid if you have liver problems or if you have acetaminophen in you narcotic prescription) Naproxen '220mg'$  tabs (ex. Aleve, Naprosyn) 1-2 pills twice a day (avoid if you have kidney, stomach, IBD, or bleeding problems) Ibuprofen '200mg'$  tabs (ex. Advil, Motrin) 3-4 pills with every meal and just before bedtime (avoid if you have kidney, stomach, IBD, or bleeding problems) Take with food/snack several times a day as directed for at least 2 weeks to help keep pain / soreness down & more manageable. 5. Take Narcotic  prescription pain medication for more severe pain A prescription for strong pain control is often given to you upon discharge (for example: oxycodone/Percocet, hydrocodone/Norco/Vicodin, or tramadol/Ultram) Take your pain medication as prescribed. Be mindful that most narcotic prescriptions contain Tylenol (acetaminophen) as well - avoid taking too much Tylenol. If you are having problems/concerns with the prescription medicine (does not control pain, nausea, vomiting, rash, itching, etc.), please call us [phone] to see if we need to switch you to a different pain medicine that will work better for you and/or control your side effects better. If you need a refill on your pain medication, you must call the office before 4 pm and on weekdays only.  By federal law, prescriptions for narcotics cannot be called into a pharmacy.  They must be filled out on paper & picked up from our office by the patient or authorized caretaker.  Prescriptions cannot be filled after 4 pm nor on weekends.   WHEN TO CALL us [phone] Severe uncontrolled or worsening pain  Fever over 101 F (38.5 C) Concerns with the incision: Worsening pain, redness, rash/hives, swelling, bleeding, or drainage Reactions / problems with new medications (itching, rash, hives, nausea, etc.) Nausea and/or vomiting Difficulty urinating Difficulty breathing Worsening fatigue, dizziness, lightheadedness, blurred vision Other concerns If you are not getting better after two weeks or are noticing you are getting worse, contact our office [phone] for further advice.  We may need to adjust your medications, re-evaluate you in the office, send you to the emergency room, or see what other things we can do to help. The clinic staff is available to answer your questions during regular business hours (8:30am-5pm).  Please dont hesitate to call and ask to speak to one of our nurses for clinical concerns.    A surgeon from [facility] is always on call at the hospitals 24 hours/day If you have a medical emergency, go to the nearest emergency room or call 911. FOLLOW UP in our office One the day of your discharge from the hospital (or the next business weekday), please call [facility] to set up or confirm an appointment to see your surgeon in the office for a follow-up appointment.  Usually it is 2-3 weeks after your surgery.   If you have skin staples at  your incision(s), let the office know so we can set up a time in the office for the nurse to remove them (usually around 10 days after surgery). Make sure that you call for appointments the day of discharge (or the next business weekday) from the hospital to ensure a convenient appointment time. IF YOU HAVE DISABILITY OR FAMILY LEAVE FORMS, BRING THEM TO THE OFFICE FOR PROCESSING.  DO NOT GIVE THEM TO YOUR DOCTOR.  [facility] [address] [place], [place], [place]  [address] ? [phone] - Main [phone] - [place],  [phone] - Fax [email or web address]  GETTING TO GOOD BOWEL HEALTH. It is expected for your digestive tract to need a few months to get back to normal.  It is common for your bowel movements and stools to be irregular.  You will have occasional bloating and cramping that should eventually fade away.  Until you are eating solid food normally, off all pain medications, and back to regular activities; your bowels will not be normal.   Avoiding constipation The goal: ONE SOFT BOWEL MOVEMENT A DAY!    Drink plenty of fluids.  Choose water first. TAKE A FIBER SUPPLEMENT EVERY DAY THE REST OF YOUR LIFE During your first week back home, gradually add back a fiber supplement every day Experiment which form you can tolerate.   There are many forms such as powders, tablets, wafers, gummies, etc Psyllium bran (Metamucil), methylcellulose (Citrucel), Miralax or Glycolax, Benefiber, Flax Seed.    Adjust the dose week-by-week (1/2 dose/day to 6 doses a day) until you are moving your bowels 1-2 times a day.  Cut back the dose or try a different fiber product if it is giving you problems such as diarrhea or bloating. Sometimes a laxative is needed to help jump-start bowels if constipated until the fiber supplement can help regulate your bowels.  If you are tolerating eating & you are farting, it is okay to try a gentle laxative such as double dose MiraLax, prune juice, or Milk of Magnesia.  Avoid using laxatives too often. Stool softeners can sometimes help counteract the constipating effects of narcotic pain medicines.  It can also cause diarrhea, so avoid using for too long. If you are still constipated despite taking fiber daily, eating solids, and a few doses of laxatives, call our office. Controlling diarrhea Try drinking liquids and eating bland foods for a few days to avoid stressing your intestines further. Avoid dairy products (especially milk & ice cream) for a short time.  The intestines often can lose the ability to digest lactose when stressed. Avoid foods that cause gassiness or bloating.  Typical foods include beans and other legumes, cabbage, broccoli, and dairy foods.  Avoid greasy, spicy, fast foods.  Every person has some sensitivity to other foods, so listen to your body and avoid those foods that trigger problems for you. Probiotics (such as active yogurt, Align, etc) may help repopulate the intestines and colon with normal bacteria and calm down a sensitive digestive tract Adding a fiber supplement gradually can help thicken stools by absorbing excess fluid and retrain the intestines to act more normally.  Slowly increase the dose over a few weeks.  Too much fiber too soon can backfire and cause cramping & bloating. It is okay to try and slow down diarrhea with a few doses of antidiarrheal medicines.   Bismuth subsalicylate (ex. Kayopectate, Pepto Bismol) for a few doses can  help control diarrhea.  Avoid if pregnant.  Loperamide (Imodium) can slow down diarrhea.  Start with one tablet (2mg ) first.  Avoid if you are having fevers or severe pain.  ILEOSTOMY PATIENTS WILL HAVE CHRONIC DIARRHEA since their colon is not in use.    Drink plenty of liquids.  You will need to drink even more glasses of water/liquid a day to avoid getting dehydrated. Record output from your ileostomy.  Expect to empty the bag every 3-4 hours at first.  Most people with a permanent ileostomy empty their bag 4-6 times at the least.   Use antidiarrheal medicine (especially Imodium) several times a day to avoid getting dehydrated.  Start with a dose at bedtime & breakfast.  Adjust up or down as needed.  Increase antidiarrheal medications as directed to avoid emptying the bag more than 8 times a day (every 3 hours). Work with your wound ostomy nurse to learn care for your ostomy.  See ostomy care instructions. TROUBLESHOOTING IRREGULAR BOWELS 1) Start with a soft & bland diet. No spicy, greasy, or fried foods.  2) Avoid gluten/wheat or dairy products from diet to see if symptoms improve. 3) Miralax 17gm or flax seed mixed in [place]. water or juice-daily. May use 2-4 times a day as needed. 4) Gas-X, Phazyme, etc. as needed for gas & bloating.  5) Prilosec (omeprazole) over-the-counter as needed 6)  Consider probiotics (Align, Activa, etc) to help calm the bowels down  Call your doctor if you are getting worse or not getting better.  Sometimes further testing (cultures, endoscopy, X-ray studies, CT scans, bloodwork, etc.) may be needed to help diagnose and treat the cause of the diarrhea. [facility], [place], [place], [place], [place]  [address] [phone] - Main.    [phone]  - Toll Free.   [phone] - Fax [email or web address]

## 2015-06-10 NOTE — Clinical Social Work Placement (Addendum)
   CLINICAL SOCIAL WORK PLACEMENT  NOTE 06/10/15 - DISCHARGED TO Cut Off  Date:  06/10/2015  Patient Details  Name: Alexa Key MRN: [id number] Date of Birth: 1950/06/13  Clinical Social Work is seeking post-discharge placement for this patient at the [place] level of care (*CSW will initial, date and re-position this form in  chart as items are completed):  Yes   Patient/family provided with [place] Work Department's list of facilities offering this level of care within the geographic area requested by the patient (or if unable, by the patient's family).  Yes   Patient/family informed of their freedom to choose among providers that offer the needed level of care, that participate in Medicare, Medicaid or managed care program needed by the patient, have an available bed and are willing to accept the patient.  Yes   Patient/family informed of [facility]'s ownership interest in [facility] and [facility], as well as of the fact that they are under no obligation to receive care at these facilities.  PASRR submitted to EDS on       PASRR number received on       Existing PASRR number confirmed on 06/05/15     FL2 transmitted to all facilities in geographic area requested by pt/family on 06/07/15     FL2 transmitted to all facilities within larger geographic area on 06/05/15     Patient informed that his/her managed care company has contracts with or will negotiate with certain facilities, including the following:        Yes   Patient/family informed of bed offers received.  Patient chooses bed at  [facility] )     Physician recommends and patient chooses bed at      Patient to be transferred to  [facility] ) on 06/10/15  Patient to be transferred to facility by  [facility])     Patient family notified on 06/10/15 of transfer.  Name of family member notified:  Daugther [name] and patient is aware 2/7 and 06/10/15.      PHYSICIAN Please prepare priority discharge summary, including medications     Additional Comment:  06/10/15 [facility] authorization [id number] eff. 06/10/15 for 7 days.   _______________________________________________ Sable Feil, LCSW 06/10/2015, 12:58 PM

## 2015-06-11 ENCOUNTER — Other Ambulatory Visit: Payer: Self-pay | Admitting: *Deleted

## 2015-06-11 ENCOUNTER — Encounter (HOSPITAL_COMMUNITY)
Admission: RE | Admit: 2015-06-11 | Discharge: 2015-06-11 | Disposition: A | Discharge status: Home / Self Care | Payer: Commercial Managed Care - HMO | Source: Skilled Nursing Facility | Attending: Internal Medicine | Admitting: Internal Medicine

## 2015-06-11 DIAGNOSIS — E785 Hyperlipidemia, unspecified: Secondary | ICD-10-CM | POA: Insufficient documentation

## 2015-06-11 DIAGNOSIS — I5022 Chronic systolic (congestive) heart failure: Secondary | ICD-10-CM | POA: Insufficient documentation

## 2015-06-11 DIAGNOSIS — I10 Essential (primary) hypertension: Secondary | ICD-10-CM | POA: Insufficient documentation

## 2015-06-11 DIAGNOSIS — Z48810 Encounter for surgical aftercare following surgery on the sense organs: Secondary | ICD-10-CM | POA: Insufficient documentation

## 2015-06-11 DIAGNOSIS — Z48815 Encounter for surgical aftercare following surgery on the digestive system: Secondary | ICD-10-CM | POA: Insufficient documentation

## 2015-06-11 DIAGNOSIS — Z978 Presence of other specified devices: Secondary | ICD-10-CM | POA: Insufficient documentation

## 2015-06-11 DIAGNOSIS — N183 Chronic kidney disease, stage 3 (moderate): Secondary | ICD-10-CM | POA: Insufficient documentation

## 2015-06-11 LAB — CBC
HCT: 32.1 % — ABNORMAL LOW (ref 36.0–46.0)
Hemoglobin: 10.2 g/dL — ABNORMAL LOW (ref 12.0–15.0)
MCH: 31.2 pg (ref 26.0–34.0)
MCHC: 31.8 g/dL (ref 30.0–36.0)
MCV: 98.2 fL (ref 78.0–100.0)
PLATELETS: 248 10*3/uL (ref 150–400)
RBC: 3.27 MIL/uL — AB (ref 3.87–5.11)
RDW: 13.7 % (ref 11.5–15.5)
WBC: 12.3 10*3/uL — AB (ref 4.0–10.5)

## 2015-06-11 MED ORDER — OXYCODONE-ACETAMINOPHEN 7.5-325 MG PO TABS: 2.0000 | ORAL_TABLET | ORAL | Status: DC | PRN

## 2015-06-11 MED ORDER — TRAMADOL HCL 50 MG PO TABS: 50.0000 mg | ORAL_TABLET | Freq: Four times a day (QID) | ORAL | Status: DC | PRN

## 2015-06-12 ENCOUNTER — Encounter (HOSPITAL_COMMUNITY)
Admission: RE | Admit: 2015-06-12 | Discharge: 2015-06-12 | Disposition: A | Discharge status: Home / Self Care | Payer: Commercial Managed Care - HMO | Source: Skilled Nursing Facility | Attending: Internal Medicine | Admitting: Internal Medicine

## 2015-06-12 ENCOUNTER — Non-Acute Institutional Stay: Payer: Commercial Managed Care - HMO | Admitting: Internal Medicine

## 2015-06-12 DIAGNOSIS — N183 Chronic kidney disease, stage 3 unspecified: Secondary | ICD-10-CM

## 2015-06-12 DIAGNOSIS — I5030 Unspecified diastolic (congestive) heart failure: Secondary | ICD-10-CM

## 2015-06-12 DIAGNOSIS — J42 Unspecified chronic bronchitis: Secondary | ICD-10-CM

## 2015-06-12 DIAGNOSIS — Z933 Colostomy status: Secondary | ICD-10-CM | POA: Diagnosis not present

## 2015-06-12 LAB — COMPREHENSIVE METABOLIC PANEL
ALBUMIN: 2.7 g/dL — AB (ref 3.5–5.0)
ALT: 9 U/L — ABNORMAL LOW (ref 14–54)
ANION GAP: 10 (ref 5–15)
AST: 14 U/L — AB (ref 15–41)
Alkaline Phosphatase: 57 U/L (ref 38–126)
BILIRUBIN TOTAL: 0.4 mg/dL (ref 0.3–1.2)
BUN: 12 mg/dL (ref 6–20)
CHLORIDE: 107 mmol/L (ref 101–111)
CO2: 27 mmol/L (ref 22–32)
Calcium: 8.4 mg/dL — ABNORMAL LOW (ref 8.9–10.3)
Creatinine, Ser: 1.42 mg/dL — ABNORMAL HIGH (ref 0.44–1.00)
GFR calc Af Amer: 44 mL/min — ABNORMAL LOW (ref >60)
GFR calc non Af Amer: 38 mL/min — ABNORMAL LOW (ref >60)
GLUCOSE: 164 mg/dL — AB (ref 65–99)
POTASSIUM: 3.7 mmol/L (ref 3.5–5.1)
SODIUM: 144 mmol/L (ref 135–145)
TOTAL PROTEIN: 5.9 g/dL — AB (ref 6.5–8.1)

## 2015-06-12 NOTE — Progress Notes (Signed)
Patient ID: Alexa Key, female   DOB: [birth year], 65 y.o.   MRN: [id number]    This is an acute visit.  Level care skilled.  Facility [facility].  Chief complaint acute visit status post hospitalization for closure of colostomy.  History of present illness.  Patient is a pleasant 65 year old female who is status post colostomy-and recently underwent colostomy closure-she had a slow recovery after this.  Her bowel function did return apparently after 3 days and continue with loose stools.  C. difficile toxin was negative but Adagen was present felt risk of colitis low but given her frailty she was started on Flagyl 500 mg 3 times a day.  She spent tolerating her diet.  She has been started on a wound VAC and this is being followed by wound care.  She's been discharged to skilled nursing for rehabilitation and strengthening as well as monitoring of her wound.  Previous medical history is.  History of colostomy.  Subclavian steal syndrome.  Acute on chronic diastolic CHF.  History of ileus.  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  Chronic kidney disease stage III.  GERD.  Coronary artery disease status post PCI.  Peripheral vascular disease.  She'll be.  Hypertension.  C history of cervical radiculopathy as C7.  Dyslipidemia.  History of tobacco use.  History of kidney stones.  History of non-ST MI.  Medical social history reviewed per discharge summary on February 2017.  Medications.  Tylenol 650 g every 6 hours when necessary.  Albuterol nebulizers every 6 hours when necessary.  Maalox every 6 hours when necessary.  Norvasc 10 mg daily.  Aspirin 325 mg daily.  Symbicort 2 puffs in the lungs twice a day.  Vitamin D 2000 units daily at bedtime.  Lovenox 40 mg daily.  Imdur 30 mg daily.  Imodium 2 mg every 6 hours when necessary.  Lopressor 12.5 mg twice a day.  Flagyl 5 mg every  8 hours.  Prilosec 20 mg daily.  Zofran 4 mg every 6 hours when necessary nausea.  Percocet 7. 09/02/2023 milligrams every 4 hours when necessary.  Pravastatin 80 mg daily.  Spiriva 18 g daily.  Zanaflex 2 mg every 6 hours when necessary tramadol 50 mg every 6 hours when necessary.  Review of systems.  In general she is not complaining of any fever or chills.  Skin is not complaining of rashes or itching does have lab wound that appears stable without sign of infection.  No rashes no itching.  Head ears eyes nose mouth and throat does not complain of visual changes or sore throat.  Respiratory is not complaining of shortness breath or cough.  Cardiac no chest pain or significant lower extremity edema.  GI significant history as noted above but is not complaining of abdominal discomfort says her appetite is slowly improving although still not great does not complain of nausea or vomiting.  GU does not complain of dysuria.  Muscle skeletal has weakness but does not complaining of joint pain.  Neurologic is not complaining of dizziness headache or syncopal-type feelings.  In psych is not complaining of depression or anxiety peers to be in good spirits.  Physical exam.  Temperature is 97.8 pulse 63 respirations 18 blood pressure 120/57.  In general this is a pleasant elderly female in no distress she appears to be good spirits.  Her skin is warm and dry there is a mid abdominal wound this appears to be clean without sign  of infection I do not see any discharge or experience any odorp-p tissue does appear to be pink moist-.  Eyes pupils appear reactive light sclera and conjunctiva clear visual acuity appears grossly intact.  Oropharynx is clear mucous membranes moist.  Chest is clear to auscultation there is no labored breathing.  Heart is regular rate and rhythm with occasional irregular beats-she has somewhat decreased pedal pulses there is no lower extremity  edema.  Abdomen soft nontender again she does have the open wound area as noted above.--Bowel sounds are positive  Muscular skeletal.--Limited exam since she is in bed but appears to move all extremities at baseline strength appears to be intact I did not note any deformities.  Neurologic is grossly intact no lateralizing findings her speech is clear.  Psych she is alert and or anus pleasant and appropriate.  Labs.  06/11/2015.  WBC 12.3 hemoglobin 10.2 platelets 248.  C. difficile antigen positive toxin was negative.  [facility] 2016.  Sodium 144 potassium 3.7 BUN 12 creatinine 1.4 to.  Albumin 2.7 AST is 14 ALT is 19.  I know previous creatinines have been in the 1.25 range.  Assessment plan.  History of colostomy closure-she appears to have tolerated the procedure well but apparently has similar general weakness and a slow recovery she is here for rehabilitation as well as wound care-she does have a wound VAC apparently she is refusing this at this time this has been discussed extensively with the wound care nurse and will monitor this will have to be encouraged and wound carenurse did express to her that her wound would probably heal faster with the vac and patient is aware of this She is receiving Percocet as needed for pain as well as tramadol if needed this appears to be effective this time.  Of note she does continue on Flagyl as noted above she does have a C. difficile toxin is negative but Adagen is present with her frailty it was thought best to start her empirically on Flagyl.  #2 history of acute on chronic diastolic CHF at this point appears stable I do not really note any significant edema or shortness of breath she is not on any diuretic currently she does continue on Imdur at this point will monitor.--She also continues on a beta blocker  #3 history of coronary artery disease-she is on a stain--aspirin as well as Imdur-. Aspirin as well as Imdur  #4 history of  chronic kidney disease creatinine most recently 1.4 to appears recent creatinines have been in the 1.2-1.4 range approximately fluids will have to be encouraged and will update here early next week.  #5 history of GERD she is on Prilosec this appears stable at this point.  #6 history COPD she is on numerous medications including albuterol inhaler when necessary-Symbicort twice a day as well as Spiriva.  At this point appears to be stable.  Hypertension-she continues again on Norvasc Lopressor and Imdur at this point will monitor recent blood pressure 128/57.  Clinically patient appears to be stable again her wound VAC status will have to be discussed further-wound itself looks to be stable-she will be receiving a topical treatment per wound care and this will have to be watched closely.  Of note Will update a CBC as well as early next week hemoglobin 10.2 appears to be stable on lab done yesterday.  [id number] note greater than 35 minutes spent assessing patient-reviewing her chart-and coordinating and formulating a plan of care for numerous diagnoses-of note greater than  50% of time spent coordinating plan of care

## 2015-06-14 ENCOUNTER — Non-Acute Institutional Stay: Payer: Commercial Managed Care - HMO | Admitting: Internal Medicine

## 2015-06-14 ENCOUNTER — Encounter: Payer: Self-pay | Admitting: Internal Medicine

## 2015-06-14 DIAGNOSIS — N183 Chronic kidney disease, stage 3 unspecified: Secondary | ICD-10-CM

## 2015-06-14 DIAGNOSIS — K913 Postprocedural intestinal obstruction: Secondary | ICD-10-CM

## 2015-06-14 DIAGNOSIS — T8189XA Other complications of procedures, not elsewhere classified, initial encounter: Secondary | ICD-10-CM

## 2015-06-14 DIAGNOSIS — K9189 Other postprocedural complications and disorders of digestive system: Secondary | ICD-10-CM

## 2015-06-14 DIAGNOSIS — J42 Unspecified chronic bronchitis: Secondary | ICD-10-CM | POA: Diagnosis not present

## 2015-06-14 DIAGNOSIS — K567 Ileus, unspecified: Secondary | ICD-10-CM

## 2015-06-15 ENCOUNTER — Encounter (HOSPITAL_COMMUNITY)
Admission: RE | Admit: 2015-06-15 | Discharge: 2015-06-15 | Disposition: A | Discharge status: Home / Self Care | Payer: Commercial Managed Care - HMO | Source: Skilled Nursing Facility | Attending: Internal Medicine | Admitting: Internal Medicine

## 2015-06-15 LAB — BASIC METABOLIC PANEL
Anion gap: 6 (ref 5–15)
BUN: 14 mg/dL (ref 6–20)
CALCIUM: 8.1 mg/dL — AB (ref 8.9–10.3)
CO2: 28 mmol/L (ref 22–32)
CREATININE: 1.61 mg/dL — AB (ref 0.44–1.00)
Chloride: 110 mmol/L (ref 101–111)
GFR calc Af Amer: 37 mL/min — ABNORMAL LOW (ref >60)
GFR, EST NON AFRICAN AMERICAN: 32 mL/min — AB (ref >60)
GLUCOSE: 96 mg/dL (ref 65–99)
Potassium: 4.3 mmol/L (ref 3.5–5.1)
SODIUM: 144 mmol/L (ref 135–145)

## 2015-06-15 LAB — CBC WITH DIFFERENTIAL/PLATELET
BASOS ABS: 0 10*3/uL (ref 0.0–0.1)
BASOS PCT: 0 %
EOS ABS: 0.2 10*3/uL (ref 0.0–0.7)
EOS PCT: 2 %
HCT: 28.1 % — ABNORMAL LOW (ref 36.0–46.0)
Hemoglobin: 9 g/dL — ABNORMAL LOW (ref 12.0–15.0)
LYMPHS PCT: 22 %
Lymphs Abs: 2.4 10*3/uL (ref 0.7–4.0)
MCH: 32.1 pg (ref 26.0–34.0)
MCHC: 32 g/dL (ref 30.0–36.0)
MCV: 100.4 fL — AB (ref 78.0–100.0)
MONO ABS: 0.9 10*3/uL (ref 0.1–1.0)
Monocytes Relative: 8 %
Neutro Abs: 7.4 10*3/uL (ref 1.7–7.7)
Neutrophils Relative %: 68 %
PLATELETS: 290 10*3/uL (ref 150–400)
RBC: 2.8 MIL/uL — AB (ref 3.87–5.11)
RDW: 13.9 % (ref 11.5–15.5)
WBC: 11 10*3/uL — AB (ref 4.0–10.5)

## 2015-06-16 ENCOUNTER — Non-Acute Institutional Stay: Payer: Commercial Managed Care - HMO | Admitting: Internal Medicine

## 2015-06-16 DIAGNOSIS — I5032 Chronic diastolic (congestive) heart failure: Secondary | ICD-10-CM | POA: Diagnosis not present

## 2015-06-16 DIAGNOSIS — Z933 Colostomy status: Secondary | ICD-10-CM

## 2015-06-16 DIAGNOSIS — N289 Disorder of kidney and ureter, unspecified: Secondary | ICD-10-CM

## 2015-06-16 DIAGNOSIS — K5660 Unspecified intestinal obstruction: Secondary | ICD-10-CM | POA: Diagnosis not present

## 2015-06-16 DIAGNOSIS — K56609 Unspecified intestinal obstruction, unspecified as to partial versus complete obstruction: Secondary | ICD-10-CM

## 2015-06-16 NOTE — Progress Notes (Signed)
Patient ID: Alexa Key, female   DOB: [birth year], 65 y.o.   MRN: [id number]     This is an acute visit.  Level care skilled.  Facility [facility].  Chief complaint acute vvisit--secondary to follow-up renal insufficiency  History of present illness.  Patient is a pleasant 65 year old female who is status post colostomy-and recently underwent colostomy closure-she had a slow recovery after this.  Her bowel function did return apparently after 3 days and continue with loose stools.  C. difficile toxin was negative but Atigen  was present felt risk of colitis low but given her frailty she was started on Flagyl 500 mg 3 times a day.  She has been started on a wound VAC and this is being followed by wound care.--Initially there was some hesitation to use this but she has been using it and wound care is following this-per wound care wound appears to be stable  She does have a history of renal insufficiency per chart review creatinine ranges recently from 1.23-1.53 and the past 2 weeks-on lab done yesterday creatinine was 1.61 BUN was 14 electrolytes were within normal range-she is not on a diuretic.  She says she is eating and drinking fairly well.  She's also asking if she can be taken off the Lovenox she is now quite ambulatory she is on this per chart review it appears for DVT prophylaxis since she was somewhat sedentary in the hospital she is also on aspirin 325 mg a day     Previous medical history is.  History of colostomy.  Subclavian steal syndrome.  Acute on chronic diastolic CHF.  History of ileus.  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  Chronic kidney disease stage III.  GERD.  Coronary artery disease status post PCI.  Peripheral vascular disease.  She'll be.  Hypertension.  C history of cervical radiculopathy as C7.  Dyslipidemia.  History of tobacco use.  History of kidney stones.  History  of non-ST MI.  Medical social history reviewed per discharge summary on February 2017.  Medications.  Tylenol 650 g every 6 hours when necessary.  Albuterol nebulizers every 6 hours when necessary.  Maalox every 6 hours when necessary.  Norvasc 10 mg daily.  Aspirin 325 mg daily.  Symbicort 2 puffs in the lungs twice a day.  Vitamin D 2000 units daily at bedtime.  Lovenox 40 mg daily.  Imdur 30 mg daily.  Imodium 2 mg every 6 hours when necessary.  Lopressor 12.5 mg twice a day.  Flagyl 5 mg every 8 hours.  Prilosec 20 mg daily.  Zofran 4 mg every 6 hours when necessary nausea.  Percocet 7. 09/02/2023 milligrams every 4 hours when necessary.  Pravastatin 80 mg daily.  Spiriva 18 g daily.  Zanaflex 2 mg every 6 hours when necessary tramadol 50 mg every 6 hours when necessary.  Review of systems.  In general she is not complaining of any fever or chills.  Skin is not complaining of rashes or itching Her wound care abdominal wound is stable she does have a wound VAC  No rashes no itching.  Head ears eyes nose mouth and throat does not complain of visual changes or sore throat.  Respiratory is not complaining of shortness breath or cough.  Cardiac no chest pain or significant lower extremity edema.  GI significant history as noted above but is not complaining of abdominal discomfort says her appetite i Continues to improve.  GU does not complain  of dysuria.  Muscle skeletal has weakness but does not complaining of joint pain.  Neurologic is not complaining of dizziness headache or syncopal-type feelings.   psych is not complaining of depression or anxiety peers to be in good spirits.  Physical exam.  Tr 98.2 pulse 70 respirations 18 blood pressure 136/58 weight is stable at 132.8.  In general this is a pleasant elderly female in no distress she appears to be good spirits.  Her skin is warm and dry there is a mid abdominal wound  Wound VAC is  currently applied per wound care this is stable without sign of infection-.  Eyes pupils appear reactive light sclera and conjunctiva clear visual acuity appears grossly intact.  Oropharynx is clear mucous membranes moist.  Chest is clear to auscultation there is no labored breathing.  Heart is regular rate and rhythm with occasional irregular beats-she has somewhat decreased pedal pulses there is no lower extremity edema.  Abdomen soft nontender a Bowel sounds are positive she does have wound VAC applied  Muscular skeletal.--  move all extremities at baseline strength appears to be intact I did not note any deformities.  Neurologic is grossly intact no lateralizing findings her speech is clear.  Psych she is alert and or anus pleasant and appropriate.  Labs.  Jun 15 2015.  Sodium 144 potassium 4.3 BUN 14 creatinine 1.61.  WBC 11.0 hemoglobin 9.0 platelets 290.  06/12/2015  Sodium 144 potassium 3.7 BUN 12 creatinine 1.42.  Albumin 2.7-AST 14 ALT 9 otherwise liver function tests within normal limits    06/11/2015.  WBC 12.3 hemoglobin 10.2 platelets 248.  C. difficile antigen positive toxin was negative.  [facility] 2016.  Sodium 144 potassium 3.7 BUN 12 creatinine 1.4 to.  Albumin 2.7 AST is 14 ALT is 19.  I know previous creatinines have been in the 1.25 range.  Assessment plan.  History of colostomy closure-she appears to have tolerated the procedure well but apparently has similar general weakness and a slow recovery she is here for rehabilitation as well as wound care-she does have a wound VAC  She has agreed to use this per wound care wound looks to be quite stable She is receiving Percocet as needed for pain as well as tramadol if needed this appears to be effective this time.  Of note she does continue on Flagyl as noted above she does have a C. difficile toxin is negative but Antigenis present with her frailty it was thought best to start her  empirically on Flagyl.  #2 history of acute on chronic diastolic CHF at this point appears stable I do not really note any significant edema or shortness of breath she is not on any diuretic currently she does continue on Imdur at this point will monitor.--She also continues on a beta blocker--her weights appear to be stable  #3 history of coronary artery disease-she is on a stain--aspirin as well as Imdur-. Aspirin as well as Imdur  #4 history of chronic kidney disease  Creatinine is slowly rising 1.61-baseline appears to be in the 1. 2-1 0.5 range-at this point continue to monitor her fluid intake apparently has improved again this is complicated somewhat with her history of CHF-she is not on any diuretic currently 18 and updated metabolic panel later this week.  #5 history of GERD she is on Prilosec this appears stable at this point.  #6 history COPD she is on numerous medications including albuterol inhaler when necessary-Symbicort twice a day as well as Spiriva.  This has not really been an issue during her stay here At this point appears to be stable.  Hypertension-she continues again on Norvasc Lopressor and Imdur at this point will monitor blood pressures 136/58-121/54   Patient has requested to be taken off her Lovenox this appears to be appropriate since she is now more ambulatory-this was discussed with Dr. Dellia Nims via phone she does continue on aspirin 325 mg a day.  [id number]

## 2015-06-18 ENCOUNTER — Ambulatory Visit (HOSPITAL_COMMUNITY): Payer: Commercial Managed Care - HMO | Attending: Internal Medicine

## 2015-06-18 ENCOUNTER — Encounter (HOSPITAL_COMMUNITY)
Admission: AD | Admit: 2015-06-18 | Discharge: 2015-06-18 | Disposition: A | Discharge status: Home / Self Care | Payer: Commercial Managed Care - HMO | Source: Skilled Nursing Facility | Attending: Internal Medicine | Admitting: Internal Medicine

## 2015-06-18 DIAGNOSIS — J984 Other disorders of lung: Secondary | ICD-10-CM | POA: Diagnosis not present

## 2015-06-18 DIAGNOSIS — I517 Cardiomegaly: Secondary | ICD-10-CM | POA: Diagnosis not present

## 2015-06-18 DIAGNOSIS — R0989 Other specified symptoms and signs involving the circulatory and respiratory systems: Secondary | ICD-10-CM | POA: Diagnosis not present

## 2015-06-18 LAB — CBC WITH DIFFERENTIAL/PLATELET
BASOS PCT: 0 %
Basophils Absolute: 0 10*3/uL (ref 0.0–0.1)
Eosinophils Absolute: 0.3 10*3/uL (ref 0.0–0.7)
Eosinophils Relative: 2 %
HEMATOCRIT: 34.5 % — AB (ref 36.0–46.0)
Hemoglobin: 10.8 g/dL — ABNORMAL LOW (ref 12.0–15.0)
LYMPHS ABS: 2.6 10*3/uL (ref 0.7–4.0)
Lymphocytes Relative: 19 %
MCH: 31.1 pg (ref 26.0–34.0)
MCHC: 31.3 g/dL (ref 30.0–36.0)
MCV: 99.4 fL (ref 78.0–100.0)
MONO ABS: 1 10*3/uL (ref 0.1–1.0)
MONOS PCT: 7 %
NEUTROS ABS: 9.6 10*3/uL — AB (ref 1.7–7.7)
Neutrophils Relative %: 72 %
Platelets: 406 10*3/uL — ABNORMAL HIGH (ref 150–400)
RBC: 3.47 MIL/uL — ABNORMAL LOW (ref 3.87–5.11)
RDW: 14.2 % (ref 11.5–15.5)
WBC: 13.5 10*3/uL — ABNORMAL HIGH (ref 4.0–10.5)

## 2015-06-18 LAB — BASIC METABOLIC PANEL
ANION GAP: 8 (ref 5–15)
BUN: 13 mg/dL (ref 6–20)
CALCIUM: 8.4 mg/dL — AB (ref 8.9–10.3)
CHLORIDE: 110 mmol/L (ref 101–111)
CO2: 25 mmol/L (ref 22–32)
CREATININE: 1.45 mg/dL — AB (ref 0.44–1.00)
GFR calc Af Amer: 42 mL/min — ABNORMAL LOW (ref >60)
GFR calc non Af Amer: 37 mL/min — ABNORMAL LOW (ref >60)
GLUCOSE: 108 mg/dL — AB (ref 65–99)
Potassium: 4.5 mmol/L (ref 3.5–5.1)
Sodium: 143 mmol/L (ref 135–145)

## 2015-06-18 LAB — URINALYSIS W MICROSCOPIC (NOT AT ARMC)
Glucose, UA: NEGATIVE mg/dL
Hgb urine dipstick: NEGATIVE
Leukocytes, UA: NEGATIVE
NITRITE: NEGATIVE
PROTEIN: 100 mg/dL — AB
RBC / HPF: NONE SEEN RBC/hpf (ref 0–5)
SPECIFIC GRAVITY, URINE: 1.02 (ref 1.005–1.030)
pH: 5.5 (ref 5.0–8.0)

## 2015-06-18 NOTE — Progress Notes (Addendum)
Patient ID: Alexa Key, female   DOB: October 18, 1950, 65 y.o.   MRN: [id number]                HISTORY & PHYSICAL  DATE:  06/14/2015          FACILITY: [facility]               LEVEL OF CARE:   SNF   CHIEF COMPLAINT:  Admission to SNF, post stay at [facility], 06/02/2015 through 06/10/2015.    HISTORY OF PRESENT ILLNESS:  This is a patient who had a partial colectomy and colostomy in June 2016 secondary to a sigmoid colonic diverticular stricture.  She was admitted on this occasion for elective closure of the colostomy.    Intraoperatively, she was noted to have dense adhesions.  She had some unhealthy-looking jejunum and, therefore, required a removal of segment of jejunum intraoperatively.  She, therefore, had wounds that were left in place.  She is discharged here with a wound VAC that is to be changed three times a week.    She had slow recovery after colostomy closure with three days without bowel movement.  There was some concern after that that she had pseudomembranous colitis.  C.diff toxin was negative.  However, antigen was positive.  She was treated, nevertheless, with Flagyl 500 t.i.d.    Currently, the patient has had issues with regards to the wound VAC that she had ordered from the hospital.   She finds the wound VAC change very painful, so much so that she has refused the wound VAC and so far we are only using wet to dry dressings.  I received calls about this this week and I have discussed this with the patient today.    PAST MEDICAL HISTORY/PROBLEM LIST:   Past Surgical History  Procedure Laterality Date  . Hysterectomoy    . Lumbar spine surgery    . Carpal tunnel release    . Coronary angioplasty    . Coronary stents     . Partial lobectomy of lung   1998  . Abdominal hysterectomy    . Back surgery    . Nephrolithotomy Right 12/24/2012    Procedure: NEPHROLITHOTOMY PERCUTANEOUS;  Surgeon: Franchot Gallo, MD;  Location: [facility];  Service: Urology;   Laterality: Right;  . Flexible sigmoidoscopy  07/11/2014    Procedure: FLEXIBLE SIGMOIDOSCOPY;  Surgeon: Leighton Ruff, MD;  Location: [facility];  Service: Endoscopy;;  . Laparotomy N/A 10/02/2014    Procedure: EXPLORATORY LAPAROTOMY WITH PARTIAL COLECTOMY/COLOSTOMY;  Surgeon: Erroll Luna, MD;  Location: [place];  Service: General;  Laterality: N/A;  . Cardiac catheterization N/A 10/15/2014    Procedure: Left Heart Cath and Coronary Angiography;  Surgeon: Sherren Mocha, MD;  Location: [place] [facility];  Service: Cardiovascular;  Laterality: N/A;  . Colonoscopy N/A 01/29/2015    Procedure: COLONOSCOPY;  Surgeon: Leighton Ruff, MD;  Location: [facility];  Service: Endoscopy;  Laterality: N/A;  . Colostomy closure N/A 06/02/2015    Procedure: COLOSTOMY CLOSURE;  Surgeon: Erroll Luna, MD;  Location: [place];  Service: General;  Laterality: N/A;  . Partial colectomy N/A 06/02/2015    Procedure: PARTIAL COLECTOMY;  Surgeon: Erroll Luna, MD;  Location: [place];  Service: General;  Laterality: N/A;      PAST SURGICAL HISTORY:   Past Surgical History  Procedure Laterality Date  . Hysterectomoy    . Lumbar spine surgery    . Carpal tunnel release    .  Coronary angioplasty    . Coronary stents     . Partial lobectomy of lung   1998  . Abdominal hysterectomy    . Back surgery    . Nephrolithotomy Right 12/24/2012    Procedure: NEPHROLITHOTOMY PERCUTANEOUS;  Surgeon: Franchot Gallo, MD;  Location: [facility];  Service: Urology;  Laterality: Right;  . Flexible sigmoidoscopy  07/11/2014    Procedure: FLEXIBLE SIGMOIDOSCOPY;  Surgeon: Leighton Ruff, MD;  Location: [facility];  Service: Endoscopy;;  . Laparotomy N/A 10/02/2014    Procedure: EXPLORATORY LAPAROTOMY WITH PARTIAL COLECTOMY/COLOSTOMY;  Surgeon: Erroll Luna, MD;  Location: [place];  Service: General;  Laterality: N/A;  . Cardiac catheterization N/A 10/15/2014    Procedure: Left Heart Cath and Coronary Angiography;  Surgeon: Sherren Mocha, MD;  Location: [place] [facility];  Service: Cardiovascular;  Laterality: N/A;  . Colonoscopy N/A 01/29/2015    Procedure: COLONOSCOPY;  Surgeon: Leighton Ruff, MD;  Location: [facility];  Service: Endoscopy;  Laterality: N/A;  . Colostomy closure N/A 06/02/2015    Procedure: COLOSTOMY CLOSURE;  Surgeon: Erroll Luna, MD;  Location: [place];  Service: General;  Laterality: N/A;  . Partial colectomy N/A 06/02/2015    Procedure: PARTIAL COLECTOMY;  Surgeon: Erroll Luna, MD;  Location: [place];  Service: General;  Laterality: N/A;   CURRENT MEDICATIONS:  Medication list is reviewed.         Tylenol 650 q.6 hours p.r.n.    Albuterol 2 puffs q.6 hours p.r.n.     Mylanta 30 mL q.6 hours p.r.n. for indigestion.    Norvasc 10 q.d.    ASA 325 q.d.      Symbicort 2 puffs two times daily.     Vitamin D 2000 daily.    Lovenox 40 daily (DVT prophylaxis?).  I do not see an indication for this and I think this can probably be stopped when she is mobile.    Imdur 30 q.d.      Loperamide/Imodium 2 capsules every 6 hours p.r.n. for diarrhea.    Lopressor 12.5 mg b.i.d.     Flagyl 500 every 8 hours, which I will continue for another four days, then discontinue.    Prilosec 20 q.d.      Zofran 4 mg every 6 hours p.r.n.     Oxycodone/acetaminophen 7.5/325, 2 tablets q.4 p.r.n.      Pravachol 80 q.d. at bedtime.    Spiriva 1 capsule, 18 mcg daily.     Zanaflex 2 mg every 6 hours p.r.n.     Ultram 50-100 mg q.6 hours p.r.n. mild to moderate pain.    LABORATORY DATA:  Most recent lab work:    White count 12.3, hemoglobin 10.2.    Sodium 144, BUN 12, creatinine 1.42.    Albumin 2.7.    SOCIAL HISTORY:                   HOUSING:  The patient tells me that she lives with her grandson.   FUNCTIONAL STATUS:  Independent prior to admission.    FAMILY HISTORY:   Reviewed.      REVIEW OF SYSTEMS:       HEENT:   No headache.           CHEST/RESPIRATORY:  No shortness of breath.   No cough.   CARDIAC:  No chest pain.    GI:  States she had two loose stools yesterday.  Has had one so far today.  No abdominal pain.  GU:  No dysuria.     PHYSICAL EXAMINATION:   VITAL SIGNS:     PULSE:  63.   RESPIRATIONS:  18.   BLOOD PRESSURE:   90/40 yesterday.   WEIGHT:  132.3 pounds.   GENERAL APPEARANCE:  The patient is not in any distress.   CHEST/RESPIRATORY:  Clear air entry bilaterally.     CARDIOVASCULAR:   CARDIAC:  Heart sounds are normal.  There are no signs of heart failure.  She appears to be euvolemic.     GASTROINTESTINAL:   ABDOMEN:  Nondistended.  Bowel sounds are positive.  She has two wounds, a linear wound at the umbilicus and a smaller wound but with some depth to the left of this.  There are no abdominal masses or tenderness.     LIVER/SPLEEN/KIDNEY:   No liver, no spleen.   GENITOURINARY:   BLADDER:  Not distended.  There is no CVA tenderness.   SKIN:   INSPECTION:  Both of her surgical wounds have a clean, granulating base although there is depth and size to both of these.  No drainage.     ASSESSMENT/PLAN:                Status post ostomy reversal.  Although there was some concern about her diarrhea in the hospital, she states she only had two bowel movements yesterday and one today.  There is no evidence of C.diff.  I think her Flagyl can stop in four days.    COPD.  This does not appear to be unstable.  Her exam is fairly benign.    History of coronary artery disease.  This does not appear to be unstable.  She does not describe chest pain.    On Lovenox, which I think is for DVT prophylaxis.  I think this can probably stop once she is ambulatory.    Wounds left to heal on her abdomen.  I think this was because of intraoperative resection of the jejunum that did not look viable at the time of surgery.  She also had extensive adhesions.  The wounds really look quite clean and I think she would benefit a lot from a wound VAC.  I spent some time  discussing this with her.  She finds the dressing changes painful, which we could address by medicating her prior to dressing changes.  Perhaps collagen under the wound VAC might help.  For now, she is just receiving wet to dry dressings daily.    Postoperative ileus.  This appears to be resolved.  She has no other abdominal findings, nondistended and soft.    The patient appears to be stable.    I have written a prescription for a sleeping medication at the patient's request.    I spent a long time talking to her about the benefits of a wound VAC here.  I think she will elect to give this another try, but we did not change that today.

## 2015-06-19 ENCOUNTER — Encounter (HOSPITAL_COMMUNITY)
Admission: AD | Admit: 2015-06-19 | Discharge: 2015-06-19 | Disposition: A | Discharge status: Home / Self Care | Payer: Commercial Managed Care - HMO | Source: Skilled Nursing Facility | Attending: Internal Medicine | Admitting: Internal Medicine

## 2015-06-19 LAB — CBC WITH DIFFERENTIAL/PLATELET
BASOS ABS: 0 10*3/uL (ref 0.0–0.1)
Basophils Relative: 0 %
EOS PCT: 3 %
Eosinophils Absolute: 0.3 10*3/uL (ref 0.0–0.7)
HEMATOCRIT: 32.6 % — AB (ref 36.0–46.0)
Hemoglobin: 9.9 g/dL — ABNORMAL LOW (ref 12.0–15.0)
LYMPHS ABS: 2.2 10*3/uL (ref 0.7–4.0)
LYMPHS PCT: 22 %
MCH: 30.7 pg (ref 26.0–34.0)
MCHC: 30.4 g/dL (ref 30.0–36.0)
MCV: 101.2 fL — AB (ref 78.0–100.0)
MONO ABS: 1 10*3/uL (ref 0.1–1.0)
MONOS PCT: 10 %
NEUTROS ABS: 6.7 10*3/uL (ref 1.7–7.7)
Neutrophils Relative %: 66 %
Platelets: 310 10*3/uL (ref 150–400)
RBC: 3.22 MIL/uL — ABNORMAL LOW (ref 3.87–5.11)
RDW: 13.6 % (ref 11.5–15.5)
WBC: 10.1 10*3/uL (ref 4.0–10.5)

## 2015-06-19 LAB — URINE CULTURE: Culture: 4000

## 2015-06-20 ENCOUNTER — Encounter: Payer: Self-pay | Admitting: Internal Medicine

## 2015-06-20 DIAGNOSIS — N289 Disorder of kidney and ureter, unspecified: Secondary | ICD-10-CM | POA: Insufficient documentation

## 2015-06-24 ENCOUNTER — Non-Acute Institutional Stay (SKILLED_NURSING_FACILITY): Payer: Commercial Managed Care - HMO | Admitting: Internal Medicine

## 2015-06-24 ENCOUNTER — Encounter: Payer: Self-pay | Admitting: Internal Medicine

## 2015-06-24 DIAGNOSIS — Z933 Colostomy status: Secondary | ICD-10-CM | POA: Diagnosis not present

## 2015-06-24 DIAGNOSIS — N183 Chronic kidney disease, stage 3 unspecified: Secondary | ICD-10-CM

## 2015-06-24 DIAGNOSIS — J42 Unspecified chronic bronchitis: Secondary | ICD-10-CM | POA: Diagnosis not present

## 2015-06-24 DIAGNOSIS — I5033 Acute on chronic diastolic (congestive) heart failure: Secondary | ICD-10-CM | POA: Diagnosis not present

## 2015-06-24 NOTE — Progress Notes (Signed)
Patient ID: Alexa Key, female   DOB: [birth year], 65 y.o.   MRN: [id number]      This is a discharge note.  Level care skilled.  Facility [facility].  Chief complaint acute visit--discharge note  History of present illness.  Patient is a pleasant 65 year old female who is status post colostomy-and recently underwent colostomy closure-she had a slow recovery after this.  Her bowel function did return apparently after 3 days and continue with loose stools.  C. difficile toxin was negative but Atigen  was present felt risk of colitis low but given her frailty she was started on Flagy  She has been started on a wound VAC and this is being followed by wound care.--Initially there was some hesitation to use this but she has been using it and wound care is following this-per wound care wound appears to be stable and improving  She does have a history of renal insufficiency per chart review creatinine ranges recently in the mid 1 status most recently 1.45 on lab done February 16  She was on Lovenox for DVT prophylaxis this has  been discontinued she continues on aspirin  Currently she has no complaints she is looking forward to going home her grandson does live with her she will need continued PT and OT for strengthening as well as nursing support especially in regards to her wound VAC.  She has follow-up with surgery on February 24   Previous medical history is.  History of colostomy.  Subclavian steal syndrome.  Acute on chronic diastolic CHF.  History of ileus.  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  Chronic kidney disease stage III.  GERD.  Coronary artery disease status post PCI.  Peripheral vascular disease.  She'll be.  Hypertension.  C history of cervical radiculopathy as C7.  Dyslipidemia.  History of tobacco use.  History of kidney stones.  History of non-ST MI.  Medical social history  reviewed per discharge summary on February 2017.  Medications.  Tylenol 650 g every 6 hours when necessary.  Albuterol nebulizers every 6 hours when necessary.  Maalox every 6 hours when necessary.  Norvasc 10 mg daily.  Aspirin 325 mg daily.  Symbicort 2 puffs in the lungs twice a day.  Vitamin D 2000 units daily at bedtime.  Lovenox 40 mg daily.  Imdur 30 mg daily.  Imodium 2 mg every 6 hours when necessary.  Lopressor 12.5 mg twice a day.  Flagyl 5 mg every 8 hours.  Prilosec 20 mg daily.  Zofran 4 mg every 6 hours when necessary nausea.  Percocet 7. 09/02/2023 milligrams every 4 hours when necessary.  Pravastatin 80 mg daily.  Spiriva 18 g daily.  Zanaflex 2 mg every 6 hours when necessary tramadol 50 mg every 6 hours when necessary.  Review of systems.  In general she is not complaining of any fever or chills.  Skin is not complaining of rashes or itching Her wound care abdominal wound is improving she does have a wound VAC  No rashes no itching.  Head ears eyes nose mouth and throat does not complain of visual changes or sore throat.  Respiratory is not complaining of shortness breath or cough.  Cardiac no chest pain or significant lower extremity edema.  GI significant history as noted above but is not complaining of abdominal discomfort says her appetite i Continues to improve.  GU does not complain of dysuria.  Muscle skeletal has weakness but has gained strength  does not complaining of joint pain.  Neurologic is not complaining of dizziness headache or syncopal-type feelings.   psych is not complaining of depression or anxiety peers to be in good spirits.  Physical exam.  Temperature is 97.8 pulse 87 respirations 20 blood pressure 142/76-131/59-weight is 131.1  In general this is a pleasant elderly female in no distress she appears to be good spirits.  Her skin is warm and dry there is a mid abdominal wound  This has pink tissue is  clean no odo--no surrounding erythema r this appears to be healing actually quite well no sign of infection.  Eyes pupils appear reactive light sclera and conjunctiva clear visual acuity appears grossly intact.  Oropharynx is clear mucous membranes moist.  Chest is clear to auscultation there is no labored breathing.  Heart is regular rate and rhythm with occasional irregular beats--there is no lower extremity edema.  Abdomen soft nontender a Bowel sounds are positive she does have wound VAC applied  Muscular skeletal.--  move all extremities at baseline strength appears to be intact I did not note any deformities.  Neurologic is grossly intact no lateralizing findings her speech is clear.  Psych she is alert and or anus pleasant and appropriate.  Labs.  10-20-202017.  WBC 10.1 hemoglobin 9.9 platelets 310.  06/18/2015.  Sodium 143 potassium 4.5 BUN 13 creatinine 1.45 Jun 15 2015.  Sodium 144 potassium 4.3 BUN 14 creatinine 1.61.  WBC 11.0 hemoglobin 9.0 platelets 290.  06/12/2015  Sodium 144 potassium 3.7 BUN 12 creatinine 1.42.  Albumin 2.7-AST 14 ALT 9 otherwise liver function tests within normal limits    06/11/2015.  WBC 12.3 hemoglobin 10.2 platelets 248.  C. difficile antigen positive toxin was negative.  [place] [facility] 2016.  Sodium 144 potassium 3.7 BUN 12 creatinine 1.4 to.  Albumin 2.7 AST is 14 ALT is 19.  I know previous creatinines have been in the 1.25 range.  Assessment plan.  History of colostomy closure-she appears to have tolerated the procedure--wound VAC appears to be doing well with healing the wound-she has gained strength-she will need continued PT and OT as well as follow-up of the wound as an outpatient--t she is receiving tramadol for pain.  Of note she does continue on Flagyl as noted above she does have a C. difficile toxin is negative but Antigenis present with her frailty it was thought best to start her empirically on  Flagyl.  #2 history of acute on chronic diastolic CHF at this point appears stable I do not really note any significant edema or shortness of breath she is not on any diuretic currently she does continue on Imdur at this point will monitor.--She also continues on a beta blocker--her weights appear to be stable as were knee followed by primary care provider as needed will update a metabolic panel  #3 history of coronary artery disease-she is on a stain--aspirin as well as Imdur-. Aspirin as well as Imdur Assessment stable during her stay here #4 history of chronic kidney disease  Most recent creatinine of 1.45 appears to be stable we will update this    #5 history of GERD she is on Prilosec this appears stable at this point.  #6 history COPD she is on numerous medications including albuterol inhaler when necessary-Symbicort twice a day as well as Spiriva. This has not really been an issue during her stay here At this point appears to be stable.  Hypertension-she continues again on Norvasc Lopressor and Imdur  This appears relatively  stable recent blood pressures 131/59-142/76  Again patient will have follow-up with surgery-she will need PT and OT for further strengthening as well as nursing support especially in regards to her wound care-she does live with her grandson who apparently is quite supportive.  [id number] note greater than 30 minutes spent on this discharge summary-greater than 50% of time spent coordinating plan of care for numerous diagnoses

## 2015-06-25 ENCOUNTER — Encounter (HOSPITAL_COMMUNITY)
Admission: AD | Admit: 2015-06-25 | Discharge: 2015-06-25 | Disposition: A | Discharge status: Home / Self Care | Payer: Commercial Managed Care - HMO | Source: Skilled Nursing Facility | Attending: Internal Medicine | Admitting: Internal Medicine

## 2015-06-25 LAB — BASIC METABOLIC PANEL WITH GFR
Anion gap: 7 (ref 5–15)
BUN: 16 mg/dL (ref 6–20)
CO2: 26 mmol/L (ref 22–32)
Calcium: 9 mg/dL (ref 8.9–10.3)
Chloride: 110 mmol/L (ref 101–111)
Creatinine, Ser: 1.24 mg/dL — ABNORMAL HIGH (ref 0.44–1.00)
GFR calc Af Amer: 51 mL/min — ABNORMAL LOW
GFR calc non Af Amer: 44 mL/min — ABNORMAL LOW
Glucose, Bld: 104 mg/dL — ABNORMAL HIGH (ref 65–99)
Potassium: 5.2 mmol/L — ABNORMAL HIGH (ref 3.5–5.1)
Sodium: 143 mmol/L (ref 135–145)

## 2015-06-25 LAB — CBC WITH DIFFERENTIAL/PLATELET
Basophils Absolute: 0 10*3/uL (ref 0.0–0.1)
Basophils Relative: 0 %
Eosinophils Absolute: 0.3 10*3/uL (ref 0.0–0.7)
Eosinophils Relative: 3 %
HCT: 36.8 % (ref 36.0–46.0)
Hemoglobin: 11.3 g/dL — ABNORMAL LOW (ref 12.0–15.0)
Lymphocytes Relative: 20 %
Lymphs Abs: 2.4 10*3/uL (ref 0.7–4.0)
MCH: 30.5 pg (ref 26.0–34.0)
MCHC: 30.7 g/dL (ref 30.0–36.0)
MCV: 99.5 fL (ref 78.0–100.0)
Monocytes Absolute: 0.9 10*3/uL (ref 0.1–1.0)
Monocytes Relative: 7 %
Neutro Abs: 8.5 10*3/uL — ABNORMAL HIGH (ref 1.7–7.7)
Neutrophils Relative %: 70 %
Platelets: 379 10*3/uL (ref 150–400)
RBC: 3.7 MIL/uL — ABNORMAL LOW (ref 3.87–5.11)
RDW: 14.2 % (ref 11.5–15.5)
WBC: 12.1 10*3/uL — ABNORMAL HIGH (ref 4.0–10.5)

## 2015-06-30 ENCOUNTER — Other Ambulatory Visit: Payer: Self-pay | Admitting: *Deleted

## 2015-06-30 NOTE — Telephone Encounter (Signed)
Please review and advise.

## 2015-07-03 ENCOUNTER — Telehealth: Payer: Self-pay | Admitting: Family Medicine

## 2015-07-03 NOTE — Telephone Encounter (Signed)
Advised pt she would ntbs for a follow up after being discharged from the nursing home. Pt voiced understanding and will CB once she speaks with her daughter.

## 2015-07-31 ENCOUNTER — Ambulatory Visit (INDEPENDENT_AMBULATORY_CARE_PROVIDER_SITE_OTHER): Payer: Commercial Managed Care - HMO | Admitting: Family Medicine

## 2015-07-31 ENCOUNTER — Encounter: Payer: Self-pay | Admitting: Family Medicine

## 2015-07-31 ENCOUNTER — Ambulatory Visit (INDEPENDENT_AMBULATORY_CARE_PROVIDER_SITE_OTHER): Payer: Commercial Managed Care - HMO

## 2015-07-31 VITALS — BP 166/77 | HR 56 | Temp 97.6°F | Ht 61.0 in | Wt 127.0 lb

## 2015-07-31 DIAGNOSIS — R109 Unspecified abdominal pain: Secondary | ICD-10-CM

## 2015-07-31 MED ORDER — LORAZEPAM 0.5 MG PO TABS: 0.5000 mg | ORAL_TABLET | Freq: Two times a day (BID) | ORAL | Status: DC | PRN

## 2015-07-31 MED ORDER — TRAMADOL HCL 50 MG PO TABS: 50.0000 mg | ORAL_TABLET | Freq: Three times a day (TID) | ORAL | Status: DC | PRN

## 2015-07-31 MED ORDER — ONDANSETRON HCL 4 MG PO TABS: 4.0000 mg | ORAL_TABLET | Freq: Four times a day (QID) | ORAL | Status: DC | PRN

## 2015-07-31 NOTE — Progress Notes (Signed)
Subjective:    Patient ID: Alexa Key, female    DOB: 16-Mar-1951, 65 y.o.   MRN: [id number]  HPI 65 year old female who presents today with nausea and vomiting. She was recently hospitalized for reversal of colostomy. 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 After the surgery she was transferred to skilled nursing facility for wound care. Since she has been home she has not had any problems with bowel movements or excessive pain but now has developed some nausea and vomiting. She is not vomiting any fecal material. The pain is generalized. She still does have her gallbladder.  Patient Active Problem List   Diagnosis Date Noted  . Renal insufficiency 06/20/2015  . Colostomy in place [facility]) 06/02/2015  . S/P colostomy ([place]) 06/02/2015  . Subclavian steal syndrome   . Acute on chronic diastolic heart failure ([place])   . Ileus ([place])   . Hypoxia   . 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 Abdominal pain 09/29/2014  . CKD (chronic kidney disease) stage 3, GFR 30-59 ml/min 09/29/2014  . GERD (gastroesophageal reflux disease) 09/29/2014  . Abnormal cardiac function test 09/22/2014  . CAD S/P remote PCI (no details) 09/22/2014  . PVD- remote Lt [facility] PTA (Dr Kellie Simmering) 09/22/2014  . COPD (chronic obstructive pulmonary disease) ([place]) 08/11/2014  . Essential hypertension 10/16/2013  . Cervical radiculopathy at C7 06/04/2013  . Dyslipidemia 12/13/2012  . Colovaginal fistula s/p colectomy/colostomy/repair 10/02/2014 11/15/2012  . Tobacco user 09/25/2012  . Kidney stone 09/25/2012  . NSTEMI (non-ST elevated myocardial infarction) ([place]) 08/07/2012   Outpatient Encounter Prescriptions as of 07/31/2015  Medication Sig  . acetaminophen (TYLENOL) 325 MG tablet Take 2 tablets (650 mg total) by mouth every 6 (six) hours as needed (fever > 101).  Marland Kitchen albuterol (PROVENTIL HFA;VENTOLIN HFA) 108 (90 BASE) MCG/ACT inhaler Inhale 2 puffs into the  lungs every 6 (six) hours as needed for wheezing or shortness of breath.  Marland Kitchen alum & mag hydroxide-simeth (MAALOX/MYLANTA) 200-200-20 MG/5ML suspension Take 30 mLs by mouth every 6 (six) hours as needed for indigestion (gas/bloating).  Marland Kitchen amLODipine (NORVASC) 10 MG tablet Take 1 tablet (10 mg total) by mouth daily.  Marland Kitchen aspirin 325 MG tablet Take 325 mg by mouth daily.  . budesonide-formoterol (SYMBICORT) 160-4.5 MCG/ACT inhaler Inhale 2 puffs into the lungs 2 (two) times daily.  . cholecalciferol (VITAMIN D) 1000 UNITS tablet Take 2 tablets (2,000 Units total) by mouth at bedtime.  . enoxaparin (LOVENOX) 40 MG/0.4ML injection Inject 0.4 mLs (40 mg total) into the skin daily.  . feeding supplement (BOOST / RESOURCE BREEZE) LIQD Take 1 Container by mouth 3 (three) times daily between meals.  . isosorbide mononitrate (IMDUR) 30 MG 24 hr tablet Take 1 tablet (30 mg total) by mouth daily.  Marland Kitchen loperamide (IMODIUM) 2 MG capsule Take 1 capsule (2 mg total) by mouth every 6 (six) hours as needed for diarrhea or loose stools.  . metoprolol tartrate (LOPRESSOR) 25 MG tablet Take 0.5 tablets (12.5 mg total) by mouth 2 (two) times daily.  Marland Kitchen omeprazole (PRILOSEC) 20 MG capsule TAKE 1 CAPSULE (20 MG TOTAL)  BY MOUTH DAILY.  Marland Kitchen ondansetron (ZOFRAN) 4 MG tablet Take 1 tablet (4 mg total) by mouth every 6 (six) hours as needed for nausea.  . pravastatin (PRAVACHOL) 80 MG tablet Take 1 tablet (80 mg total) by mouth at bedtime.  . pravastatin (PRAVACHOL) 80 MG tablet Take 1 tablet (80 mg total) by mouth at bedtime.  Marland Kitchen  tiotropium (SPIRIVA) 18 MCG inhalation capsule Place 1 capsule (18 mcg total) into inhaler and inhale daily.  Marland Kitchen tiZANidine (ZANAFLEX) 2 MG tablet Take 1 tablet (2 mg total) by mouth every 6 (six) hours as needed.  . traMADol (ULTRAM) 50 MG tablet Take 1-2 tablets (50-100 mg total) by mouth every 6 (six) hours as needed for moderate pain.  . [DISCONTINUED] metroNIDAZOLE (FLAGYL) 500 MG tablet Take 1 tablet (500  mg total) by mouth every 8 (eight) hours.   No facility-administered encounter medications on file as of 07/31/2015.      Review of Systems  Constitutional: Negative.   Respiratory: Negative.   Cardiovascular: Negative.   Gastrointestinal: Positive for nausea, vomiting and abdominal pain.  Psychiatric/Behavioral: Negative.        Objective:   Physical Exam  Constitutional: She is oriented to person, place, and time. She appears well-developed and well-nourished.  Abdominal: Soft.  . Abdominal x-ray done to rule out obstruction. There is a normal bowel gas pattern with lots of fecal material visible Bowel sounds are hypoactive at best  Neurological: She is alert and oriented to person, place, and time.  Psychiatric: She has a normal mood and affect. Her behavior is normal.          Assessment & Plan:  1. Abdominal pain, unspecified abdominal location Pain of uncertain etiology. I would like to get her cleaned out. She has laxative at home that she can take. I did refill Rx for Zofran and tramadol which she had at the skilled nursing facility. Also gave her lorazepam 0.5 mg to take for sleep.  Wardell Honour MD - DG Abd 2 Views; Future

## 2015-08-20 ENCOUNTER — Encounter: Payer: Self-pay | Admitting: Nurse Practitioner

## 2015-08-20 ENCOUNTER — Ambulatory Visit (INDEPENDENT_AMBULATORY_CARE_PROVIDER_SITE_OTHER): Payer: Commercial Managed Care - HMO | Admitting: Nurse Practitioner

## 2015-08-20 VITALS — BP 97/61 | HR 86 | Temp 99.4°F | Ht 61.0 in | Wt 129.6 lb

## 2015-08-20 DIAGNOSIS — N3000 Acute cystitis without hematuria: Secondary | ICD-10-CM | POA: Diagnosis not present

## 2015-08-20 DIAGNOSIS — R300 Dysuria: Secondary | ICD-10-CM

## 2015-08-20 LAB — URINALYSIS
Bilirubin, UA: NEGATIVE
GLUCOSE, UA: NEGATIVE
Nitrite, UA: POSITIVE — AB
RBC, UA: NEGATIVE
Specific Gravity, UA: 1.025 (ref 1.005–1.030)
UUROB: 0.2 mg/dL (ref 0.2–1.0)
pH, UA: 5.5 (ref 5.0–7.5)

## 2015-08-20 MED ORDER — CIPROFLOXACIN HCL 500 MG PO TABS: 500.0000 mg | ORAL_TABLET | Freq: Two times a day (BID) | ORAL | Status: DC

## 2015-08-20 NOTE — Patient Instructions (Signed)
Asymptomatic Bacteriuria, Female Asymptomatic bacteriuria is the presence of a large number of bacteria in your urine without the usual symptoms of burning or frequent urination. The following conditions increase the risk of asymptomatic bacteriuria:  Diabetes mellitus.  Advanced age.  Pregnancy in the first trimester.  Kidney stones.  Kidney transplants.  Leaky kidney tube valve in young children (reflux). Treatment for this condition is not needed in most people and can lead to other problems such as too much yeast and growth of resistant bacteria. However, some people, such as pregnant women, do need treatment to prevent kidney infection. Asymptomatic bacteriuria in pregnancy is also associated with fetal growth restriction, premature labor, and newborn death. HOME CARE INSTRUCTIONS Monitor your condition for any changes. The following actions may help to relieve any discomfort you are feeling:  Drink enough water and fluids to keep your urine clear or pale yellow. Go to the bathroom more often to keep your bladder empty.  Keep the area around your vagina and rectum clean. Wipe yourself from front to back after urinating. SEEK IMMEDIATE MEDICAL CARE IF:  You develop signs of an infection such as:  Burning with urination.  Frequency of voiding.  Back pain.  Fever.  You have blood in the urine.  You develop a fever. MAKE SURE YOU:  Understand these instructions.  Will watch your condition.  Will get help right away if you are not doing well or get worse.   This information is not intended to replace advice given to you by your health care provider. Make sure you discuss any questions you have with your health care provider.   Document Released: 04/18/2005 Document Revised: 05/09/2014 Document Reviewed: 10/08/2012 Elsevier Interactive Patient Education 2016 [employer]  

## 2015-08-20 NOTE — Progress Notes (Signed)
  Subjective:    Alexa Key is a 65 y.o. female who complains of burning with urination, dysuria, frequency, hesitancy, nocturia and urgency. She has had symptoms for 4 days. Patient also complains of back pain. Patient denies fever and vaginal discharge. Patient does have a history of recurrent UTI. Patient does not have a history of pyelonephritis.   The following portions of the patient's history were reviewed and updated as appropriate: allergies, current medications, past family history, past medical history, past social history, past surgical history and problem list.  Review of Systems Pertinent items are noted in HPI.    Objective:    General appearance: alert and cooperative Lungs: clear to auscultation bilaterally Heart: regular rate and rhythm, S1, S2 normal, no murmur, click, rub or gallop Abdomen: soft, non-tender; bowel sounds normal; no masses,  no organomegaly and no CVA tenderness bil  Laboratory:  Urine dipstick: 1+ for ketones and 2+ for nitrites.   Micro exam: not done.    Assessment:    Acute cystitis     Plan:   Take medication as prescribe Cotton underwear Take shower not bath Cranberry juice, yogurt Force fluids AZO over the counter X2 days Culture pending RTO prn  Mary-Margaret Hassell Done, FNP

## 2015-08-23 LAB — URINE CULTURE

## 2015-08-24 ENCOUNTER — Other Ambulatory Visit: Payer: Self-pay | Admitting: Nurse Practitioner

## 2015-08-24 MED ORDER — NITROFURANTOIN MONOHYD MACRO 100 MG PO CAPS: 100.0000 mg | ORAL_CAPSULE | Freq: Two times a day (BID) | ORAL | Status: DC

## 2015-09-30 ENCOUNTER — Other Ambulatory Visit: Payer: Self-pay | Admitting: Family Medicine

## 2015-10-29 ENCOUNTER — Other Ambulatory Visit: Payer: Self-pay | Admitting: Family Medicine

## 2015-11-06 ENCOUNTER — Telehealth: Payer: Self-pay | Admitting: Family Medicine

## 2015-11-06 DIAGNOSIS — E785 Hyperlipidemia, unspecified: Secondary | ICD-10-CM

## 2015-11-06 MED ORDER — PRAVASTATIN SODIUM 80 MG PO TABS: 80.0000 mg | ORAL_TABLET | Freq: Every day | ORAL | Status: DC

## 2015-11-06 NOTE — Telephone Encounter (Signed)
Pt needed refill of Pravastatin Rx sent into mail order Pt will come in for 4 mth follow up

## 2015-12-28 ENCOUNTER — Other Ambulatory Visit: Payer: Self-pay | Admitting: Family Medicine

## 2015-12-29 NOTE — Telephone Encounter (Signed)
Patient aware that we have not seen rx.

## 2015-12-30 ENCOUNTER — Telehealth: Payer: Self-pay | Admitting: Family Medicine

## 2015-12-30 NOTE — Telephone Encounter (Signed)
TC to pt - she states that her [employer] will pay for a new meter, strips, lancets, alcohol wipes. She does not have a dx of diabetes or diabetic medication. Her A1C in Jan was 5.8. She has been checking her sugars for years d/t a medication she was on that had increased it then. She usually checks it about once a day. The last two days, once was 160 other day was 240. She has about 7 test strips left.

## 2015-12-30 NOTE — Telephone Encounter (Signed)
Okay to send to pharmacy as requested. If glucose is running over 125 fasting or over 200 random she need sto be seen. WS

## 2016-01-08 ENCOUNTER — Ambulatory Visit: Payer: Commercial Managed Care - HMO | Admitting: Family Medicine

## 2016-01-12 ENCOUNTER — Encounter: Payer: Self-pay | Admitting: Family Medicine

## 2016-01-12 ENCOUNTER — Ambulatory Visit (INDEPENDENT_AMBULATORY_CARE_PROVIDER_SITE_OTHER): Payer: Commercial Managed Care - HMO | Admitting: Family Medicine

## 2016-01-12 VITALS — BP 113/63 | HR 49 | Temp 97.1°F | Ht 61.0 in | Wt 131.0 lb

## 2016-01-12 DIAGNOSIS — E119 Type 2 diabetes mellitus without complications: Secondary | ICD-10-CM

## 2016-01-12 DIAGNOSIS — I10 Essential (primary) hypertension: Secondary | ICD-10-CM | POA: Diagnosis not present

## 2016-01-12 DIAGNOSIS — E785 Hyperlipidemia, unspecified: Secondary | ICD-10-CM | POA: Diagnosis not present

## 2016-01-12 DIAGNOSIS — I25119 Atherosclerotic heart disease of native coronary artery with unspecified angina pectoris: Secondary | ICD-10-CM | POA: Diagnosis not present

## 2016-01-12 LAB — URINALYSIS
BILIRUBIN UA: NEGATIVE
GLUCOSE, UA: NEGATIVE
Ketones, UA: NEGATIVE
LEUKOCYTES UA: NEGATIVE
Nitrite, UA: NEGATIVE
PH UA: 6 (ref 5.0–7.5)
RBC, UA: NEGATIVE
Specific Gravity, UA: 1.02 (ref 1.005–1.030)
Urobilinogen, Ur: 0.2 mg/dL (ref 0.2–1.0)

## 2016-01-12 LAB — BAYER DCA HB A1C WAIVED: HB A1C: 5.3 % (ref <7.0)

## 2016-01-12 MED ORDER — BLOOD GLUCOSE MONITOR KIT: PACK | 0 refills | Status: DC

## 2016-01-12 MED ORDER — DULOXETINE HCL 30 MG PO CPEP: 30.0000 mg | ORAL_CAPSULE | Freq: Every day | ORAL | 0 refills | Status: DC

## 2016-01-12 MED ORDER — METOPROLOL SUCCINATE ER 25 MG PO TB24: 25.0000 mg | ORAL_TABLET | Freq: Every day | ORAL | 3 refills | Status: DC

## 2016-01-12 NOTE — Progress Notes (Signed)
Subjective:  Patient ID: Alexa Key, female    DOB: [birth year]  Age: 65 y.o. MRN: [id number]  CC: Hypertension (pt here for routine follow up HTN, cholesterol)   HPI Alexa Key presents for  follow-up of hypertension. Patient has no history of headache chest pain or shortness of breath or recent cough. Patient also denies symptoms of TIA such as numbness weakness lateralizing. Patient checks  blood pressure at home and has not had any elevated readings recently. Patient denies side effects from his medication. States taking it regularly.  Patient also  in for follow-up of elevated cholesterol. Doing well without complaints on current medication. Denies side effects of statin including myalgia and arthralgia and nausea. Also in today for liver function testing. Currently no chest pain, shortness of breath or other cardiovascular related symptoms noted.  Follow-up of diabetes. Patient does check blood sugar at home. Readings run between 95-110 Fasting and 228 PP Patient denies symptoms such as polyuria, polydipsia, excessive hunger, nausea No significant hypoglycemic spells noted. Medications as noted below. Off all DM meds.  Still smoking. "Gotta quit."   Gets feeling like heart will bust open. Like something bad will happen. Uses Lorazepam . None recently. Willing to switch.  Depression screen [facility] 2/9 01/12/2016 02/02/2015 12/22/2014 11/25/2014 11/17/2014  Decreased Interest 1 0 0 0 0  Down, Depressed, Hopeless 1 0 0 0 0  PHQ - 2 Score 2 0 0 0 0  Altered sleeping 1 - - - -  Tired, decreased energy 0 - - - -  Change in appetite 0 - - - -  Feeling bad or failure about yourself  0 - - - -  Trouble concentrating 0 - - - -  Moving slowly or fidgety/restless 0 - - - -  Suicidal thoughts 0 - - - -  PHQ-9 Score 3 - - - -  Difficult doing work/chores Not difficult at all - - - -   No flowsheet data found.     History Alexa Key has a past medical history of Abnormal cardiac function test  (09/22/2014); Anxiety; Asthma; CAD (coronary artery disease); COPD (chronic obstructive pulmonary disease) ([place]); CVA (cerebral infarction); Diabetes mellitus without complication ([place]); Diverticulitis (08/04/12); Fibromyalgia; GERD (gastroesophageal reflux disease); H/O echocardiogram; HLD (hyperlipidemia); HTN (hypertension); PONV (postoperative nausea and vomiting); PVD (peripheral vascular disease) ([place]); S/P partial lobectomy of lung (1997); Stroke [facility]); Subclavian steal syndrome (2004); and Tobacco abuse.   She has a past surgical history that includes Hysterectomoy; Lumbar spine surgery; Carpal tunnel release; Coronary angioplasty; coronary stents ; partial lobectomy of lung  (1998); Abdominal hysterectomy; Back surgery; Nephrolithotomy (Right, 12/24/2012); Flexible sigmoidoscopy (07/11/2014); laparotomy (N/A, 10/02/2014); Cardiac catheterization (N/A, 10/15/2014); Colonoscopy (N/A, 01/29/2015); Colostomy closure (N/A, 06/02/2015); and Partial colectomy (N/A, 06/02/2015).   Her family history includes Kidney disease in her mother.She reports that she quit smoking about 15 months ago. Her smoking use included Cigarettes. She has a 12.25 pack-year smoking history. She has never used smokeless tobacco. She reports that she does not drink alcohol or use drugs.  Current Outpatient Prescriptions on File Prior to Visit  Medication Sig Dispense Refill  . acetaminophen (TYLENOL) 325 MG tablet Take 2 tablets (650 mg total) by mouth every 6 (six) hours as needed (fever > 101). 30 tablet 1  . albuterol (PROVENTIL HFA;VENTOLIN HFA) 108 (90 BASE) MCG/ACT inhaler Inhale 2 puffs into the lungs every 6 (six) hours as needed for wheezing or shortness of breath. 1 Inhaler 6  . amLODipine (NORVASC) 10  MG tablet Take 1 tablet (10 mg total) by mouth daily. 90 tablet 1  . aspirin 325 MG tablet Take 325 mg by mouth daily.    . budesonide-formoterol (SYMBICORT) 160-4.5 MCG/ACT inhaler Inhale 2 puffs into the lungs 2 (two) times  daily. 1 Inhaler 12  . feeding supplement (BOOST / RESOURCE BREEZE) LIQD Take 1 Container by mouth 3 (three) times daily between meals. 30 Container 0  . isosorbide mononitrate (IMDUR) 30 MG 24 hr tablet TAKE 1 TABLET EVERY DAY 90 tablet 0  . omeprazole (PRILOSEC) 20 MG capsule TAKE 1 CAPSULE EVERY DAY 90 capsule 0  . ondansetron (ZOFRAN) 4 MG tablet Take 1 tablet (4 mg total) by mouth every 6 (six) hours as needed for nausea. 20 tablet 0  . pravastatin (PRAVACHOL) 80 MG tablet Take 1 tablet (80 mg total) by mouth at bedtime. 90 tablet 0  . tiotropium (SPIRIVA) 18 MCG inhalation capsule Place 1 capsule (18 mcg total) into inhaler and inhale daily. 30 capsule 12  . tiZANidine (ZANAFLEX) 2 MG tablet Take 1 tablet (2 mg total) by mouth every 6 (six) hours as needed. 30 tablet 0   No current facility-administered medications on file prior to visit.     ROS Review of Systems  Constitutional: Negative for activity change, appetite change and fever.  HENT: Negative for congestion, rhinorrhea and sore throat.   Eyes: Negative for visual disturbance.  Respiratory: Negative for cough and shortness of breath.   Cardiovascular: Negative for chest pain and palpitations.  Gastrointestinal: Negative for abdominal pain, diarrhea and nausea.  Genitourinary: Negative for dysuria.  Musculoskeletal: Negative for arthralgias and myalgias.  Psychiatric/Behavioral: Positive for agitation and dysphoric mood. The patient is nervous/anxious.     Objective:  BP 113/63   Pulse (!) 49   Temp 97.1 F (36.2 C) (Oral)   Ht '5\' 1"'  (1.549 m)   Wt 131 lb (59.4 kg)   BMI 24.75 kg/m   BP Readings from Last 3 Encounters:  01/12/16 113/63  08/20/15 97/61  07/31/15 (!) 166/77    Wt Readings from Last 3 Encounters:  01/12/16 131 lb (59.4 kg)  08/20/15 129 lb 9.6 oz (58.8 kg)  07/31/15 127 lb (57.6 kg)     Physical Exam  Constitutional: She is oriented to person, place, and time. She appears well-developed and  well-nourished. No distress.  HENT:  Head: Normocephalic and atraumatic.  Right Ear: External ear normal.  Left Ear: External ear normal.  Nose: Nose normal.  Mouth/Throat: Oropharynx is clear and moist.  Eyes: Conjunctivae and EOM are normal. Pupils are equal, round, and reactive to light.  Neck: Normal range of motion. Neck supple. No thyromegaly present.  Cardiovascular: Normal rate, regular rhythm and normal heart sounds.   No murmur heard. Pulmonary/Chest: Effort normal and breath sounds normal. No respiratory distress. She has no wheezes. She has no rales.  Abdominal: Soft. Bowel sounds are normal. She exhibits no distension. There is no tenderness.  Lymphadenopathy:    She has no cervical adenopathy.  Neurological: She is alert and oriented to person, place, and time. She has normal reflexes.  Skin: Skin is warm and dry.  Psychiatric: She has a normal mood and affect. Her behavior is normal. Thought content normal.    Lab Results  Component Value Date   HGBA1C 5.8 (H) 06/02/2015    Lab Results  Component Value Date   WBC 12.1 (H) 06/25/2015   HGB 11.3 (L) 06/25/2015   HCT 36.8 06/25/2015  PLT 379 06/25/2015   GLUCOSE 104 (H) 06/25/2015   CHOL 155 12/22/2014   TRIG 188 (H) 12/22/2014   HDL 36 (L) 12/22/2014   LDLCALC 81 12/22/2014   ALT 9 (L) 06/12/2015   AST 14 (L) 06/12/2015   NA 143 06/25/2015   K 5.2 (H) 06/25/2015   CL 110 06/25/2015   CREATININE 1.24 (H) 06/25/2015   BUN 16 06/25/2015   CO2 26 06/25/2015   TSH 1.920 12/22/2014   INR 1.12 09/30/2014   HGBA1C 5.8 (H) 06/02/2015    No results found.  Assessment & Plan:   Alexa Key was seen today for hypertension.  Diagnoses and all orders for this visit:  Essential hypertension -     CMP14+EGFR  HLD (hyperlipidemia) -     Lipid panel -     CMP14+EGFR  Coronary artery disease involving native coronary artery of native heart with angina pectoris ([place]) -     CMP14+EGFR  Type 2 diabetes mellitus  without complication, without long-term current use of insulin ([facility]) -     Microalbumin / creatinine urine ratio -     Urinalysis -     Bayer DCA Hb A1c Waived -     Lipid panel -     CMP14+EGFR  Other orders -     DULoxetine (CYMBALTA) 30 MG capsule; Take 1 capsule (30 mg total) by mouth daily. For one week then two daily. Take with a full stomach at suppertime -     metoprolol succinate (TOPROL-XL) 25 MG 24 hr tablet; Take 1 tablet (25 mg total) by mouth daily. -     blood glucose meter kit and supplies KIT; Dispense based on patient and insurance preference. Use up to four times daily as directed. (FOR ICD-9 250.00, 250.01).   I have discontinued Ms. Napoleon's metoprolol tartrate, loperamide, traMADol, LORazepam, ciprofloxacin, and nitrofurantoin (macrocrystal-monohydrate). I am also having her start on DULoxetine, metoprolol succinate, and blood glucose meter kit and supplies. Additionally, I am having her maintain her aspirin, budesonide-formoterol, albuterol, tiZANidine, tiotropium, amLODipine, acetaminophen, feeding supplement, ondansetron, isosorbide mononitrate, omeprazole, and pravastatin.  Meds ordered this encounter  Medications  . DULoxetine (CYMBALTA) 30 MG capsule    Sig: Take 1 capsule (30 mg total) by mouth daily. For one week then two daily. Take with a full stomach at suppertime    Dispense:  60 capsule    Refill:  0  . metoprolol succinate (TOPROL-XL) 25 MG 24 hr tablet    Sig: Take 1 tablet (25 mg total) by mouth daily.    Dispense:  90 tablet    Refill:  3  . blood glucose meter kit and supplies KIT    Sig: Dispense based on patient and insurance preference. Use up to four times daily as directed. (FOR ICD-9 250.00, 250.01).    Dispense:  1 each    Refill:  0    Order Specific Question:   Number of strips    Answer:   200    Order Specific Question:   Number of lancets    Answer:   180     Follow-up: Return in about 3 months (around 04/12/2016).  [name], M.D.

## 2016-01-13 LAB — CMP14+EGFR
ALBUMIN: 3.5 g/dL — AB (ref 3.6–4.8)
ALK PHOS: 113 IU/L (ref 39–117)
ALT: 4 IU/L (ref 0–32)
AST: 7 IU/L (ref 0–40)
Albumin/Globulin Ratio: 1.3 (ref 1.2–2.2)
BILIRUBIN TOTAL: 0.3 mg/dL (ref 0.0–1.2)
BUN/Creatinine Ratio: 12 (ref 12–28)
BUN: 16 mg/dL (ref 8–27)
CHLORIDE: 110 mmol/L — AB (ref 96–106)
CO2: 22 mmol/L (ref 18–29)
CREATININE: 1.36 mg/dL — AB (ref 0.57–1.00)
Calcium: 8.9 mg/dL (ref 8.7–10.3)
GFR calc Af Amer: 46 mL/min/{1.73_m2} — ABNORMAL LOW (ref >59)
GFR calc non Af Amer: 40 mL/min/{1.73_m2} — ABNORMAL LOW (ref >59)
GLUCOSE: 91 mg/dL (ref 65–99)
Globulin, Total: 2.8 g/dL (ref 1.5–4.5)
Potassium: 4.7 mmol/L (ref 3.5–5.2)
Sodium: 147 mmol/L — ABNORMAL HIGH (ref 134–144)
Total Protein: 6.3 g/dL (ref 6.0–8.5)

## 2016-01-13 LAB — MICROALBUMIN / CREATININE URINE RATIO
CREATININE, UR: 90.3 mg/dL
MICROALB/CREAT RATIO: 344.7 mg/g creat — ABNORMAL HIGH (ref 0.0–30.0)
MICROALBUM., U, RANDOM: 311.3 ug/mL

## 2016-01-13 LAB — LIPID PANEL
CHOL/HDL RATIO: 3.2 ratio (ref 0.0–4.4)
Cholesterol, Total: 138 mg/dL (ref 100–199)
HDL: 43 mg/dL (ref >39)
LDL CALC: 65 mg/dL (ref 0–99)
Triglycerides: 149 mg/dL (ref 0–149)
VLDL CHOLESTEROL CAL: 30 mg/dL (ref 5–40)

## 2016-01-15 ENCOUNTER — Telehealth: Payer: Self-pay | Admitting: Family Medicine

## 2016-01-15 NOTE — Telephone Encounter (Signed)
Patient aware of results.

## 2016-01-16 ENCOUNTER — Other Ambulatory Visit: Payer: Self-pay | Admitting: Family Medicine

## 2016-02-01 ENCOUNTER — Telehealth: Payer: Self-pay | Admitting: Family Medicine

## 2016-02-01 DIAGNOSIS — I10 Essential (primary) hypertension: Secondary | ICD-10-CM

## 2016-02-01 MED ORDER — AMLODIPINE BESYLATE 10 MG PO TABS: 10.0000 mg | ORAL_TABLET | Freq: Every day | ORAL | 0 refills | Status: DC

## 2016-02-01 NOTE — Telephone Encounter (Signed)
Refill BP med per protocol

## 2016-02-22 ENCOUNTER — Other Ambulatory Visit: Payer: Self-pay | Admitting: Family Medicine

## 2016-02-22 DIAGNOSIS — E785 Hyperlipidemia, unspecified: Secondary | ICD-10-CM

## 2016-03-17 ENCOUNTER — Ambulatory Visit (INDEPENDENT_AMBULATORY_CARE_PROVIDER_SITE_OTHER): Payer: Commercial Managed Care - HMO

## 2016-03-17 ENCOUNTER — Ambulatory Visit (INDEPENDENT_AMBULATORY_CARE_PROVIDER_SITE_OTHER): Payer: Commercial Managed Care - HMO | Admitting: Nurse Practitioner

## 2016-03-17 ENCOUNTER — Encounter: Payer: Self-pay | Admitting: Nurse Practitioner

## 2016-03-17 VITALS — BP 121/63 | HR 63 | Temp 97.4°F | Ht 61.0 in | Wt 132.0 lb

## 2016-03-17 DIAGNOSIS — R05 Cough: Secondary | ICD-10-CM | POA: Diagnosis not present

## 2016-03-17 DIAGNOSIS — J189 Pneumonia, unspecified organism: Secondary | ICD-10-CM

## 2016-03-17 DIAGNOSIS — R059 Cough, unspecified: Secondary | ICD-10-CM

## 2016-03-17 DIAGNOSIS — J181 Lobar pneumonia, unspecified organism: Secondary | ICD-10-CM | POA: Diagnosis not present

## 2016-03-17 MED ORDER — BENZONATATE 100 MG PO CAPS: 100.0000 mg | ORAL_CAPSULE | Freq: Three times a day (TID) | ORAL | 0 refills | Status: DC | PRN

## 2016-03-17 MED ORDER — AZITHROMYCIN 500 MG PO TABS: ORAL_TABLET | ORAL | 0 refills | Status: DC

## 2016-03-17 NOTE — Progress Notes (Signed)
Subjective:     Alexa Key is a 65 y.o. female here for evaluation of a cough. Onset of symptoms was 2 week ago. Symptoms have been unchanged since that time. The cough is harsh and productive and is aggravated by nothing. Associated symptoms include: change in voice, fever, shortness of breath and sputum production. Patient does not have a history of asthma. Patient does have a history of environmental allergens. Patient has not traveled recently. Patient does have a history of smoking. Patient has had a previous chest x-ray. Patient has had a PPD done.  The following portions of the patient's history were reviewed and updated as appropriate: allergies, current medications, past family history, past medical history, past social history, past surgical history and problem list.  Review of Systems Pertinent items noted in HPI and remainder of comprehensive ROS otherwise negative.    Objective:     BP 121/63   Pulse 63   Temp 97.4 F (36.3 C) (Oral)   Ht 5\' 1"  (1.549 m)   Wt 132 lb (59.9 kg)   BMI 24.94 kg/m  General appearance: alert and cooperative Eyes: conjunctivae/corneas clear. PERRL, EOM's intact. Fundi benign. Ears: normal TM's and external ear canals both ears Nose: clear discharge, mild congestion, turbinates red, no sinus tenderness Throat: lips, mucosa, and tongue normal; teeth and gums normal Neck: no adenopathy, no carotid bruit, no JVD, supple, symmetrical, trachea midline and thyroid not enlarged, symmetric, no tenderness/mass/nodules Lungs: diminished breath sounds RLL and RML Heart: regular rate and rhythm, S1, S2 normal, no murmur, click, rub or gallop    Chest x ray- right lower lobe effusion-Preliminary reading by Ronnald Collum, FNP  [facility]  Assessment:    Pneumonia    Plan:   1. Take meds as prescribed 2. Use a cool mist humidifier especially during the winter months and when heat has been humid. 3. Use saline nose sprays frequently 4. Saline irrigations of  the nose can be very helpful if done frequently.  * 4X daily for 1 week*  * Use of a nettie pot can be helpful with this. Follow directions with this* 5. Drink plenty of fluids 6. Keep thermostat turn down low 7.For any cough or congestion  Use plain Mucinex- regular strength or max strength is fine   * Children- consult with Pharmacist for dosing 8. For fever or aces or pains- take tylenol or ibuprofen appropriate for age and weight.  * for fevers greater than 101 orally you may alternate ibuprofen and tylenol every  3 hours.   Meds ordered this encounter  Medications  . azithromycin (ZITHROMAX) 500 MG tablet    Sig: As directed    Dispense:  6 tablet    Refill:  0    Order Specific Question:   Supervising Provider    Answer:   VINCENT, CAROL L [[id number]]  . benzonatate (TESSALON PERLES) 100 MG capsule    Sig: Take 1 capsule (100 mg total) by mouth 3 (three) times daily as needed for cough.    Dispense:  20 capsule    Refill:  0    Order Specific Question:   Supervising Provider    Answer:   Eustaquio Maize Keys, FNP

## 2016-03-17 NOTE — Patient Instructions (Signed)
Community-Acquired Pneumonia, Adult °Introduction °Pneumonia is an infection of the lungs. One type of pneumonia can happen while a person is in a hospital. A different type can happen when a person is not in a hospital (community-acquired pneumonia). It is easy for this kind to spread from person to person. It can spread to you if you breathe near an infected person who coughs or sneezes. Some symptoms include: °· A dry cough. °· A wet (productive) cough. °· Fever. °· Sweating. °· Chest pain. °Follow these instructions at home: °· Take over-the-counter and prescription medicines only as told by your doctor. °¨ Only take cough medicine if you are losing sleep. °¨ If you were prescribed an antibiotic medicine, take it as told by your doctor. Do not stop taking the antibiotic even if you start to feel better. °· Sleep with your head and neck raised (elevated). You can do this by putting a few pillows under your head, or you can sleep in a recliner. °· Do not use tobacco products. These include cigarettes, chewing tobacco, and e-cigarettes. If you need help quitting, ask your doctor. °· Drink enough water to keep your pee (urine) clear or pale yellow. °A shot (vaccine) can help prevent pneumonia. Shots are often suggested for: °· People older than 67 years of age. °· People older than 67 years of age: °¨ Who are having cancer treatment. °¨ Who have long-term (chronic) lung disease. °¨ Who have problems with their body's defense system (immune system). °You may also prevent pneumonia if you take these actions: °· Get the flu (influenza) shot every year. °· Go to the dentist as often as told. °· Wash your hands often. If soap and water are not available, use hand sanitizer. °Contact a doctor if: °· You have a fever. °· You lose sleep because your cough medicine does not help. °Get help right away if: °· You are short of breath and it gets worse. °· You have more chest pain. °· Your sickness gets worse. This is very  serious if: °¨ You are an older adult. °¨ Your body's defense system is weak. °· You cough up blood. °This information is not intended to replace advice given to you by your health care provider. Make sure you discuss any questions you have with your health care provider. °Document Released: 10/05/2007 Document Revised: 09/24/2015 Document Reviewed: 08/13/2014 °© 2017 [employer]vier ° °

## 2016-03-23 IMAGING — CT CT ABD-PELV W/O CM
2 of 4 series · 15 of 46 positions shown, 17 images · non-contrast
Comparison: Plain film examination 10/07/2014. Preoperative CT
09/29/2014.

CLINICAL DATA: 64-year-old female 10 days post exploratory
laparotomy with sigmoid colectomy, colovaginal fistula takedown and
colostomy formation. Sigmoid colon stricture noted at the time of
surgery. Nausea with vomiting phlegm. Subsequent encounter.

EXAM:
CT ABDOMEN AND PELVIS WITHOUT CONTRAST
TECHNIQUE: Multidetector CT imaging of the abdomen and pelvis was performed
following the standard protocol without IV contrast.

[Series 2: abd/ pelvis 5.0 i30f 1 · axial · 0.92mm/px · z∈[-784,-384]mm · 12 of 88 slices shown, 14 images]
[im 4/88  soft-tissue]
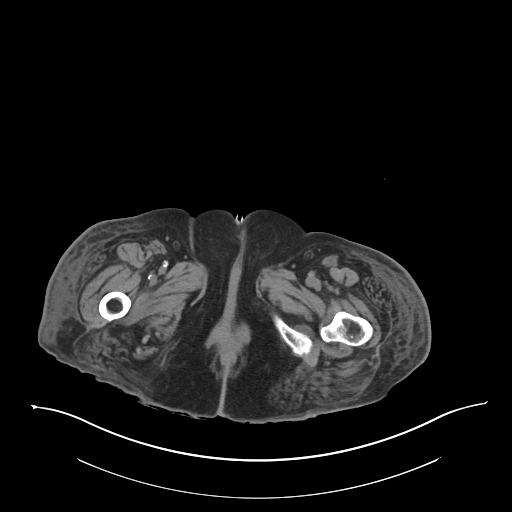
[im 4/88  bone]
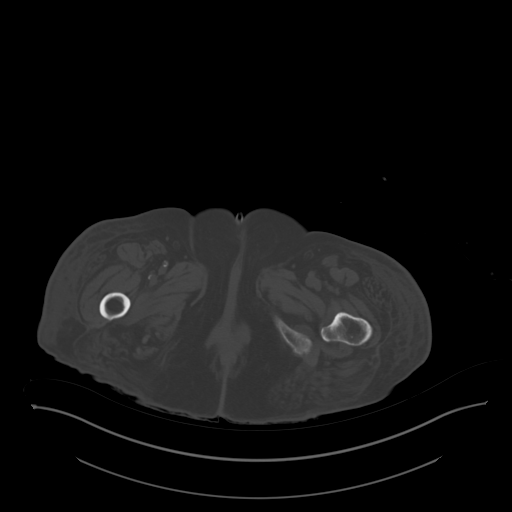
[im 12/88  soft-tissue]
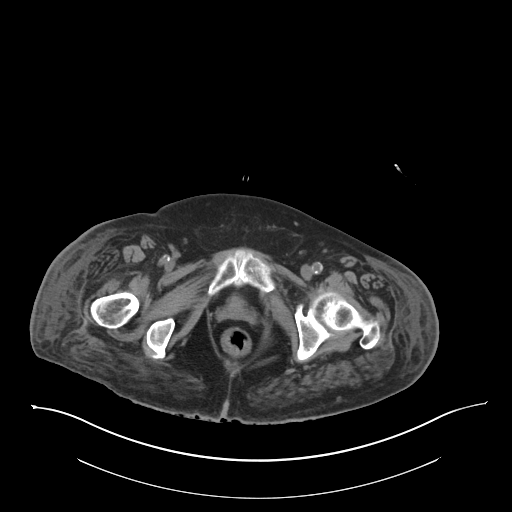
[im 19/88  soft-tissue]
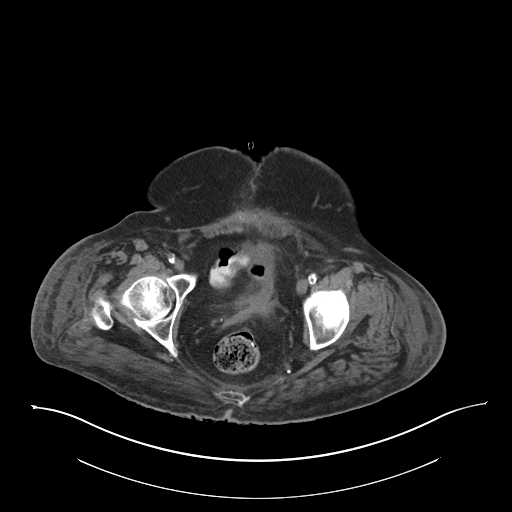
[im 27/88  soft-tissue]
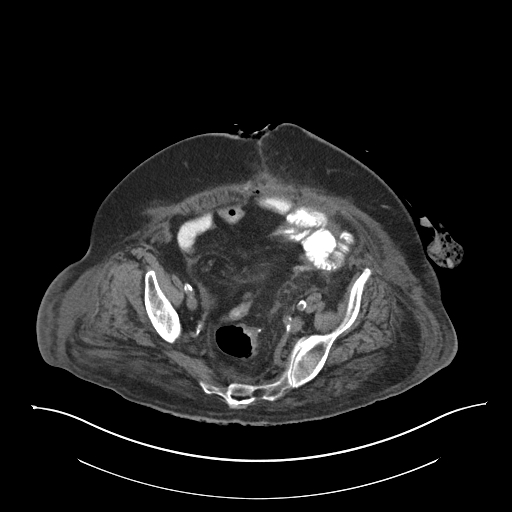
[im 35/88  soft-tissue]
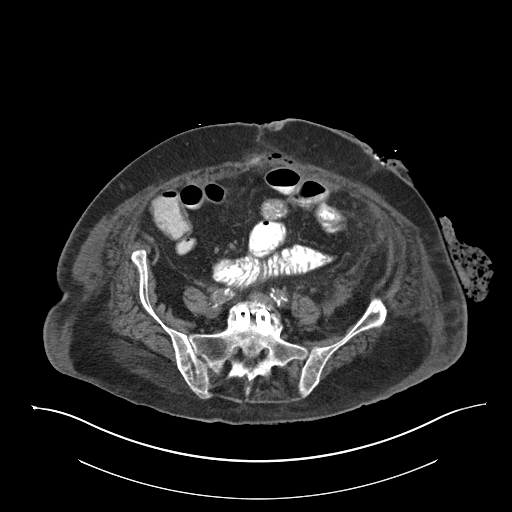
[im 42/88  soft-tissue]
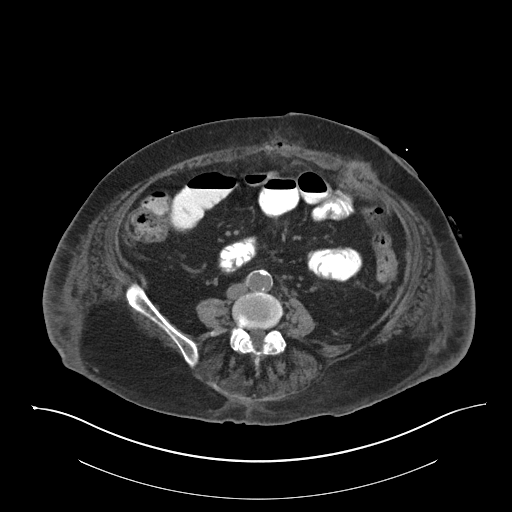
[im 46/88  soft-tissue]
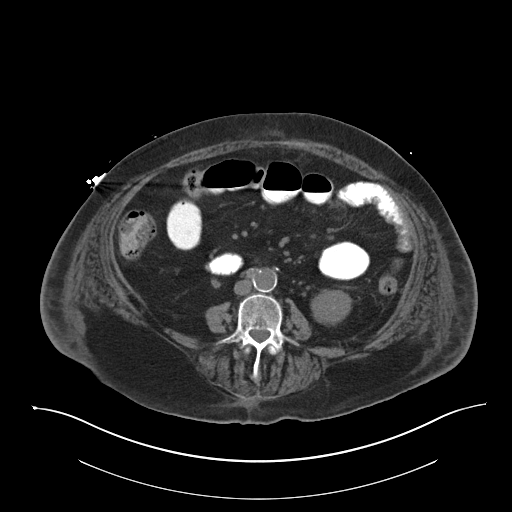
[im 53/88  soft-tissue]
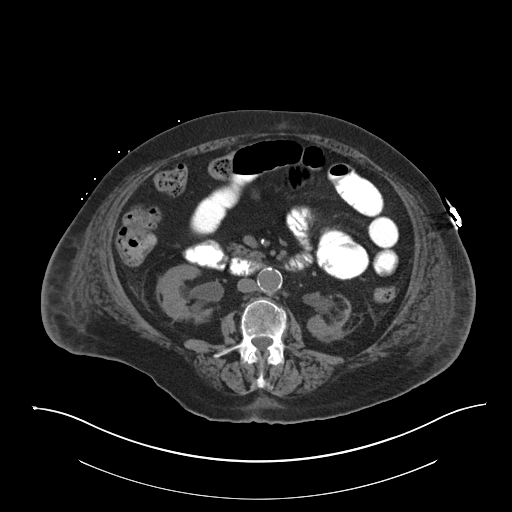
[im 61/88  soft-tissue]
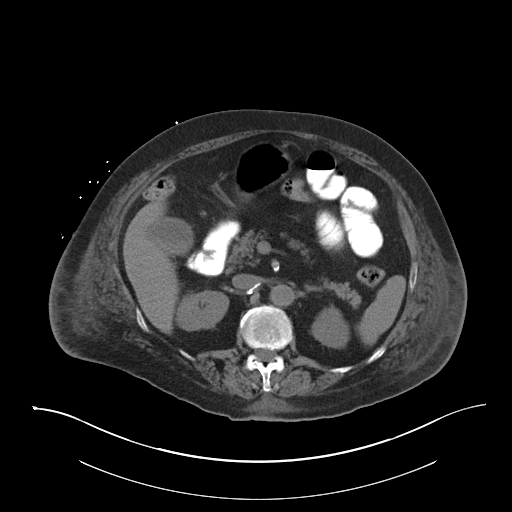
[im 61/88  bone]
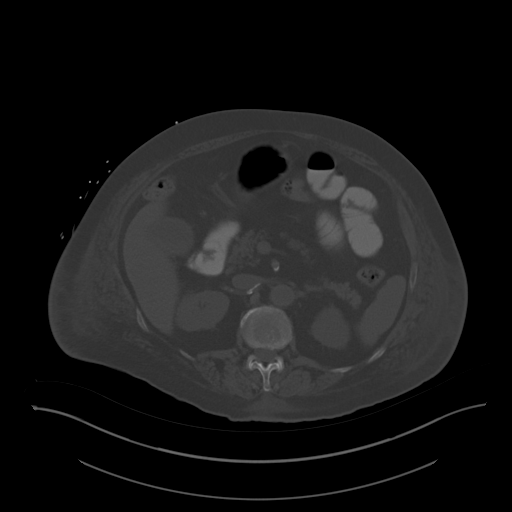
[im 69/88  soft-tissue]
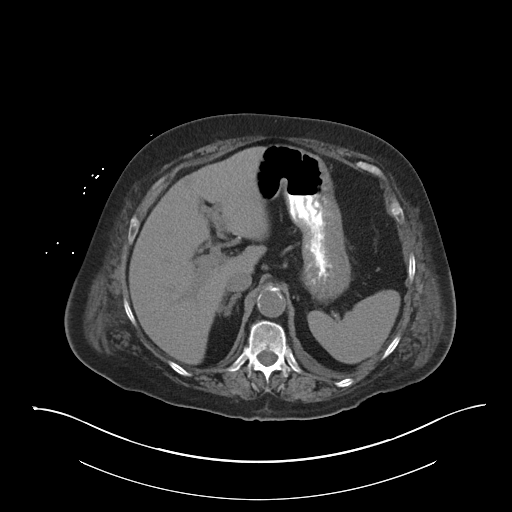
[im 76/88  soft-tissue]
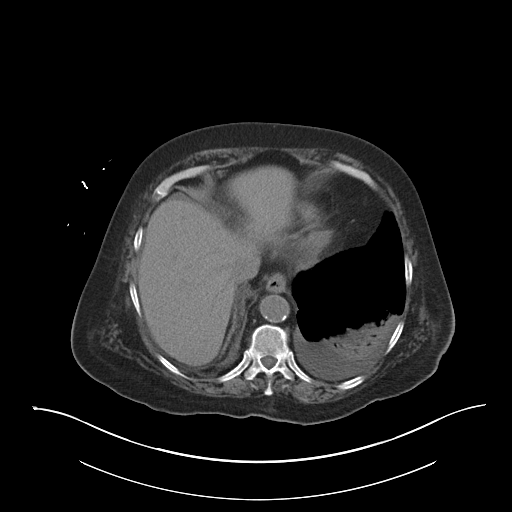
[im 84/88  soft-tissue]
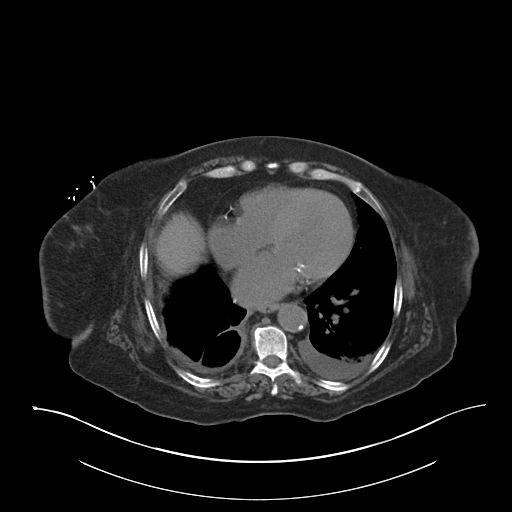

[Series 5: coronals · coronal · 0.83mm/px · 3 of 145 slices shown]
[im 49/145  soft-tissue]
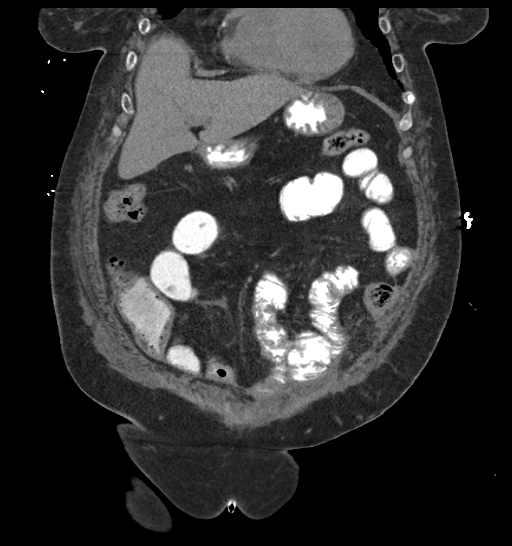
[im 65/145  soft-tissue]
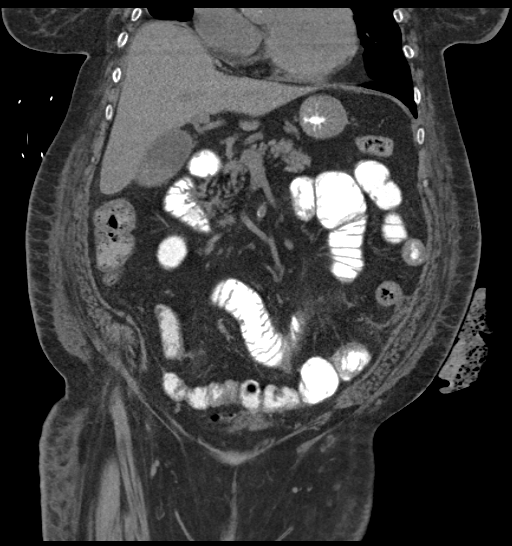
[im 81/145  soft-tissue]
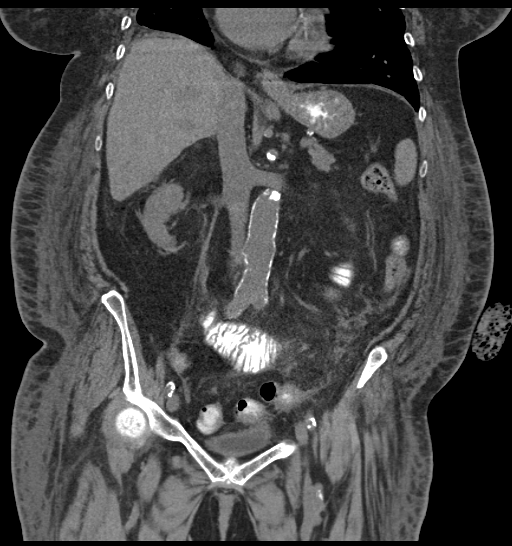

[15 of 46 positions shown; findings below may reference images not displayed]

FINDINGS: Post resection portion of the descending colon and sigmoid colon.
Rectosigmoid pouch without complication. Left lower abdominal
colostomy site without complication.

Subcutaneous edema most notable buttock and upper thigh region
greater on the right may represent dependent edema or third spacing
of fluid.

Anterior abdominal/pelvic wall incision with gas and mild
infiltration of fat planes at the base of the incision site which
may represent residua of healing by secondary intent without
well-defined drainable abscess.

Contrast and partially gas-filled prominent size loops of small
bowel with change of caliber in the left lower quadrant. This may
reflect result of adhesions and mild partial obstruction. The small
bowel loops extend towards the base of the incision site but do not
appear to enter such.

Basilar subsegmental atelectasis with small left-sided pleural
effusion.

Heart size top-normal coronary artery calcifications and aortic root
calcifications.

Atherosclerotic type changes of the abdominal aorta with focal
aneurysm arising from the left lateral wall of the infrarenal aorta.
Ectasia right common iliac artery. Narrowing of the left common
iliac artery.

Gallstones/gallbladder sludge.

Taking into account limitation by non contrast imaging, no worrisome
hepatic, splenic, pancreatic or adrenal lesion.

Scarring of the kidneys greater on the left. Fullness of the renal
collecting systems without calcified obstructing stone identified.
Left renal nonobstructing calculi and/or vascular calcifications
unchanged. Left renal sub cm hyperdense lesions possibly hemorrhagic
cysts although incompletely assessed.

Partially contracted noncontrast filled urinary bladder without
obvious abnormality.

Postsurgical changes L5-S1.
IMPRESSION: Post resection portion of the descending colon and sigmoid colon.
Rectosigmoid pouch without complication. Left lower abdominal
colostomy site without complication.

Anterior abdominal/pelvic wall incision with gas and mild
infiltration of fat planes at the base of the incision site which
may represent residua of healing by secondary intent without
well-defined drainable abscess.

Contrast and partially gas-filled prominent size loops of small
bowel with change of caliber in the left lower quadrant. This may
reflect result of adhesions and mild partial obstruction. The small
bowel loops extend towards the base of the incision site but do not
appear to enter such.

Basilar subsegmental atelectasis with small left-sided pleural
effusion.

Heart size top-normal coronary artery calcifications and aortic root
calcifications.

Atherosclerotic type changes of the abdominal aorta with focal
aneurysm arising from the left lateral wall of the infrarenal aorta
without change.

Gallstones/gallbladder sludge.

Scarring of the kidneys greater on the left. Fullness of the renal
collecting systems without calcified obstructing stone identified.

Dependent edema/third spacing of fluid.

## 2016-04-06 ENCOUNTER — Other Ambulatory Visit: Payer: Self-pay | Admitting: Family Medicine

## 2016-04-06 DIAGNOSIS — I10 Essential (primary) hypertension: Secondary | ICD-10-CM

## 2016-05-10 ENCOUNTER — Ambulatory Visit (INDEPENDENT_AMBULATORY_CARE_PROVIDER_SITE_OTHER): Payer: Medicare HMO | Admitting: Family

## 2016-05-10 ENCOUNTER — Encounter: Payer: Self-pay | Admitting: Family

## 2016-05-10 VITALS — BP 162/92 | HR 56 | Temp 98.4°F | Wt 135.5 lb

## 2016-05-10 DIAGNOSIS — J209 Acute bronchitis, unspecified: Secondary | ICD-10-CM

## 2016-05-10 MED ORDER — AZITHROMYCIN 250 MG PO TABS: ORAL_TABLET | ORAL | 0 refills | Status: DC

## 2016-05-10 MED ORDER — BENZONATATE 200 MG PO CAPS: 200.0000 mg | ORAL_CAPSULE | Freq: Three times a day (TID) | ORAL | 1 refills | Status: DC | PRN

## 2016-05-10 NOTE — Patient Instructions (Signed)

## 2016-05-10 NOTE — Progress Notes (Addendum)
   Subjective:    Patient ID: Alexa Key, female    DOB: 09/29/1950, 66 y.o.   MRN: [id number]  Cough  This is a new problem. The current episode started 1 to 4 weeks ago. The problem has been gradually worsening. The problem occurs every few minutes. The cough is productive of sputum. Associated symptoms include chills, nasal congestion, postnasal drip, rhinorrhea and wheezing. Pertinent negatives include no ear congestion, ear pain, fever, headaches, myalgias or sore throat. The symptoms are aggravated by lying down. Risk factors for lung disease include smoking/tobacco exposure. She has tried rest and OTC cough suppressant for the symptoms. The treatment provided mild relief.      Review of Systems  Constitutional: Positive for chills. Negative for fever.  HENT: Positive for postnasal drip and rhinorrhea. Negative for ear pain and sore throat.   Respiratory: Positive for cough and wheezing.   Musculoskeletal: Negative for myalgias.  Neurological: Negative for headaches.  All other systems reviewed and are negative.      Objective:   Physical Exam  Constitutional: She is oriented to person, place, and time. She appears well-developed and well-nourished. No distress.  HENT:  Head: Normocephalic and atraumatic.  Right Ear: External ear normal.  Left Ear: External ear normal.  Nose: Mucosal edema and rhinorrhea present.  Mouth/Throat: Posterior oropharyngeal erythema present.  Eyes: Pupils are equal, round, and reactive to light.  Neck: Normal range of motion. Neck supple. No thyromegaly present.  Cardiovascular: Normal rate, regular rhythm, normal heart sounds and intact distal pulses.   No murmur heard. Pulmonary/Chest: Effort normal. No respiratory distress. She has decreased breath sounds. She has no wheezes.  Abdominal: Soft. Bowel sounds are normal. She exhibits no distension. There is no tenderness.  Musculoskeletal: Normal range of motion. She exhibits no edema or  tenderness.  Neurological: She is alert and oriented to person, place, and time. She has normal reflexes. No cranial nerve deficit.  Skin: Skin is warm and dry.  Psychiatric: She has a normal mood and affect. Her behavior is normal. Judgment and thought content normal.  Vitals reviewed.     BP (!) 162/92   Pulse (!) 56   Temp 98.4 F (36.9 C) (Oral)   Wt 135 lb 8 oz (61.5 kg)   BMI 25.60 kg/m      Assessment & Plan:  1. Acute bronchitis, unspecified organism - Take meds as prescribed - Use a cool mist humidifier  -Use saline nose sprays frequently -Saline irrigations of the nose can be very helpful if done frequently.  * 4X daily for 1 week*  * Use of a nettie pot can be helpful with this. Follow directions with this* -Force fluids -For any cough or congestion  Use plain Mucinex- regular strength or max strength is fine   * Children- consult with Pharmacist for dosing -For fever or aces or pains- take tylenol or ibuprofen appropriate for age and weight.  * for fevers greater than 101 orally you may alternate ibuprofen and tylenol every  3 hours. -Throat lozenges if help - azithromycin (ZITHROMAX) 250 MG tablet; Take 500 mg once, then 250 mg for four days  Dispense: 6 tablet; Refill: 0 - benzonatate (TESSALON) 200 MG capsule; Take 1 capsule (200 mg total) by mouth 3 (three) times daily as needed.  Dispense: 30 capsule; Refill: 1  Stop decongestant!!!  Evelina Dun, FNP

## 2016-05-23 ENCOUNTER — Other Ambulatory Visit: Payer: Self-pay | Admitting: Family Medicine

## 2016-05-23 DIAGNOSIS — E7800 Pure hypercholesterolemia, unspecified: Secondary | ICD-10-CM

## 2016-05-23 DIAGNOSIS — I10 Essential (primary) hypertension: Secondary | ICD-10-CM

## 2016-05-23 MED ORDER — DULOXETINE HCL 30 MG PO CPEP: 30.0000 mg | ORAL_CAPSULE | Freq: Every day | ORAL | 0 refills | Status: DC

## 2016-05-23 MED ORDER — METOPROLOL SUCCINATE ER 25 MG PO TB24: 25.0000 mg | ORAL_TABLET | Freq: Every day | ORAL | 0 refills | Status: DC

## 2016-05-23 MED ORDER — OMEPRAZOLE 20 MG PO CPDR: 20.0000 mg | DELAYED_RELEASE_CAPSULE | Freq: Every day | ORAL | 0 refills | Status: DC

## 2016-05-23 MED ORDER — PRAVASTATIN SODIUM 80 MG PO TABS: ORAL_TABLET | ORAL | 0 refills | Status: DC

## 2016-05-23 MED ORDER — ISOSORBIDE MONONITRATE ER 30 MG PO TB24: 30.0000 mg | ORAL_TABLET | Freq: Every day | ORAL | 0 refills | Status: DC

## 2016-05-23 MED ORDER — AMLODIPINE BESYLATE 10 MG PO TABS: 10.0000 mg | ORAL_TABLET | Freq: Every day | ORAL | 0 refills | Status: DC

## 2016-05-23 NOTE — Telephone Encounter (Signed)
Spoke with pt - refills done

## 2016-05-24 ENCOUNTER — Ambulatory Visit (INDEPENDENT_AMBULATORY_CARE_PROVIDER_SITE_OTHER): Payer: Medicare HMO | Admitting: Family Medicine

## 2016-05-24 ENCOUNTER — Other Ambulatory Visit: Payer: Self-pay | Admitting: Family Medicine

## 2016-05-24 ENCOUNTER — Encounter: Payer: Self-pay | Admitting: Family Medicine

## 2016-05-24 ENCOUNTER — Ambulatory Visit (HOSPITAL_COMMUNITY)
Admission: RE | Admit: 2016-05-24 | Discharge: 2016-05-24 | Disposition: A | Discharge status: Home / Self Care | Payer: Medicare HMO | Source: Ambulatory Visit | Attending: Family Medicine | Admitting: Family Medicine

## 2016-05-24 VITALS — BP 138/74 | HR 55 | Temp 97.3°F | Ht 61.0 in | Wt 138.0 lb

## 2016-05-24 DIAGNOSIS — K8020 Calculus of gallbladder without cholecystitis without obstruction: Secondary | ICD-10-CM | POA: Insufficient documentation

## 2016-05-24 DIAGNOSIS — I714 Abdominal aortic aneurysm, without rupture: Secondary | ICD-10-CM | POA: Insufficient documentation

## 2016-05-24 DIAGNOSIS — I2510 Atherosclerotic heart disease of native coronary artery without angina pectoris: Secondary | ICD-10-CM | POA: Diagnosis not present

## 2016-05-24 DIAGNOSIS — R109 Unspecified abdominal pain: Secondary | ICD-10-CM | POA: Diagnosis not present

## 2016-05-24 DIAGNOSIS — I700 Atherosclerosis of aorta: Secondary | ICD-10-CM | POA: Insufficient documentation

## 2016-05-24 DIAGNOSIS — R399 Unspecified symptoms and signs involving the genitourinary system: Secondary | ICD-10-CM

## 2016-05-24 LAB — MICROSCOPIC EXAMINATION: RENAL EPITHEL UA: NONE SEEN /HPF

## 2016-05-24 LAB — URINALYSIS, COMPLETE
Bilirubin, UA: NEGATIVE
Glucose, UA: NEGATIVE
Ketones, UA: NEGATIVE
Leukocytes, UA: NEGATIVE
NITRITE UA: NEGATIVE
PH UA: 5.5 (ref 5.0–7.5)
Specific Gravity, UA: 1.025 (ref 1.005–1.030)
Urobilinogen, Ur: 0.2 mg/dL (ref 0.2–1.0)

## 2016-05-24 MED ORDER — CIPROFLOXACIN HCL 500 MG PO TABS: 500.0000 mg | ORAL_TABLET | Freq: Two times a day (BID) | ORAL | 0 refills | Status: DC

## 2016-05-24 MED ORDER — HYDROCODONE-ACETAMINOPHEN 5-325 MG PO TABS: 1.0000 | ORAL_TABLET | Freq: Four times a day (QID) | ORAL | 0 refills | Status: DC | PRN

## 2016-05-24 NOTE — Progress Notes (Signed)
Subjective:  Patient ID: Alexa Key, female    DOB: 1950/11/29  Age: 65 y.o. MRN: [id number]  CC: Flank Pain (pt here today c/o flank pain and abdominal pressure when she uses the bathroom since Saturday night)   HPI Alexa Key presents for Symptoms noted above. The right flank has marked sharp stabbing pain that is 7-8/10 intermittently for 3 days. There has been some lower right quadrant abdominal pressure as well. She has had some urinary frequency but no dysuria. The pain does not go to the left flank. She has a history of kidney stones requiring stent placement 2 years ago. She is concerned that there is another stone.   History Alexa Key has a past medical history of Abnormal cardiac function test (09/22/2014); Anxiety; Asthma; CAD (coronary artery disease); COPD (chronic obstructive pulmonary disease) ([place]); CVA (cerebral infarction); Diabetes mellitus without complication ([place]); Diverticulitis (08/04/12); Fibromyalgia; GERD (gastroesophageal reflux disease); H/O echocardiogram; HLD (hyperlipidemia); HTN (hypertension); PONV (postoperative nausea and vomiting); PVD (peripheral vascular disease) ([place]); S/P partial lobectomy of lung (1997); Stroke [facility]); Subclavian steal syndrome (2004); and Tobacco abuse.   She has a past surgical history that includes Hysterectomoy; Lumbar spine surgery; Carpal tunnel release; Coronary angioplasty; coronary stents ; partial lobectomy of lung  (1998); Abdominal hysterectomy; Back surgery; Nephrolithotomy (Right, 12/24/2012); Flexible sigmoidoscopy (07/11/2014); laparotomy (N/A, 10/02/2014); Cardiac catheterization (N/A, 10/15/2014); Colonoscopy (N/A, 01/29/2015); Colostomy closure (N/A, 06/02/2015); and Partial colectomy (N/A, 06/02/2015).   Her family history includes Kidney disease in her mother.She reports that she quit smoking about 19 months ago. Her smoking use included Cigarettes. She has a 12.25 pack-year smoking history. She has never used smokeless tobacco.  She reports that she does not drink alcohol or use drugs.    ROS Review of Systems  Constitutional: Negative for activity change, appetite change and fever.  HENT: Negative for congestion, rhinorrhea and sore throat.   Eyes: Negative for visual disturbance.  Respiratory: Negative for cough and shortness of breath.   Cardiovascular: Negative for chest pain and palpitations.  Gastrointestinal: Negative for abdominal pain, diarrhea and nausea.  Genitourinary: Negative for dysuria.  Musculoskeletal: Negative for arthralgias and myalgias.    Objective:  BP 138/74   Pulse (!) 55   Temp 97.3 F (36.3 C) (Oral)   Ht 5\' 1"  (1.549 m)   Wt 138 lb (62.6 kg)   BMI 26.07 kg/m   BP Readings from Last 3 Encounters:  05/24/16 138/74  05/10/16 (!) 162/92  03/17/16 121/63    Wt Readings from Last 3 Encounters:  05/24/16 138 lb (62.6 kg)  05/10/16 135 lb 8 oz (61.5 kg)  03/17/16 132 lb (59.9 kg)     Physical Exam  Constitutional: She is oriented to person, place, and time. She appears well-developed and well-nourished.  HENT:  Head: Normocephalic and atraumatic.  Cardiovascular: Normal rate and regular rhythm.   No murmur heard. Pulmonary/Chest: Effort normal and breath sounds normal.  Abdominal: Soft. Bowel sounds are normal. She exhibits no mass. There is tenderness (right CVA). There is no rebound and no guarding.  Musculoskeletal: Normal range of motion.  Neurological: She is alert and oriented to person, place, and time.  Skin: Skin is warm and dry.  Psychiatric: She has a normal mood and affect. Her behavior is normal.      Assessment & Plan:   1. UTI symptoms   2. Rt flank pain     Meds ordered this encounter  Medications  . HYDROcodone-acetaminophen (NORCO/VICODIN) 5-325 MG tablet  Sig: Take 1 tablet by mouth every 6 (six) hours as needed for moderate pain.    Dispense:  30 tablet    Refill:  0    Orders Placed This Encounter  Procedures  . Microscopic  Examination  . CT RENAL STONE STUDY    Standing Status:   Future    Standing Expiration Date:   08/22/2017    Order Specific Question:   Reason for Exam (SYMPTOM  OR DIAGNOSIS REQUIRED)    Answer:   renal colic    Order Specific Question:   Preferred imaging location?    Answer:   Internal  . CT RENAL STONE STUDY    Standing Status:   Future    Number of Occurrences:   1    Standing Expiration Date:   08/22/2017    Order Specific Question:   Reason for Exam (SYMPTOM  OR DIAGNOSIS REQUIRED)    Answer:   flank pain    Order Specific Question:   Preferred imaging location?    Answer:   [facility]    Order Specific Question:   Call Results- Best Contact Number?    Answer:   [phone] Do not hold patient  . Urinalysis, Complete    Labs pending Health Maintenance reviewed Diet and exercise encouraged Continue all meds as discussed Follow up in 1 month & PRN  [name], MD    CT CT urogram report was read as negative for stones. However some calcified coronary arteries were noted as well as a 2 cm abdominal aortic aneurysm.   Follow-up: Return in about 1 month (around 06/24/2016), or if symptoms worsen or fail to improve.  [name], M.D.

## 2016-05-26 ENCOUNTER — Telehealth: Payer: Self-pay | Admitting: Family Medicine

## 2016-05-26 NOTE — Telephone Encounter (Signed)
Pt's daughter aware no kidney stones present.

## 2016-05-26 NOTE — Progress Notes (Signed)
Patient aware.

## 2016-05-26 NOTE — Telephone Encounter (Signed)
Please contact the patient . Some vascular findings were noted. They need eventual attention, but not immediate. She has a very small aneurysm that just needs to be watched with ultrasound in 6 mos. It doesn't require a specialist. They will do nothing with it unless it gets much larger. There are some calcium deposits in the blood vessels. Controlling BP & cholesterol is the best way to handle that.

## 2016-08-11 NOTE — Telephone Encounter (Signed)
Pt's daughter is aware 

## 2016-08-24 ENCOUNTER — Other Ambulatory Visit: Payer: Self-pay | Admitting: Family Medicine

## 2016-08-26 ENCOUNTER — Telehealth: Payer: Self-pay | Admitting: Family Medicine

## 2016-09-01 ENCOUNTER — Encounter: Payer: Self-pay | Admitting: Pediatrics

## 2016-09-01 ENCOUNTER — Ambulatory Visit: Payer: Medicare HMO | Admitting: Family Medicine

## 2016-09-01 ENCOUNTER — Ambulatory Visit (INDEPENDENT_AMBULATORY_CARE_PROVIDER_SITE_OTHER): Payer: Medicare HMO | Admitting: Pediatrics

## 2016-09-01 ENCOUNTER — Other Ambulatory Visit: Payer: Self-pay | Admitting: Family Medicine

## 2016-09-01 VITALS — BP 192/78 | HR 89 | Temp 100.2°F | Ht 61.0 in | Wt 143.8 lb

## 2016-09-01 DIAGNOSIS — I10 Essential (primary) hypertension: Secondary | ICD-10-CM

## 2016-09-01 DIAGNOSIS — N183 Chronic kidney disease, stage 3 unspecified: Secondary | ICD-10-CM

## 2016-09-01 DIAGNOSIS — J441 Chronic obstructive pulmonary disease with (acute) exacerbation: Secondary | ICD-10-CM

## 2016-09-01 MED ORDER — UMECLIDINIUM BROMIDE 62.5 MCG/INH IN AEPB: 1.0000 | INHALATION_SPRAY | Freq: Every day | RESPIRATORY_TRACT | 11 refills | Status: DC

## 2016-09-01 MED ORDER — PREDNISONE 20 MG PO TABS: ORAL_TABLET | ORAL | 0 refills | Status: DC

## 2016-09-01 MED ORDER — LEVOFLOXACIN 250 MG PO TABS
ORAL_TABLET | ORAL | 0 refills | Status: DC
Start: 2016-09-01 — End: 2016-09-19

## 2016-09-01 NOTE — Progress Notes (Signed)
  Subjective:   Patient ID: AFIFA TRUAX, female    DOB: March 18, 1951, 66 y.o.   MRN: [id number] CC: chest congestion and Fever  HPI: KELLEY POLINSKY is a 66 y.o. female presenting for chest congestion and Fever  Symptoms started two days ago Coughing up more than usual Feeling slightly more SOB Subjective fever at home Appetite slightly down Is not sure if she took her BP med today or not, usually does in the morning Does have BP cuff at home she can check, hasnt been checking regularly recently No CP, no HA  Appetite is down  h/o renal insufficiency, Cr most recently 1.3  Relevant past medical, surgical, family and social history reviewed. Allergies and medications reviewed and updated. History  Smoking Status  . Former Smoker  . Packs/day: 0.25  . Years: 49.00  . Types: Cigarettes  . Quit date: 09/29/2014  Smokeless Tobacco  . Never Used   ROS: Per HPI   Objective:    BP (!) 192/78 (BP Location: Left Arm, Patient Position: Sitting, Cuff Size: Normal)   Pulse 89   Temp 100.2 F (37.9 C) (Oral)   Ht 5\' 1"  (1.549 m)   Wt 143 lb 12.8 oz (65.2 kg)   SpO2 94%   BMI 27.17 kg/m   Wt Readings from Last 3 Encounters:  09/01/16 143 lb 12.8 oz (65.2 kg)  05/24/16 138 lb (62.6 kg)  05/10/16 135 lb 8 oz (61.5 kg)    Gen: NAD, alert, cooperative with exam, NCAT EYES: EOMI, no conjunctival injection, or no icterus ENT:  TMs pearly gray b/l, OP with mild erythema LYMPH: no cervical LAD CV: NRRR, normal S1/S2, no murmur, distal pulses 2+ b/l Resp: exp wheezing present throughout Comfortable WOB, moving air fair Abd: +BS, soft, NTND.  Ext: No edema, warm Neuro: Alert and oriented, strength equal b/l UE and LE, coordination grossly normal MSK: normal muscle bulk  Assessment & Plan:  Vyolet was seen today for chest congestion and fever.  Diagnoses and all orders for this visit:  COPD exacerbation ([place]) Treat with below, return precautions discussed -     predniSONE  (DELTASONE) 20 MG tablet; 2 po at same time daily for 5 days -     levofloxacin (LEVAQUIN) 250 MG tablet; Take two tabs first day, one daily for day 2-7  Essential hypertension Elevated SBP thinks she missed meds this morning, asymptomatic Will go home and check BP and meds If remains elevated let me know  CKD (chronic kidney disease) stage 3, GFR 30-59 ml/min Renally dosed levoquin  Follow up plan: Return in about 2 weeks (around 09/15/2016) for PCP follow up. Assunta Found, MD [place]

## 2016-09-19 ENCOUNTER — Encounter: Payer: Self-pay | Admitting: Family Medicine

## 2016-09-19 ENCOUNTER — Ambulatory Visit (INDEPENDENT_AMBULATORY_CARE_PROVIDER_SITE_OTHER): Payer: Medicare HMO | Admitting: Family Medicine

## 2016-09-19 VITALS — BP 126/64 | HR 65 | Temp 99.2°F | Ht 61.0 in | Wt 141.0 lb

## 2016-09-19 DIAGNOSIS — I10 Essential (primary) hypertension: Secondary | ICD-10-CM | POA: Diagnosis not present

## 2016-09-19 DIAGNOSIS — J42 Unspecified chronic bronchitis: Secondary | ICD-10-CM | POA: Diagnosis not present

## 2016-09-19 DIAGNOSIS — E785 Hyperlipidemia, unspecified: Secondary | ICD-10-CM | POA: Diagnosis not present

## 2016-09-19 DIAGNOSIS — J441 Chronic obstructive pulmonary disease with (acute) exacerbation: Secondary | ICD-10-CM

## 2016-09-19 DIAGNOSIS — I2510 Atherosclerotic heart disease of native coronary artery without angina pectoris: Secondary | ICD-10-CM | POA: Diagnosis not present

## 2016-09-19 DIAGNOSIS — Z9861 Coronary angioplasty status: Secondary | ICD-10-CM

## 2016-09-19 DIAGNOSIS — K219 Gastro-esophageal reflux disease without esophagitis: Secondary | ICD-10-CM | POA: Diagnosis not present

## 2016-09-19 DIAGNOSIS — R0602 Shortness of breath: Secondary | ICD-10-CM | POA: Diagnosis not present

## 2016-09-19 DIAGNOSIS — N183 Chronic kidney disease, stage 3 unspecified: Secondary | ICD-10-CM

## 2016-09-19 MED ORDER — METOPROLOL SUCCINATE ER 25 MG PO TB24: 12.5000 mg | ORAL_TABLET | Freq: Every day | ORAL | 0 refills | Status: DC

## 2016-09-19 MED ORDER — FLUTICASONE FUROATE-VILANTEROL 200-25 MCG/INH IN AEPB: 1.0000 | INHALATION_SPRAY | Freq: Every day | RESPIRATORY_TRACT | 11 refills | Status: DC

## 2016-09-19 MED ORDER — FLUTICASONE FUROATE-VILANTEROL 200-25 MCG/INH IN AEPB: 1.0000 | INHALATION_SPRAY | Freq: Every day | RESPIRATORY_TRACT | Status: DC

## 2016-09-19 NOTE — Progress Notes (Signed)
Subjective:  Patient ID: Alexa Key, female    DOB: [birth year]  Age: 66 y.o. MRN: [id number]  CC: Hypertension (pt here today for routine follow up of her chronic medical conditions as well as needing medication refills.)   HPI MANVIR THORSON presents for Follow-up of her COPD. She is having a lot of shortness of breath. She's been unable to get her Symbicort due to insurance restrictions. She recently had to go off her Spiriva for the same reason. She has been able to get the substitute though. She says she gets short of breath just walking about 100 feet. She denies chest pain. She is able to lay down without dyspnea. She has chronic back pain and in fact laying down is the only thing that really helps that.   follow-up of hypertension. Patient has no history of headache chest pain or shortness of breath or recent cough. Patient also denies symptoms of TIA such as numbness weakness lateralizing.  Patient denies side effects from his medication. States taking it regularly.   Patient in for follow-up of elevated cholesterol. Doing well without complaints on current medication. Denies side effects of statin including myalgia and arthralgia and nausea. Also in today for liver function testing. Currently no chest pain, shortness of breath or other cardiovascular related symptoms noted.  History Stephene has a past medical history of Abnormal cardiac function test (09/22/2014); Anxiety; Asthma; CAD (coronary artery disease); COPD (chronic obstructive pulmonary disease) ([place]); CVA (cerebral infarction); Diabetes mellitus without complication ([place]); Diverticulitis (08/04/12); Fibromyalgia; GERD (gastroesophageal reflux disease); H/O echocardiogram; HLD (hyperlipidemia); HTN (hypertension); PONV (postoperative nausea and vomiting); PVD (peripheral vascular disease) ([place]); S/P partial lobectomy of lung (1997); Stroke [facility]); Subclavian steal syndrome (2004); and Tobacco abuse.   She has a past surgical  history that includes Hysterectomoy; Lumbar spine surgery; Carpal tunnel release; Coronary angioplasty; coronary stents ; partial lobectomy of lung  (1998); Abdominal hysterectomy; Back surgery; Nephrolithotomy (Right, 12/24/2012); Flexible sigmoidoscopy (07/11/2014); laparotomy (N/A, 10/02/2014); Cardiac catheterization (N/A, 10/15/2014); Colonoscopy (N/A, 01/29/2015); Colostomy closure (N/A, 06/02/2015); and Partial colectomy (N/A, 06/02/2015).   Her family history includes Kidney disease in her mother.She reports that she quit smoking about 1 years ago. Her smoking use included Cigarettes. She has a 12.25 pack-year smoking history. She has never used smokeless tobacco. She reports that she does not drink alcohol or use drugs.  Current Outpatient Prescriptions on File Prior to Visit  Medication Sig Dispense Refill  . acetaminophen (TYLENOL) 325 MG tablet Take 2 tablets (650 mg total) by mouth every 6 (six) hours as needed (fever > 101). 30 tablet 1  . albuterol (PROVENTIL HFA;VENTOLIN HFA) 108 (90 BASE) MCG/ACT inhaler Inhale 2 puffs into the lungs every 6 (six) hours as needed for wheezing or shortness of breath. 1 Inhaler 6  . amLODipine (NORVASC) 10 MG tablet Take 1 tablet (10 mg total) by mouth daily. 90 tablet 0  . aspirin 325 MG tablet Take 325 mg by mouth daily.    Marland Kitchen HYDROcodone-acetaminophen (NORCO/VICODIN) 5-325 MG tablet Take 1 tablet by mouth every 6 (six) hours as needed for moderate pain. 30 tablet 0  . isosorbide mononitrate (IMDUR) 30 MG 24 hr tablet TAKE 1 TABLET DAILY (NEED  OFFICE VISIT) 90 tablet 0  . omeprazole (PRILOSEC) 20 MG capsule Take 1 capsule (20 mg total) by mouth daily. 90 capsule 0  . pravastatin (PRAVACHOL) 80 MG tablet TAKE 1 TABLET (80 MG TOTAL) AT BEDTIME. 90 tablet 0  . umeclidinium bromide (INCRUSE ELLIPTA) 62.5  MCG/INH AEPB Inhale 1 puff into the lungs daily. 1 each 11  . budesonide-formoterol (SYMBICORT) 160-4.5 MCG/ACT inhaler Inhale 2 puffs into the lungs 2 (two)  times daily. (Patient not taking: Reported on 09/19/2016) 1 Inhaler 12   No current facility-administered medications on file prior to visit.     ROS Review of Systems  Constitutional: Negative for activity change, appetite change and fever.  HENT: Negative for congestion, rhinorrhea and sore throat.   Eyes: Negative for visual disturbance.  Respiratory: Positive for shortness of breath. Negative for cough.   Cardiovascular: Negative for chest pain and palpitations.  Gastrointestinal: Negative for abdominal pain, diarrhea and nausea.  Genitourinary: Negative for dysuria.  Musculoskeletal: Positive for back pain. Negative for arthralgias and myalgias.    Objective:  BP 126/64   Pulse 65   Temp 99.2 F (37.3 C) (Oral)   Ht '5\' 1"'  (1.549 m)   Wt 141 lb (64 kg)   BMI 26.64 kg/m   Physical Exam  Constitutional: She is oriented to person, place, and time. She appears well-developed and well-nourished. No distress.  HENT:  Head: Normocephalic and atraumatic.  Right Ear: External ear normal.  Left Ear: External ear normal.  Nose: Nose normal.  Mouth/Throat: Oropharynx is clear and moist.  Eyes: Conjunctivae and EOM are normal. Pupils are equal, round, and reactive to light.  Neck: Normal range of motion. Neck supple. No thyromegaly present.  Cardiovascular: Normal rate, regular rhythm and normal heart sounds.   No murmur heard. Pulmonary/Chest: Effort normal and breath sounds normal. No respiratory distress. She has no wheezes. She has no rales.  Abdominal: Soft. Bowel sounds are normal. She exhibits no distension. There is no tenderness.  Lymphadenopathy:    She has no cervical adenopathy.  Neurological: She is alert and oriented to person, place, and time. She has normal reflexes.  Skin: Skin is warm and dry.  Psychiatric: She has a normal mood and affect. Her behavior is normal. Judgment and thought content normal.   pulmonary function shows very severe obstruction. That report  was reviewed and is attached showing 39% predicted for FEV1 to FVC.  Assessment & Plan:   Arlanda was seen today for hypertension.  Diagnoses and all orders for this visit:  Dyslipidemia -     CMP14+EGFR -     Lipid panel  Essential hypertension -     CMP14+EGFR  Chronic bronchitis, unspecified chronic bronchitis type (HCC) -     PR BREATHING CAPACITY TEST  CAD S/P remote PCI (no details) -     CBC with Differential/Platelet  CKD (chronic kidney disease) stage 3, GFR 30-59 ml/min -     CBC with Differential/Platelet  Gastroesophageal reflux disease without esophagitis -     CBC with Differential/Platelet  Shortness of breath -     CBC with Differential/Platelet -     PR BREATHING CAPACITY TEST  COPD exacerbation (HCC) -     CBC with Differential/Platelet -     CMP14+EGFR -     PR BREATHING CAPACITY TEST -     Discontinue: fluticasone furoate-vilanterol (BREO ELLIPTA) 200-25 MCG/INH 1 puff; Inhale 1 puff into the lungs daily.  Other orders -     metoprolol succinate (TOPROL-XL) 25 MG 24 hr tablet; Take 0.5 tablets (12.5 mg total) by mouth daily. -     fluticasone furoate-vilanterol (BREO ELLIPTA) 200-25 MCG/INH AEPB; Inhale 1 puff into the lungs daily.   I have discontinued Ms. Laplante's tiZANidine, predniSONE, and levofloxacin. I have also  changed her metoprolol succinate. Additionally, I am having her start on fluticasone furoate-vilanterol. Lastly, I am having her maintain her aspirin, budesonide-formoterol, albuterol, acetaminophen, pravastatin, omeprazole, amLODipine, HYDROcodone-acetaminophen, isosorbide mononitrate, and umeclidinium bromide.  Meds ordered this encounter  Medications  . metoprolol succinate (TOPROL-XL) 25 MG 24 hr tablet    Sig: Take 0.5 tablets (12.5 mg total) by mouth daily.    Dispense:  90 tablet    Refill:  0  . DISCONTD: fluticasone furoate-vilanterol (BREO ELLIPTA) 200-25 MCG/INH 1 puff  . fluticasone furoate-vilanterol (BREO ELLIPTA)  200-25 MCG/INH AEPB    Sig: Inhale 1 puff into the lungs daily.    Dispense:  1 each    Refill:  11   PFT - severe obstruction  Follow-up: Return in about 3 months (around 12/20/2016).  [name], M.D.

## 2016-09-20 ENCOUNTER — Telehealth: Payer: Self-pay | Admitting: Family Medicine

## 2016-09-20 LAB — CMP14+EGFR
A/G RATIO: 1.3 (ref 1.2–2.2)
ALK PHOS: 104 IU/L (ref 39–117)
ALT: 8 IU/L (ref 0–32)
AST: 11 IU/L (ref 0–40)
Albumin: 3.6 g/dL (ref 3.6–4.8)
BUN / CREAT RATIO: 13 (ref 12–28)
BUN: 18 mg/dL (ref 8–27)
Bilirubin Total: 0.3 mg/dL (ref 0.0–1.2)
CHLORIDE: 104 mmol/L (ref 96–106)
CO2: 26 mmol/L (ref 18–29)
Calcium: 9.2 mg/dL (ref 8.7–10.3)
Creatinine, Ser: 1.42 mg/dL — ABNORMAL HIGH (ref 0.57–1.00)
GFR calc Af Amer: 44 mL/min/{1.73_m2} — ABNORMAL LOW (ref >59)
GFR calc non Af Amer: 38 mL/min/{1.73_m2} — ABNORMAL LOW (ref >59)
GLUCOSE: 113 mg/dL — AB (ref 65–99)
Globulin, Total: 2.8 g/dL (ref 1.5–4.5)
POTASSIUM: 5.3 mmol/L — AB (ref 3.5–5.2)
Sodium: 142 mmol/L (ref 134–144)
Total Protein: 6.4 g/dL (ref 6.0–8.5)

## 2016-09-20 LAB — CBC WITH DIFFERENTIAL/PLATELET
BASOS ABS: 0 10*3/uL (ref 0.0–0.2)
BASOS: 0 %
EOS (ABSOLUTE): 0.2 10*3/uL (ref 0.0–0.4)
Eos: 2 %
Hematocrit: 38 % (ref 34.0–46.6)
Hemoglobin: 12.2 g/dL (ref 11.1–15.9)
Immature Grans (Abs): 0 10*3/uL (ref 0.0–0.1)
Immature Granulocytes: 0 %
LYMPHS ABS: 2.4 10*3/uL (ref 0.7–3.1)
Lymphs: 19 %
MCH: 30.1 pg (ref 26.6–33.0)
MCHC: 32.1 g/dL (ref 31.5–35.7)
MCV: 94 fL (ref 79–97)
MONOS ABS: 0.8 10*3/uL (ref 0.1–0.9)
Monocytes: 6 %
NEUTROS ABS: 9.4 10*3/uL — AB (ref 1.4–7.0)
Neutrophils: 73 %
PLATELETS: 346 10*3/uL (ref 150–379)
RBC: 4.05 x10E6/uL (ref 3.77–5.28)
RDW: 15.5 % — ABNORMAL HIGH (ref 12.3–15.4)
WBC: 12.9 10*3/uL — ABNORMAL HIGH (ref 3.4–10.8)

## 2016-09-20 LAB — LIPID PANEL
CHOLESTEROL TOTAL: 175 mg/dL (ref 100–199)
Chol/HDL Ratio: 4.2 ratio (ref 0.0–4.4)
HDL: 42 mg/dL (ref >39)
LDL Calculated: 100 mg/dL — ABNORMAL HIGH (ref 0–99)
Triglycerides: 164 mg/dL — ABNORMAL HIGH (ref 0–149)
VLDL CHOLESTEROL CAL: 33 mg/dL (ref 5–40)

## 2016-09-20 MED ORDER — FLUTICASONE FUROATE-VILANTEROL 200-25 MCG/INH IN AEPB: 1.0000 | INHALATION_SPRAY | Freq: Every day | RESPIRATORY_TRACT | 11 refills | Status: DC

## 2016-09-20 MED ORDER — METOPROLOL SUCCINATE ER 25 MG PO TB24: 12.5000 mg | ORAL_TABLET | Freq: Every day | ORAL | 0 refills | Status: DC

## 2016-09-20 NOTE — Telephone Encounter (Signed)
Metoprolol and Breo were both sent to [facility] instead of [facility] on 09/19/16.  Contacted [employer] and cancelled prescriptions and resent to [facility].  Patient aware.

## 2016-09-21 ENCOUNTER — Other Ambulatory Visit: Payer: Self-pay | Admitting: Family Medicine

## 2016-10-02 ENCOUNTER — Other Ambulatory Visit: Payer: Self-pay | Admitting: Family Medicine

## 2016-10-03 ENCOUNTER — Telehealth: Payer: Self-pay | Admitting: Family Medicine

## 2016-10-03 MED ORDER — OMEPRAZOLE 20 MG PO CPDR: 20.0000 mg | DELAYED_RELEASE_CAPSULE | Freq: Every day | ORAL | 0 refills | Status: DC

## 2016-10-03 NOTE — Telephone Encounter (Signed)
Med sent into [facility] per pt request Okayed per dr stacks per protocol

## 2016-12-08 ENCOUNTER — Telehealth: Payer: Self-pay | Admitting: *Deleted

## 2016-12-08 MED ORDER — FLUTICASONE FUROATE-VILANTEROL 200-25 MCG/INH IN AEPB: 1.0000 | INHALATION_SPRAY | Freq: Every day | RESPIRATORY_TRACT | 0 refills | Status: DC

## 2016-12-08 MED ORDER — BUDESONIDE-FORMOTEROL FUMARATE 160-4.5 MCG/ACT IN AERO: 2.0000 | INHALATION_SPRAY | Freq: Two times a day (BID) | RESPIRATORY_TRACT | 0 refills | Status: DC

## 2016-12-08 NOTE — Telephone Encounter (Signed)
Pt aware sample are at front desk ready for pickup

## 2016-12-26 ENCOUNTER — Encounter: Payer: Self-pay | Admitting: Family Medicine

## 2016-12-26 ENCOUNTER — Ambulatory Visit (INDEPENDENT_AMBULATORY_CARE_PROVIDER_SITE_OTHER): Payer: Medicare HMO | Admitting: Family Medicine

## 2016-12-26 VITALS — BP 139/69 | HR 67 | Temp 97.9°F | Ht 61.0 in | Wt 147.0 lb

## 2016-12-26 DIAGNOSIS — R062 Wheezing: Secondary | ICD-10-CM | POA: Diagnosis not present

## 2016-12-26 DIAGNOSIS — I10 Essential (primary) hypertension: Secondary | ICD-10-CM | POA: Diagnosis not present

## 2016-12-26 DIAGNOSIS — J42 Unspecified chronic bronchitis: Secondary | ICD-10-CM | POA: Diagnosis not present

## 2016-12-26 DIAGNOSIS — K219 Gastro-esophageal reflux disease without esophagitis: Secondary | ICD-10-CM

## 2016-12-26 DIAGNOSIS — E785 Hyperlipidemia, unspecified: Secondary | ICD-10-CM

## 2016-12-26 MED ORDER — AMLODIPINE BESYLATE 10 MG PO TABS: 10.0000 mg | ORAL_TABLET | Freq: Every day | ORAL | 3 refills | Status: DC

## 2016-12-26 MED ORDER — OMEPRAZOLE 20 MG PO CPDR: 20.0000 mg | DELAYED_RELEASE_CAPSULE | Freq: Every day | ORAL | 11 refills | Status: DC

## 2016-12-26 MED ORDER — ISOSORBIDE MONONITRATE ER 30 MG PO TB24: 30.0000 mg | ORAL_TABLET | Freq: Every day | ORAL | 1 refills | Status: DC

## 2016-12-26 MED ORDER — UMECLIDINIUM BROMIDE 62.5 MCG/INH IN AEPB: 1.0000 | INHALATION_SPRAY | Freq: Every day | RESPIRATORY_TRACT | 11 refills | Status: DC

## 2016-12-26 MED ORDER — ALBUTEROL SULFATE HFA 108 (90 BASE) MCG/ACT IN AERS: 2.0000 | INHALATION_SPRAY | Freq: Four times a day (QID) | RESPIRATORY_TRACT | 6 refills | Status: DC | PRN

## 2016-12-26 NOTE — Progress Notes (Signed)
Subjective:  Patient ID: Alexa Key, female    DOB: 1950/05/22  Age: 66 y.o. MRN: [id number]  CC: Hypertension (pt here today for routine follow up of chronic medical conditions and medication refills)   HPI Alexa Key presents for Follow-up of COPD. She still has quite a bit of trouble breathing. She has trouble affording her inhalers. She states that one of them causes some shortness of breath because of the powder and it. That is the back of her mouth and makes her feel dyspneic. It is unclear whether she is using the Symbicort or the Breo this time. She apparently is using one of the other along with her in [place].   follow-up of hypertension. Patient has no history of headache chest pain or shortness of breath or recent cough. Patient also denies symptoms of TIA such as numbness weakness lateralizing. Patient checks  blood pressure at home and has not had any elevated readings recently. Patient denies side effects from his medication. States taking it regularly. She states that she is having a bit of left upper quadrant pain at the site where her takedown of her colostomy occurred. It's an occasional sharp pain.  Depression screen [facility] 2/9 12/26/2016 09/19/2016 09/01/2016  Decreased Interest 0 0 0  Down, Depressed, Hopeless 0 0 0  PHQ - 2 Score 0 0 0  Altered sleeping - - -  Tired, decreased energy - - -  Change in appetite - - -  Feeling bad or failure about yourself  - - -  Trouble concentrating - - -  Moving slowly or fidgety/restless - - -  Suicidal thoughts - - -  PHQ-9 Score - - -  Difficult doing work/chores - - -    History Alexa Key has a past medical history of Abnormal cardiac function test (09/22/2014); Anxiety; Asthma; CAD (coronary artery disease); COPD (chronic obstructive pulmonary disease) ([place]); CVA (cerebral infarction); Diabetes mellitus without complication ([place]); Diverticulitis (08/04/12); Fibromyalgia; GERD (gastroesophageal reflux disease); H/O echocardiogram; HLD  (hyperlipidemia); HTN (hypertension); PONV (postoperative nausea and vomiting); PVD (peripheral vascular disease) ([place]); S/P partial lobectomy of lung (1997); Stroke Alexa Key); Subclavian steal syndrome (2004); and Tobacco abuse.   She has a past surgical history that includes Hysterectomoy; Lumbar spine surgery; Carpal tunnel release; Coronary angioplasty; coronary stents ; partial lobectomy of lung  (1998); Abdominal hysterectomy; Back surgery; Nephrolithotomy (Right, 12/24/2012); Flexible sigmoidoscopy (07/11/2014); laparotomy (N/A, 10/02/2014); Cardiac catheterization (N/A, 10/15/2014); Colonoscopy (N/A, 01/29/2015); Colostomy closure (N/A, 06/02/2015); and Partial colectomy (N/A, 06/02/2015).   Her family history includes Kidney disease in her mother.She reports that she quit smoking about 2 years ago. Her smoking use included Cigarettes. She has a 12.25 pack-year smoking history. She has never used smokeless tobacco. She reports that she does not drink alcohol or use drugs.    ROS Review of Systems  Constitutional: Negative for activity change, appetite change and fever.  HENT: Negative for congestion, rhinorrhea and sore throat.   Eyes: Negative for visual disturbance.  Respiratory: Positive for cough and shortness of breath. Negative for choking.   Cardiovascular: Negative for chest pain and palpitations.  Gastrointestinal: Negative for abdominal pain, diarrhea and nausea.  Genitourinary: Negative for dysuria.  Musculoskeletal: Negative for arthralgias and myalgias.    Objective:  BP 139/69   Pulse 67   Temp 97.9 F (36.6 C) (Oral)   Ht 5\' 1"  (1.549 m)   Wt 147 lb (66.7 kg)   SpO2 93%   BMI 27.78 kg/m   BP Readings from  Last 3 Encounters:  12/26/16 139/69  09/19/16 126/64  09/01/16 (!) 192/78    Wt Readings from Last 3 Encounters:  12/26/16 147 lb (66.7 kg)  09/19/16 141 lb (64 kg)  09/01/16 143 lb 12.8 oz (65.2 kg)     Physical Exam  Constitutional: She is oriented to  person, place, and time. She appears well-developed and well-nourished. No distress.  HENT:  Head: Normocephalic and atraumatic.  Right Ear: External ear normal.  Left Ear: External ear normal.  Nose: Nose normal.  Mouth/Throat: Oropharynx is clear and moist.  Eyes: Pupils are equal, round, and reactive to light. Conjunctivae and EOM are normal.  Neck: Normal range of motion. Neck supple. No thyromegaly present.  Cardiovascular: Normal rate, regular rhythm and normal heart sounds.   No murmur heard. Pulmonary/Chest: Effort normal. No respiratory distress. She has wheezes. She has no rales. She exhibits no tenderness.  Breath sounds are distant particularly at the right middle to lower field  Abdominal: Soft. Bowel sounds are normal. She exhibits no distension. There is no tenderness.  Lymphadenopathy:    She has no cervical adenopathy.  Neurological: She is alert and oriented to person, place, and time. She has normal reflexes.  Skin: Skin is warm and dry.  Psychiatric: She has a normal mood and affect. Her behavior is normal. Judgment and thought content normal.      Assessment & Plan:   Alexa Key was seen today for hypertension.  Diagnoses and all orders for this visit:  Essential hypertension -     amLODipine (NORVASC) 10 MG tablet; Take 1 tablet (10 mg total) by mouth daily.  Dyslipidemia  Gastroesophageal reflux disease without esophagitis  Chronic bronchitis, unspecified chronic bronchitis type (HCC) -     PR BREATHING CAPACITY TEST  Wheezes -     albuterol (PROVENTIL HFA;VENTOLIN HFA) 108 (90 Base) MCG/ACT inhaler; Inhale 2 puffs into the lungs every 6 (six) hours as needed for wheezing or shortness of breath.  Other orders -     isosorbide mononitrate (IMDUR) 30 MG 24 hr tablet; Take 1 tablet (30 mg total) by mouth daily. -     omeprazole (PRILOSEC) 20 MG capsule; Take 1 capsule (20 mg total) by mouth daily. -     umeclidinium bromide (INCRUSE ELLIPTA) 62.5 MCG/INH  AEPB; Inhale 1 puff into the lungs daily.    PFT confirms very severe obstruction and low vital capacity. The FEV1 to FVC ratio has dropped since last checked 3 months ago.   I have discontinued Ms. Supinski's HYDROcodone-acetaminophen and fluticasone furoate-vilanterol. I am also having her maintain her aspirin, acetaminophen, pravastatin, metoprolol succinate, metoprolol succinate, budesonide-formoterol, albuterol, amLODipine, isosorbide mononitrate, omeprazole, and umeclidinium bromide.    Patient encouraged and regular use of in her inhalers. We'll hold off on oxygen due to a pulse ox reading is 93. However at some point soon and may need to do overnight pulse oximetry at home.  Follow-up: Return in about 3 months (around 03/28/2017).  [name], M.D.

## 2016-12-27 MED ORDER — AMLODIPINE BESYLATE 10 MG PO TABS: 10.0000 mg | ORAL_TABLET | Freq: Every day | ORAL | 3 refills | Status: DC

## 2016-12-27 MED ORDER — ALBUTEROL SULFATE HFA 108 (90 BASE) MCG/ACT IN AERS: 2.0000 | INHALATION_SPRAY | Freq: Four times a day (QID) | RESPIRATORY_TRACT | 6 refills | Status: DC | PRN

## 2016-12-27 MED ORDER — UMECLIDINIUM BROMIDE 62.5 MCG/INH IN AEPB: 1.0000 | INHALATION_SPRAY | Freq: Every day | RESPIRATORY_TRACT | 11 refills | Status: DC

## 2016-12-27 MED ORDER — ISOSORBIDE MONONITRATE ER 30 MG PO TB24
30.0000 mg | ORAL_TABLET | Freq: Every day | ORAL | 1 refills | Status: DC
Start: 2016-12-27 — End: 2017-02-24

## 2016-12-27 MED ORDER — OMEPRAZOLE 20 MG PO CPDR: 20.0000 mg | DELAYED_RELEASE_CAPSULE | Freq: Every day | ORAL | 11 refills | Status: DC

## 2016-12-27 NOTE — Addendum Note (Signed)
Addended by: Antonietta Barcelona D on: 12/27/2016 09:25 AM   Modules accepted: Orders

## 2016-12-27 NOTE — Addendum Note (Signed)
Addended by: Antonietta Barcelona D on: 12/27/2016 09:27 AM   Modules accepted: Orders

## 2016-12-28 MED ORDER — OMEPRAZOLE 20 MG PO CPDR: 20.0000 mg | DELAYED_RELEASE_CAPSULE | Freq: Every day | ORAL | 11 refills | Status: DC

## 2016-12-28 NOTE — Addendum Note (Signed)
Addended by: Antonietta Barcelona D on: 12/28/2016 10:29 AM   Modules accepted: Orders

## 2017-02-24 ENCOUNTER — Encounter: Payer: Self-pay | Admitting: Family Medicine

## 2017-02-24 ENCOUNTER — Ambulatory Visit (INDEPENDENT_AMBULATORY_CARE_PROVIDER_SITE_OTHER): Payer: Medicare HMO | Admitting: Family Medicine

## 2017-02-24 VITALS — BP 118/70 | HR 64 | Temp 97.9°F | Ht 61.0 in | Wt 146.0 lb

## 2017-02-24 DIAGNOSIS — E7800 Pure hypercholesterolemia, unspecified: Secondary | ICD-10-CM | POA: Diagnosis not present

## 2017-02-24 DIAGNOSIS — I10 Essential (primary) hypertension: Secondary | ICD-10-CM | POA: Diagnosis not present

## 2017-02-24 DIAGNOSIS — E119 Type 2 diabetes mellitus without complications: Secondary | ICD-10-CM

## 2017-02-24 DIAGNOSIS — Z23 Encounter for immunization: Secondary | ICD-10-CM

## 2017-02-24 DIAGNOSIS — K219 Gastro-esophageal reflux disease without esophagitis: Secondary | ICD-10-CM

## 2017-02-24 MED ORDER — METOPROLOL SUCCINATE ER 25 MG PO TB24
25.0000 mg | ORAL_TABLET | Freq: Every day | ORAL | 1 refills | Status: DC
Start: 2017-02-24 — End: 2017-05-29

## 2017-02-24 MED ORDER — AMLODIPINE BESYLATE 10 MG PO TABS: 10.0000 mg | ORAL_TABLET | Freq: Every day | ORAL | 3 refills | Status: DC

## 2017-02-24 MED ORDER — ISOSORBIDE MONONITRATE ER 30 MG PO TB24: 30.0000 mg | ORAL_TABLET | Freq: Every day | ORAL | 1 refills | Status: DC

## 2017-02-24 MED ORDER — PRAVASTATIN SODIUM 80 MG PO TABS: ORAL_TABLET | ORAL | 0 refills | Status: DC

## 2017-02-24 MED ORDER — GLUCOSE BLOOD VI STRP: ORAL_STRIP | 3 refills | Status: DC

## 2017-02-24 MED ORDER — ALBUTEROL SULFATE (2.5 MG/3ML) 0.083% IN NEBU: 2.5000 mg | INHALATION_SOLUTION | Freq: Four times a day (QID) | RESPIRATORY_TRACT | 3 refills | Status: DC | PRN

## 2017-02-24 MED ORDER — BUDESONIDE-FORMOTEROL FUMARATE 160-4.5 MCG/ACT IN AERO: 2.0000 | INHALATION_SPRAY | Freq: Two times a day (BID) | RESPIRATORY_TRACT | 0 refills | Status: DC

## 2017-02-24 NOTE — Progress Notes (Signed)
Subjective:  Patient ID: Alexa Key, female    DOB: 09/27/1950  Age: 66 y.o. MRN: [id number]  CC: Hypertension (pt here today for routine follow up of her chronic medical conditions)   HPI ADRINA ARMIJO presents for  follow-up of hypertension. Patient has no history of headache chest pain or shortness of breath or recent cough. Patient also denies symptoms of TIA such as numbness weakness lateralizing. Patient checks  blood pressure at home and has not had any elevated readings recently. Patient denies side effects from his medication. States taking it regularly. Patient is a diabetic but has traditionally had good A1c's. She is not checking her blood sugar because she's had trouble getting approval from her insurance company.  Patient also having trouble with the [facility] and incruse inhalers. They do not agree with her because they're such a blast of liquid in the back of her throat that it makes her gag or choke. She did like to go back to the Symbicort. Her breathing has been adequate lately. She is able to go about her daily routine.  Patient in for follow-up of elevated cholesterol. Doing well without complaints on current medication. Denies side effects of statin including myalgia and arthralgia and nausea. Also in today for liver function testing. Currently no chest pain, shortness of breath or other cardiovascular related symptoms noted. Patient in for follow-up of GERD. Currently asymptomatic taking  PPI daily. There is no chest pain or heartburn. No hematemesis and no melena. No dysphagia or choking. Onset is remote. Progression is stable. Complicating factors, none.    Depression screen [facility] 2/9 02/24/2017 12/26/2016 09/19/2016  Decreased Interest 0 0 0  Down, Depressed, Hopeless 0 0 0  PHQ - 2 Score 0 0 0  Altered sleeping - - -  Tired, decreased energy - - -  Change in appetite - - -  Feeling bad or failure about yourself  - - -  Trouble concentrating - - -  Moving slowly or  fidgety/restless - - -  Suicidal thoughts - - -  PHQ-9 Score - - -  Difficult doing work/chores - - -    History Sarahjane has a past medical history of Abnormal cardiac function test (09/22/2014); Anxiety; Asthma; CAD (coronary artery disease); COPD (chronic obstructive pulmonary disease) ([place]); CVA (cerebral infarction); Diabetes mellitus without complication ([place]); Diverticulitis (08/04/12); Fibromyalgia; GERD (gastroesophageal reflux disease); H/O echocardiogram; HLD (hyperlipidemia); HTN (hypertension); PONV (postoperative nausea and vomiting); PVD (peripheral vascular disease) ([place]); S/P partial lobectomy of lung (1997); Stroke [facility]); Subclavian steal syndrome (2004); and Tobacco abuse.   She has a past surgical history that includes Hysterectomoy; Lumbar spine surgery; Carpal tunnel release; Coronary angioplasty; coronary stents ; partial lobectomy of lung  (1998); Abdominal hysterectomy; Back surgery; Nephrolithotomy (Right, 12/24/2012); Flexible sigmoidoscopy (07/11/2014); laparotomy (N/A, 10/02/2014); Cardiac catheterization (N/A, 10/15/2014); Colonoscopy (N/A, 01/29/2015); Colostomy closure (N/A, 06/02/2015); and Partial colectomy (N/A, 06/02/2015).   Her family history includes Kidney disease in her mother.She reports that she quit smoking about 2 years ago. Her smoking use included Cigarettes. She has a 12.25 pack-year smoking history. She has never used smokeless tobacco. She reports that she does not drink alcohol or use drugs.    ROS Review of Systems  Constitutional: Negative for activity change, appetite change and fever.  HENT: Negative for congestion, rhinorrhea and sore throat.   Eyes: Negative for visual disturbance.  Respiratory: Negative for cough and shortness of breath.   Cardiovascular: Negative for chest pain and palpitations.  Gastrointestinal: Negative  for abdominal pain, diarrhea and nausea.  Genitourinary: Negative for dysuria.  Musculoskeletal: Negative for arthralgias and  myalgias.    Objective:  BP 118/70   Pulse 64   Temp 97.9 F (36.6 C) (Oral)   Ht 5\' 1"  (1.549 m)   Wt 146 lb (66.2 kg)   BMI 27.59 kg/m   BP Readings from Last 3 Encounters:  02/24/17 118/70  12/26/16 139/69  09/19/16 126/64    Wt Readings from Last 3 Encounters:  02/24/17 146 lb (66.2 kg)  12/26/16 147 lb (66.7 kg)  09/19/16 141 lb (64 kg)     Physical Exam  Constitutional: She is oriented to person, place, and time. She appears well-developed and well-nourished. No distress.  HENT:  Head: Normocephalic and atraumatic.  Right Ear: External ear normal.  Left Ear: External ear normal.  Nose: Nose normal.  Mouth/Throat: Oropharynx is clear and moist.  Eyes: Pupils are equal, round, and reactive to light. Conjunctivae and EOM are normal.  Neck: Normal range of motion. Neck supple. No thyromegaly present.  Cardiovascular: Normal rate, regular rhythm and normal heart sounds.   No murmur heard. Pulmonary/Chest: Effort normal and breath sounds normal. No respiratory distress. She has no wheezes. She has no rales.  Abdominal: Soft. Bowel sounds are normal. She exhibits no distension. There is no tenderness.  Lymphadenopathy:    She has no cervical adenopathy.  Neurological: She is alert and oriented to person, place, and time. She has normal reflexes.  Skin: Skin is warm and dry.  Psychiatric: She has a normal mood and affect. Her behavior is normal. Judgment and thought content normal.      Assessment & Plan:   Samiyah was seen today for hypertension.  Diagnoses and all orders for this visit:  Diabetes mellitus without complication ([place]) -     Urinalysis -     Microalbumin / creatinine urine ratio  Essential hypertension -     amLODipine (NORVASC) 10 MG tablet; Take 1 tablet (10 mg total) by mouth daily. -     isosorbide mononitrate (IMDUR) 30 MG 24 hr tablet; Take 1 tablet (30 mg total) by mouth daily.  Gastroesophageal reflux disease without  esophagitis  Pure hypercholesterolemia -     pravastatin (PRAVACHOL) 80 MG tablet; TAKE 1 TABLET (80 MG TOTAL) AT BEDTIME.  Other orders -     albuterol (PROVENTIL) (2.5 MG/3ML) 0.083% nebulizer solution; Take 3 mLs (2.5 mg total) by nebulization every 6 (six) hours as needed for wheezing or shortness of breath. -     budesonide-formoterol (SYMBICORT) 160-4.5 MCG/ACT inhaler; Inhale 2 puffs into the lungs 2 (two) times daily. -     glucose blood test strip; Use as instructed -     metoprolol succinate (TOPROL-XL) 25 MG 24 hr tablet; Take 1 tablet (25 mg total) by mouth daily.   Today the patient was reluctant have blood work. I told her that as long as she followed her diet and gets regular exercise we could wait until next time for that    I have discontinued Ms. Swango's metoprolol succinate and umeclidinium bromide. I have also changed her metoprolol succinate. Additionally, I am having her start on albuterol and glucose blood. Lastly, I am having her maintain her aspirin, acetaminophen, albuterol, omeprazole, budesonide-formoterol, amLODipine, isosorbide mononitrate, and pravastatin.    Follow-up: Return in about 3 months (around 05/27/2017).  [name], M.D.

## 2017-02-25 DIAGNOSIS — R69 Illness, unspecified: Secondary | ICD-10-CM | POA: Diagnosis not present

## 2017-03-01 ENCOUNTER — Telehealth: Payer: Self-pay | Admitting: Family Medicine

## 2017-03-01 DIAGNOSIS — J42 Unspecified chronic bronchitis: Secondary | ICD-10-CM

## 2017-03-01 DIAGNOSIS — R062 Wheezing: Secondary | ICD-10-CM

## 2017-03-01 NOTE — Telephone Encounter (Signed)
Pt notified RX sent into [facility] in [place] for UA

## 2017-03-02 ENCOUNTER — Other Ambulatory Visit: Payer: Medicare HMO

## 2017-03-02 DIAGNOSIS — E119 Type 2 diabetes mellitus without complications: Secondary | ICD-10-CM | POA: Diagnosis not present

## 2017-03-02 LAB — URINALYSIS
BILIRUBIN UA: NEGATIVE
Glucose, UA: NEGATIVE
KETONES UA: NEGATIVE
LEUKOCYTES UA: NEGATIVE
NITRITE UA: NEGATIVE
PH UA: 5.5 (ref 5.0–7.5)
RBC UA: NEGATIVE
SPEC GRAV UA: 1.025 (ref 1.005–1.030)
Urobilinogen, Ur: 0.2 mg/dL (ref 0.2–1.0)

## 2017-03-03 LAB — MICROALBUMIN / CREATININE URINE RATIO
Creatinine, Urine: 178.5 mg/dL
MICROALB/CREAT RATIO: 527.8 mg/g{creat} — AB (ref 0.0–30.0)
MICROALBUM., U, RANDOM: 942.2 ug/mL

## 2017-03-17 ENCOUNTER — Other Ambulatory Visit: Payer: Self-pay | Admitting: *Deleted

## 2017-03-17 DIAGNOSIS — K219 Gastro-esophageal reflux disease without esophagitis: Secondary | ICD-10-CM

## 2017-03-17 MED ORDER — OMEPRAZOLE 20 MG PO CPDR: 20.0000 mg | DELAYED_RELEASE_CAPSULE | Freq: Every day | ORAL | 3 refills | Status: DC

## 2017-03-17 NOTE — Telephone Encounter (Signed)
Refill was sent previously to mail order for #30 with 11 refills Resent refill

## 2017-04-03 DIAGNOSIS — R69 Illness, unspecified: Secondary | ICD-10-CM | POA: Diagnosis not present

## 2017-04-18 DIAGNOSIS — K08409 Partial loss of teeth, unspecified cause, unspecified class: Secondary | ICD-10-CM | POA: Diagnosis not present

## 2017-04-18 DIAGNOSIS — M545 Low back pain: Secondary | ICD-10-CM | POA: Diagnosis not present

## 2017-04-18 DIAGNOSIS — J449 Chronic obstructive pulmonary disease, unspecified: Secondary | ICD-10-CM | POA: Diagnosis not present

## 2017-04-18 DIAGNOSIS — Z0000 Encounter for general adult medical examination without abnormal findings: Secondary | ICD-10-CM | POA: Diagnosis not present

## 2017-04-18 DIAGNOSIS — I252 Old myocardial infarction: Secondary | ICD-10-CM | POA: Diagnosis not present

## 2017-04-18 DIAGNOSIS — K219 Gastro-esophageal reflux disease without esophagitis: Secondary | ICD-10-CM | POA: Diagnosis not present

## 2017-04-18 DIAGNOSIS — G4700 Insomnia, unspecified: Secondary | ICD-10-CM | POA: Diagnosis not present

## 2017-04-18 DIAGNOSIS — I10 Essential (primary) hypertension: Secondary | ICD-10-CM | POA: Diagnosis not present

## 2017-04-18 DIAGNOSIS — R69 Illness, unspecified: Secondary | ICD-10-CM | POA: Diagnosis not present

## 2017-04-18 DIAGNOSIS — E7800 Pure hypercholesterolemia, unspecified: Secondary | ICD-10-CM | POA: Diagnosis not present

## 2017-05-05 DIAGNOSIS — R69 Illness, unspecified: Secondary | ICD-10-CM | POA: Diagnosis not present

## 2017-05-11 DIAGNOSIS — R69 Illness, unspecified: Secondary | ICD-10-CM | POA: Diagnosis not present

## 2017-05-18 DIAGNOSIS — R69 Illness, unspecified: Secondary | ICD-10-CM | POA: Diagnosis not present

## 2017-05-29 ENCOUNTER — Ambulatory Visit (INDEPENDENT_AMBULATORY_CARE_PROVIDER_SITE_OTHER): Payer: Medicare HMO | Admitting: Family Medicine

## 2017-05-29 ENCOUNTER — Encounter: Payer: Self-pay | Admitting: Family Medicine

## 2017-05-29 ENCOUNTER — Ambulatory Visit (INDEPENDENT_AMBULATORY_CARE_PROVIDER_SITE_OTHER): Payer: Medicare HMO

## 2017-05-29 VITALS — BP 126/66 | HR 62 | Temp 98.0°F | Ht 61.0 in | Wt 143.0 lb

## 2017-05-29 DIAGNOSIS — E7800 Pure hypercholesterolemia, unspecified: Secondary | ICD-10-CM

## 2017-05-29 DIAGNOSIS — R0609 Other forms of dyspnea: Secondary | ICD-10-CM

## 2017-05-29 DIAGNOSIS — R062 Wheezing: Secondary | ICD-10-CM | POA: Diagnosis not present

## 2017-05-29 DIAGNOSIS — R0600 Dyspnea, unspecified: Secondary | ICD-10-CM | POA: Insufficient documentation

## 2017-05-29 DIAGNOSIS — E785 Hyperlipidemia, unspecified: Secondary | ICD-10-CM

## 2017-05-29 DIAGNOSIS — J42 Unspecified chronic bronchitis: Secondary | ICD-10-CM | POA: Diagnosis not present

## 2017-05-29 DIAGNOSIS — M5412 Radiculopathy, cervical region: Secondary | ICD-10-CM | POA: Diagnosis not present

## 2017-05-29 DIAGNOSIS — E119 Type 2 diabetes mellitus without complications: Secondary | ICD-10-CM

## 2017-05-29 DIAGNOSIS — I10 Essential (primary) hypertension: Secondary | ICD-10-CM | POA: Diagnosis not present

## 2017-05-29 DIAGNOSIS — N289 Disorder of kidney and ureter, unspecified: Secondary | ICD-10-CM

## 2017-05-29 LAB — URINALYSIS
Bilirubin, UA: NEGATIVE
GLUCOSE, UA: NEGATIVE
KETONES UA: NEGATIVE
Leukocytes, UA: NEGATIVE
NITRITE UA: NEGATIVE
RBC UA: NEGATIVE
SPEC GRAV UA: 1.025 (ref 1.005–1.030)
Urobilinogen, Ur: 0.2 mg/dL (ref 0.2–1.0)
pH, UA: 5.5 (ref 5.0–7.5)

## 2017-05-29 LAB — BAYER DCA HB A1C WAIVED: HB A1C: 5.8 % (ref <7.0)

## 2017-05-29 MED ORDER — BUDESONIDE-FORMOTEROL FUMARATE 160-4.5 MCG/ACT IN AERO: 2.0000 | INHALATION_SPRAY | Freq: Two times a day (BID) | RESPIRATORY_TRACT | 6 refills | Status: DC

## 2017-05-29 MED ORDER — FUROSEMIDE 40 MG PO TABS: 40.0000 mg | ORAL_TABLET | Freq: Every day | ORAL | 5 refills | Status: DC

## 2017-05-29 MED ORDER — METOPROLOL SUCCINATE ER 25 MG PO TB24: 25.0000 mg | ORAL_TABLET | Freq: Every day | ORAL | 1 refills | Status: DC

## 2017-05-29 MED ORDER — ALBUTEROL SULFATE HFA 108 (90 BASE) MCG/ACT IN AERS: 2.0000 | INHALATION_SPRAY | Freq: Four times a day (QID) | RESPIRATORY_TRACT | 6 refills | Status: DC | PRN

## 2017-05-29 MED ORDER — ISOSORBIDE MONONITRATE ER 30 MG PO TB24: 30.0000 mg | ORAL_TABLET | Freq: Every day | ORAL | 1 refills | Status: DC

## 2017-05-29 MED ORDER — PRAVASTATIN SODIUM 80 MG PO TABS: ORAL_TABLET | ORAL | 1 refills | Status: DC

## 2017-05-29 MED ORDER — POTASSIUM CHLORIDE CRYS ER 20 MEQ PO TBCR: 20.0000 meq | EXTENDED_RELEASE_TABLET | Freq: Every day | ORAL | 5 refills | Status: DC

## 2017-05-29 MED ORDER — AMLODIPINE BESYLATE 10 MG PO TABS: 5.0000 mg | ORAL_TABLET | Freq: Every day | ORAL | 3 refills | Status: DC

## 2017-05-29 NOTE — Patient Instructions (Signed)
Keep your BP below 135/85.

## 2017-05-29 NOTE — Progress Notes (Signed)
Subjective:  Patient ID: Alexa Key,  female    DOB: 15-Jul-1950  Age: 67 y.o.    CC: Diabetes (pt here today for routine follow up of her chronic medical conditions and also c/o left hand numbness, tingling and shooting pain on the middle and pointer finger along with her thumb.)   HPI Alexa Key presents for  follow-up of hypertension. Patient has no history of headache chest pain or shortness of breath or recent cough. Patient also denies symptoms of TIA such as numbness weakness lateralizing.  Patient denies side effects from medication. States taking it regularly.  Patient also  in for follow-up of elevated cholesterol. Doing well without complaints on current medication. Denies side effects of statin including myalgia and arthralgia and nausea. Also in today for liver function testing. Currently no chest pain, shortness of breath or other cardiovascular related symptoms noted.  Follow-up of diabetes. Patient does check blood sugar at home. Readings run 105 approximately, random. Patient denies symptoms such as polyuria, polydipsia, excessive hunger, nausea No significant hypoglycemic spells noted. Medications reviewed. Pt reports taking them regularly. Pt. denies complication/adverse reaction today.   Daily shortness of breath is increasing.  She is a chronic bronchitis patient.  She can hardly make it to her mailbox to check her mail due to dyspnea with exertion.  She is able to lay down at night.  She is using her inhaler, Symbicort according to directions, daily. History Alexa Key has a past medical history of Abnormal cardiac function test (09/22/2014), Acute on chronic diastolic heart failure ([place]), Anxiety, Asthma, CAD (coronary artery disease), COPD (chronic obstructive pulmonary disease) ([place]), CVA (cerebral infarction), Diabetes mellitus without complication ([place]), Diverticulitis (08/04/12), Fibromyalgia, GERD (gastroesophageal reflux disease), H/O echocardiogram, HLD  (hyperlipidemia), HTN (hypertension), Ileus ([place]), NSTEMI (non-ST elevated myocardial infarction) ([place]) (08/07/2012), PONV (postoperative nausea and vomiting), PVD (peripheral vascular disease) ([place]), S/P partial lobectomy of lung (1997), Stroke [facility]), Subclavian steal syndrome (2004), and Tobacco abuse.   She has a past surgical history that includes Hysterectomoy; Lumbar spine surgery; Carpal tunnel release; Coronary angioplasty; coronary stents ; partial lobectomy of lung  (1998); Abdominal hysterectomy; Back surgery; Nephrolithotomy (Right, 12/24/2012); Flexible sigmoidoscopy (07/11/2014); laparotomy (N/A, 10/02/2014); Cardiac catheterization (N/A, 10/15/2014); Colonoscopy (N/A, 01/29/2015); Colostomy closure (N/A, 06/02/2015); and Partial colectomy (N/A, 06/02/2015).   Her family history includes Kidney disease in her mother.She reports that she quit smoking about 2 years ago. Her smoking use included cigarettes. She has a 12.25 pack-year smoking history. she has never used smokeless tobacco. She reports that she does not drink alcohol or use drugs.  Current Outpatient Medications on File Prior to Visit  Medication Sig Dispense Refill  . albuterol (PROVENTIL) (2.5 MG/3ML) 0.083% nebulizer solution Take 3 mLs (2.5 mg total) by nebulization every 6 (six) hours as needed for wheezing or shortness of breath. 120 mL 3  . aspirin 325 MG tablet Take 325 mg by mouth daily.    Marland Kitchen glucose blood test strip Use as instructed 200 each 3  . omeprazole (PRILOSEC) 20 MG capsule Take 1 capsule (20 mg total) daily by mouth. 90 capsule 3   No current facility-administered medications on file prior to visit.     ROS Review of Systems  Constitutional: Negative for activity change, appetite change and fever.  HENT: Negative for congestion, rhinorrhea and sore throat.   Eyes: Negative for visual disturbance.  Respiratory: Positive for shortness of breath. Negative for cough.   Cardiovascular: Negative for chest pain and  palpitations.  Gastrointestinal: Negative for abdominal pain, diarrhea and nausea.  Genitourinary: Negative for dysuria.  Musculoskeletal: Negative for arthralgias and myalgias.    Objective:  BP 126/66   Pulse 62   Temp 98 F (36.7 C) (Oral)   Ht '5\' 1"'  (1.549 m)   Wt 143 lb (64.9 kg)   BMI 27.02 kg/m   BP Readings from Last 3 Encounters:  05/29/17 126/66  02/24/17 118/70  12/26/16 139/69    Wt Readings from Last 3 Encounters:  05/29/17 143 lb (64.9 kg)  02/24/17 146 lb (66.2 kg)  12/26/16 147 lb (66.7 kg)     Physical Exam  Constitutional: She is oriented to person, place, and time. She appears well-developed and well-nourished. No distress.  HENT:  Head: Normocephalic and atraumatic.  Right Ear: External ear normal.  Left Ear: External ear normal.  Nose: Nose normal.  Mouth/Throat: Oropharynx is clear and moist.  Eyes: Conjunctivae and EOM are normal. Pupils are equal, round, and reactive to light.  Neck: Normal range of motion. Neck supple. No thyromegaly present.  Cardiovascular: Normal rate, regular rhythm and normal heart sounds.  No murmur heard. Pulmonary/Chest: Effort normal. No respiratory distress. She has wheezes. She has no rales.  Abdominal: Soft. Bowel sounds are normal. She exhibits no distension. There is no tenderness.  Lymphadenopathy:    She has no cervical adenopathy.  Neurological: She is alert and oriented to person, place, and time. She has normal reflexes.  Skin: Skin is warm and dry.  Psychiatric: She has a normal mood and affect. Her behavior is normal. Judgment and thought content normal.    Diabetic Foot Exam - Simple   Simple Foot Form Diabetic Foot exam was performed with the following findings:  Yes 05/29/2017 11:27 AM  Visual Inspection Sensation Testing Intact to touch and monofilament testing bilaterally:  Yes Pulse Check Posterior Tibialis and Dorsalis pulse intact bilaterally:  Yes Comments     Pulmonary function  testing done today shows significant restriction.  Possibly some obstruction but with the severity of the restriction is difficult to say.  Chest x-ray:  Assessment & Plan:   Alexa Key was seen today for diabetes.  Diagnoses and all orders for this visit:  Chronic bronchitis, unspecified chronic bronchitis type ([place])  Dyspnea on exertion -     PR EVAL OF BRONCHOSPASM -     DG Chest 2 View; Future  Essential hypertension -     CBC with Differential/Platelet -     CMP14+EGFR -     isosorbide mononitrate (IMDUR) 30 MG 24 hr tablet; Take 1 tablet (30 mg total) by mouth daily. -     amLODipine (NORVASC) 10 MG tablet; Take 0.5 tablets (5 mg total) by mouth daily.  Renal insufficiency  Dyslipidemia -     Lipid panel  Cervical radiculopathy at C7  Diabetes mellitus without complication (HCC) -     Microalbumin / creatinine urine ratio -     Bayer DCA Hb A1c Waived -     Urinalysis  Pure hypercholesterolemia -     pravastatin (PRAVACHOL) 80 MG tablet; TAKE 1 TABLET (80 MG TOTAL) AT BEDTIME.  Wheezes -     albuterol (PROVENTIL HFA;VENTOLIN HFA) 108 (90 Base) MCG/ACT inhaler; Inhale 2 puffs into the lungs every 6 (six) hours as needed for wheezing or shortness of breath.  Other orders -     budesonide-formoterol (SYMBICORT) 160-4.5 MCG/ACT inhaler; Inhale 2 puffs into the lungs 2 (two) times daily. -     metoprolol  succinate (TOPROL-XL) 25 MG 24 hr tablet; Take 1 tablet (25 mg total) by mouth daily. -     furosemide (LASIX) 40 MG tablet; Take 1 tablet (40 mg total) by mouth daily. For swelling -     potassium chloride SA (K-DUR,KLOR-CON) 20 MEQ tablet; Take 1 tablet (20 mEq total) by mouth daily. For potassium replacement/ supplement   I have discontinued Rael Tilly. Pote's acetaminophen. I have also changed her amLODipine. Additionally, I am having her start on furosemide and potassium chloride SA. Lastly, I am having her maintain her aspirin, albuterol, glucose blood, omeprazole,  isosorbide mononitrate, pravastatin, albuterol, budesonide-formoterol, and metoprolol succinate.  Meds ordered this encounter  Medications  . isosorbide mononitrate (IMDUR) 30 MG 24 hr tablet    Sig: Take 1 tablet (30 mg total) by mouth daily.    Dispense:  90 tablet    Refill:  1  . pravastatin (PRAVACHOL) 80 MG tablet    Sig: TAKE 1 TABLET (80 MG TOTAL) AT BEDTIME.    Dispense:  90 tablet    Refill:  1  . albuterol (PROVENTIL HFA;VENTOLIN HFA) 108 (90 Base) MCG/ACT inhaler    Sig: Inhale 2 puffs into the lungs every 6 (six) hours as needed for wheezing or shortness of breath.    Dispense:  1 Inhaler    Refill:  6  . budesonide-formoterol (SYMBICORT) 160-4.5 MCG/ACT inhaler    Sig: Inhale 2 puffs into the lungs 2 (two) times daily.    Dispense:  1 Inhaler    Refill:  6    Lot # [id number] Exp 10/2017  . metoprolol succinate (TOPROL-XL) 25 MG 24 hr tablet    Sig: Take 1 tablet (25 mg total) by mouth daily.    Dispense:  90 tablet    Refill:  1  . amLODipine (NORVASC) 10 MG tablet    Sig: Take 0.5 tablets (5 mg total) by mouth daily.    Dispense:  90 tablet    Refill:  3  . furosemide (LASIX) 40 MG tablet    Sig: Take 1 tablet (40 mg total) by mouth daily. For swelling    Dispense:  30 tablet    Refill:  5  . potassium chloride SA (K-DUR,KLOR-CON) 20 MEQ tablet    Sig: Take 1 tablet (20 mEq total) by mouth daily. For potassium replacement/ supplement    Dispense:  30 tablet    Refill:  5     Follow-up: Return in about 1 week (around 06/05/2017).  [name], M.D.

## 2017-05-30 ENCOUNTER — Ambulatory Visit: Payer: Medicare HMO | Admitting: Family Medicine

## 2017-05-30 LAB — CMP14+EGFR
ALBUMIN: 4 g/dL (ref 3.6–4.8)
ALK PHOS: 121 IU/L — AB (ref 39–117)
ALT: 6 IU/L (ref 0–32)
AST: 14 IU/L (ref 0–40)
Albumin/Globulin Ratio: 1.4 (ref 1.2–2.2)
BILIRUBIN TOTAL: 0.3 mg/dL (ref 0.0–1.2)
BUN / CREAT RATIO: 11 — AB (ref 12–28)
BUN: 16 mg/dL (ref 8–27)
CO2: 22 mmol/L (ref 20–29)
CREATININE: 1.52 mg/dL — AB (ref 0.57–1.00)
Calcium: 9 mg/dL (ref 8.7–10.3)
Chloride: 109 mmol/L — ABNORMAL HIGH (ref 96–106)
GFR calc Af Amer: 40 mL/min/{1.73_m2} — ABNORMAL LOW (ref >59)
GFR calc non Af Amer: 35 mL/min/{1.73_m2} — ABNORMAL LOW (ref >59)
GLOBULIN, TOTAL: 2.9 g/dL (ref 1.5–4.5)
Glucose: 123 mg/dL — ABNORMAL HIGH (ref 65–99)
Potassium: 4.8 mmol/L (ref 3.5–5.2)
SODIUM: 148 mmol/L — AB (ref 134–144)
Total Protein: 6.9 g/dL (ref 6.0–8.5)

## 2017-05-30 LAB — CBC WITH DIFFERENTIAL/PLATELET
Basophils Absolute: 0 10*3/uL (ref 0.0–0.2)
Basos: 0 %
EOS (ABSOLUTE): 0.2 10*3/uL (ref 0.0–0.4)
EOS: 2 %
HEMATOCRIT: 40.1 % (ref 34.0–46.6)
HEMOGLOBIN: 13 g/dL (ref 11.1–15.9)
Immature Grans (Abs): 0 10*3/uL (ref 0.0–0.1)
Immature Granulocytes: 0 %
LYMPHS ABS: 3.5 10*3/uL — AB (ref 0.7–3.1)
Lymphs: 32 %
MCH: 29.1 pg (ref 26.6–33.0)
MCHC: 32.4 g/dL (ref 31.5–35.7)
MCV: 90 fL (ref 79–97)
MONOCYTES: 6 %
Monocytes Absolute: 0.7 10*3/uL (ref 0.1–0.9)
NEUTROS ABS: 6.6 10*3/uL (ref 1.4–7.0)
Neutrophils: 60 %
Platelets: 231 10*3/uL (ref 150–379)
RBC: 4.47 x10E6/uL (ref 3.77–5.28)
RDW: 15.6 % — ABNORMAL HIGH (ref 12.3–15.4)
WBC: 11 10*3/uL — ABNORMAL HIGH (ref 3.4–10.8)

## 2017-05-30 LAB — LIPID PANEL
CHOLESTEROL TOTAL: 210 mg/dL — AB (ref 100–199)
Chol/HDL Ratio: 4.9 ratio — ABNORMAL HIGH (ref 0.0–4.4)
HDL: 43 mg/dL (ref >39)
LDL Calculated: 136 mg/dL — ABNORMAL HIGH (ref 0–99)
Triglycerides: 155 mg/dL — ABNORMAL HIGH (ref 0–149)
VLDL Cholesterol Cal: 31 mg/dL (ref 5–40)

## 2017-05-30 LAB — MICROALBUMIN / CREATININE URINE RATIO
Creatinine, Urine: 168.6 mg/dL
MICROALB/CREAT RATIO: 542.1 mg/g{creat} — AB (ref 0.0–30.0)
MICROALBUM., U, RANDOM: 913.9 ug/mL

## 2017-06-01 ENCOUNTER — Encounter: Payer: Self-pay | Admitting: Family Medicine

## 2017-06-06 ENCOUNTER — Ambulatory Visit (INDEPENDENT_AMBULATORY_CARE_PROVIDER_SITE_OTHER): Payer: Medicare HMO | Admitting: Family Medicine

## 2017-06-06 ENCOUNTER — Encounter: Payer: Self-pay | Admitting: Family Medicine

## 2017-06-06 VITALS — BP 143/64 | HR 73 | Temp 99.0°F | Ht 61.0 in | Wt 140.0 lb

## 2017-06-06 DIAGNOSIS — R600 Localized edema: Secondary | ICD-10-CM | POA: Diagnosis not present

## 2017-06-06 DIAGNOSIS — J42 Unspecified chronic bronchitis: Secondary | ICD-10-CM

## 2017-06-06 DIAGNOSIS — R109 Unspecified abdominal pain: Secondary | ICD-10-CM

## 2017-06-06 DIAGNOSIS — R609 Edema, unspecified: Secondary | ICD-10-CM | POA: Diagnosis not present

## 2017-06-06 LAB — URINALYSIS
Bilirubin, UA: NEGATIVE
Glucose, UA: NEGATIVE
Ketones, UA: NEGATIVE
LEUKOCYTES UA: NEGATIVE
Nitrite, UA: NEGATIVE
PH UA: 5.5 (ref 5.0–7.5)
RBC UA: NEGATIVE
SPEC GRAV UA: 1.02 (ref 1.005–1.030)
Urobilinogen, Ur: 0.2 mg/dL (ref 0.2–1.0)

## 2017-06-06 MED ORDER — AZITHROMYCIN 250 MG PO TABS: ORAL_TABLET | ORAL | 0 refills | Status: DC

## 2017-06-06 NOTE — Progress Notes (Signed)
Subjective:  Patient ID: Alexa Key, female    DOB: 14-Oct-1950  Age: 67 y.o. MRN: [id number]  CC: Follow-up (pt here today for 1 week follow up and also c/o cough )   HPI Alexa Key presents for cough and congestion for 2-3 days.  Her breathing has actually improved in spite of this.  She does not notice any swelling at this time.  She is in today for recheck of her kidney related labs due to the use of the fluid medicine and potassium.  Her cough is productive of yellow sputum at times.  She has had no fever chills or sweats.  Minimal edema has been transient.  Depression screen [facility] 2/9 05/29/2017 02/24/2017 12/26/2016  Decreased Interest 0 0 0  Down, Depressed, Hopeless 0 0 0  PHQ - 2 Score 0 0 0  Altered sleeping - - -  Tired, decreased energy - - -  Change in appetite - - -  Feeling bad or failure about yourself  - - -  Trouble concentrating - - -  Moving slowly or fidgety/restless - - -  Suicidal thoughts - - -  PHQ-9 Score - - -  Difficult doing work/chores - - -    History Arbadella has a past medical history of Abnormal cardiac function test (09/22/2014), Acute on chronic diastolic heart failure ([place]), Anxiety, Asthma, CAD (coronary artery disease), COPD (chronic obstructive pulmonary disease) ([place]), CVA (cerebral infarction), Diabetes mellitus without complication ([place]), Diverticulitis (08/04/12), Fibromyalgia, GERD (gastroesophageal reflux disease), H/O echocardiogram, HLD (hyperlipidemia), HTN (hypertension), Ileus ([place]), NSTEMI (non-ST elevated myocardial infarction) ([place]) (08/07/2012), PONV (postoperative nausea and vomiting), PVD (peripheral vascular disease) ([place]), S/P partial lobectomy of lung (1997), Stroke [facility]), Subclavian steal syndrome (2004), and Tobacco abuse.   She has a past surgical history that includes Hysterectomoy; Lumbar spine surgery; Carpal tunnel release; Coronary angioplasty; coronary stents ; partial lobectomy of lung  (1998); Abdominal hysterectomy; Back  surgery; Nephrolithotomy (Right, 12/24/2012); Flexible sigmoidoscopy (07/11/2014); laparotomy (N/A, 10/02/2014); Cardiac catheterization (N/A, 10/15/2014); Colonoscopy (N/A, 01/29/2015); Colostomy closure (N/A, 06/02/2015); and Partial colectomy (N/A, 06/02/2015).   Her family history includes Kidney disease in her mother.She reports that she quit smoking about 2 years ago. Her smoking use included cigarettes. She has a 12.25 pack-year smoking history. she has never used smokeless tobacco. She reports that she does not drink alcohol or use drugs.    ROS Review of Systems Negative except as per HPI Objective:  BP (!) 143/64   Pulse 73   Temp 99 F (37.2 C) (Oral)   Ht _0  (1.549 m)   Wt 140 lb (63.5 kg)   SpO2 91%   BMI 26.45 kg/m   BP Readings from Last 3 Encounters:  06/06/17 (!) 143/64  05/29/17 126/66  02/24/17 118/70    Wt Readings from Last 3 Encounters:  06/06/17 140 lb (63.5 kg)  05/29/17 143 lb (64.9 kg)  02/24/17 146 lb (66.2 kg)     Physical Exam  Constitutional: She is oriented to person, place, and time. She appears well-developed and well-nourished. No distress.  HENT:  Head: Atraumatic.  Neck: Neck supple.  Cardiovascular: Normal rate and regular rhythm.  No murmur heard. Pulmonary/Chest: No respiratory distress (Breath sounds are distant with decreased expiratory phase). She has wheezes (Faint bronchovesicular changes).  Abdominal: There is no tenderness.  Musculoskeletal: Normal range of motion.  Neurological: She is alert and oriented to person, place, and time.  Skin: Skin is warm and dry. No erythema.  Psychiatric:  She has a normal mood and affect. Her behavior is normal.      Assessment & Plan:   Alexa Key was seen today for follow-up.  Diagnoses and all orders for this visit:  Flank pain -     Urinalysis -     CMP14+EGFR  Chronic bronchitis, unspecified chronic bronchitis type (HCC)  Edema, unspecified type -     Brain natriuretic  peptide  Other orders -     azithromycin (ZITHROMAX) 250 MG tablet; Take as directed.       I am having Alexa Key. Alexa Key start on azithromycin. I am also having her maintain her aspirin, albuterol, glucose blood, omeprazole, isosorbide mononitrate, pravastatin, albuterol, budesonide-formoterol, metoprolol succinate, amLODipine, furosemide, and potassium chloride SA.    Follow-up: Return in about 3 months (around 09/03/2017), or if symptoms worsen or fail to improve.  [name], M.D.

## 2017-06-07 ENCOUNTER — Encounter: Payer: Self-pay | Admitting: *Deleted

## 2017-06-07 ENCOUNTER — Other Ambulatory Visit: Payer: Self-pay | Admitting: *Deleted

## 2017-06-07 ENCOUNTER — Ambulatory Visit (INDEPENDENT_AMBULATORY_CARE_PROVIDER_SITE_OTHER): Payer: Medicare HMO | Admitting: *Deleted

## 2017-06-07 ENCOUNTER — Ambulatory Visit (INDEPENDENT_AMBULATORY_CARE_PROVIDER_SITE_OTHER): Payer: Medicare HMO

## 2017-06-07 VITALS — BP 110/59 | HR 79 | Ht 59.5 in | Wt 140.0 lb

## 2017-06-07 DIAGNOSIS — Z0000 Encounter for general adult medical examination without abnormal findings: Secondary | ICD-10-CM

## 2017-06-07 DIAGNOSIS — Z780 Asymptomatic menopausal state: Secondary | ICD-10-CM | POA: Diagnosis not present

## 2017-06-07 DIAGNOSIS — R7989 Other specified abnormal findings of blood chemistry: Secondary | ICD-10-CM

## 2017-06-07 DIAGNOSIS — M810 Age-related osteoporosis without current pathological fracture: Secondary | ICD-10-CM | POA: Diagnosis not present

## 2017-06-07 LAB — CMP14+EGFR
ALT: 10 IU/L (ref 0–32)
AST: 13 IU/L (ref 0–40)
Albumin/Globulin Ratio: 1.3 (ref 1.2–2.2)
Albumin: 4.2 g/dL (ref 3.6–4.8)
Alkaline Phosphatase: 113 IU/L (ref 39–117)
BUN/Creatinine Ratio: 8 — ABNORMAL LOW (ref 12–28)
BUN: 17 mg/dL (ref 8–27)
Bilirubin Total: 0.3 mg/dL (ref 0.0–1.2)
CALCIUM: 9.4 mg/dL (ref 8.7–10.3)
CO2: 24 mmol/L (ref 20–29)
CREATININE: 2.02 mg/dL — AB (ref 0.57–1.00)
Chloride: 104 mmol/L (ref 96–106)
GFR calc Af Amer: 29 mL/min/{1.73_m2} — ABNORMAL LOW (ref >59)
GFR, EST NON AFRICAN AMERICAN: 25 mL/min/{1.73_m2} — AB (ref >59)
GLOBULIN, TOTAL: 3.3 g/dL (ref 1.5–4.5)
Glucose: 106 mg/dL — ABNORMAL HIGH (ref 65–99)
Potassium: 5.4 mmol/L — ABNORMAL HIGH (ref 3.5–5.2)
SODIUM: 146 mmol/L — AB (ref 134–144)
Total Protein: 7.5 g/dL (ref 6.0–8.5)

## 2017-06-07 LAB — BRAIN NATRIURETIC PEPTIDE: BNP: 207.3 pg/mL — ABNORMAL HIGH (ref 0.0–100.0)

## 2017-06-07 NOTE — Patient Instructions (Signed)
  Alexa Key , Thank you for taking time to come for your Medicare Wellness Visit. I appreciate your ongoing commitment to your health goals. Please review the following plan we discussed and let me know if I can assist you in the future.   These are the goals we discussed: Goals    . Exercise 150 min/wk Moderate Activity       This is a list of the screening recommended for you and due dates:  Health Maintenance  Topic Date Due  .  Hepatitis C: One time screening is recommended by Center for Disease Control  (CDC) for  adults born from [birth year] through 1967.   22-Jan-1951  . Eye exam for diabetics  08/05/1958  . Tetanus Vaccine  08/05/1967  . Mammogram  08/05/1998  . Hemoglobin A1C  11/26/2017  . Complete foot exam   05/29/2018  . Urine Protein Check  05/29/2018  . Colon Cancer Screening  01/28/2025  . Flu Shot  Completed  . DEXA scan (bone density measurement)  Completed  . Pneumonia vaccines  Completed   Schedule eye exam You will have a bone density scan today

## 2017-06-07 NOTE — Progress Notes (Signed)
Subjective:   Alexa Key is a 67 y.o. female who presents for an Initial Medicare Annual Wellness Visit. Alexa Key is retired from the [employer] and lives in a ground floor apartment alone. She has two adult daughters and 6 grandchildren.   Review of Systems    Health is a little better than last year.   Cardiac Risk Factors include: sedentary lifestyle;obesity (BMI >30kg/m2);advanced age (>32men, >11 women);hypertension;smoking/ tobacco exposure  Musculoskeletal: chronic low back pain  Other systems negative.     Objective:    Today's Vitals   06/07/17 1512 06/07/17 1515  BP: (!) 110/59   Pulse: 79   Weight: 140 lb (63.5 kg)   Height: 4' 11.5" (1.511 m)   PainSc:  6    Body mass index is 27.8 kg/m.  Advanced Directives 06/07/2017 06/02/2015 05/25/2015 01/29/2015 11/25/2014 09/30/2014 09/29/2014  Does Patient Have a Medical Advance Directive? No No No No No No No  Would patient like information on creating a medical advance directive? No - Patient declined Yes - Spiritual care consult ordered - No - patient declined information Yes - Educational materials given No - patient declined information -  Pre-existing out of facility DNR order (yellow form or pink MOST form) - - - - - - -    Current Medications (verified) Outpatient Encounter Medications as of 06/07/2017  Medication Sig  . albuterol (PROVENTIL HFA;VENTOLIN HFA) 108 (90 Base) MCG/ACT inhaler Inhale 2 puffs into the lungs every 6 (six) hours as needed for wheezing or shortness of breath.  Marland Kitchen albuterol (PROVENTIL) (2.5 MG/3ML) 0.083% nebulizer solution Take 3 mLs (2.5 mg total) by nebulization every 6 (six) hours as needed for wheezing or shortness of breath.  Marland Kitchen amLODipine (NORVASC) 10 MG tablet Take 0.5 tablets (5 mg total) by mouth daily.  Marland Kitchen aspirin 325 MG tablet Take 325 mg by mouth daily.  Marland Kitchen azithromycin (ZITHROMAX) 250 MG tablet Take as directed.  . budesonide-formoterol (SYMBICORT) 160-4.5 MCG/ACT inhaler Inhale  2 puffs into the lungs 2 (two) times daily.  . furosemide (LASIX) 40 MG tablet Take 1 tablet (40 mg total) by mouth daily. For swelling (Patient taking differently: Take 40 mg by mouth every other day. For swelling)  . glucose blood test strip Use as instructed  . isosorbide mononitrate (IMDUR) 30 MG 24 hr tablet Take 1 tablet (30 mg total) by mouth daily.  . metoprolol succinate (TOPROL-XL) 25 MG 24 hr tablet Take 1 tablet (25 mg total) by mouth daily.  Marland Kitchen omeprazole (PRILOSEC) 20 MG capsule Take 1 capsule (20 mg total) daily by mouth.  . potassium chloride SA (K-DUR,KLOR-CON) 20 MEQ tablet Take 1 tablet (20 mEq total) by mouth daily. For potassium replacement/ supplement  . pravastatin (PRAVACHOL) 80 MG tablet TAKE 1 TABLET (80 MG TOTAL) AT BEDTIME.   No facility-administered encounter medications on file as of 06/07/2017.     Allergies (verified) Penicillins and Sulfa antibiotics   History: Past Medical History:  Diagnosis Date  . Abnormal cardiac function test 09/22/2014  . Acute on chronic diastolic heart failure ([place])   . Anxiety   . Asthma   . CAD (coronary artery disease)    a. 07/2012 s/p MI ->not cath candidate 2/2 AKI; b. 09/2012 Neg MV;  c. 08/2014 [facility] CL: EF 45-54%, + ischemia, intermittent risk study.  Marland Kitchen COPD (chronic obstructive pulmonary disease) ([place])   . CVA (cerebral infarction)    TIAx2  . Diabetes mellitus without complication ([place])    on  no meds   . Diverticulitis 08/04/12  . Fibromyalgia   . GERD (gastroesophageal reflux disease)   . H/O echocardiogram    a. 07/2012 Echo: EF 50-55%, grade 2 DD, mild LVH.  Marland Kitchen HLD (hyperlipidemia)   . HTN (hypertension)   . Ileus ([place])   . NSTEMI (non-ST elevated myocardial infarction) ([place]) 08/07/2012  . PONV (postoperative nausea and vomiting)   . PVD (peripheral vascular disease) ([place])   . S/P partial lobectomy of lung 1997   per [facility], incidental finding, path c/w benign lesion  . Stroke ([place])   . Subclavian steal syndrome  2004   Dr. Kellie Simmering. Angioplasty according to patient  . Tobacco abuse    Past Surgical History:  Procedure Laterality Date  . ABDOMINAL HYSTERECTOMY    . BACK SURGERY    . CARDIAC CATHETERIZATION N/A 10/15/2014   Procedure: Left Heart Cath and Coronary Angiography;  Surgeon: Sherren Mocha, MD;  Location: [facility];  Service: Cardiovascular;  Laterality: N/A;  . CARPAL TUNNEL RELEASE    . COLONOSCOPY N/A 01/29/2015   Procedure: COLONOSCOPY;  Surgeon: Leighton Ruff, MD;  Location: [facility];  Service: Endoscopy;  Laterality: N/A;  . COLOSTOMY CLOSURE N/A 06/02/2015   Procedure: COLOSTOMY CLOSURE;  Surgeon: Erroll Luna, MD;  Location: [facility];  Service: General;  Laterality: N/A;  . CORONARY ANGIOPLASTY    . coronary stents     . FLEXIBLE SIGMOIDOSCOPY  07/11/2014   Procedure: FLEXIBLE SIGMOIDOSCOPY;  Surgeon: Leighton Ruff, MD;  Location: [facility];  Service: Endoscopy;;  . Hysterectomoy    . LAPAROTOMY N/A 10/02/2014   Procedure: EXPLORATORY LAPAROTOMY WITH PARTIAL COLECTOMY/COLOSTOMY;  Surgeon: Erroll Luna, MD;  Location: [facility];  Service: General;  Laterality: N/A;  . LUMBAR SPINE SURGERY    . NEPHROLITHOTOMY Right 12/24/2012   Procedure: NEPHROLITHOTOMY PERCUTANEOUS;  Surgeon: Franchot Gallo, MD;  Location: [facility];  Service: Urology;  Laterality: Right;  . PARTIAL COLECTOMY N/A 06/02/2015   Procedure: PARTIAL COLECTOMY;  Surgeon: Erroll Luna, MD;  Location: [facility];  Service: General;  Laterality: N/A;  . partial lobectomy of lung   1998   Family History  Problem Relation Age of Onset  . Kidney disease Mother   . Cirrhosis Father   . Heart attack Brother    Social History   Socioeconomic History  . Marital status: Divorced    Spouse name: Not on file  . Number of children: 2  . Years of education: 27  . Highest education level: 11th grade  Social Needs  . Financial resource strain: Not hard at all  . Food insecurity - worry: Never true  . Food insecurity  - inability: Never true  . Transportation needs - medical: No  . Transportation needs - non-medical: No  Occupational History  . Occupation: retired    Comment: Charity fundraiser  Tobacco Use  . Smoking status: Former Smoker    Packs/day: 0.25    Years: 49.00    Pack years: 12.25    Types: Cigarettes    Last attempt to quit: 09/29/2014    Years since quitting: 2.6  . Smokeless tobacco: Never Used  Substance and Sexual Activity  . Alcohol use: No    Alcohol/week: 0.0 oz  . Drug use: No  . Sexual activity: Not Currently  Other Topics Concern  . Not on file  Social History Narrative   Patient has 2 adult daughters and 6 grandchildren. She is divorced and lives alone in a ground floor apartment.  Tobacco Counseling No tobacco use  Clinical Intake:     Pain : 0-10 Pain Score: 6  Pain Type: Chronic pain Pain Location: Back Pain Orientation: Lower Pain Onset: More than a month ago Pain Frequency: Constant Pain Relieving Factors: sitting or lying down  Effect of Pain on Daily Activities: minimal  Pain Relieving Factors: sitting or lying down   Nutritional Status: BMI 25 -29 Overweight Diabetes: No  What is the last grade level you completed in school?: 11th grade  Interpreter Needed?: No  Information entered by :: Chong Sicilian, RN   Activities of Daily Living In your present state of health, do you have any difficulty performing the following activities: 06/07/2017  Hearing? N  Vision? N  Comment Overdue  Difficulty concentrating or making decisions? N  Walking or climbing stairs? N  Comment doesn't have any at home  Dressing or bathing? N  Doing errands, shopping? N  Preparing Food and eating ? N  Using the Toilet? N  In the past six months, have you accidently leaked urine? N  Do you have problems with loss of bowel control? N  Managing your Medications? N  Managing your Finances? N  Housekeeping or managing your Housekeeping? N  Some recent data might be  hidden     Immunizations and Health Maintenance Immunization History  Administered Date(s) Administered  . Influenza, High Dose Seasonal PF 03/29/2016, 02/24/2017  . Pneumococcal Conjugate-13 03/29/2016  . Pneumococcal-Unspecified 04/05/2017   Health Maintenance Due  Topic Date Due  . Hepatitis C Screening  [birth year]  . OPHTHALMOLOGY EXAM  08/05/1958  . TETANUS/TDAP  08/05/1967  . MAMMOGRAM  08/05/1998    Patient Care Team: [name], MD as PCP - General (Family Medicine) Erroll Luna, MD as Consulting Physician (General Surgery)  No hospitalizations, ER visits, or surgeries this past year.       Assessment:   This is a routine wellness examination for Alexa Key.  Hearing/Vision screen No deficits noted during visit.   Dietary issues and exercise activities discussed: Current Exercise Habits: The patient does not participate in regular exercise at present, Exercise limited by: None identified  Goals    . Exercise 150 min/wk Moderate Activity      Depression Screen PHQ 2/9 Scores 06/07/2017 05/29/2017 02/24/2017 12/26/2016 09/19/2016 09/01/2016 05/24/2016  PHQ - 2 Score 0 0 0 0 0 0 0  PHQ- 9 Score - - - - - - -    Fall Risk Fall Risk  06/07/2017 05/29/2017 02/24/2017 12/26/2016 09/01/2016  Falls in the past year? No No No No No    Is the patient's home free of loose throw rugs in walkways, pet beds, electrical cords, etc?   yes      Grab bars in the bathroom? no      Handrails on the stairs?   yes      Adequate lighting?   yes   Cognitive Function: MMSE - Mini Mental State Exam 06/07/2017  Orientation to time 5  Orientation to Place 5  Registration 3  Attention/ Calculation 3  Recall 3  Language- name 2 objects 2  Language- repeat 1  Language- follow 3 step command 3  Language- read & follow direction 1  Write a sentence 1  Copy design 1  Total score 28        Screening Tests Health Maintenance  Topic Date Due  . Hepatitis C Screening  06/08/1950  .  OPHTHALMOLOGY EXAM  08/05/1958  . TETANUS/TDAP  08/05/1967  .  MAMMOGRAM  08/05/1998  . HEMOGLOBIN A1C  11/26/2017  . FOOT EXAM  05/29/2018  . URINE MICROALBUMIN  05/29/2018  . COLONOSCOPY  01/28/2025  . INFLUENZA VACCINE  Completed  . DEXA SCAN  Completed  . PNA vac Low Risk Adult  Completed     Plan:  Increase activity level. Start with 5 to 10 minutes a day and increase to 150 min of moderate activity a week. Dexa scan done today Schedule eye exam Mammogram recommended  I have personally reviewed and noted the following in the patient's chart:   . Medical and social history . Use of alcohol, tobacco or illicit drugs  . Current medications and supplements . Functional ability and status . Nutritional status . Physical activity . Advanced directives . List of other physicians . Hospitalizations, surgeries, and ER visits in previous 12 months . Vitals . Screenings to include cognitive, depression, and falls . Referrals and appointments  In addition, I have reviewed and discussed with patient certain preventive protocols, quality metrics, and best practice recommendations. A written personalized care plan for preventive services as well as general preventive health recommendations were provided to patient.     Chong Sicilian, RN   06/07/2017   I have reviewed and agree with the above AWV documentation.  [name], M.D.

## 2017-06-26 ENCOUNTER — Ambulatory Visit (INDEPENDENT_AMBULATORY_CARE_PROVIDER_SITE_OTHER): Payer: Medicare HMO | Admitting: Family Medicine

## 2017-06-26 ENCOUNTER — Encounter: Payer: Self-pay | Admitting: Family Medicine

## 2017-06-26 VITALS — BP 123/63 | HR 54 | Temp 98.4°F | Ht 59.5 in | Wt 141.4 lb

## 2017-06-26 DIAGNOSIS — M818 Other osteoporosis without current pathological fracture: Secondary | ICD-10-CM | POA: Diagnosis not present

## 2017-06-26 NOTE — Progress Notes (Signed)
Chief Complaint  Patient presents with  . Follow-up    pt here today to go over DEXA     HPI  Patient presents today for consultation regarding the results of her recently performed DEXA scan.  Patient was noted to be osteopenic at the spine with a score of -2.3.  She was noted to have a score -3.1 at the hip.  We discussed the potential for fracture.  Her past her likelihood of a major osteoporotic fracture was calculated at 20% in the next decade 3% possibility for the hip.  See the attached report which was reviewed in detail with the patient.  Subsequently we discussed the potential treatments available.  The patient is not interested in taking more medication.  We discussed Fosamax and alendronate as well as Evista/raloxifene.  Detailed side effect profile was reviewed as well.  Patient was adamantly opposed to medication and was about to abort the visit when I pointed out that there was a shot available that she could take once every 6 months.  Her daughter who is with her insisted that discuss that further and patient eventually agreed to a Prolia.  PMH: Smoking status noted ROS: Per HPI  Objective: BP 123/63   Pulse (!) 54   Temp 98.4 F (36.9 C) (Oral)   Ht 4' 11.5" (1.511 m)   Wt 141 lb 6 oz (64.1 kg)   BMI 28.08 kg/m    Assessment and plan:  1. Other osteoporosis, unspecified pathological fracture presence     Order placed for Prolia.  Patient reminded to take calcium 600 mg twice daily and vitamin D 2000 units daily.  Orders Placed This Encounter  Procedures  . VITAMIN D 25 Hydroxy (Vit-D Deficiency, Fractures)    Follow up as previously arranged, approximately 3 months.  Follow-up as needed should her CHF symptoms return.  20 minutes was spent with this patient more than half of which was spent in counseling regarding the results of her bone density test potential complications of osteopenia osteoporosis and alternatives for treatment. Alexa Fraise,  MD

## 2017-06-26 NOTE — Patient Instructions (Signed)
Calcium Carb 600mg  by mouth twice daily OTC  Vitamin D 2000 units daily  We are working on getting the Prolia Injection approved for the osteoporosis.

## 2017-06-27 ENCOUNTER — Other Ambulatory Visit: Payer: Self-pay | Admitting: *Deleted

## 2017-06-27 LAB — VITAMIN D 25 HYDROXY (VIT D DEFICIENCY, FRACTURES): VIT D 25 HYDROXY: 18.4 ng/mL — AB (ref 30.0–100.0)

## 2017-06-27 MED ORDER — VITAMIN D (ERGOCALCIFEROL) 1.25 MG (50000 UNIT) PO CAPS: 50000.0000 [IU] | ORAL_CAPSULE | ORAL | 1 refills | Status: DC

## 2017-07-23 DIAGNOSIS — R69 Illness, unspecified: Secondary | ICD-10-CM | POA: Diagnosis not present

## 2017-08-28 ENCOUNTER — Ambulatory Visit: Payer: Medicare HMO | Admitting: Family Medicine

## 2017-09-04 ENCOUNTER — Ambulatory Visit (INDEPENDENT_AMBULATORY_CARE_PROVIDER_SITE_OTHER): Payer: Medicare HMO | Admitting: Family Medicine

## 2017-09-04 ENCOUNTER — Encounter: Payer: Self-pay | Admitting: Family Medicine

## 2017-09-04 VITALS — BP 135/58 | HR 61 | Temp 97.8°F | Ht 59.5 in | Wt 145.2 lb

## 2017-09-04 DIAGNOSIS — E559 Vitamin D deficiency, unspecified: Secondary | ICD-10-CM | POA: Diagnosis not present

## 2017-09-04 DIAGNOSIS — J42 Unspecified chronic bronchitis: Secondary | ICD-10-CM | POA: Diagnosis not present

## 2017-09-04 DIAGNOSIS — K219 Gastro-esophageal reflux disease without esophagitis: Secondary | ICD-10-CM

## 2017-09-04 DIAGNOSIS — E119 Type 2 diabetes mellitus without complications: Secondary | ICD-10-CM

## 2017-09-04 DIAGNOSIS — E7800 Pure hypercholesterolemia, unspecified: Secondary | ICD-10-CM | POA: Diagnosis not present

## 2017-09-04 DIAGNOSIS — I10 Essential (primary) hypertension: Secondary | ICD-10-CM | POA: Diagnosis not present

## 2017-09-04 LAB — BAYER DCA HB A1C WAIVED: HB A1C: 5.7 % (ref <7.0)

## 2017-09-04 MED ORDER — OMEPRAZOLE 40 MG PO CPDR: 40.0000 mg | DELAYED_RELEASE_CAPSULE | Freq: Every day | ORAL | 3 refills | Status: DC

## 2017-09-04 NOTE — Progress Notes (Signed)
Subjective:  Patient ID: Alexa Key,  female    DOB: 1951/01/22  Age: 67 y.o.    CC: No chief complaint on file.   HPI Alexa Key presents for  follow-up of hypertension. Patient has no history of headache chest pain or shortness of breath or recent cough. Patient also denies symptoms of TIA such as numbness weakness lateralizing. Patient denies side effects from medication. States taking it regularly.  Patient also  in for follow-up of elevated cholesterol. Doing well without complaints on current medication. Denies side effects  including myalgia and arthralgia and nausea. Also in today for liver function testing. Currently no chest pain, shortness of breath or other cardiovascular related symptoms noted.  Follow-up of diabetes. Patient does check blood sugar at home. Readings run between 80 and 100. Patient denies symptoms such as excessive hunger or urinary frequency, excessive hunger, nausea No significant hypoglycemic spells noted. Medications reviewed. Pt reports taking them regularly. Pt. denies complication/adverse reaction today.  Denies swelling with like to stop taking the Lasix. History Alexa Key has a past medical history of Abnormal cardiac function test (09/22/2014), Acute on chronic diastolic heart failure ([place]), Anxiety, Asthma, CAD (coronary artery disease), COPD (chronic obstructive pulmonary disease) ([place]), CVA (cerebral infarction), Diabetes mellitus without complication ([place]), Diverticulitis (08/04/12), Fibromyalgia, GERD (gastroesophageal reflux disease), H/O echocardiogram, HLD (hyperlipidemia), HTN (hypertension), Ileus ([place]), NSTEMI (non-ST elevated myocardial infarction) ([place]) (08/07/2012), PONV (postoperative nausea and vomiting), PVD (peripheral vascular disease) ([place]), S/P partial lobectomy of lung (1997), Stroke [facility]), Subclavian steal syndrome (2004), and Tobacco abuse.   She has a past surgical history that includes Hysterectomoy; Lumbar spine surgery; Carpal  tunnel release; Coronary angioplasty; coronary stents ; partial lobectomy of lung  (1998); Abdominal hysterectomy; Back surgery; Nephrolithotomy (Right, 12/24/2012); Flexible sigmoidoscopy (07/11/2014); laparotomy (N/A, 10/02/2014); Cardiac catheterization (N/A, 10/15/2014); Colonoscopy (N/A, 01/29/2015); Colostomy closure (N/A, 06/02/2015); and Partial colectomy (N/A, 06/02/2015).   Her family history includes Cirrhosis in her father; Heart attack in her brother; Kidney disease in her mother.She reports that she quit smoking about 2 years ago. Her smoking use included cigarettes. She has a 12.25 pack-year smoking history. She has never used smokeless tobacco. She reports that she does not drink alcohol or use drugs.  Current Outpatient Medications on File Prior to Visit  Medication Sig Dispense Refill  . albuterol (PROVENTIL HFA;VENTOLIN HFA) 108 (90 Base) MCG/ACT inhaler Inhale 2 puffs into the lungs every 6 (six) hours as needed for wheezing or shortness of breath. 1 Inhaler 6  . albuterol (PROVENTIL) (2.5 MG/3ML) 0.083% nebulizer solution Take 3 mLs (2.5 mg total) by nebulization every 6 (six) hours as needed for wheezing or shortness of breath. 120 mL 3  . amLODipine (NORVASC) 10 MG tablet Take 0.5 tablets (5 mg total) by mouth daily. 90 tablet 3  . aspirin 325 MG tablet Take 325 mg by mouth daily.    . budesonide-formoterol (SYMBICORT) 160-4.5 MCG/ACT inhaler Inhale 2 puffs into the lungs 2 (two) times daily. 1 Inhaler 6  . glucose blood test strip Use as instructed 200 each 3  . isosorbide mononitrate (IMDUR) 30 MG 24 hr tablet Take 1 tablet (30 mg total) by mouth daily. 90 tablet 1  . metoprolol succinate (TOPROL-XL) 25 MG 24 hr tablet Take 1 tablet (25 mg total) by mouth daily. 90 tablet 1  . potassium chloride SA (K-DUR,KLOR-CON) 20 MEQ tablet Take 1 tablet (20 mEq total) by mouth daily. For potassium replacement/ supplement 30 tablet 5  . pravastatin (PRAVACHOL) 80  MG tablet TAKE 1 TABLET (80 MG  TOTAL) AT BEDTIME. 90 tablet 1  . Vitamin D, Ergocalciferol, (DRISDOL) 50000 units CAPS capsule Take 1 capsule (50,000 Units total) by mouth every 7 (seven) days. 13 capsule 1   No current facility-administered medications on file prior to visit.     ROS Review of Systems  Constitutional: Negative.   HENT: Negative for congestion.   Eyes: Negative for visual disturbance.  Respiratory: Positive for shortness of breath (All the time, at baseline currently).   Cardiovascular: Negative for chest pain.  Gastrointestinal: Positive for abdominal pain (Some bloating noted primarily in the left upper quadrant minimal relieved with omeprazole and Tums). Negative for constipation, diarrhea, nausea and vomiting.  Genitourinary: Negative for difficulty urinating.  Musculoskeletal: Negative for arthralgias and myalgias.  Neurological: Negative for headaches.  Psychiatric/Behavioral: Negative for sleep disturbance.    Objective:  BP (!) 135/58   Pulse 61   Temp 97.8 F (36.6 C) (Oral)   Ht 4' 11.5" (1.511 m)   Wt 145 lb 4 oz (65.9 kg)   BMI 28.85 kg/m   BP Readings from Last 3 Encounters:  09/04/17 (!) 135/58  06/26/17 123/63  06/07/17 (!) 110/59    Wt Readings from Last 3 Encounters:  09/04/17 145 lb 4 oz (65.9 kg)  06/26/17 141 lb 6 oz (64.1 kg)  06/07/17 140 lb (63.5 kg)     Physical Exam  Constitutional: She is oriented to person, place, and time. She appears well-developed and well-nourished.  HENT:  Head: Normocephalic and atraumatic.  Cardiovascular: Normal rate and regular rhythm.  No murmur heard. Pulmonary/Chest: Effort normal and breath sounds normal.  Abdominal: Soft. Bowel sounds are normal. She exhibits no mass. There is tenderness (Very minimal at left upper quadrant near epigastric area). There is no rebound and no guarding.  Neurological: She is alert and oriented to person, place, and time.  Skin: Skin is warm and dry.  Psychiatric: She has a normal mood and  affect. Her behavior is normal.      Assessment & Plan:   Diagnoses and all orders for this visit:  Diabetes mellitus without complication ([place]) -     Bayer DCA Hb A1c Waived  Gastroesophageal reflux disease without esophagitis  Pure hypercholesterolemia -     CBC with Differential/Platelet -     CMP14+EGFR -     Lipid panel  Vitamin D deficiency -     VITAMIN D 25 Hydroxy (Vit-D Deficiency, Fractures)  Essential hypertension  Chronic bronchitis, unspecified chronic bronchitis type ([place])  Other orders -     omeprazole (PRILOSEC) 40 MG capsule; Take 1 capsule (40 mg total) by mouth daily.   I have discontinued Harmani Neto. Mcbroom's omeprazole and furosemide. I am also having her start on omeprazole. Additionally, I am having her maintain her aspirin, albuterol, glucose blood, isosorbide mononitrate, pravastatin, albuterol, budesonide-formoterol, metoprolol succinate, amLODipine, potassium chloride SA, and Vitamin D (Ergocalciferol).  Meds ordered this encounter  Medications  . omeprazole (PRILOSEC) 40 MG capsule    Sig: Take 1 capsule (40 mg total) by mouth daily.    Dispense:  90 capsule    Refill:  3   She can go ahead and discontinue the furosemide.  But should her baseline shortness of breath take a turn for the worse she should report back to me as soon as possible.  Follow-up: Return in about 3 months (around 12/05/2017).  [name], M.D.

## 2017-09-04 NOTE — Patient Instructions (Signed)

## 2017-09-05 LAB — CMP14+EGFR
ALBUMIN: 3.8 g/dL (ref 3.6–4.8)
ALK PHOS: 123 IU/L — AB (ref 39–117)
ALT: 10 IU/L (ref 0–32)
AST: 12 IU/L (ref 0–40)
Albumin/Globulin Ratio: 1.4 (ref 1.2–2.2)
BUN/Creatinine Ratio: 12 (ref 12–28)
BUN: 20 mg/dL (ref 8–27)
Bilirubin Total: 0.3 mg/dL (ref 0.0–1.2)
CALCIUM: 9.1 mg/dL (ref 8.7–10.3)
CO2: 21 mmol/L (ref 20–29)
CREATININE: 1.64 mg/dL — AB (ref 0.57–1.00)
Chloride: 106 mmol/L (ref 96–106)
GFR calc Af Amer: 37 mL/min/{1.73_m2} — ABNORMAL LOW (ref >59)
GFR calc non Af Amer: 32 mL/min/{1.73_m2} — ABNORMAL LOW (ref >59)
GLOBULIN, TOTAL: 2.7 g/dL (ref 1.5–4.5)
GLUCOSE: 102 mg/dL — AB (ref 65–99)
Potassium: 5.2 mmol/L (ref 3.5–5.2)
SODIUM: 142 mmol/L (ref 134–144)
Total Protein: 6.5 g/dL (ref 6.0–8.5)

## 2017-09-05 LAB — CBC WITH DIFFERENTIAL/PLATELET
BASOS ABS: 0 10*3/uL (ref 0.0–0.2)
Basos: 0 %
EOS (ABSOLUTE): 0.2 10*3/uL (ref 0.0–0.4)
EOS: 2 %
HEMATOCRIT: 36.8 % (ref 34.0–46.6)
HEMOGLOBIN: 12 g/dL (ref 11.1–15.9)
IMMATURE GRANULOCYTES: 0 %
Immature Grans (Abs): 0 10*3/uL (ref 0.0–0.1)
LYMPHS: 30 %
Lymphocytes Absolute: 3.1 10*3/uL (ref 0.7–3.1)
MCH: 29.6 pg (ref 26.6–33.0)
MCHC: 32.6 g/dL (ref 31.5–35.7)
MCV: 91 fL (ref 79–97)
MONOCYTES: 6 %
Monocytes Absolute: 0.6 10*3/uL (ref 0.1–0.9)
Neutrophils Absolute: 6.7 10*3/uL (ref 1.4–7.0)
Neutrophils: 62 %
Platelets: 224 10*3/uL (ref 150–379)
RBC: 4.05 x10E6/uL (ref 3.77–5.28)
RDW: 17 % — ABNORMAL HIGH (ref 12.3–15.4)
WBC: 10.6 10*3/uL (ref 3.4–10.8)

## 2017-09-05 LAB — LIPID PANEL
CHOL/HDL RATIO: 5.8 ratio — AB (ref 0.0–4.4)
CHOLESTEROL TOTAL: 198 mg/dL (ref 100–199)
HDL: 34 mg/dL — ABNORMAL LOW (ref >39)
LDL CALC: 114 mg/dL — AB (ref 0–99)
TRIGLYCERIDES: 252 mg/dL — AB (ref 0–149)
VLDL CHOLESTEROL CAL: 50 mg/dL — AB (ref 5–40)

## 2017-09-05 LAB — VITAMIN D 25 HYDROXY (VIT D DEFICIENCY, FRACTURES): Vit D, 25-Hydroxy: 27.2 ng/mL — ABNORMAL LOW (ref 30.0–100.0)

## 2017-10-04 DIAGNOSIS — R69 Illness, unspecified: Secondary | ICD-10-CM | POA: Diagnosis not present

## 2017-12-05 ENCOUNTER — Encounter: Payer: Self-pay | Admitting: Family Medicine

## 2017-12-05 ENCOUNTER — Ambulatory Visit (INDEPENDENT_AMBULATORY_CARE_PROVIDER_SITE_OTHER): Payer: Medicare HMO | Admitting: Family Medicine

## 2017-12-05 VITALS — BP 132/66 | HR 65 | Temp 96.7°F | Ht 59.5 in | Wt 146.0 lb

## 2017-12-05 DIAGNOSIS — E119 Type 2 diabetes mellitus without complications: Secondary | ICD-10-CM

## 2017-12-05 DIAGNOSIS — Z720 Tobacco use: Secondary | ICD-10-CM | POA: Diagnosis not present

## 2017-12-05 DIAGNOSIS — J42 Unspecified chronic bronchitis: Secondary | ICD-10-CM | POA: Diagnosis not present

## 2017-12-05 DIAGNOSIS — I10 Essential (primary) hypertension: Secondary | ICD-10-CM

## 2017-12-05 DIAGNOSIS — R0600 Dyspnea, unspecified: Secondary | ICD-10-CM | POA: Diagnosis not present

## 2017-12-05 DIAGNOSIS — E785 Hyperlipidemia, unspecified: Secondary | ICD-10-CM

## 2017-12-05 DIAGNOSIS — R7981 Abnormal blood-gas level: Secondary | ICD-10-CM

## 2017-12-05 LAB — BAYER DCA HB A1C WAIVED: HB A1C: 5.9 % (ref <7.0)

## 2017-12-05 MED ORDER — ROSUVASTATIN CALCIUM 20 MG PO TABS: 20.0000 mg | ORAL_TABLET | Freq: Every day | ORAL | 1 refills | Status: DC

## 2017-12-05 MED ORDER — VITAMIN D (ERGOCALCIFEROL) 1.25 MG (50000 UNIT) PO CAPS: 50000.0000 [IU] | ORAL_CAPSULE | ORAL | 1 refills | Status: DC

## 2017-12-05 NOTE — Progress Notes (Signed)
Subjective:  Patient ID: Alexa Key,  female    DOB: April 27, 1951  Age: 67 y.o.    CC: Medical Management of Chronic Issues   HPI Alexa Key presents for  follow-up of hypertension. Patient has no history of headache chest pain or shortness of breath or recent cough. Patient also denies symptoms of TIA such as numbness weakness lateralizing. Patient denies side effects from medication. States taking it regularly.  Patient also  in for follow-up of elevated cholesterol. Doing well without complaints on current medication. Denies side effects  including myalgia and arthralgia and nausea. Also in today for liver function testing. Currently no chest pain, shortness of breath or other cardiovascular related symptoms noted.  Follow-up of diabetes. Patient does not check blood sugar at home.  She doubts her diagnosis. Patient denies symptoms such as excessive hunger or urinary frequency, excessive hunger, nausea No significant hypoglycemic spells noted. Medications reviewed. Pt reports taking them regularly. Pt. denies complication/adverse reaction today.  Patient reports near constant shortness of breath.  She can hardly walk from the exam room to check out without stopping to rest.  Of note is that her pulse oximetry reading is 88% today.  She cannot lay down to sleep.  She sits up in a recliner.  History Alexa Key has a past medical history of Abnormal cardiac function test (09/22/2014), Acute on chronic diastolic heart failure ([place]), Anxiety, Asthma, CAD (coronary artery disease), Colovaginal fistula s/p colectomy/colostomy/repair 10/02/2014 (11/15/2012), COPD (chronic obstructive pulmonary disease) ([place]), CVA (cerebral infarction), Diabetes mellitus without complication ([place]), Diverticulitis (08/04/12), Fibromyalgia, GERD (gastroesophageal reflux disease), H/O echocardiogram, HLD (hyperlipidemia), HTN (hypertension), Ileus ([place]), NSTEMI (non-ST elevated myocardial infarction) ([place]) (08/07/2012), PONV  (postoperative nausea and vomiting), PVD (peripheral vascular disease) ([place]), S/P partial lobectomy of lung (1997), Stroke [facility]), Subclavian steal syndrome (2004), and Tobacco abuse.   She has a past surgical history that includes Hysterectomoy; Lumbar spine surgery; Carpal tunnel release; Coronary angioplasty; coronary stents ; partial lobectomy of lung  (1998); Abdominal hysterectomy; Back surgery; Nephrolithotomy (Right, 12/24/2012); Flexible sigmoidoscopy (07/11/2014); laparotomy (N/A, 10/02/2014); Cardiac catheterization (N/A, 10/15/2014); Colonoscopy (N/A, 01/29/2015); Colostomy closure (N/A, 06/02/2015); and Partial colectomy (N/A, 06/02/2015).   Her family history includes Cirrhosis in her father; Heart attack in her brother; Kidney disease in her mother.She reports that she quit smoking about 3 years ago. Her smoking use included cigarettes. She has a 12.25 pack-year smoking history. She has never used smokeless tobacco. She reports that she does not drink alcohol or use drugs.  Current Outpatient Medications on File Prior to Visit  Medication Sig Dispense Refill  . albuterol (PROVENTIL HFA;VENTOLIN HFA) 108 (90 Base) MCG/ACT inhaler Inhale 2 puffs into the lungs every 6 (six) hours as needed for wheezing or shortness of breath. 1 Inhaler 6  . albuterol (PROVENTIL) (2.5 MG/3ML) 0.083% nebulizer solution Take 3 mLs (2.5 mg total) by nebulization every 6 (six) hours as needed for wheezing or shortness of breath. 120 mL 3  . amLODipine (NORVASC) 10 MG tablet Take 0.5 tablets (5 mg total) by mouth daily. 90 tablet 3  . aspirin 325 MG tablet Take 325 mg by mouth daily.    . budesonide-formoterol (SYMBICORT) 160-4.5 MCG/ACT inhaler Inhale 2 puffs into the lungs 2 (two) times daily. 1 Inhaler 6  . furosemide (LASIX) 40 MG tablet     . glucose blood test strip Use as instructed 200 each 3  . isosorbide mononitrate (IMDUR) 30 MG 24 hr tablet Take 1 tablet (30 mg total) by  mouth daily. 90 tablet 1  .  metoprolol succinate (TOPROL-XL) 25 MG 24 hr tablet Take 1 tablet (25 mg total) by mouth daily. 90 tablet 1  . omeprazole (PRILOSEC) 40 MG capsule Take 1 capsule (40 mg total) by mouth daily. 90 capsule 3  . potassium chloride SA (K-DUR,KLOR-CON) 20 MEQ tablet Take 1 tablet (20 mEq total) by mouth daily. For potassium replacement/ supplement 30 tablet 5   No current facility-administered medications on file prior to visit.     ROS Review of Systems  Constitutional: Negative.   HENT: Negative for congestion.   Eyes: Negative for visual disturbance.  Respiratory: Positive for shortness of breath (Severe, constant.).   Cardiovascular: Negative for chest pain.  Gastrointestinal: Negative for abdominal pain, constipation, diarrhea, nausea and vomiting.  Genitourinary: Negative for difficulty urinating.  Musculoskeletal: Negative for arthralgias and myalgias.  Neurological: Negative for headaches.  Psychiatric/Behavioral: Negative for sleep disturbance.    Objective:  BP 132/66   Pulse 65   Temp (!) 96.7 F (35.9 C) (Oral)   Ht 4' 11.5" (1.511 m)   Wt 146 lb (66.2 kg)   SpO2 (!) 88%   BMI 28.99 kg/m   BP Readings from Last 3 Encounters:  12/05/17 132/66  09/04/17 (!) 135/58  06/26/17 123/63    Wt Readings from Last 3 Encounters:  12/05/17 146 lb (66.2 kg)  09/04/17 145 lb 4 oz (65.9 kg)  06/26/17 141 lb 6 oz (64.1 kg)     Physical Exam  Constitutional: She is oriented to person, place, and time. She appears well-developed and well-nourished. She appears distressed.  HENT:  Head: Normocephalic.  Her lips are cyanotic at rest.  Her pulse oximetry reading today was 88% at rest.  This was taken on room air.  Cardiovascular: Normal rate and regular rhythm.  Pulmonary/Chest: She is in respiratory distress. She has wheezes.  Neurological: She is alert and oriented to person, place, and time.  Skin: Skin is warm and dry.  Psychiatric: She has a normal mood and affect.         Assessment & Plan:   Torrey was seen today for medical management of chronic issues.  Diagnoses and all orders for this visit:  Diabetes mellitus without complication ([place]) -     CMP14+EGFR -     Bayer DCA Hb A1c Waived  Tobacco user  Dyslipidemia  Essential hypertension  Chronic bronchitis, unspecified chronic bronchitis type ([place])  Other orders -     Vitamin D, Ergocalciferol, (DRISDOL) 50000 units CAPS capsule; Take 1 capsule (50,000 Units total) by mouth every 7 (seven) days. -     rosuvastatin (CRESTOR) 20 MG tablet; Take 1 tablet (20 mg total) by mouth daily.   I have discontinued Marvine Encalade. Downs's pravastatin. I am also having her start on rosuvastatin. Additionally, I am having her maintain her aspirin, albuterol, glucose blood, isosorbide mononitrate, albuterol, budesonide-formoterol, metoprolol succinate, amLODipine, potassium chloride SA, omeprazole, furosemide, and Vitamin D (Ergocalciferol).  Meds ordered this encounter  Medications  . Vitamin D, Ergocalciferol, (DRISDOL) 50000 units CAPS capsule    Sig: Take 1 capsule (50,000 Units total) by mouth every 7 (seven) days.    Dispense:  13 capsule    Refill:  1  . rosuvastatin (CRESTOR) 20 MG tablet    Sig: Take 1 tablet (20 mg total) by mouth daily.    Dispense:  90 tablet    Refill:  1     Follow-up: Return in about 3 months (around 03/07/2018),  or if symptoms worsen or fail to improve.  [name], M.D.

## 2017-12-05 NOTE — Patient Instructions (Signed)

## 2017-12-06 ENCOUNTER — Other Ambulatory Visit: Payer: Self-pay | Admitting: Family Medicine

## 2017-12-06 LAB — CMP14+EGFR
ALT: 6 IU/L (ref 0–32)
AST: 8 IU/L (ref 0–40)
Albumin/Globulin Ratio: 1.6 (ref 1.2–2.2)
Albumin: 4 g/dL (ref 3.6–4.8)
Alkaline Phosphatase: 118 IU/L — ABNORMAL HIGH (ref 39–117)
BUN/Creatinine Ratio: 9 — ABNORMAL LOW (ref 12–28)
BUN: 15 mg/dL (ref 8–27)
Bilirubin Total: 0.4 mg/dL (ref 0.0–1.2)
CALCIUM: 8.7 mg/dL (ref 8.7–10.3)
CO2: 22 mmol/L (ref 20–29)
CREATININE: 1.59 mg/dL — AB (ref 0.57–1.00)
Chloride: 105 mmol/L (ref 96–106)
GFR calc non Af Amer: 33 mL/min/{1.73_m2} — ABNORMAL LOW (ref >59)
GFR, EST AFRICAN AMERICAN: 38 mL/min/{1.73_m2} — AB (ref >59)
Globulin, Total: 2.5 g/dL (ref 1.5–4.5)
Glucose: 83 mg/dL (ref 65–99)
Potassium: 4.3 mmol/L (ref 3.5–5.2)
Sodium: 145 mmol/L — ABNORMAL HIGH (ref 134–144)
TOTAL PROTEIN: 6.5 g/dL (ref 6.0–8.5)

## 2017-12-06 MED ORDER — BUDESONIDE-FORMOTEROL FUMARATE 160-4.5 MCG/ACT IN AERO: 2.0000 | INHALATION_SPRAY | Freq: Two times a day (BID) | RESPIRATORY_TRACT | 6 refills | Status: DC

## 2017-12-06 NOTE — Telephone Encounter (Signed)
Pt aware symbicort sent to [facility] and advised pt to monitor her blood sugars and to let us know if there is a drastic change with the crestor. Pt voiced understanding.

## 2017-12-07 ENCOUNTER — Telehealth: Payer: Self-pay | Admitting: Family Medicine

## 2017-12-07 NOTE — Addendum Note (Signed)
Addended by: Marylin Crosby on: 12/07/2017 05:02 PM   Modules accepted: Orders

## 2017-12-07 NOTE — Telephone Encounter (Signed)
X °

## 2017-12-08 NOTE — Telephone Encounter (Signed)
Pt aware order is being faxed today to [facility]

## 2017-12-09 DIAGNOSIS — M6281 Muscle weakness (generalized): Secondary | ICD-10-CM | POA: Diagnosis not present

## 2017-12-09 DIAGNOSIS — R6521 Severe sepsis with septic shock: Secondary | ICD-10-CM | POA: Diagnosis not present

## 2017-12-09 DIAGNOSIS — J449 Chronic obstructive pulmonary disease, unspecified: Secondary | ICD-10-CM | POA: Diagnosis not present

## 2017-12-11 ENCOUNTER — Telehealth: Payer: Self-pay | Admitting: Family Medicine

## 2017-12-11 ENCOUNTER — Other Ambulatory Visit: Payer: Self-pay

## 2017-12-11 MED ORDER — VITAMIN D (ERGOCALCIFEROL) 1.25 MG (50000 UNIT) PO CAPS: 50000.0000 [IU] | ORAL_CAPSULE | ORAL | 1 refills | Status: DC

## 2017-12-11 NOTE — Telephone Encounter (Signed)
Left detailed message on patients daughters voicemail that rx was sent to [facility].

## 2017-12-12 ENCOUNTER — Other Ambulatory Visit: Payer: Self-pay | Admitting: *Deleted

## 2017-12-12 DIAGNOSIS — I10 Essential (primary) hypertension: Secondary | ICD-10-CM

## 2017-12-12 MED ORDER — AMLODIPINE BESYLATE 10 MG PO TABS: 5.0000 mg | ORAL_TABLET | Freq: Every day | ORAL | 1 refills | Status: DC

## 2017-12-27 ENCOUNTER — Ambulatory Visit (INDEPENDENT_AMBULATORY_CARE_PROVIDER_SITE_OTHER): Payer: Medicare HMO | Admitting: Family Medicine

## 2017-12-27 ENCOUNTER — Ambulatory Visit (INDEPENDENT_AMBULATORY_CARE_PROVIDER_SITE_OTHER): Payer: Medicare HMO

## 2017-12-27 ENCOUNTER — Encounter: Payer: Self-pay | Admitting: Family Medicine

## 2017-12-27 VITALS — BP 139/75 | HR 68 | Temp 97.6°F | Ht 59.0 in | Wt 149.0 lb

## 2017-12-27 DIAGNOSIS — R0989 Other specified symptoms and signs involving the circulatory and respiratory systems: Secondary | ICD-10-CM

## 2017-12-27 DIAGNOSIS — R0602 Shortness of breath: Secondary | ICD-10-CM | POA: Diagnosis not present

## 2017-12-27 DIAGNOSIS — R05 Cough: Secondary | ICD-10-CM

## 2017-12-27 DIAGNOSIS — R0689 Other abnormalities of breathing: Secondary | ICD-10-CM

## 2017-12-27 DIAGNOSIS — J441 Chronic obstructive pulmonary disease with (acute) exacerbation: Secondary | ICD-10-CM

## 2017-12-27 DIAGNOSIS — R059 Cough, unspecified: Secondary | ICD-10-CM

## 2017-12-27 MED ORDER — DOXYCYCLINE HYCLATE 100 MG PO TABS: 100.0000 mg | ORAL_TABLET | Freq: Two times a day (BID) | ORAL | 0 refills | Status: AC

## 2017-12-27 MED ORDER — PREDNISONE 20 MG PO TABS: 40.0000 mg | ORAL_TABLET | Freq: Every day | ORAL | 0 refills | Status: AC

## 2017-12-27 NOTE — Patient Instructions (Signed)
Start the prednisone tomorrow otherwise you may be up all night from the medication.  You can start your first pill of doxycycline tonight.  Remember to take this with your supper.  The medicine can cause nausea if you do not eat with it.  We discussed using your albuterol inhaler 2 puffs every 6 hours for the next 2 days scheduled.  If your symptoms worsen for any reason, please seek immediate medical attention.  Otherwise follow-up with your PCP in 1 month for recheck of your COPD.   Chronic Obstructive Pulmonary Disease Exacerbation Chronic obstructive pulmonary disease (COPD) is a common lung problem. In COPD, the flow of air from the lungs is limited. COPD exacerbations are times that breathing gets worse and you need extra treatment. Without treatment they can be life threatening. If they happen often, your lungs can become more damaged. If your COPD gets worse, your doctor may treat you with:  Medicines.  Oxygen.  Different ways to clear your airway, such as using a mask.  Follow these instructions at home:  Do not smoke.  Avoid tobacco smoke and other things that bother your lungs.  If given, take your antibiotic medicine as told. Finish the medicine even if you start to feel better.  Only take medicines as told by your doctor.  Drink enough fluids to keep your pee (urine) clear or pale yellow (unless your doctor has told you not to).  Use a cool mist machine (vaporizer).  If you use oxygen or a machine that turns liquid medicine into a mist (nebulizer), continue to use them as told.  Keep up with shots (vaccinations) as told by your doctor.  Exercise regularly.  Eat healthy foods.  Keep all doctor visits as told. Get help right away if:  You are very short of breath and it gets worse.  You have trouble talking.  You have bad chest pain.  You have blood in your spit (sputum).  You have a fever.  You keep throwing up (vomiting).  You feel weak, or you pass out  (faint).  You feel confused.  You keep getting worse. This information is not intended to replace advice given to you by your health care provider. Make sure you discuss any questions you have with your health care provider. Document Released: 04/07/2011 Document Revised: 09/24/2015 Document Reviewed: 12/21/2012 Elsevier Interactive Patient Education  2017 [employer].

## 2017-12-27 NOTE — Progress Notes (Signed)
Subjective: CC: Bronchitis PCP: [name], MD Alexa Key is a 67 y.o. female presenting to clinic today for:  1.  Bronchitis Patient reports a 1/2-week history of worsening cough that is productive.  She notes associated wheeze and shortness of breath but states that her shortness of breath is at baseline.  She does use home O2 but notes that she has not gotten in the habit of bringing around with her and does not wear it today.  Denies any fevers, chills, hemoptysis.  She reports compliance with her inhalers and states that she has been using her albuterol as directed.  Denies any recent COPD exacerbations.  She is a former smoker.  No chest pain.   ROS: Per HPI   Past Medical History:  Diagnosis Date  . Abnormal cardiac function test 09/22/2014  . Acute on chronic diastolic heart failure ([place])   . Anxiety   . Asthma   . CAD (coronary artery disease)    a. 07/2012 s/p MI ->not cath candidate 2/2 AKI; b. 09/2012 Neg MV;  c. 08/2014 [facility]: EF 45-54%, + ischemia, intermittent risk study.  . Colovaginal fistula s/p colectomy/colostomy/repair 10/02/2014 11/15/2012  . COPD (chronic obstructive pulmonary disease) ([place])   . CVA (cerebral infarction)    TIAx2  . Diabetes mellitus without complication ([place])    on no meds   . Diverticulitis 08/04/12  . Fibromyalgia   . GERD (gastroesophageal reflux disease)   . H/O echocardiogram    a. 07/2012 Echo: EF 50-55%, grade 2 DD, mild LVH.  Marland Kitchen HLD (hyperlipidemia)   . HTN (hypertension)   . Ileus ([place])   . NSTEMI (non-ST elevated myocardial infarction) ([place]) 08/07/2012  . PONV (postoperative nausea and vomiting)   . PVD (peripheral vascular disease) ([place])   . S/P partial lobectomy of lung 1997   per [facility], incidental finding, path c/w benign lesion  . Stroke ([place])   . Subclavian steal syndrome 2004   Dr. Kellie Simmering. Angioplasty according to patient  . Tobacco abuse     Current Outpatient Medications:  .  albuterol (PROVENTIL  HFA;VENTOLIN HFA) 108 (90 Base) MCG/ACT inhaler, Inhale 2 puffs into the lungs every 6 (six) hours as needed for wheezing or shortness of breath., Disp: 1 Inhaler, Rfl: 6 .  albuterol (PROVENTIL) (2.5 MG/3ML) 0.083% nebulizer solution, Take 3 mLs (2.5 mg total) by nebulization every 6 (six) hours as needed for wheezing or shortness of breath., Disp: 120 mL, Rfl: 3 .  amLODipine (NORVASC) 10 MG tablet, Take 0.5 tablets (5 mg total) by mouth daily., Disp: 90 tablet, Rfl: 1 .  aspirin 325 MG tablet, Take 325 mg by mouth daily., Disp: , Rfl:  .  budesonide-formoterol (SYMBICORT) 160-4.5 MCG/ACT inhaler, Inhale 2 puffs into the lungs 2 (two) times daily., Disp: 1 Inhaler, Rfl: 6 .  furosemide (LASIX) 40 MG tablet, , Disp: , Rfl:  .  glucose blood test strip, Use as instructed, Disp: 200 each, Rfl: 3 .  isosorbide mononitrate (IMDUR) 30 MG 24 hr tablet, Take 1 tablet (30 mg total) by mouth daily., Disp: 90 tablet, Rfl: 1 .  metoprolol succinate (TOPROL-XL) 25 MG 24 hr tablet, Take 1 tablet (25 mg total) by mouth daily., Disp: 90 tablet, Rfl: 1 .  omeprazole (PRILOSEC) 40 MG capsule, Take 1 capsule (40 mg total) by mouth daily., Disp: 90 capsule, Rfl: 3 .  potassium chloride SA (K-DUR,KLOR-CON) 20 MEQ tablet, Take 1 tablet daily For potassium replacement/ supplement, Disp: 30 tablet, Rfl: 5 .  rosuvastatin (CRESTOR) 20 MG tablet, Take 1 tablet (20 mg total) by mouth daily., Disp: 90 tablet, Rfl: 1 .  Vitamin D, Ergocalciferol, (DRISDOL) 50000 units CAPS capsule, Take 1 capsule (50,000 Units total) by mouth every 7 (seven) days., Disp: 13 capsule, Rfl: 1 Social History   Socioeconomic History  . Marital status: Divorced    Spouse name: Not on file  . Number of children: 2  . Years of education: 23  . Highest education level: 11th grade  Occupational History  . Occupation: retired    Comment: Copy  . Financial resource strain: Not hard at all  . Food insecurity:    Worry: Never  true    Inability: Never true  . Transportation needs:    Medical: No    Non-medical: No  Tobacco Use  . Smoking status: Former Smoker    Packs/day: 0.25    Years: 49.00    Pack years: 12.25    Types: Cigarettes    Last attempt to quit: 09/29/2014    Years since quitting: 3.2  . Smokeless tobacco: Never Used  Substance and Sexual Activity  . Alcohol use: No    Alcohol/week: 0.0 standard drinks  . Drug use: No  . Sexual activity: Not Currently  Lifestyle  . Physical activity:    Days per week: 0 days    Minutes per session: 0 min  . Stress: Only a little  Relationships  . Social connections:    Talks on phone: More than three times a week    Gets together: More than three times a week    Attends religious service: More than 4 times per year    Active member of club or organization: Yes    Attends meetings of clubs or organizations: More than 4 times per year    Relationship status: Divorced  . Intimate partner violence:    Fear of current or ex partner: No    Emotionally abused: No    Physically abused: No    Forced sexual activity: No  Other Topics Concern  . Not on file  Social History Narrative   Patient has 2 adult daughters and 6 grandchildren. She is divorced and lives alone in a ground floor apartment.    Family History  Problem Relation Age of Onset  . Kidney disease Mother   . Cirrhosis Father   . Heart attack Brother     Objective: Office vital signs reviewed. BP 139/75   Pulse 68   Temp 97.6 F (36.4 C) (Oral)   Ht 4\' 11"  (1.499 m)   Wt 149 lb (67.6 kg)   SpO2 (!) 88%   BMI 30.09 kg/m   Physical Examination:  General: Awake, alert, well nourished, nontoxic. No acute distress HEENT: MMM Cardio: regular rate and rhythm, S1S2 heard, no murmurs appreciated Pulm: Globally decreased breath sounds w/ poor breathsounds in the lower right lung fields. Mild expiratory wheeze.  No rhonchi or rales; normal work of breathing on room air while seated but  does become dyspneic with ambulation.  Dg Chest 2 View  Result Date: 12/27/2017 CLINICAL DATA:  Decreased breath sounds on the right, shortness of breath EXAM: CHEST - 2 VIEW COMPARISON:  05/29/2017 FINDINGS: Cardiac shadow is enlarged. The lungs are well aerated bilaterally and stable. Elevation the right hemidiaphragm and chronic blunting of the right costophrenic angle is seen. Postsurgical changes on the right are noted as well. No acute bony abnormality is seen. IMPRESSION: Chronic changes on the  right stable from the previous exam. No acute abnormality noted. Electronically Signed   By: Inez Catalina M.D.   On: 12/27/2017 16:46   Assessment/ Plan: 67 y.o. female   1. COPD exacerbation (HCC) Pulse ox is 88% on room air.  Chest x-ray was obtained given significant decreased breath sounds in right lower lung fields.  No acute pulmonary infiltrates were appreciated.  She apparently had chronic changes within the right lung fields that are stable per radiologist review.  Per her report she supposed to be on oxygen.  Given productive cough that has been worsening, will treat with prednisone and antibiotics for COPD exacerbation.  Prednisone burst prescribed.  Doxycycline prescribed p.o. twice daily for the next 7 days.  I recommend that she use the albuterol inhaler 2 puffs every 6 hours for the next 2 days then as needed as directed.  Reasons for emergent evaluation the emergency department discussed.  Patient was good understanding will follow with PCP in 1 month for recheck of COPD.  Consider escalating COPD medications. - DG Chest 2 View; Future  2. Decreased breath sounds at right lung base - DG Chest 2 View; Future   Orders Placed This Encounter  Procedures  . DG Chest 2 View    Standing Status:   Future    Number of Occurrences:   1    Standing Expiration Date:   02/27/2019    Order Specific Question:   Reason for Exam (SYMPTOM  OR DIAGNOSIS REQUIRED)    Answer:   COPD, productive  cough. decreased breathsounds on Right    Order Specific Question:   Preferred imaging location?    Answer:   Internal    Order Specific Question:   Radiology Contrast Protocol - do NOT remove file path    Answer:   [email or web address]   Meds ordered this encounter  Medications  . doxycycline (VIBRA-TABS) 100 MG tablet    Sig: Take 1 tablet (100 mg total) by mouth 2 (two) times daily for 7 days.    Dispense:  14 tablet    Refill:  0  . predniSONE (DELTASONE) 20 MG tablet    Sig: Take 2 tablets (40 mg total) by mouth daily with breakfast for 5 days.    Dispense:  10 tablet    Refill:  0     Kalil Woessner Windell Moulding, DO [place] [phone]

## 2018-01-09 DIAGNOSIS — R6521 Severe sepsis with septic shock: Secondary | ICD-10-CM | POA: Diagnosis not present

## 2018-01-09 DIAGNOSIS — J449 Chronic obstructive pulmonary disease, unspecified: Secondary | ICD-10-CM | POA: Diagnosis not present

## 2018-01-09 DIAGNOSIS — M6281 Muscle weakness (generalized): Secondary | ICD-10-CM | POA: Diagnosis not present

## 2018-01-10 ENCOUNTER — Other Ambulatory Visit: Payer: Self-pay | Admitting: Family Medicine

## 2018-01-10 DIAGNOSIS — I10 Essential (primary) hypertension: Secondary | ICD-10-CM | POA: Diagnosis not present

## 2018-01-10 DIAGNOSIS — Z8249 Family history of ischemic heart disease and other diseases of the circulatory system: Secondary | ICD-10-CM | POA: Diagnosis not present

## 2018-01-10 DIAGNOSIS — J449 Chronic obstructive pulmonary disease, unspecified: Secondary | ICD-10-CM | POA: Diagnosis not present

## 2018-01-10 DIAGNOSIS — Z823 Family history of stroke: Secondary | ICD-10-CM | POA: Diagnosis not present

## 2018-01-10 DIAGNOSIS — R69 Illness, unspecified: Secondary | ICD-10-CM | POA: Diagnosis not present

## 2018-01-10 DIAGNOSIS — I2510 Atherosclerotic heart disease of native coronary artery without angina pectoris: Secondary | ICD-10-CM | POA: Diagnosis not present

## 2018-01-10 DIAGNOSIS — R609 Edema, unspecified: Secondary | ICD-10-CM | POA: Diagnosis not present

## 2018-01-10 DIAGNOSIS — I252 Old myocardial infarction: Secondary | ICD-10-CM | POA: Diagnosis not present

## 2018-01-10 DIAGNOSIS — K219 Gastro-esophageal reflux disease without esophagitis: Secondary | ICD-10-CM | POA: Diagnosis not present

## 2018-01-10 DIAGNOSIS — M810 Age-related osteoporosis without current pathological fracture: Secondary | ICD-10-CM | POA: Diagnosis not present

## 2018-01-29 DIAGNOSIS — R69 Illness, unspecified: Secondary | ICD-10-CM | POA: Diagnosis not present

## 2018-02-08 DIAGNOSIS — J449 Chronic obstructive pulmonary disease, unspecified: Secondary | ICD-10-CM | POA: Diagnosis not present

## 2018-02-08 DIAGNOSIS — M6281 Muscle weakness (generalized): Secondary | ICD-10-CM | POA: Diagnosis not present

## 2018-02-08 DIAGNOSIS — R6521 Severe sepsis with septic shock: Secondary | ICD-10-CM | POA: Diagnosis not present

## 2018-03-07 ENCOUNTER — Encounter: Payer: Self-pay | Admitting: Family Medicine

## 2018-03-07 ENCOUNTER — Ambulatory Visit (INDEPENDENT_AMBULATORY_CARE_PROVIDER_SITE_OTHER): Payer: Medicare HMO | Admitting: Family Medicine

## 2018-03-07 VITALS — BP 137/69 | HR 59 | Temp 97.6°F | Ht 61.0 in | Wt 147.8 lb

## 2018-03-07 DIAGNOSIS — E785 Hyperlipidemia, unspecified: Secondary | ICD-10-CM

## 2018-03-07 DIAGNOSIS — E559 Vitamin D deficiency, unspecified: Secondary | ICD-10-CM

## 2018-03-07 DIAGNOSIS — R062 Wheezing: Secondary | ICD-10-CM | POA: Diagnosis not present

## 2018-03-07 DIAGNOSIS — J439 Emphysema, unspecified: Secondary | ICD-10-CM | POA: Diagnosis not present

## 2018-03-07 DIAGNOSIS — E119 Type 2 diabetes mellitus without complications: Secondary | ICD-10-CM

## 2018-03-07 DIAGNOSIS — I10 Essential (primary) hypertension: Secondary | ICD-10-CM

## 2018-03-07 DIAGNOSIS — R69 Illness, unspecified: Secondary | ICD-10-CM | POA: Diagnosis not present

## 2018-03-07 DIAGNOSIS — J441 Chronic obstructive pulmonary disease with (acute) exacerbation: Secondary | ICD-10-CM | POA: Diagnosis not present

## 2018-03-07 LAB — BAYER DCA HB A1C WAIVED: HB A1C (BAYER DCA - WAIVED): 6 % (ref <7.0)

## 2018-03-07 MED ORDER — GLUCOSE BLOOD VI STRP: ORAL_STRIP | 3 refills | Status: AC

## 2018-03-07 MED ORDER — ALBUTEROL SULFATE HFA 108 (90 BASE) MCG/ACT IN AERS: 2.0000 | INHALATION_SPRAY | Freq: Four times a day (QID) | RESPIRATORY_TRACT | 6 refills | Status: AC | PRN

## 2018-03-07 MED ORDER — VITAMIN D (ERGOCALCIFEROL) 1.25 MG (50000 UNIT) PO CAPS: 50000.0000 [IU] | ORAL_CAPSULE | ORAL | 1 refills | Status: AC

## 2018-03-07 MED ORDER — ALBUTEROL SULFATE (2.5 MG/3ML) 0.083% IN NEBU: 2.5000 mg | INHALATION_SOLUTION | Freq: Four times a day (QID) | RESPIRATORY_TRACT | 3 refills | Status: AC | PRN

## 2018-03-07 MED ORDER — BUDESONIDE-FORMOTEROL FUMARATE 160-4.5 MCG/ACT IN AERO: 2.0000 | INHALATION_SPRAY | Freq: Two times a day (BID) | RESPIRATORY_TRACT | 6 refills | Status: AC

## 2018-03-07 MED ORDER — OMEPRAZOLE 40 MG PO CPDR: 40.0000 mg | DELAYED_RELEASE_CAPSULE | Freq: Every day | ORAL | 3 refills | Status: AC

## 2018-03-07 MED ORDER — AMLODIPINE BESYLATE 10 MG PO TABS: 5.0000 mg | ORAL_TABLET | Freq: Every day | ORAL | 1 refills | Status: AC

## 2018-03-07 MED ORDER — METOPROLOL SUCCINATE ER 25 MG PO TB24: 25.0000 mg | ORAL_TABLET | Freq: Every day | ORAL | 1 refills | Status: AC

## 2018-03-07 MED ORDER — POTASSIUM CHLORIDE CRYS ER 20 MEQ PO TBCR: EXTENDED_RELEASE_TABLET | ORAL | 5 refills | Status: AC

## 2018-03-07 MED ORDER — ISOSORBIDE MONONITRATE ER 30 MG PO TB24: 30.0000 mg | ORAL_TABLET | Freq: Every day | ORAL | 1 refills | Status: AC

## 2018-03-07 MED ORDER — ROSUVASTATIN CALCIUM 20 MG PO TABS: 20.0000 mg | ORAL_TABLET | Freq: Every day | ORAL | 1 refills | Status: AC

## 2018-03-07 MED ORDER — FUROSEMIDE 40 MG PO TABS: 40.0000 mg | ORAL_TABLET | Freq: Every day | ORAL | 4 refills | Status: AC

## 2018-03-08 LAB — LIPID PANEL
CHOL/HDL RATIO: 2.8 ratio (ref 0.0–4.4)
Cholesterol, Total: 113 mg/dL (ref 100–199)
HDL: 40 mg/dL (ref >39)
LDL CALC: 46 mg/dL (ref 0–99)
Triglycerides: 137 mg/dL (ref 0–149)
VLDL Cholesterol Cal: 27 mg/dL (ref 5–40)

## 2018-03-08 LAB — VITAMIN D 25 HYDROXY (VIT D DEFICIENCY, FRACTURES): VIT D 25 HYDROXY: 55.3 ng/mL (ref 30.0–100.0)

## 2018-03-08 LAB — CMP14+EGFR
A/G RATIO: 1.6 (ref 1.2–2.2)
ALT: 7 IU/L (ref 0–32)
AST: 11 IU/L (ref 0–40)
Albumin: 3.9 g/dL (ref 3.6–4.8)
Alkaline Phosphatase: 101 IU/L (ref 39–117)
BUN/Creatinine Ratio: 9 — ABNORMAL LOW (ref 12–28)
BUN: 15 mg/dL (ref 8–27)
Bilirubin Total: 0.3 mg/dL (ref 0.0–1.2)
CALCIUM: 9.4 mg/dL (ref 8.7–10.3)
CO2: 26 mmol/L (ref 20–29)
Chloride: 103 mmol/L (ref 96–106)
Creatinine, Ser: 1.59 mg/dL — ABNORMAL HIGH (ref 0.57–1.00)
GFR, EST AFRICAN AMERICAN: 38 mL/min/{1.73_m2} — AB (ref >59)
GFR, EST NON AFRICAN AMERICAN: 33 mL/min/{1.73_m2} — AB (ref >59)
GLOBULIN, TOTAL: 2.4 g/dL (ref 1.5–4.5)
Glucose: 109 mg/dL — ABNORMAL HIGH (ref 65–99)
POTASSIUM: 5.3 mmol/L — AB (ref 3.5–5.2)
SODIUM: 144 mmol/L (ref 134–144)
TOTAL PROTEIN: 6.3 g/dL (ref 6.0–8.5)

## 2018-03-09 NOTE — Progress Notes (Signed)
Hello Corneisha,  Your lab result is normal.Some minor variations that are not significant are commonly marked abnormal, but do not represent any medical problem for you.  Best regards, [name], M.D.

## 2018-03-11 ENCOUNTER — Encounter: Payer: Self-pay | Admitting: Family Medicine

## 2018-03-11 DIAGNOSIS — R6521 Severe sepsis with septic shock: Secondary | ICD-10-CM | POA: Diagnosis not present

## 2018-03-11 DIAGNOSIS — M6281 Muscle weakness (generalized): Secondary | ICD-10-CM | POA: Diagnosis not present

## 2018-03-11 DIAGNOSIS — J449 Chronic obstructive pulmonary disease, unspecified: Secondary | ICD-10-CM | POA: Diagnosis not present

## 2018-03-11 NOTE — Progress Notes (Signed)
Subjective:  Patient ID: Alexa Key,  female    DOB: [birth year]  Age: 67 y.o.    CC: Medical Management of Chronic Issues   HPI Alexa Key presents for  follow-up of hypertension. Patient has no history of headache chest pain or shortness of breath or recent cough. Patient also denies symptoms of TIA such as numbness weakness lateralizing. Patient denies side effects from medication. States taking it regularly.  Patient also  in for follow-up of elevated cholesterol. Doing well without complaints on current medication. Denies side effects  including myalgia and arthralgia and nausea. Also in today for liver function testing. Currently no chest pain, shortness of breath or other cardiovascular related symptoms noted.  Follow-up of diabetes. Patient does check blood sugar at home. Readings run between 90 and 160 Patient denies symptoms such as excessive hunger or urinary frequency, excessive hunger, nausea No significant hypoglycemic spells noted. Medications reviewed. Pt reports taking them regularly. Pt. denies complication/adverse reaction today.    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   She has a past surgical history that includes  Hysterectomoy; Lumbar spine surgery; Carpal tunnel release; Coronary angioplasty; coronary stents ; partial lobectomy of lung  (1998); Abdominal hysterectomy; Back surgery; Nephrolithotomy (Right, 12/24/2012); Flexible sigmoidoscopy (07/11/2014); laparotomy (N/A, 10/02/2014); Cardiac catheterization (N/A, 10/15/2014); Colonoscopy (N/A, 01/29/2015); Colostomy closure (N/A, 06/02/2015); and Partial colectomy (N/A, 06/02/2015).   Her family history includes Cirrhosis in her father; Heart attack in her brother; Kidney disease in her mother.She reports that she quit smoking about 3 years ago. Her smoking use included cigarettes. She has a 12.25 pack-year smoking history. She has never used smokeless tobacco. She reports that she does not drink alcohol or use drugs.  Current Outpatient Medications on File Prior to Visit  Medication Sig Dispense Refill  . aspirin 325 MG tablet Take 325 mg by mouth daily.     No current facility-administered medications on file prior to visit.     ROS Review of Systems  Constitutional: Negative.   HENT: Negative for congestion.   Eyes: Negative for visual disturbance.  Respiratory: Negative for shortness of breath.   Cardiovascular: Negative for chest pain.  Gastrointestinal: Negative for abdominal pain, constipation, diarrhea, nausea and vomiting.  Genitourinary: Negative for difficulty urinating.  Musculoskeletal: Negative for arthralgias and myalgias.  Neurological: Negative for headaches.  Psychiatric/Behavioral: Negative for sleep disturbance.    Objective:  BP 137/69   Pulse (!) 59   Temp 97.6 F (36.4 C) (Oral)   Ht '5\' 1"'  (1.549 m)   Wt 147 lb 12.8 oz (67 kg)   BMI 27.93 kg/m   BP Readings from Last 3 Encounters:  03/07/18 137/69  12/27/17 139/75  12/05/17 132/66    Wt Readings from Last 3 Encounters:  03/07/18 147 lb 12.8 oz (67 kg)  12/27/17 149 lb (67.6 kg)  12/05/17 146 lb (66.2 kg)     Physical Exam  Constitutional: She is oriented to  person, place, and time. She appears  well-developed and well-nourished. No distress.  HENT:  Head: Normocephalic and atraumatic.  Right Ear: External ear normal.  Left Ear: External ear normal.  Nose: Nose normal.  Mouth/Throat: Oropharynx is clear and moist.  Eyes: Pupils are equal, round, and reactive to light. Conjunctivae and EOM are normal.  Neck: Normal range of motion. Neck supple. No thyromegaly present.  Cardiovascular: Normal rate, regular rhythm and normal heart sounds.  No murmur heard. Pulmonary/Chest: Effort normal and breath sounds normal. No respiratory distress. She has no wheezes. She has no rales.  Abdominal: Soft. Bowel sounds are normal. She exhibits no distension. There is no tenderness.  Lymphadenopathy:    She has no cervical adenopathy.  Neurological: She is alert and oriented to person, place, and time. She has normal reflexes.  Skin: Skin is warm and dry.  Psychiatric: She has a normal mood and affect. Her behavior is normal. Judgment and thought content normal.    Diabetic Foot Exam - Simple   No data filed      Results for orders placed or performed in visit on 03/07/18  CMP14+EGFR  Result Value Ref Range   Glucose 109 (H) 65 - 99 mg/dL   BUN 15 8 - 27 mg/dL   Creatinine, Ser 1.59 (H) 0.57 - 1.00 mg/dL   GFR calc non Af Amer 33 (L) >59 mL/min/1.73   GFR calc Af Amer 38 (L) >59 mL/min/1.73   BUN/Creatinine Ratio 9 (L) 12 - 28   Sodium 144 134 - 144 mmol/L   Potassium 5.3 (H) 3.5 - 5.2 mmol/L   Chloride 103 96 - 106 mmol/L   CO2 26 20 - 29 mmol/L   Calcium 9.4 8.7 - 10.3 mg/dL   Total Protein 6.3 6.0 - 8.5 g/dL   Albumin 3.9 3.6 - 4.8 g/dL   Globulin, Total 2.4 1.5 - 4.5 g/dL   Albumin/Globulin Ratio 1.6 1.2 - 2.2   Bilirubin Total 0.3 0.0 - 1.2 mg/dL   Alkaline Phosphatase 101 39 - 117 IU/L   AST 11 0 - 40 IU/L   ALT 7 0 - 32 IU/L  Lipid panel  Result Value Ref Range   Cholesterol, Total 113 100 - 199 mg/dL   Triglycerides 137 0 - 149  mg/dL   HDL 40 >39 mg/dL   VLDL Cholesterol Cal 27 5 - 40 mg/dL   LDL Calculated 46 0 - 99 mg/dL   Chol/HDL Ratio 2.8 0.0 - 4.4 ratio  VITAMIN D 25 Hydroxy (Vit-D Deficiency, Fractures)  Result Value Ref Range   Vit D, 25-Hydroxy 55.3 30.0 - 100.0 ng/mL  Bayer DCA Hb A1c Waived  Result Value Ref Range   HB A1C (BAYER DCA - WAIVED) 6.0 <7.0 %   0  Assessment & Plan:   Alexa Key was seen today for medical management of chronic issues.  Diagnoses and all orders for this visit:  Diabetes mellitus without complication ([place]) -     Bayer DCA Hb A1c Waived  Pulmonary emphysema, unspecified emphysema type ([facility]) -     CMP14+EGFR -     Lipid panel -     VITAMIN D 25 Hydroxy (Vit-D Deficiency, Fractures) -     Bayer DCA Hb A1c Waived  Vitamin D deficiency -     VITAMIN D 25 Hydroxy (Vit-D Deficiency, Fractures)  Essential hypertension -     isosorbide mononitrate (IMDUR) 30 MG 24 hr tablet; Take 1 tablet (30 mg total) by mouth daily. -     amLODipine (NORVASC) 10 MG  tablet; Take 0.5 tablets (5 mg total) by mouth daily.  Wheezes -     albuterol (PROVENTIL HFA;VENTOLIN HFA) 108 (90 Base) MCG/ACT inhaler; Inhale 2 puffs into the lungs every 6 (six) hours as needed for wheezing or shortness of breath.  Dyslipidemia  Other orders -     Vitamin D, Ergocalciferol, (DRISDOL) 1.25 MG (50000 UT) CAPS capsule; Take 1 capsule (50,000 Units total) by mouth every 7 (seven) days. -     rosuvastatin (CRESTOR) 20 MG tablet; Take 1 tablet (20 mg total) by mouth daily. -     potassium chloride SA (K-DUR,KLOR-CON) 20 MEQ tablet; Take 1 tablet daily For potassium replacement/ supplement -     omeprazole (PRILOSEC) 40 MG capsule; Take 1 capsule (40 mg total) by mouth daily. -     metoprolol succinate (TOPROL-XL) 25 MG 24 hr tablet; Take 1 tablet (25 mg total) by mouth daily. -     glucose blood test strip; Use as instructed -     furosemide (LASIX) 40 MG tablet; Take 1 tablet (40 mg total) by mouth daily.  For swelling -     budesonide-formoterol (SYMBICORT) 160-4.5 MCG/ACT inhaler; Inhale 2 puffs into the lungs 2 (two) times daily. -     albuterol (PROVENTIL) (2.5 MG/3ML) 0.083% nebulizer solution; Take 3 mLs (2.5 mg total) by nebulization every 6 (six) hours as needed for wheezing or shortness of breath.   I have changed [place] Clines. Ng's Vitamin D (Ergocalciferol). I am also having her maintain her aspirin, rosuvastatin, potassium chloride SA, omeprazole, metoprolol succinate, isosorbide mononitrate, glucose blood, furosemide, budesonide-formoterol, amLODipine, albuterol, and albuterol.  Meds ordered this encounter  Medications  . Vitamin D, Ergocalciferol, (DRISDOL) 1.25 MG (50000 UT) CAPS capsule    Sig: Take 1 capsule (50,000 Units total) by mouth every 7 (seven) days.    Dispense:  13 capsule    Refill:  1  . rosuvastatin (CRESTOR) 20 MG tablet    Sig: Take 1 tablet (20 mg total) by mouth daily.    Dispense:  90 tablet    Refill:  1  . potassium chloride SA (K-DUR,KLOR-CON) 20 MEQ tablet    Sig: Take 1 tablet daily For potassium replacement/ supplement    Dispense:  30 tablet    Refill:  5  . omeprazole (PRILOSEC) 40 MG capsule    Sig: Take 1 capsule (40 mg total) by mouth daily.    Dispense:  90 capsule    Refill:  3  . metoprolol succinate (TOPROL-XL) 25 MG 24 hr tablet    Sig: Take 1 tablet (25 mg total) by mouth daily.    Dispense:  90 tablet    Refill:  1  . isosorbide mononitrate (IMDUR) 30 MG 24 hr tablet    Sig: Take 1 tablet (30 mg total) by mouth daily.    Dispense:  90 tablet    Refill:  1  . glucose blood test strip    Sig: Use as instructed    Dispense:  200 each    Refill:  3  . furosemide (LASIX) 40 MG tablet    Sig: Take 1 tablet (40 mg total) by mouth daily. For swelling    Dispense:  30 tablet    Refill:  4  . budesonide-formoterol (SYMBICORT) 160-4.5 MCG/ACT inhaler    Sig: Inhale 2 puffs into the lungs 2 (two) times daily.    Dispense:  1 Inhaler     Refill:  6  . amLODipine (NORVASC) 10 MG  tablet    Sig: Take 0.5 tablets (5 mg total) by mouth daily.    Dispense:  90 tablet    Refill:  1  . albuterol (PROVENTIL) (2.5 MG/3ML) 0.083% nebulizer solution    Sig: Take 3 mLs (2.5 mg total) by nebulization every 6 (six) hours as needed for wheezing or shortness of breath.    Dispense:  120 mL    Refill:  3  . albuterol (PROVENTIL HFA;VENTOLIN HFA) 108 (90 Base) MCG/ACT inhaler    Sig: Inhale 2 puffs into the lungs every 6 (six) hours as needed for wheezing or shortness of breath.    Dispense:  1 Inhaler    Refill:  6     Follow-up: Return in about 3 months (around 06/07/2018).  [name], M.D.

## 2018-03-19 DIAGNOSIS — R1030 Lower abdominal pain, unspecified: Secondary | ICD-10-CM | POA: Diagnosis not present

## 2018-03-19 DIAGNOSIS — N200 Calculus of kidney: Secondary | ICD-10-CM | POA: Diagnosis not present

## 2018-03-19 DIAGNOSIS — K529 Noninfective gastroenteritis and colitis, unspecified: Secondary | ICD-10-CM | POA: Diagnosis not present

## 2018-03-19 DIAGNOSIS — R110 Nausea: Secondary | ICD-10-CM | POA: Diagnosis not present

## 2018-04-10 DIAGNOSIS — M6281 Muscle weakness (generalized): Secondary | ICD-10-CM | POA: Diagnosis not present

## 2018-04-10 DIAGNOSIS — R6521 Severe sepsis with septic shock: Secondary | ICD-10-CM | POA: Diagnosis not present

## 2018-04-10 DIAGNOSIS — J449 Chronic obstructive pulmonary disease, unspecified: Secondary | ICD-10-CM | POA: Diagnosis not present

## 2018-05-11 ENCOUNTER — Telehealth: Payer: Self-pay | Admitting: Family Medicine

## 2018-05-11 NOTE — Telephone Encounter (Signed)
Pt is wanting to talk to Dr Livia Snellen nurse about getting a smaller oxygen tank or one that has wheels because the one she has now is hard for her to carry.

## 2018-05-11 NOTE — Telephone Encounter (Signed)
Form filled out and placed on Dr. Livia Snellen desk for signature

## 2018-05-15 NOTE — Telephone Encounter (Signed)
Form faxed to [facility] 

## 2018-05-27 DIAGNOSIS — R0603 Acute respiratory distress: Secondary | ICD-10-CM | POA: Diagnosis not present

## 2018-05-27 DIAGNOSIS — I10 Essential (primary) hypertension: Secondary | ICD-10-CM | POA: Diagnosis not present

## 2018-05-27 DIAGNOSIS — E119 Type 2 diabetes mellitus without complications: Secondary | ICD-10-CM | POA: Diagnosis not present

## 2018-05-27 DIAGNOSIS — Z8673 Personal history of transient ischemic attack (TIA), and cerebral infarction without residual deficits: Secondary | ICD-10-CM | POA: Diagnosis not present

## 2018-05-27 DIAGNOSIS — I639 Cerebral infarction, unspecified: Secondary | ICD-10-CM | POA: Diagnosis not present

## 2018-05-27 DIAGNOSIS — M797 Fibromyalgia: Secondary | ICD-10-CM | POA: Diagnosis not present

## 2018-05-27 DIAGNOSIS — Z880 Allergy status to penicillin: Secondary | ICD-10-CM | POA: Diagnosis not present

## 2018-05-27 DIAGNOSIS — J441 Chronic obstructive pulmonary disease with (acute) exacerbation: Secondary | ICD-10-CM | POA: Diagnosis not present

## 2018-05-27 DIAGNOSIS — Z882 Allergy status to sulfonamides status: Secondary | ICD-10-CM | POA: Diagnosis not present

## 2018-05-27 DIAGNOSIS — I959 Hypotension, unspecified: Secondary | ICD-10-CM | POA: Diagnosis not present

## 2018-05-27 DIAGNOSIS — R4182 Altered mental status, unspecified: Secondary | ICD-10-CM | POA: Diagnosis not present

## 2018-05-28 DIAGNOSIS — I6521 Occlusion and stenosis of right carotid artery: Secondary | ICD-10-CM | POA: Diagnosis not present

## 2018-05-28 DIAGNOSIS — Z79899 Other long term (current) drug therapy: Secondary | ICD-10-CM | POA: Diagnosis not present

## 2018-05-28 DIAGNOSIS — Z8673 Personal history of transient ischemic attack (TIA), and cerebral infarction without residual deficits: Secondary | ICD-10-CM | POA: Diagnosis not present

## 2018-05-28 DIAGNOSIS — I2510 Atherosclerotic heart disease of native coronary artery without angina pectoris: Secondary | ICD-10-CM | POA: Diagnosis not present

## 2018-05-28 DIAGNOSIS — I5032 Chronic diastolic (congestive) heart failure: Secondary | ICD-10-CM | POA: Diagnosis not present

## 2018-05-28 DIAGNOSIS — I671 Cerebral aneurysm, nonruptured: Secondary | ICD-10-CM | POA: Diagnosis not present

## 2018-05-28 DIAGNOSIS — G8104 Flaccid hemiplegia affecting left nondominant side: Secondary | ICD-10-CM | POA: Diagnosis not present

## 2018-05-28 DIAGNOSIS — I6389 Other cerebral infarction: Secondary | ICD-10-CM | POA: Diagnosis not present

## 2018-05-28 DIAGNOSIS — N183 Chronic kidney disease, stage 3 (moderate): Secondary | ICD-10-CM | POA: Diagnosis not present

## 2018-05-28 DIAGNOSIS — Z4682 Encounter for fitting and adjustment of non-vascular catheter: Secondary | ICD-10-CM | POA: Diagnosis not present

## 2018-05-28 DIAGNOSIS — F17200 Nicotine dependence, unspecified, uncomplicated: Secondary | ICD-10-CM | POA: Diagnosis not present

## 2018-05-28 DIAGNOSIS — N179 Acute kidney failure, unspecified: Secondary | ICD-10-CM | POA: Diagnosis not present

## 2018-05-28 DIAGNOSIS — J9622 Acute and chronic respiratory failure with hypercapnia: Secondary | ICD-10-CM | POA: Diagnosis not present

## 2018-05-28 DIAGNOSIS — I63233 Cerebral infarction due to unspecified occlusion or stenosis of bilateral carotid arteries: Secondary | ICD-10-CM | POA: Diagnosis not present

## 2018-05-28 DIAGNOSIS — I517 Cardiomegaly: Secondary | ICD-10-CM | POA: Diagnosis not present

## 2018-05-28 DIAGNOSIS — G9340 Encephalopathy, unspecified: Secondary | ICD-10-CM | POA: Diagnosis not present

## 2018-05-28 DIAGNOSIS — Z515 Encounter for palliative care: Secondary | ICD-10-CM | POA: Diagnosis not present

## 2018-05-28 DIAGNOSIS — I63231 Cerebral infarction due to unspecified occlusion or stenosis of right carotid arteries: Secondary | ICD-10-CM | POA: Diagnosis not present

## 2018-05-28 DIAGNOSIS — J441 Chronic obstructive pulmonary disease with (acute) exacerbation: Secondary | ICD-10-CM | POA: Diagnosis not present

## 2018-05-28 DIAGNOSIS — R278 Other lack of coordination: Secondary | ICD-10-CM | POA: Diagnosis not present

## 2018-05-28 DIAGNOSIS — E1122 Type 2 diabetes mellitus with diabetic chronic kidney disease: Secondary | ICD-10-CM | POA: Diagnosis not present

## 2018-05-28 DIAGNOSIS — J9811 Atelectasis: Secondary | ICD-10-CM | POA: Diagnosis not present

## 2018-05-28 DIAGNOSIS — R29712 NIHSS score 12: Secondary | ICD-10-CM | POA: Diagnosis not present

## 2018-05-28 DIAGNOSIS — I10 Essential (primary) hypertension: Secondary | ICD-10-CM | POA: Diagnosis not present

## 2018-05-28 DIAGNOSIS — R0989 Other specified symptoms and signs involving the circulatory and respiratory systems: Secondary | ICD-10-CM | POA: Diagnosis not present

## 2018-05-28 DIAGNOSIS — R0602 Shortness of breath: Secondary | ICD-10-CM | POA: Diagnosis not present

## 2018-05-28 DIAGNOSIS — E785 Hyperlipidemia, unspecified: Secondary | ICD-10-CM | POA: Diagnosis not present

## 2018-05-28 DIAGNOSIS — Z720 Tobacco use: Secondary | ICD-10-CM | POA: Diagnosis not present

## 2018-05-28 DIAGNOSIS — I080 Rheumatic disorders of both mitral and aortic valves: Secondary | ICD-10-CM | POA: Diagnosis not present

## 2018-05-28 DIAGNOSIS — E119 Type 2 diabetes mellitus without complications: Secondary | ICD-10-CM | POA: Diagnosis not present

## 2018-05-28 DIAGNOSIS — I130 Hypertensive heart and chronic kidney disease with heart failure and stage 1 through stage 4 chronic kidney disease, or unspecified chronic kidney disease: Secondary | ICD-10-CM | POA: Diagnosis not present

## 2018-05-28 DIAGNOSIS — J9621 Acute and chronic respiratory failure with hypoxia: Secondary | ICD-10-CM | POA: Diagnosis not present

## 2018-05-28 DIAGNOSIS — R918 Other nonspecific abnormal finding of lung field: Secondary | ICD-10-CM | POA: Diagnosis not present

## 2018-05-28 DIAGNOSIS — I639 Cerebral infarction, unspecified: Secondary | ICD-10-CM | POA: Diagnosis not present

## 2018-05-28 DIAGNOSIS — F419 Anxiety disorder, unspecified: Secondary | ICD-10-CM | POA: Diagnosis not present

## 2018-05-28 DIAGNOSIS — J90 Pleural effusion, not elsewhere classified: Secondary | ICD-10-CM | POA: Diagnosis not present

## 2018-05-28 DIAGNOSIS — Z7189 Other specified counseling: Secondary | ICD-10-CM | POA: Diagnosis not present

## 2018-05-28 DIAGNOSIS — J181 Lobar pneumonia, unspecified organism: Secondary | ICD-10-CM | POA: Diagnosis not present

## 2018-05-28 DIAGNOSIS — J440 Chronic obstructive pulmonary disease with acute lower respiratory infection: Secondary | ICD-10-CM | POA: Diagnosis not present

## 2018-05-28 DIAGNOSIS — J9690 Respiratory failure, unspecified, unspecified whether with hypoxia or hypercapnia: Secondary | ICD-10-CM | POA: Diagnosis not present

## 2018-05-28 DIAGNOSIS — J189 Pneumonia, unspecified organism: Secondary | ICD-10-CM | POA: Diagnosis not present

## 2018-05-28 DIAGNOSIS — I7090 Unspecified atherosclerosis: Secondary | ICD-10-CM | POA: Diagnosis not present

## 2018-05-28 DIAGNOSIS — I519 Heart disease, unspecified: Secondary | ICD-10-CM | POA: Diagnosis not present

## 2018-05-28 DIAGNOSIS — J439 Emphysema, unspecified: Secondary | ICD-10-CM | POA: Diagnosis not present

## 2018-05-28 DIAGNOSIS — R29818 Other symptoms and signs involving the nervous system: Secondary | ICD-10-CM | POA: Diagnosis not present

## 2018-05-28 DIAGNOSIS — Z66 Do not resuscitate: Secondary | ICD-10-CM | POA: Diagnosis not present

## 2018-05-28 DIAGNOSIS — I160 Hypertensive urgency: Secondary | ICD-10-CM | POA: Diagnosis not present

## 2018-05-28 DIAGNOSIS — Z9911 Dependence on respirator [ventilator] status: Secondary | ICD-10-CM | POA: Diagnosis not present

## 2018-05-28 DIAGNOSIS — Z743 Need for continuous supervision: Secondary | ICD-10-CM | POA: Diagnosis not present

## 2018-05-28 DIAGNOSIS — I63532 Cerebral infarction due to unspecified occlusion or stenosis of left posterior cerebral artery: Secondary | ICD-10-CM | POA: Diagnosis not present

## 2018-05-28 DIAGNOSIS — Z902 Acquired absence of lung [part of]: Secondary | ICD-10-CM | POA: Diagnosis not present

## 2018-05-28 DIAGNOSIS — E870 Hyperosmolality and hypernatremia: Secondary | ICD-10-CM | POA: Diagnosis not present

## 2018-06-08 ENCOUNTER — Ambulatory Visit: Payer: Medicare HMO | Admitting: Family Medicine

## 2018-07-01 DEATH — deceased
